# Patient Record
Sex: Male | Born: 1935 | Race: Black or African American | Hispanic: No | Smoking: Former smoker
Health system: Southern US, Community
[De-identification: ages and names within clinical notes are randomized; demographics above are authoritative.]

## PROBLEM LIST (undated history)

## (undated) DIAGNOSIS — H5440 Blindness, one eye, unspecified eye: Secondary | ICD-10-CM

## (undated) DIAGNOSIS — I10 Essential (primary) hypertension: Secondary | ICD-10-CM

## (undated) DIAGNOSIS — M1990 Unspecified osteoarthritis, unspecified site: Secondary | ICD-10-CM

## (undated) HISTORY — PX: OTHER SURGICAL HISTORY: SHX169

## (undated) HISTORY — PX: CARPAL TUNNEL RELEASE: SHX101

## (undated) HISTORY — PX: FINGER AMPUTATION: SHX636

---

## 2001-10-17 ENCOUNTER — Ambulatory Visit (HOSPITAL_COMMUNITY): Admission: RE | Admit: 2001-10-17 | Discharge: 2001-10-17 | Payer: Self-pay | Admitting: Pulmonary Disease

## 2003-02-12 ENCOUNTER — Ambulatory Visit (HOSPITAL_COMMUNITY): Admission: RE | Admit: 2003-02-12 | Discharge: 2003-02-12 | Payer: Self-pay | Admitting: General Surgery

## 2006-08-05 ENCOUNTER — Ambulatory Visit (HOSPITAL_COMMUNITY): Admission: RE | Admit: 2006-08-05 | Discharge: 2006-08-05 | Payer: Self-pay | Admitting: Family Medicine

## 2013-06-12 ENCOUNTER — Other Ambulatory Visit (HOSPITAL_COMMUNITY): Payer: Self-pay | Admitting: Family Medicine

## 2013-06-12 ENCOUNTER — Ambulatory Visit (HOSPITAL_COMMUNITY)
Admission: RE | Admit: 2013-06-12 | Discharge: 2013-06-12 | Disposition: A | Discharge status: Home / Self Care | Payer: Medicare Other | Source: Ambulatory Visit | Attending: Family Medicine | Admitting: Family Medicine

## 2013-06-12 DIAGNOSIS — I10 Essential (primary) hypertension: Secondary | ICD-10-CM | POA: Insufficient documentation

## 2013-06-12 DIAGNOSIS — Z87891 Personal history of nicotine dependence: Secondary | ICD-10-CM

## 2013-06-26 ENCOUNTER — Encounter (INDEPENDENT_AMBULATORY_CARE_PROVIDER_SITE_OTHER): Payer: Self-pay | Admitting: *Deleted

## 2013-07-11 ENCOUNTER — Other Ambulatory Visit (INDEPENDENT_AMBULATORY_CARE_PROVIDER_SITE_OTHER): Payer: Self-pay | Admitting: *Deleted

## 2013-07-11 ENCOUNTER — Telehealth (INDEPENDENT_AMBULATORY_CARE_PROVIDER_SITE_OTHER): Payer: Self-pay | Admitting: *Deleted

## 2013-07-11 ENCOUNTER — Encounter (INDEPENDENT_AMBULATORY_CARE_PROVIDER_SITE_OTHER): Payer: Self-pay

## 2013-07-11 ENCOUNTER — Encounter (INDEPENDENT_AMBULATORY_CARE_PROVIDER_SITE_OTHER): Payer: Self-pay | Admitting: *Deleted

## 2013-07-11 DIAGNOSIS — Z1211 Encounter for screening for malignant neoplasm of colon: Secondary | ICD-10-CM

## 2013-07-11 MED ORDER — PEG-KCL-NACL-NASULF-NA ASC-C 100 G PO SOLR: 1.0000 | Freq: Once | ORAL | Status: DC

## 2013-07-11 NOTE — Telephone Encounter (Signed)
Patient needs movi prep 

## 2013-08-27 ENCOUNTER — Telehealth (INDEPENDENT_AMBULATORY_CARE_PROVIDER_SITE_OTHER): Payer: Self-pay | Admitting: *Deleted

## 2013-08-27 NOTE — Telephone Encounter (Signed)
  Procedure: tcs  Reason/Indication:  screening  Has patient had this procedure before?  Yes, 10 yrs ago -- scanned  If so, when, by whom and where?    Is there a family history of colon cancer?  Not sure  Who?  What age when diagnosed?    Is patient diabetic?   no      Does patient have prosthetic heart valve?  no  Do you have a pacemaker?  no  Has patient ever had endocarditis? no  Has patient had joint replacement within last 12 months?  no  Does patient tend to be constipated or take laxatives? no  Is patient on Coumadin, Plavix and/or Aspirin? yes  Medications: asa 81 mg daily, lisinopril 20 mg daily, hctz 12.5 mg daily  Allergies: nkda  Medication Adjustment: asa 2 days  Procedure date & time: 09/19/13 at 830

## 2013-08-28 NOTE — Telephone Encounter (Signed)
agree

## 2013-09-06 ENCOUNTER — Encounter (HOSPITAL_COMMUNITY): Payer: Self-pay

## 2013-09-19 ENCOUNTER — Ambulatory Visit (HOSPITAL_COMMUNITY)
Admission: RE | Admit: 2013-09-19 | Discharge: 2013-09-19 | Disposition: A | Discharge status: Home / Self Care | Payer: Medicare Other | Source: Ambulatory Visit | Attending: Internal Medicine | Admitting: Internal Medicine

## 2013-09-19 ENCOUNTER — Encounter (HOSPITAL_COMMUNITY)
Admission: RE | Disposition: A | Discharge status: Home / Self Care | Payer: Self-pay | Source: Ambulatory Visit | Attending: Internal Medicine

## 2013-09-19 ENCOUNTER — Encounter (HOSPITAL_COMMUNITY): Payer: Self-pay | Admitting: *Deleted

## 2013-09-19 DIAGNOSIS — K644 Residual hemorrhoidal skin tags: Secondary | ICD-10-CM

## 2013-09-19 DIAGNOSIS — Z1211 Encounter for screening for malignant neoplasm of colon: Secondary | ICD-10-CM

## 2013-09-19 DIAGNOSIS — Z7982 Long term (current) use of aspirin: Secondary | ICD-10-CM | POA: Insufficient documentation

## 2013-09-19 DIAGNOSIS — Z79899 Other long term (current) drug therapy: Secondary | ICD-10-CM | POA: Insufficient documentation

## 2013-09-19 DIAGNOSIS — I10 Essential (primary) hypertension: Secondary | ICD-10-CM | POA: Insufficient documentation

## 2013-09-19 HISTORY — DX: Blindness, one eye, unspecified eye: H54.40

## 2013-09-19 HISTORY — PX: COLONOSCOPY: SHX5424

## 2013-09-19 HISTORY — DX: Unspecified osteoarthritis, unspecified site: M19.90

## 2013-09-19 HISTORY — DX: Essential (primary) hypertension: I10

## 2013-09-19 SURGERY — COLONOSCOPY
Anesthesia: Moderate Sedation

## 2013-09-19 MED ORDER — MIDAZOLAM HCL 5 MG/5ML IJ SOLN
INTRAMUSCULAR | Status: AC
  Filled 2013-09-19: qty 10

## 2013-09-19 MED ORDER — ATROPINE SULFATE 0.1 MG/ML IJ SOLN
INTRAMUSCULAR | Status: DC | PRN
  Administered 2013-09-19: 0.5 mg via INTRAVENOUS

## 2013-09-19 MED ORDER — MEPERIDINE HCL 50 MG/ML IJ SOLN
INTRAMUSCULAR | Status: AC
  Filled 2013-09-19: qty 1

## 2013-09-19 MED ORDER — MEPERIDINE HCL 50 MG/ML IJ SOLN
INTRAMUSCULAR | Status: DC | PRN
  Administered 2013-09-19: 25 mg via INTRAVENOUS

## 2013-09-19 MED ORDER — SODIUM CHLORIDE 0.9 % IV SOLN
INTRAVENOUS | Status: DC
  Administered 2013-09-19: 1000 mL via INTRAVENOUS

## 2013-09-19 MED ORDER — MIDAZOLAM HCL 5 MG/5ML IJ SOLN
INTRAMUSCULAR | Status: DC | PRN
  Administered 2013-09-19: 2 mg via INTRAVENOUS
  Administered 2013-09-19: 1 mg via INTRAVENOUS

## 2013-09-19 MED ORDER — SIMETHICONE 40 MG/0.6ML PO SUSP
ORAL | Status: DC | PRN
  Administered 2013-09-19: 09:00:00

## 2013-09-19 MED ORDER — ATROPINE SULFATE 1 MG/ML IJ SOLN
INTRAMUSCULAR | Status: AC
  Filled 2013-09-19: qty 1

## 2013-09-19 NOTE — Op Note (Signed)
COLONOSCOPY PROCEDURE REPORT  PATIENT:  Dustin Tucker  MR#:  [id number] Birthdate:  October 22, 1931, 80 y.o., male Endoscopist:  Dr. Rogene Houston, MD Referred By:  Dr. Starr Sinclair. Everette Rank, MD Procedure Date: 09/19/2013  Procedure:   Colonoscopy  Indications:  Patient is 80 year old African American male who is undergoing average risk screening colonoscopy. Last colonoscopy was in July, 2004.  Informed Consent:  The procedure and risks were reviewed with the patient and informed consent was obtained.  Medications:  Demerol 25 mg IV Versed 3 mg IV Atropine 0.5 mg IV for asymptomatic sinus bradycardia.  Description of procedure:  After a digital rectal exam was performed, that colonoscope was advanced from the anus through the rectum and colon to the area of the cecum, ileocecal valve and appendiceal orifice. The cecum was deeply intubated. These structures were well-seen and photographed for the record. From the level of the cecum and ileocecal valve, the scope was slowly and cautiously withdrawn. The mucosal surfaces were carefully surveyed utilizing scope tip to flexion to facilitate fold flattening as needed. The scope was pulled down into the rectum where a thorough exam including retroflexion was performed.  Findings:   Prep excellent. Normal mucosa of Fabius segments of colon and rectum. Small hemorrhoids below the dentate line.    Therapeutic/Diagnostic Maneuvers Performed:   None  Complications:  None  Cecal Withdrawal Time: 59minutes  Impression:  Normal colonoscopy except external hemorrhoids.  Recommendations:  Standard instructions given.    REHMAN,NAJEEB U  09/19/2013 9:07 AM  CC: Dr. Lanette Hampshire, MD & Dr. Rayne Du ref. provider found

## 2013-09-19 NOTE — Discharge Instructions (Signed)
Resume usual medications and diet. No driving for 24 hours.   Colonoscopy A colonoscopy is an exam to look at the entire large intestine (colon). This exam can help find problems such as tumors, polyps, inflammation, and areas of bleeding. The exam takes about 1 hour.  LET [facility] CARE PROVIDER KNOW ABOUT:   Any allergies you have.  All medicines you are taking, including vitamins, herbs, eye drops, creams, and over-the-counter medicines.  Previous problems you or members of your family have had with the use of anesthetics.  Any blood disorders you have.  Previous surgeries you have had.  Medical conditions you have. RISKS AND COMPLICATIONS  Generally, this is a safe procedure. However, as with any procedure, complications can occur. Possible complications include:  Bleeding.  Tearing or rupture of the colon wall.  Reaction to medicines given during the exam.  Infection (rare). BEFORE THE PROCEDURE   Ask your health care provider about changing or stopping your regular medicines.  You may be prescribed an oral bowel prep. This involves drinking a large amount of medicated liquid, starting the day before your procedure. The liquid will cause you to have multiple loose stools until your stool is almost clear or light green. This cleans out your colon in preparation for the procedure.  Do not eat or drink anything else once you have started the bowel prep, unless your health care provider tells you it is safe to do so.  Arrange for someone to drive you home after the procedure. PROCEDURE   You will be given medicine to help you relax (sedative).  You will lie on your side with your knees bent.  A long, flexible tube with a light and camera on the end (colonoscope) will be inserted through the rectum and into the colon. The camera sends video back to a computer screen as it moves through the colon. The colonoscope also releases carbon dioxide gas to inflate the colon. This  helps your health care provider see the area better.  During the exam, your health care provider may take a small tissue sample (biopsy) to be examined under a microscope if any abnormalities are found.  The exam is finished when the entire colon has been viewed. AFTER THE PROCEDURE   Do not drive for 24 hours after the exam.  You may have a small amount of blood in your stool.  You may pass moderate amounts of gas and have mild abdominal cramping or bloating. This is caused by the gas used to inflate your colon during the exam.  Ask when your test results will be ready and how you will get your results. Make sure you get your test results. Document Released: 07/23/2000 Document Revised: 05/16/2013 Document Reviewed: 04/02/2013 [facility] Patient Information 2014 [place].   Hemorrhoids Hemorrhoids are swollen veins around the rectum or anus. There are two types of hemorrhoids:   Internal hemorrhoids. These occur in the veins just inside the rectum. They may poke through to the outside and become irritated and painful.  External hemorrhoids. These occur in the veins outside the anus and can be felt as a painful swelling or hard lump near the anus. CAUSES  Pregnancy.   Obesity.   Constipation or diarrhea.   Straining to have a bowel movement.   Sitting for long periods on the toilet.  Heavy lifting or other activity that caused you to strain.  Anal intercourse. SYMPTOMS   Pain.   Anal itching or irritation.   Rectal bleeding.  Fecal leakage.   Anal swelling.   One or more lumps around the anus.  DIAGNOSIS  Your caregiver may be able to diagnose hemorrhoids by visual examination. Other examinations or tests that may be performed include:   Examination of the rectal area with a gloved hand (digital rectal exam).   Examination of anal canal using a small tube (scope).   A blood test if you have lost a significant amount of blood.  A test to  look inside the colon (sigmoidoscopy or colonoscopy). TREATMENT Most hemorrhoids can be treated at home. However, if symptoms do not seem to be getting better or if you have a lot of rectal bleeding, your caregiver may perform a procedure to help make the hemorrhoids get smaller or remove them completely. Possible treatments include:   Placing a rubber band at the base of the hemorrhoid to cut off the circulation (rubber band ligation).   Injecting a chemical to shrink the hemorrhoid (sclerotherapy).   Using a tool to burn the hemorrhoid (infrared light therapy).   Surgically removing the hemorrhoid (hemorrhoidectomy).   Stapling the hemorrhoid to block blood flow to the tissue (hemorrhoid stapling).  HOME CARE INSTRUCTIONS   Eat foods with fiber, such as whole grains, beans, nuts, fruits, and vegetables. Ask your doctor about taking products with added fiber in them (fibersupplements).  Increase fluid intake. Drink enough water and fluids to keep your urine clear or pale yellow.   Exercise regularly.   Go to the bathroom when you have the urge to have a bowel movement. Do not wait.   Avoid straining to have bowel movements.   Keep the anal area dry and clean. Use wet toilet paper or moist towelettes after a bowel movement.   Medicated creams and suppositories may be used or applied as directed.   Only take over-the-counter or prescription medicines as directed by your caregiver.   Take warm sitz baths for 15 20 minutes, 3 4 times a day to ease pain and discomfort.   Place ice packs on the hemorrhoids if they are tender and swollen. Using ice packs between sitz baths may be helpful.   Put ice in a plastic bag.   Place a towel between your skin and the bag.   Leave the ice on for 15 20 minutes, 3 4 times a day.   Do not use a donut-shaped pillow or sit on the toilet for long periods. This increases blood pooling and pain.  SEEK MEDICAL CARE IF:  You have  increasing pain and swelling that is not controlled by treatment or medicine.  You have uncontrolled bleeding.  You have difficulty or you are unable to have a bowel movement.  You have pain or inflammation outside the area of the hemorrhoids. MAKE SURE YOU:  Understand these instructions.  Will watch your condition.  Will get help right away if you are not doing well or get worse. Document Released: 07/23/2000 Document Revised: 07/12/2012 Document Reviewed: 05/30/2012 [facility] Patient Information 2014 [place].

## 2013-09-19 NOTE — H&P (Signed)
Dustin Tucker is an 80 y.o. male.   Chief Complaint: Patient is here for colonoscopy. HPI: Patient is 80 year old African American male who is here for screening colonoscopy. He denies change in bowel habits rectal bleeding or abdominal pain. His last colonoscopy was in July 2004. Family history is positive for prostate carcinoma and one brother who is in his late 72s another brother was diagnosed with metastatic colon cancer but no further details available.  Past Medical History  Diagnosis Date  . Hypertension   . Arthritis     hands  . 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 Carpal tunnel release Right   . Finger amputation Left     4th and 5th  . Left eye blindness Left     Family History  Problem Relation Age of Onset  . CAD Mother   . CAD Father    Social History:  reports that he quit smoking about 34 years ago. His smoking use included Cigarettes. He smoked 0.50 packs per day. He does not have any smokeless tobacco history on file. He reports that he does not drink alcohol or use illicit drugs.  Allergies:  Allergies  Allergen Reactions  . Aspirin Other (See Comments)    Gi upset, can tolerate baby aspirin    Medications Prior to Admission  Medication Sig Dispense Refill  . aspirin EC 81 MG tablet Take 81 mg by mouth daily.      . hydrochlorothiazide (MICROZIDE) 12.5 MG capsule Take 12.5 mg by mouth daily.      Marland Kitchen lisinopril (PRINIVIL,ZESTRIL) 20 MG tablet Take 20 mg by mouth daily.        No results found for this or any previous visit (from the past 48 hour(s)). No results found.  ROS  Blood pressure 185/88, pulse 50, temperature 97.5 F (36.4 C), temperature source Oral, resp. rate 18, height 5\' 9"  (1.753 m), weight 172 lb (78.019 kg), SpO2 97.00%. Physical Exam  Constitutional: He appears well-developed and well-nourished.  HENT:  Mouth/Throat: Oropharynx is clear and moist.  Eyes: No scleral  icterus.  Patient is blind in left eye  Neck: No thyromegaly present.  Cardiovascular: Normal rate, regular rhythm and normal heart sounds.   No murmur heard. Respiratory: Effort normal and breath sounds normal.  GI: Soft. He exhibits no distension and no mass. There is no tenderness.  Musculoskeletal: He exhibits no edema.  Fourth and fifth finger in left hand have been amputated  Lymphadenopathy:    He has no cervical adenopathy.  Neurological: He is alert.  Skin: Skin is warm and dry.     Assessment/Plan Average risk screening colonoscopy.  [name] U 09/19/2013, 8:32 AM

## 2013-09-21 ENCOUNTER — Encounter (HOSPITAL_COMMUNITY): Payer: Self-pay | Admitting: Internal Medicine

## 2013-10-22 ENCOUNTER — Other Ambulatory Visit (HOSPITAL_COMMUNITY): Payer: Self-pay | Admitting: Family Medicine

## 2013-10-22 DIAGNOSIS — Z136 Encounter for screening for cardiovascular disorders: Secondary | ICD-10-CM

## 2013-10-22 DIAGNOSIS — I10 Essential (primary) hypertension: Secondary | ICD-10-CM

## 2013-10-26 ENCOUNTER — Ambulatory Visit (HOSPITAL_COMMUNITY)
Admission: RE | Admit: 2013-10-26 | Discharge: 2013-10-26 | Disposition: A | Discharge status: Home / Self Care | Payer: Medicare Other | Source: Ambulatory Visit | Attending: Family Medicine | Admitting: Family Medicine

## 2013-10-26 DIAGNOSIS — Z136 Encounter for screening for cardiovascular disorders: Secondary | ICD-10-CM

## 2013-10-26 DIAGNOSIS — I10 Essential (primary) hypertension: Secondary | ICD-10-CM

## 2013-10-26 DIAGNOSIS — Z1389 Encounter for screening for other disorder: Secondary | ICD-10-CM | POA: Insufficient documentation

## 2014-06-11 ENCOUNTER — Ambulatory Visit (INDEPENDENT_AMBULATORY_CARE_PROVIDER_SITE_OTHER): Payer: Medicare Other | Admitting: Urology

## 2014-06-11 DIAGNOSIS — S2096XA Insect bite (nonvenomous) of unspecified parts of thorax, initial encounter: Secondary | ICD-10-CM

## 2014-06-11 DIAGNOSIS — N529 Male erectile dysfunction, unspecified: Secondary | ICD-10-CM

## 2014-06-11 DIAGNOSIS — R972 Elevated prostate specific antigen [PSA]: Secondary | ICD-10-CM

## 2014-06-28 ENCOUNTER — Other Ambulatory Visit: Payer: Self-pay | Admitting: Urology

## 2014-06-28 DIAGNOSIS — R972 Elevated prostate specific antigen [PSA]: Secondary | ICD-10-CM

## 2014-08-02 ENCOUNTER — Other Ambulatory Visit: Payer: Self-pay | Admitting: Urology

## 2014-08-02 DIAGNOSIS — R972 Elevated prostate specific antigen [PSA]: Secondary | ICD-10-CM

## 2014-08-13 ENCOUNTER — Ambulatory Visit (HOSPITAL_COMMUNITY)
Admission: RE | Admit: 2014-08-13 | Discharge: 2014-08-13 | Disposition: A | Discharge status: Home / Self Care | Payer: Medicare Other | Source: Ambulatory Visit | Attending: Urology | Admitting: Urology

## 2014-08-13 DIAGNOSIS — R972 Elevated prostate specific antigen [PSA]: Secondary | ICD-10-CM | POA: Insufficient documentation

## 2014-08-13 MED ORDER — LIDOCAINE HCL (PF) 2 % IJ SOLN: 10.0000 mL | Freq: Once | INTRAMUSCULAR | Status: DC

## 2014-08-13 MED ORDER — GENTAMICIN SULFATE 40 MG/ML IJ SOLN
160.0000 mg | Freq: Once | INTRAMUSCULAR | Status: AC
  Administered 2014-08-13: 160 mg via INTRAMUSCULAR

## 2014-08-13 MED ORDER — LIDOCAINE HCL (PF) 2 % IJ SOLN
INTRAMUSCULAR | Status: AC
  Filled 2014-08-13: qty 10

## 2014-08-13 MED ORDER — GENTAMICIN SULFATE 40 MG/ML IJ SOLN
INTRAMUSCULAR | Status: AC
Start: 2014-08-13 — End: 2014-08-13
  Administered 2014-08-13: 160 mg via INTRAMUSCULAR
  Filled 2014-08-13: qty 4

## 2014-08-13 NOTE — Discharge Instructions (Signed)
Transrectal Ultrasound-Guided Biopsy °A transrectal ultrasound-guided biopsy is a procedure to remove samples of tissue from your prostate using ultrasound images to guide the procedure. The procedure is usually done to evaluate the prostate gland of men who have an elevated prostate-specific antigen (PSA). PSA is a blood test to screen for prostate cancer. The biopsy samples are taken to check for prostate cancer.  °LET YOUR HEALTH CARE PROVIDER KNOW ABOUT: °· Any allergies you have. °· All medicines you are taking, including vitamins, herbs, eye drops, creams, and over-the-counter medicines. °· Previous problems you or members of your family have had with the use of anesthetics. °· Any blood disorders you have. °· Previous surgeries you have had. °· Medical conditions you have. °RISKS AND COMPLICATIONS °Generally, this is a safe procedure. However, as with any procedure, problems can occur. Possible problems include: °· Infection of your prostate. °· Bleeding from your rectum or blood in your urine. °· Difficulty urinating. °· Nerve damage (this is usually temporary). °· Damage to surrounding structures such as blood vessels, organs, and muscles, which would require other procedures. °BEFORE THE PROCEDURE °· Do not eat or drink anything after midnight on the night before the procedure or as directed by your health care provider. °· Take medicines only as directed by your health care provider. °· Your health care provider may have you stop taking certain medicines 5-7 days before the procedure. °· You will be given an enema before the procedure. During an enema, a liquid is injected into your rectum to clear out waste. °· You may have lab tests the day of your procedure.   °· Plan to have someone take you home after the procedure. °PROCEDURE  °· You will be given medicine to help you relax (sedative) before the procedure. An IV tube will be inserted into one of your veins and used to give fluids and  medicine. °· You will be given antibiotic medicine to reduce the risk of an infection. °· You will be placed on your side for the procedure. °· A probe with lubricated gel will be placed into your rectum, and images will be taken of your prostate and surrounding structures. °· Numbing medicine will be injected into the prostate before the biopsy samples are taken. °· A biopsy needle will then be inserted and guided to your prostate with the use of the ultrasound images. °· Samples of prostate tissue will be taken, and the needle will then be removed. °· The biopsy samples will be sent to a lab to be analyzed. Results are usually back in 2-3 days. °AFTER THE PROCEDURE °· You will be taken to a recovery area where you will be monitored. °· You may have some discomfort in the rectal area. You will be given pain medicines to control this. °· You may be allowed to go home the same day, or you may need to stay in the hospital overnight. °Document Released: 12/10/2013 Document Reviewed: 03/14/2013 °[employer]® Patient Information ©2015 [employer]. This information is not intended to replace advice given to you by your health care provider. Make sure you discuss any questions you have with your health care provider. ° °

## 2014-08-24 ENCOUNTER — Encounter (HOSPITAL_COMMUNITY): Payer: Self-pay | Admitting: *Deleted

## 2014-08-24 ENCOUNTER — Emergency Department (HOSPITAL_COMMUNITY)
Admission: EM | Admit: 2014-08-24 | Discharge: 2014-08-24 | Disposition: A | Discharge status: Home / Self Care | Payer: Medicare Other | Attending: Emergency Medicine | Admitting: Emergency Medicine

## 2014-08-24 DIAGNOSIS — R338 Other retention of urine: Secondary | ICD-10-CM

## 2014-08-24 DIAGNOSIS — Z79899 Other long term (current) drug therapy: Secondary | ICD-10-CM | POA: Diagnosis not present

## 2014-08-24 DIAGNOSIS — R339 Retention of urine, unspecified: Secondary | ICD-10-CM | POA: Diagnosis present

## 2014-08-24 DIAGNOSIS — N4889 Other specified disorders of penis: Secondary | ICD-10-CM | POA: Insufficient documentation

## 2014-08-24 DIAGNOSIS — H5442 Blindness, left eye, normal vision right eye: Secondary | ICD-10-CM | POA: Diagnosis not present

## 2014-08-24 DIAGNOSIS — I10 Essential (primary) hypertension: Secondary | ICD-10-CM | POA: Diagnosis not present

## 2014-08-24 DIAGNOSIS — M1990 Unspecified osteoarthritis, unspecified site: Secondary | ICD-10-CM | POA: Diagnosis not present

## 2014-08-24 DIAGNOSIS — Z87891 Personal history of nicotine dependence: Secondary | ICD-10-CM | POA: Diagnosis not present

## 2014-08-24 DIAGNOSIS — Z7982 Long term (current) use of aspirin: Secondary | ICD-10-CM | POA: Insufficient documentation

## 2014-08-24 LAB — URINALYSIS, ROUTINE W REFLEX MICROSCOPIC
Bilirubin Urine: NEGATIVE
Glucose, UA: NEGATIVE mg/dL
Ketones, ur: NEGATIVE mg/dL
Nitrite: NEGATIVE
Protein, ur: NEGATIVE mg/dL
SPECIFIC GRAVITY, URINE: 1.015 (ref 1.005–1.030)
Urobilinogen, UA: 0.2 mg/dL (ref 0.0–1.0)
pH: 6 (ref 5.0–8.0)

## 2014-08-24 LAB — URINE MICROSCOPIC-ADD ON

## 2014-08-24 NOTE — ED Provider Notes (Signed)
CSN: [id number]     Arrival date & time 08/24/14  1615 History  This chart was scribed for Dustin Muskrat, MD by [facility], ED Scribe. The patient was seen in APA01/[facility]01 and the patient's care was started at 4:59 PM.  Chief Complaint  Patient presents with  . Urinary Retention   The history is provided by the patient. No language interpreter was used.    HPI Comments: TREMONT GAVITT is a 81 y.o. male who presents to the Emergency Department complaining of urinary retention onset last night. Pt reports he has only been able to void small amounts of urine. When he does void he notes it is more blood than urine. He has penile pain as associated symptoms. Had a prostate biopsy test 2 weeks ago. He denies CP, SOB, confusion, syncope, abdominal pain, fever, weakness, and dizziness. Pt is otherwise healthy.   Past Medical History  Diagnosis Date  . Hypertension   . Arthritis     hands  . 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 Carpal tunnel release Right   . Finger amputation Left     4th and 5th  . Left eye blindness Left   . Colonoscopy N/A 09/19/2013    Procedure: COLONOSCOPY;  Surgeon: Rogene Houston, MD;  Location: [facility];  Service: Endoscopy;  Laterality: N/A;  8   Family History  Problem Relation Age of Onset  . CAD Mother   . CAD Father    History  Substance Use Topics  . Smoking status: Former Smoker -- 0.50 packs/day    Types: Cigarettes    Quit date: 08/10/1979  . Smokeless tobacco: Not on file  . Alcohol Use: No    Review of Systems  Constitutional: Negative for fever.       Per HPI, otherwise negative  HENT:       Per HPI, otherwise negative  Respiratory: Negative for shortness of breath.        Per HPI, otherwise negative  Cardiovascular: Negative for chest pain.       Per HPI, otherwise negative  Gastrointestinal: Negative for vomiting and abdominal pain.  Endocrine:       Negative  aside from HPI  Genitourinary: Positive for hematuria, difficulty urinating and penile pain.       Neg aside from HPI   Musculoskeletal:       Per HPI, otherwise negative  Skin: Negative.   Neurological: Negative for dizziness, syncope and weakness.    Allergies  Aspirin  Home Medications   Prior to Admission medications   Medication Sig Start Date End Date Taking? Authorizing Provider  Flaxseed, Linseed, (FLAX SEEDS PO) Take 1 capsule by mouth daily.   Yes Historical Provider, MD  hydrochlorothiazide (MICROZIDE) 12.5 MG capsule Take 12.5 mg by mouth daily.   Yes Historical Provider, MD  ibuprofen (ADVIL,MOTRIN) 200 MG tablet Take 200 mg by mouth every 6 (six) hours as needed for mild pain.   Yes Historical Provider, MD  lisinopril (PRINIVIL,ZESTRIL) 20 MG tablet Take 20 mg by mouth 2 (two) times daily.    Yes Historical Provider, MD  tetrahydrozoline 0.05 % ophthalmic solution Place 1 drop into both eyes as needed (Dry Eyes).   Yes Historical Provider, MD  aspirin EC 81 MG tablet Take 81 mg by mouth daily.    Historical Provider, MD   BP 182/85 mmHg  Pulse 102  Temp(Src) 98.7  F (37.1 C) (Oral)  Resp 18  Ht 5\' 9"  (1.753 m)  Wt 172 lb (78.019 kg)  BMI 25.39 kg/m2  SpO2 100% Physical Exam  Constitutional: He is oriented to person, place, and time. He appears well-developed. No distress.  HENT:  Head: Normocephalic and atraumatic.  Eyes: Conjunctivae and EOM are normal.  Cardiovascular: Normal rate, regular rhythm and normal heart sounds.   Pulmonary/Chest: Effort normal and breath sounds normal. No stridor. No respiratory distress.  Abdominal: He exhibits no distension.  Genitourinary:  No ongoing bleeding, no penile swelling and not TTP.  Musculoskeletal: He exhibits no edema.  Neurological: He is alert and oriented to person, place, and time.  Skin: Skin is warm and dry.  Psychiatric: He has a normal mood and affect.  Nursing note and vitals reviewed.   ED Course   Procedures  DIAGNOSTIC STUDIES: Oxygen Saturation is 100% on room air, normal by my interpretation.    COORDINATION OF CARE: 5:03 PM Discussed treatment plan with pt at bedside and pt agreed to plan.  Labs reviewed  6:23 PM Patient in no distress.  Urine flowing in the catheter bag. He will follow-up with urology in 2 days.   MDM    I personally performed the services described in this documentation, which was scribed in my presence. The recorded information has been reviewed and is accurate.   Patient presents with ongoing hematuria, decreased urine output after recent prostate biopsy. No evidence for infection.  Patient had resumption of urine flow after placement of Foley catheter, which was well tolerated.     Dustin Muskrat, MD 08/24/14 [phone]

## 2014-08-24 NOTE — ED Notes (Signed)
Pt given a clean standard drainage bag, leg bag applied by NT. Pt educated on how to switch from leg bag to standard drainage bag. Pt verbalized understanding via teach back.

## 2014-08-24 NOTE — ED Notes (Signed)
Urinary catheter secured by UC strip. Pt tolerated well.

## 2014-08-24 NOTE — ED Notes (Signed)
C/o problems passing urine, states he has only been able to void a small amount since yesterday and he is passing blood

## 2014-08-24 NOTE — Discharge Instructions (Signed)
As discussed, it is important that you follow up as soon as possible with your physician for continued management of your condition. ° °If you develop any new, or concerning changes in your condition, please return to the emergency department immediately. ° °

## 2014-09-03 ENCOUNTER — Ambulatory Visit (INDEPENDENT_AMBULATORY_CARE_PROVIDER_SITE_OTHER): Payer: Medicare Other | Admitting: Urology

## 2014-09-03 DIAGNOSIS — R972 Elevated prostate specific antigen [PSA]: Secondary | ICD-10-CM

## 2014-09-03 DIAGNOSIS — R339 Retention of urine, unspecified: Secondary | ICD-10-CM

## 2014-09-03 DIAGNOSIS — N529 Male erectile dysfunction, unspecified: Secondary | ICD-10-CM

## 2014-12-13 ENCOUNTER — Encounter: Payer: Self-pay | Admitting: Cardiovascular Disease

## 2014-12-13 ENCOUNTER — Ambulatory Visit (INDEPENDENT_AMBULATORY_CARE_PROVIDER_SITE_OTHER): Payer: Medicare Other | Admitting: Cardiovascular Disease

## 2014-12-13 ENCOUNTER — Encounter: Payer: Self-pay | Admitting: *Deleted

## 2014-12-13 VITALS — BP 150/90 | HR 90 | Ht 69.0 in | Wt 172.2 lb

## 2014-12-13 DIAGNOSIS — R001 Bradycardia, unspecified: Secondary | ICD-10-CM | POA: Diagnosis not present

## 2014-12-13 DIAGNOSIS — R55 Syncope and collapse: Secondary | ICD-10-CM | POA: Diagnosis not present

## 2014-12-13 DIAGNOSIS — R070 Pain in throat: Secondary | ICD-10-CM | POA: Diagnosis not present

## 2014-12-13 DIAGNOSIS — R002 Palpitations: Secondary | ICD-10-CM

## 2014-12-13 DIAGNOSIS — R42 Dizziness and giddiness: Secondary | ICD-10-CM

## 2014-12-13 NOTE — Progress Notes (Signed)
Patient ID: Dustin Tucker, male   DOB: September 30, 1931, 81 y.o.   MRN: [id number]       CARDIOLOGY CONSULT NOTE  Patient ID: Dustin Tucker MRN: [id number] DOB/AGE: [birth year] 81 y.o.  Admit date: (Not on file) Primary Physician Lanette Hampshire, MD  Reason for Consultation: dizziness and near syncope  HPI: The patient is an 81 year old male with a history of hypertension who is referred for dizziness and near-syncope. He was evaluated on 4/19 by his PCP and mentioned an episode of dizziness the day before where he "passed out for a few seconds". He also had concomitant throat pain as well as a headache. ECG demonstrated sinus bradycardia with PACs. There was a diffuse nonspecific T wave abnormality and ST abnormality.  He denies a history of chest pain but has occasional throat pain which is fairly significant which he cannot explain. He denies dysphagia for solids or liquids. He also denies exertional dyspnea, orthopnea, and leg swelling. He has fairly frequent palpitations. He describes the forementioned pre-sycnopal episode in adequate detail. He had been kneeling down working on something at his house and then stood up and began walking and suddenly felt dizzy and then almost passed out. He denies a prior history of myocardial infarction and stroke.    Allergies  Allergen Reactions  . Aspirin Other (See Comments)    Gi upset, can tolerate baby aspirin    Current Outpatient Prescriptions  Medication Sig Dispense Refill  . aspirin EC 81 MG tablet Take 81 mg by mouth daily.    . Flaxseed, Linseed, (FLAX SEEDS PO) Take 1 capsule by mouth daily.    . hydrochlorothiazide (MICROZIDE) 12.5 MG capsule Take 12.5 mg by mouth daily.    Marland Kitchen ibuprofen (ADVIL,MOTRIN) 200 MG tablet Take 200 mg by mouth every 6 (six) hours as needed for mild pain.    Marland Kitchen lisinopril (PRINIVIL,ZESTRIL) 20 MG tablet Take 20 mg by mouth 2 (two) times daily.     Marland Kitchen tetrahydrozoline 0.05 % ophthalmic solution Place 1 drop into  both eyes as needed (Dry Eyes).     No current facility-administered medications for this visit.    Past Medical History  Diagnosis Date  . Hypertension   . Arthritis     hands  . 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 Carpal tunnel release Right   . Finger amputation Left     4th and 5th  . Left eye blindness Left   . Colonoscopy N/A 09/19/2013    Procedure: COLONOSCOPY;  Surgeon: Rogene Houston, MD;  Location: [facility];  Service: Endoscopy;  Laterality: N/A;  830    History   Social History  . Marital Status: Married    Spouse Name: N/A  . Number of Children: N/A  . Years of Education: N/A   Occupational History  . Not on file.   Social History Main Topics  . Smoking status: Former Smoker -- 0.50 packs/day    Types: Cigarettes    Quit date: 08/10/1979  . Smokeless tobacco: Not on file  . Alcohol Use: No  . Drug Use: No  . Sexual Activity: Not on file   Other Topics Concern  . Not on file   Social History Narrative     No family history of premature CAD in 1st degree relatives.  Prior to Admission medications   Medication Sig Start Date End Date Taking? Authorizing Provider  aspirin EC 81 MG tablet Take 81 mg by mouth daily.    Historical Provider, MD  Flaxseed, Linseed, (FLAX SEEDS PO) Take 1 capsule by mouth daily.    Historical Provider, MD  hydrochlorothiazide (MICROZIDE) 12.5 MG capsule Take 12.5 mg by mouth daily.    Historical Provider, MD  ibuprofen (ADVIL,MOTRIN) 200 MG tablet Take 200 mg by mouth every 6 (six) hours as needed for mild pain.    Historical Provider, MD  lisinopril (PRINIVIL,ZESTRIL) 20 MG tablet Take 20 mg by mouth 2 (two) times daily.     Historical Provider, MD  tetrahydrozoline 0.05 % ophthalmic solution Place 1 drop into both eyes as needed (Dry Eyes).    Historical Provider, MD     Review of systems complete and found to be negative unless listed above in  HPI     Physical exam There were no vitals taken for this visit. General: NAD Neck: No JVD, no thyromegaly or thyroid nodule.  Lungs: Clear to auscultation bilaterally with normal respiratory effort. CV: Nondisplaced PMI. Regular rate and rhythm, normal S1/S2, no S3/S4, no murmur.  No peripheral edema.  No carotid bruit.  Normal pedal pulses.  Abdomen: Soft, nontender, no hepatosplenomegaly, no distention.  Skin: Intact without lesions or rashes.  Neurologic: Alert and oriented x 3.  Psych: Normal affect. Extremities: No clubbing or cyanosis.  HEENT: Normal.   ECG: Most recent ECG reviewed.  Labs:  No results found for: WBC, HGB, HCT, MCV, PLT No results for input(s): NA, K, CL, CO2, BUN, CREATININE, CALCIUM, PROT, BILITOT, ALKPHOS, ALT, AST, GLUCOSE in the last 168 hours.  Invalid input(s): LABALBU No results found for: CKTOTAL, CKMB, CKMBINDEX, TROPONINI No results found for: CHOL No results found for: HDL No results found for: LDLCALC No results found for: TRIG No results found for: CHOLHDL No results found for: LDLDIRECT       Studies: No results found.  ASSESSMENT AND PLAN:  1. Near syncope, dizziness, and palpitations with associated throat pain: Unclear if this is related to a bradyarrhythmia as sinus bradycardia was noted on his prior ECG. Will obtain a two-week event monitor. Given his associated throat pain, it is unclear if this may represent an anginal equivalent. Will obtain a Lexiscan Cardiolite for further clarification.  Dispo: f/u 1 month  Signed: Kate Sable, M.D., F.A.C.C.  12/13/2014, 2:40 PM

## 2014-12-13 NOTE — Addendum Note (Signed)
Addended by: Levonne Hubert on: 12/13/2014 03:12 PM   Modules accepted: Orders, Level of Service

## 2014-12-13 NOTE — Patient Instructions (Addendum)
Your physician recommends that you schedule a follow-up appointment in: 1 month with Dr. Bronson Ing  Your physician recommends that you continue on your current medications as directed. Please refer to the Current Medication list given to you today.  Your physician has requested that you have a lexiscan myoview. For further information please visit [email or web address]. Please follow instruction sheet, as given.  Your physician has recommended that you wear an event monitor. Event monitors are medical devices that record the heart's electrical activity. Doctors most often [place] these monitors to diagnose arrhythmias. Arrhythmias are problems with the speed or rhythm of the heartbeat. The monitor is a small, portable device. You can wear one while you do your normal daily activities. This is usually used to diagnose what is causing palpitations/syncope (passing out).   Thank you for choosing [facility]!

## 2014-12-18 ENCOUNTER — Ambulatory Visit: Payer: Medicare Other

## 2014-12-23 ENCOUNTER — Encounter (HOSPITAL_COMMUNITY): Payer: Medicare Other

## 2015-01-16 ENCOUNTER — Encounter: Payer: Self-pay | Admitting: Cardiovascular Disease

## 2015-01-16 ENCOUNTER — Ambulatory Visit (INDEPENDENT_AMBULATORY_CARE_PROVIDER_SITE_OTHER): Payer: Medicare Other | Admitting: Cardiovascular Disease

## 2015-01-16 VITALS — BP 144/78 | HR 74 | Ht 69.5 in | Wt 170.2 lb

## 2015-01-16 DIAGNOSIS — R0789 Other chest pain: Secondary | ICD-10-CM | POA: Diagnosis not present

## 2015-01-16 DIAGNOSIS — R070 Pain in throat: Secondary | ICD-10-CM

## 2015-01-16 DIAGNOSIS — R55 Syncope and collapse: Secondary | ICD-10-CM | POA: Diagnosis not present

## 2015-01-16 DIAGNOSIS — R001 Bradycardia, unspecified: Secondary | ICD-10-CM | POA: Diagnosis not present

## 2015-01-16 DIAGNOSIS — R002 Palpitations: Secondary | ICD-10-CM

## 2015-01-16 DIAGNOSIS — R42 Dizziness and giddiness: Secondary | ICD-10-CM

## 2015-01-16 DIAGNOSIS — R072 Precordial pain: Secondary | ICD-10-CM

## 2015-01-16 NOTE — Patient Instructions (Signed)
Your physician recommends that you schedule a follow-up appointment in: 2 months with Dr Bronson Ing   Your physician has requested that you have a lexiscan myoview. For further information please visit [email or web address]. Please follow instruction sheet, as given.  Your physician has recommended that you wear an event monitor. Event monitors are medical devices that record the heart's electrical activity. Doctors most often [place] these monitors to diagnose arrhythmias. Arrhythmias are problems with the speed or rhythm of the heartbeat. The monitor is a small, portable device. You can wear one while you do your normal daily activities. This is usually used to diagnose what is causing palpitations/syncope (passing out).  Your physician recommends that you continue on your current medications as directed. Please refer to the Current Medication list given to you today.     Thank you for choosing [facility] !

## 2015-01-16 NOTE — Progress Notes (Signed)
Patient ID: Dustin Tucker, male   DOB: [birth year], 81 y.o.   MRN: [id number]      SUBJECTIVE: The patient returns for follow-up after undergoing cardiovascular testing performed for the evaluation of dizziness, near syncope, and throat pain.  He canceled his stress test and wanted to reschedule it because he had something going on. He only wore the event monitor for 3 days due to some technical difficulties. He denies any recent near syncope or dizziness. He has had 2 episodes of throat tightness with intermittent chest tightness since his last visit. He does not have a cell phone but does have a home phone with an answering machine, but does not recall if he got any messages from the event monitoring company.  For the 3 days that he actually wore the event monitor, there was one episode of long RP tachycardia, possibly sinus tachycardia vs SVT, heart rate 172 bpm. He was asymptomatic.   He said he works outside quite a bit and [employer], works on his tractor, gardens, and uses the [employer].      Review of Systems: As per "subjective", otherwise negative.  Allergies  Allergen Reactions  . Aspirin Other (See Comments)    Gi upset, can tolerate baby aspirin    Current Outpatient Prescriptions  Medication Sig Dispense Refill  . aspirin EC 81 MG tablet Take 81 mg by mouth daily.    . Flaxseed, Linseed, (FLAX SEEDS PO) Take 1 capsule by mouth daily.    Marland Kitchen lisinopril (PRINIVIL,ZESTRIL) 20 MG tablet Take 20 mg by mouth 2 (two) times daily.      No current facility-administered medications for this visit.    Past Medical History  Diagnosis Date  . Hypertension   . Arthritis     hands  . 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 Carpal tunnel release Right   . Finger amputation Left     4th and 5th  . Left eye blindness Left   . Colonoscopy N/A 09/19/2013    Procedure: COLONOSCOPY;  Surgeon: Rogene Houston, MD;  Location[facility];  Service: Endoscopy;  Laterality: N/A;  830    History   Social History  . Marital Status: Married    Spouse Name: N/A  . Number of Children: N/A  . Years of Education: N/A   Occupational History  . Not on file.   Social History Main Topics  . Smoking status: Former Smoker -- 0.50 packs/day    Types: Cigarettes    Start date: 08/09/1956    Quit date: 08/10/1979  . Smokeless tobacco: Not on file  . Alcohol Use: No  . Drug Use: No  . Sexual Activity: Not on file   Other Topics Concern  . Not on file   Social History Narrative     Filed Vitals:   01/16/15 0838  BP: 144/78  Pulse: 74  Height: 5' 9.5" (1.765 m)  Weight: 170 lb 3.2 oz (77.202 kg)  SpO2: 98%    PHYSICAL EXAM General: NAD HEENT: Normal. Neck: No JVD, no thyromegaly. Lungs: Clear to auscultation bilaterally with normal respiratory effort. CV: Nondisplaced PMI.  Regular rate and rhythm, normal S1/S2, no S3/S4, no murmur. No pretibial or periankle edema.  No carotid bruit.  Normal pedal pulses.  Abdomen: Soft, nontender, no hepatosplenomegaly, no distention.  Neurologic: Alert and oriented x 3.  Psych: Normal affect. Skin: Normal. Musculoskeletal: Normal  range of motion, no gross deformities. Extremities: No clubbing or cyanosis.   ECG: Most recent ECG reviewed.      ASSESSMENT AND PLAN: 1. Near syncope, dizziness, and palpitations with associated throat pain and chest tightness: No recurrences of near syncope since last visit. Has had throat and chest tightness.  Unclear if dizziness and near syncope was related to a bradyarrhythmia as sinus bradycardia was noted on his prior ECG. Did have long RP tachycardia which was asymptomatic and probably sinus, although SVT could not entirely be excluded. Will again try and obtain a two-week event monitor. Given his associated throat pain and chest tightness, it is unclear if this may represent an anginal equivalent. Will again try to obtain a  Lexiscan Cardiolite for further clarification.  Dispo: f/u 2 months.   Kate Sable, M.D., F.A.C.C.

## 2015-01-22 ENCOUNTER — Telehealth: Payer: Self-pay | Admitting: *Deleted

## 2015-01-22 NOTE — Telephone Encounter (Signed)
[employer] called to report a serious event on Dustin Tucker of SVT with a HR of 174 lasting 30 sec. To one min. [employer] stated that they were unable to reach the patient. I called the patient and left a voicemail for him to call the office. When the patient returned the call he stated at the time in question that he felt a "chill" that lasted about a min. Serious report shown to Dr. Domenic Polite and sent to be scanned.

## 2015-01-23 ENCOUNTER — Encounter (HOSPITAL_COMMUNITY): Admission: RE | Admit: 2015-01-23 | Payer: Medicare Other | Source: Ambulatory Visit

## 2015-01-23 ENCOUNTER — Encounter (HOSPITAL_COMMUNITY)
Admission: RE | Admit: 2015-01-23 | Discharge: 2015-01-23 | Disposition: A | Discharge status: Home / Self Care | Payer: Medicare Other | Source: Ambulatory Visit | Attending: Cardiovascular Disease | Admitting: Cardiovascular Disease

## 2015-01-23 ENCOUNTER — Inpatient Hospital Stay (HOSPITAL_COMMUNITY): Admission: RE | Admit: 2015-01-23 | Payer: Medicare Other | Source: Ambulatory Visit

## 2015-01-23 DIAGNOSIS — R070 Pain in throat: Secondary | ICD-10-CM

## 2015-01-23 DIAGNOSIS — R072 Precordial pain: Secondary | ICD-10-CM

## 2015-01-23 DIAGNOSIS — R55 Syncope and collapse: Secondary | ICD-10-CM

## 2015-01-23 DIAGNOSIS — R002 Palpitations: Secondary | ICD-10-CM

## 2015-01-28 ENCOUNTER — Encounter (HOSPITAL_COMMUNITY)
Admission: RE | Admit: 2015-01-28 | Discharge: 2015-01-28 | Disposition: A | Discharge status: Home / Self Care | Payer: Medicare Other | Source: Ambulatory Visit | Attending: Cardiovascular Disease | Admitting: Cardiovascular Disease

## 2015-01-28 ENCOUNTER — Inpatient Hospital Stay (HOSPITAL_COMMUNITY): Admission: RE | Admit: 2015-01-28 | Payer: Medicare Other | Source: Ambulatory Visit

## 2015-01-28 ENCOUNTER — Encounter (HOSPITAL_COMMUNITY): Payer: Self-pay

## 2015-01-28 DIAGNOSIS — R55 Syncope and collapse: Secondary | ICD-10-CM | POA: Insufficient documentation

## 2015-01-28 DIAGNOSIS — R072 Precordial pain: Secondary | ICD-10-CM | POA: Diagnosis not present

## 2015-01-28 DIAGNOSIS — R079 Chest pain, unspecified: Secondary | ICD-10-CM | POA: Diagnosis not present

## 2015-01-28 LAB — NM MYOCAR MULTI W/SPECT W/WALL MOTION / EF
CHL CUP NUCLEAR SSS: 2
LV dias vol: 96 mL
LVSYSVOL: 34 mL
NUC STRESS TID: 1.16
Peak HR: 94 {beats}/min
RATE: 0.29
Rest HR: 51 {beats}/min
SDS: 0
SRS: 2

## 2015-01-28 MED ORDER — SODIUM CHLORIDE 0.9 % IJ SOLN
INTRAMUSCULAR | Status: AC
  Administered 2015-01-28: 10 mL via INTRAVENOUS
  Filled 2015-01-28: qty 3

## 2015-01-28 MED ORDER — REGADENOSON 0.4 MG/5ML IV SOLN
INTRAVENOUS | Status: AC
  Administered 2015-01-28: 0.4 mg via INTRAVENOUS
  Filled 2015-01-28: qty 5

## 2015-01-28 MED ORDER — TECHNETIUM TC 99M SESTAMIBI - CARDIOLITE
30.0000 | Freq: Once | INTRAVENOUS | Status: AC | PRN
  Administered 2015-01-28: 11:00:00 30 via INTRAVENOUS

## 2015-01-28 MED ORDER — TECHNETIUM TC 99M SESTAMIBI GENERIC - CARDIOLITE
10.0000 | Freq: Once | INTRAVENOUS | Status: AC | PRN
  Administered 2015-01-28: 10 via INTRAVENOUS

## 2015-01-29 ENCOUNTER — Telehealth: Payer: Self-pay | Admitting: *Deleted

## 2015-01-29 NOTE — Telephone Encounter (Signed)
-----   Message from Herminio Commons, MD sent at 01/28/2015  4:08 PM EDT ----- Normal perfusion. Low risk study.

## 2015-01-29 NOTE — Telephone Encounter (Signed)
Patient notified

## 2015-01-29 NOTE — Telephone Encounter (Signed)
Called pt. No answer. Left message to call back.

## 2015-01-29 NOTE — Telephone Encounter (Signed)
Patient tried to return our call. Nurses were in clinic and unable to speak at that time. I informed the patient that they would call him back. He confirmed the phone number on his chart is the best contact number.

## 2015-03-04 ENCOUNTER — Ambulatory Visit (INDEPENDENT_AMBULATORY_CARE_PROVIDER_SITE_OTHER): Payer: Medicare Other | Admitting: Urology

## 2015-03-04 DIAGNOSIS — N390 Urinary tract infection, site not specified: Secondary | ICD-10-CM | POA: Diagnosis not present

## 2015-03-04 DIAGNOSIS — R972 Elevated prostate specific antigen [PSA]: Secondary | ICD-10-CM

## 2015-03-24 ENCOUNTER — Encounter: Payer: Self-pay | Admitting: Cardiovascular Disease

## 2015-03-24 ENCOUNTER — Ambulatory Visit (INDEPENDENT_AMBULATORY_CARE_PROVIDER_SITE_OTHER): Payer: Medicare Other | Admitting: Cardiovascular Disease

## 2015-03-24 VITALS — BP 140/78 | HR 88 | Ht 69.5 in | Wt 171.4 lb

## 2015-03-24 DIAGNOSIS — R070 Pain in throat: Secondary | ICD-10-CM | POA: Diagnosis not present

## 2015-03-24 DIAGNOSIS — R55 Syncope and collapse: Secondary | ICD-10-CM | POA: Diagnosis not present

## 2015-03-24 DIAGNOSIS — I471 Supraventricular tachycardia: Secondary | ICD-10-CM | POA: Insufficient documentation

## 2015-03-24 DIAGNOSIS — R072 Precordial pain: Secondary | ICD-10-CM | POA: Diagnosis not present

## 2015-03-24 MED ORDER — DILTIAZEM HCL ER COATED BEADS 120 MG PO CP24: 120.0000 mg | ORAL_CAPSULE | Freq: Every day | ORAL | Status: DC

## 2015-03-24 NOTE — Progress Notes (Signed)
Patient ID: Dustin Tucker, male   DOB: Apr 16, 1932, 81 y.o.   MRN: [id number]      SUBJECTIVE: The patient returns for follow-up after undergoing cardiovascular testing performed for the evaluation of dizziness, near syncope, and throat pain. Nuclear stress testing was normal. Event monitoring demonstrated sinus rhythm, sinus arrhythmia, and sinus bradycardia. It also demonstrated paroxysms of supraventricular tachycardia with a maximum heart rate 174 bpm. No symptoms were reported. Monitoring was done between 6/9-06/22/2016.  He has had episodic palpitations associated with throat pain. He denies further episodes of dizziness and near syncope.   Review of Systems: As per "subjective", otherwise negative.  Allergies  Allergen Reactions  . Aspirin Other (See Comments)    Gi upset, can tolerate baby aspirin    Current Outpatient Prescriptions  Medication Sig Dispense Refill  . aspirin EC 81 MG tablet Take 81 mg by mouth daily.    Marland Kitchen lisinopril (PRINIVIL,ZESTRIL) 20 MG tablet Take 20 mg by mouth 2 (two) times daily.      No current facility-administered medications for this visit.    Past Medical History  Diagnosis Date  . Hypertension   . Arthritis     hands  . 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 Carpal tunnel release Right   . Finger amputation Left     4th and 5th  . Left eye blindness Left   . Colonoscopy N/A 09/19/2013    Procedure: COLONOSCOPY;  Surgeon: Rogene Houston, MD;  Location: [facility];  Service: Endoscopy;  Laterality: N/A;  830    Social History   Social History  . Marital Status: Married    Spouse Name: N/A  . Number of Children: N/A  . Years of Education: N/A   Occupational History  . Not on file.   Social History Main Topics  . Smoking status: Former Smoker -- 0.50 packs/day    Types: Cigarettes    Start date: 08/09/1956    Quit date: 08/10/1979  . Smokeless tobacco: Not on file    . Alcohol Use: No  . Drug Use: No  . Sexual Activity: Not on file   Other Topics Concern  . Not on file   Social History Narrative     Filed Vitals:   03/24/15 0828  BP: 140/78  Pulse: 88  Height: 5' 9.5" (1.765 m)  Weight: 171 lb 6.4 oz (77.747 kg)  SpO2: 96%    PHYSICAL EXAM General: NAD HEENT: Normal. Neck: No JVD, no thyromegaly. Lungs: Clear to auscultation bilaterally with normal respiratory effort. CV: Nondisplaced PMI.  Regular rate and rhythm, normal S1/S2, no S3/S4, no murmur. No pretibial or periankle edema.  No carotid bruit.  Normal pedal pulses.  Abdomen: Soft, nontender, no hepatosplenomegaly, no distention.  Neurologic: Alert and oriented x 3.  Psych: Normal affect. Skin: Normal. Musculoskeletal: Normal range of motion, no gross deformities. Extremities: No clubbing or cyanosis.   ECG: Most recent ECG reviewed.      ASSESSMENT AND PLAN: 1. PSVT: Symptoms likely related to paroxysms of SVT. Will start long-acting diltiazem 120 mg daily.  Dispo: f/u 3 months.   Kate Sable, M.D., F.A.C.C.

## 2015-03-24 NOTE — Patient Instructions (Signed)
Your physician recommends that you schedule a follow-up appointment in: 3 MONTHS WITH DR. Bronson Ing  START DILTIAZEM 120 MG DAILY  Thanks for choosing [facility]!!!

## 2015-07-02 ENCOUNTER — Encounter: Payer: Self-pay | Admitting: Cardiovascular Disease

## 2015-07-02 ENCOUNTER — Ambulatory Visit (INDEPENDENT_AMBULATORY_CARE_PROVIDER_SITE_OTHER): Payer: Medicare Other | Admitting: Cardiovascular Disease

## 2015-07-02 VITALS — BP 164/80 | HR 87 | Ht 69.5 in | Wt 175.6 lb

## 2015-07-02 DIAGNOSIS — Z23 Encounter for immunization: Secondary | ICD-10-CM | POA: Diagnosis not present

## 2015-07-02 DIAGNOSIS — I10 Essential (primary) hypertension: Secondary | ICD-10-CM | POA: Diagnosis not present

## 2015-07-02 DIAGNOSIS — I471 Supraventricular tachycardia: Secondary | ICD-10-CM

## 2015-07-02 MED ORDER — DILTIAZEM HCL 60 MG PO TABS: 60.0000 mg | ORAL_TABLET | Freq: Two times a day (BID) | ORAL | Status: DC

## 2015-07-02 NOTE — Patient Instructions (Addendum)
Your physician wants you to follow-up in: 1 year with Dr Virgina Jock will receive a reminder letter in the mail two months in advance. If you don't receive a letter, please call our office to schedule the follow-up appointment.   Stop Cardizem CD    START Diltiazem 60 mg twice a day     If you need a refill on your cardiac medications before your next appointment, please call your pharmacy.      Thank you for choosing [facility] !

## 2015-07-02 NOTE — Progress Notes (Signed)
Patient ID: Dustin Tucker, male   DOB: [birth year], 81 y.o.   MRN: [id number]      SUBJECTIVE: The patient presents for follow-up of paroxysmal SVT. Denies palpitations. Said BP last night was 134/70. Attributes elevated reading today from just having eaten a can of tomatoes with lots of salt. Developed pruritus on left arm with long-acting diltiazem.   Review of Systems: As per "subjective", otherwise negative.  Allergies  Allergen Reactions  . Aspirin Other (See Comments)    Gi upset, can tolerate baby aspirin    Current Outpatient Prescriptions  Medication Sig Dispense Refill  . aspirin EC 81 MG tablet Take 81 mg by mouth daily.    Marland Kitchen diltiazem (CARDIZEM CD) 120 MG 24 hr capsule Take 1 capsule (120 mg total) by mouth daily. 90 capsule 3  . hydrochlorothiazide (MICROZIDE) 12.5 MG capsule     . lisinopril (PRINIVIL,ZESTRIL) 20 MG tablet Take 20 mg by mouth 2 (two) times daily.      No current facility-administered medications for this visit.    Past Medical History  Diagnosis Date  . Hypertension   . Arthritis     hands  . 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 Carpal tunnel release Right   . Finger amputation Left     4th and 5th  . Left eye blindness Left   . Colonoscopy N/A 09/19/2013    Procedure: COLONOSCOPY;  Surgeon: Rogene Houston, MD;  [facility]: [facility];  Service: Endoscopy;  Laterality: N/A;  830    Social History   Social History  . Marital Status: Married    Spouse Name: N/A  . Number of Children: N/A  . Years of Education: N/A   Occupational History  . Not on file.   Social History Main Topics  . Smoking status: Former Smoker -- 0.50 packs/day    Types: Cigarettes    Start date: 08/09/1956    Quit date: 08/10/1979  . Smokeless tobacco: Not on file  . Alcohol Use: No  . Drug Use: No  . Sexual Activity: Not on file   Other Topics Concern  . Not on file   Social History Narrative      Filed Vitals:   07/02/15 1341  BP: 164/80  Pulse: 87  Height: 5' 9.5" (1.765 m)  Weight: 175 lb 9.6 oz (79.652 kg)  SpO2: 97%    PHYSICAL EXAM General: NAD HEENT: Normal. Neck: No JVD, no thyromegaly. Lungs: Clear to auscultation bilaterally with normal respiratory effort. CV: Nondisplaced PMI.  Regular rate and rhythm, normal S1/S2, no S3/S4, no murmur. No pretibial or periankle edema.   Abdomen: Soft, nontender, no distention.  Neurologic: Alert and oriented x 3.  Psych: Normal affect. Skin: Normal. Musculoskeletal: No gross deformities. Extremities: No clubbing or cyanosis.   ECG: Most recent ECG reviewed.      ASSESSMENT AND PLAN: 1. PSVT: Symptomatically stable. Due to cost and pruritus, will switch long-acting diltiazem 120 mg daily to short-acting 60 mg bid.  2. Essential HTN: Elevated today, normal last night. Asked him to monitor this.  Dispo: f/u 1 year.   Kate Sable, M.D., F.A.C.C.

## 2016-03-09 ENCOUNTER — Ambulatory Visit (INDEPENDENT_AMBULATORY_CARE_PROVIDER_SITE_OTHER): Payer: Medicare HMO | Admitting: Urology

## 2016-03-09 DIAGNOSIS — R972 Elevated prostate specific antigen [PSA]: Secondary | ICD-10-CM

## 2016-03-09 DIAGNOSIS — N401 Enlarged prostate with lower urinary tract symptoms: Secondary | ICD-10-CM

## 2016-06-21 ENCOUNTER — Emergency Department (HOSPITAL_COMMUNITY)
Admission: EM | Admit: 2016-06-21 | Discharge: 2016-06-21 | Disposition: A | Discharge status: Home / Self Care | Payer: Medicare HMO | Attending: Emergency Medicine | Admitting: Emergency Medicine

## 2016-06-21 ENCOUNTER — Encounter (HOSPITAL_COMMUNITY): Payer: Self-pay | Admitting: Emergency Medicine

## 2016-06-21 ENCOUNTER — Emergency Department (HOSPITAL_COMMUNITY): Payer: Medicare HMO

## 2016-06-21 DIAGNOSIS — Z7982 Long term (current) use of aspirin: Secondary | ICD-10-CM | POA: Insufficient documentation

## 2016-06-21 DIAGNOSIS — Z79899 Other long term (current) drug therapy: Secondary | ICD-10-CM | POA: Insufficient documentation

## 2016-06-21 DIAGNOSIS — I10 Essential (primary) hypertension: Secondary | ICD-10-CM

## 2016-06-21 DIAGNOSIS — Z87891 Personal history of nicotine dependence: Secondary | ICD-10-CM | POA: Insufficient documentation

## 2016-06-21 DIAGNOSIS — R109 Unspecified abdominal pain: Secondary | ICD-10-CM | POA: Diagnosis not present

## 2016-06-21 DIAGNOSIS — R0789 Other chest pain: Secondary | ICD-10-CM | POA: Diagnosis present

## 2016-06-21 LAB — CBC WITH DIFFERENTIAL/PLATELET
BASOS ABS: 0 10*3/uL (ref 0.0–0.1)
Basophils Relative: 0 %
EOS PCT: 1 %
Eosinophils Absolute: 0 10*3/uL (ref 0.0–0.7)
HCT: 41.6 % (ref 39.0–52.0)
Hemoglobin: 14.3 g/dL (ref 13.0–17.0)
LYMPHS PCT: 43 %
Lymphs Abs: 2 10*3/uL (ref 0.7–4.0)
MCH: 31.1 pg (ref 26.0–34.0)
MCHC: 34.4 g/dL (ref 30.0–36.0)
MCV: 90.4 fL (ref 78.0–100.0)
Monocytes Absolute: 0.5 10*3/uL (ref 0.1–1.0)
Monocytes Relative: 11 %
Neutro Abs: 2.1 10*3/uL (ref 1.7–7.7)
Neutrophils Relative %: 45 %
PLATELETS: 124 10*3/uL — AB (ref 150–400)
RBC: 4.6 MIL/uL (ref 4.22–5.81)
RDW: 12.1 % (ref 11.5–15.5)
WBC: 4.7 10*3/uL (ref 4.0–10.5)

## 2016-06-21 LAB — COMPREHENSIVE METABOLIC PANEL
ALT: 13 U/L — AB (ref 17–63)
AST: 18 U/L (ref 15–41)
Albumin: 4 g/dL (ref 3.5–5.0)
Alkaline Phosphatase: 53 U/L (ref 38–126)
Anion gap: 7 (ref 5–15)
BUN: 17 mg/dL (ref 6–20)
CHLORIDE: 105 mmol/L (ref 101–111)
CO2: 28 mmol/L (ref 22–32)
CREATININE: 1.16 mg/dL (ref 0.61–1.24)
Calcium: 9.2 mg/dL (ref 8.9–10.3)
GFR calc Af Amer: >60 mL/min (ref >60)
GFR, EST NON AFRICAN AMERICAN: 57 mL/min — AB (ref >60)
Glucose, Bld: 110 mg/dL — ABNORMAL HIGH (ref 65–99)
POTASSIUM: 3.5 mmol/L (ref 3.5–5.1)
Sodium: 140 mmol/L (ref 135–145)
Total Bilirubin: 0.4 mg/dL (ref 0.3–1.2)
Total Protein: 6.9 g/dL (ref 6.5–8.1)

## 2016-06-21 LAB — URINALYSIS, ROUTINE W REFLEX MICROSCOPIC
Bilirubin Urine: NEGATIVE
GLUCOSE, UA: NEGATIVE mg/dL
Hgb urine dipstick: NEGATIVE
Ketones, ur: NEGATIVE mg/dL
LEUKOCYTES UA: NEGATIVE
NITRITE: NEGATIVE
PROTEIN: NEGATIVE mg/dL
Specific Gravity, Urine: <1.005 — ABNORMAL LOW (ref 1.005–1.030)
pH: 6 (ref 5.0–8.0)

## 2016-06-21 LAB — TROPONIN I

## 2016-06-21 LAB — LIPASE, BLOOD: LIPASE: 23 U/L (ref 11–51)

## 2016-06-21 MED ORDER — ONDANSETRON HCL 4 MG/2ML IJ SOLN
4.0000 mg | Freq: Once | INTRAMUSCULAR | Status: AC
  Administered 2016-06-21: 4 mg via INTRAVENOUS
  Filled 2016-06-21: qty 2

## 2016-06-21 MED ORDER — DILTIAZEM HCL 30 MG PO TABS
60.0000 mg | ORAL_TABLET | Freq: Two times a day (BID) | ORAL | Status: DC
  Administered 2016-06-21: 60 mg via ORAL
  Filled 2016-06-21: qty 2

## 2016-06-21 MED ORDER — IOPAMIDOL (ISOVUE-300) INJECTION 61%
INTRAVENOUS | Status: AC
  Filled 2016-06-21: qty 30

## 2016-06-21 MED ORDER — SODIUM CHLORIDE 0.9 % IV BOLUS (SEPSIS)
1000.0000 mL | Freq: Once | INTRAVENOUS | Status: AC
  Administered 2016-06-21: 1000 mL via INTRAVENOUS

## 2016-06-21 MED ORDER — SODIUM CHLORIDE 0.9 % IV SOLN: INTRAVENOUS | Status: DC

## 2016-06-21 MED ORDER — IOPAMIDOL (ISOVUE-300) INJECTION 61%
100.0000 mL | Freq: Once | INTRAVENOUS | Status: AC | PRN
  Administered 2016-06-21: 100 mL via INTRAVENOUS

## 2016-06-21 NOTE — ED Notes (Signed)
Pt in CT.

## 2016-06-21 NOTE — ED Provider Notes (Signed)
[facility] DEPT Provider Note   CSN: [id number] Arrival date & time: 06/21/16  1308     History   Chief Complaint Chief Complaint  Patient presents with  . Abdominal Pain    HPI Dustin Tucker is a 82 y.o. male.  HPI About two days ago he started having some pain in his stomach and some chest pressure.    The pain is abdomen was more severe and it went to his back.  The pain sometimes occurred after eating but not always.  The pain was located in the middle.  It can last a few hours at a time. No vomiting.  Some nausea.  Today he felt ok when he first get up.  He then started having discomfort in his stomach again.  He felt his heart racing and beating fast.  He did not have any chest pain or discomfort today.  No vomiting.  Some constipation.  No dysuria.  Past Medical History:  Diagnosis Date  . Arthritis    hands  . 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 Hypertension     Patient Active Problem List   Diagnosis Date Noted  . PSVT (paroxysmal supraventricular tachycardia) ([place]) 03/24/2015  . Near syncope 12/13/2014    Past Surgical History:  Procedure Laterality Date  . CARPAL TUNNEL RELEASE Right   . COLONOSCOPY N/A 09/19/2013   Procedure: COLONOSCOPY;  Surgeon: Rogene Houston, MD;  Location: [facility];  Service: Endoscopy;  Laterality: N/A;  830  . FINGER AMPUTATION Left    4th and 5th  . left eye blindness Left        Home Medications    Prior to Admission medications   Medication Sig Start Date End Date Taking? Authorizing Provider  aspirin EC 81 MG tablet Take 81 mg by mouth daily.   Yes Historical Provider, MD  diltiazem (CARDIZEM) 60 MG tablet 1 tablet 2 (two) times daily. 06/09/16  Yes Historical Provider, MD  hydrochlorothiazide (MICROZIDE) 12.5 MG capsule 25 mg daily.  06/30/15  Yes Historical Provider, MD  lisinopril (PRINIVIL,ZESTRIL) 40 MG tablet 1 tablet daily. 06/09/16  Yes Historical Provider, MD    Family History Family History    Problem Relation Age of Onset  . CAD Mother   . CAD Father     Social History Social History  Substance Use Topics  . Smoking status: Former Smoker    Packs/day: 0.50    Types: Cigarettes    Start date: 08/09/1956    Quit date: 08/10/1979  . Smokeless tobacco: Never Used  . Alcohol use No     Allergies   Aspirin   Review of Systems Review of Systems  Neurological: Positive for headaches.     Physical Exam Updated Vital Signs BP (!) 160/126   Pulse 77   Temp 97.7 F (36.5 C) (Oral)   Resp (!) 32   Ht 5\' 9"  (1.753 m)   Wt 77.1 kg   SpO2 98%   BMI 25.10 kg/m   Physical Exam  Constitutional: He appears well-developed and well-nourished. No distress.  HENT:  Head: Normocephalic and atraumatic.  Right Ear: External ear normal.  Left Ear: External ear normal.  Eyes: Conjunctivae are normal. Right eye exhibits no discharge. Left eye exhibits no discharge. No scleral icterus.  Neck: Neck supple. No tracheal deviation present.  Cardiovascular: Normal rate, regular rhythm and intact distal pulses.   Pulmonary/Chest: Effort normal and breath sounds normal. No stridor. No respiratory distress.  He has no wheezes. He has no rales.  Abdominal: Soft. Bowel sounds are normal. He exhibits no distension. There is no tenderness. There is no rebound and no guarding.  Musculoskeletal: He exhibits no edema or tenderness.  Neurological: He is alert. He has normal strength. No cranial nerve deficit (no facial droop, extraocular movements intact, no slurred speech) or sensory deficit. He exhibits normal muscle tone. He displays no seizure activity. Coordination normal.  Skin: Skin is warm and dry. No rash noted.  Psychiatric: He has a normal mood and affect.  Nursing note and vitals reviewed.    ED Treatments / Results  Labs (all labs ordered are listed, but only abnormal results are displayed) Labs Reviewed  COMPREHENSIVE METABOLIC PANEL - Abnormal; Notable for the following:        Result Value   Glucose, Bld 110 (*)    ALT 13 (*)    GFR calc non Af Amer 57 (*)    All other components within normal limits  CBC WITH DIFFERENTIAL/PLATELET - Abnormal; Notable for the following:    Platelets 124 (*)    All other components within normal limits  URINALYSIS, ROUTINE W REFLEX MICROSCOPIC (NOT AT [facility]) - Abnormal; Notable for the following:    Specific Gravity, Urine <1.005 (*)    All other components within normal limits  LIPASE, BLOOD  TROPONIN I    EKG  EKG Interpretation  Date/Time:  Monday June 21 2016 13:17:16 EST Ventricular Rate:  61 PR Interval:    QRS Duration: 97 QT Interval:  381 QTC Calculation: 384 R Axis:   60 Text Interpretation:  Sinus rhythm Prolonged PR interval No previous tracing Confirmed by Klohe Lovering  MD-J, Anayiah Howden [id number]) on 06/21/2016 1:28:14 PM       Radiology Ct Abdomen Pelvis W Contrast  Result Date: 06/21/2016 CLINICAL DATA:  Mid chest pain with nausea and abdominal pain. EXAM: CT ABDOMEN AND PELVIS WITH CONTRAST TECHNIQUE: Multidetector CT imaging of the abdomen and pelvis was performed using the standard protocol following bolus administration of intravenous contrast. CONTRAST:  130mL ISOVUE-300 IOPAMIDOL (ISOVUE-300) INJECTION 61% COMPARISON:  None. FINDINGS: Lower chest: There is minimal atelectasis and/or scarring at the left lung base with subpleural faint reticular markings in the left lower lobe and right middle lobe consistent with fibrosis. No pneumonic consolidation or effusion. Small hiatal hernia is noted. Normal visualized cardiac chamber spines. Hepatobiliary: No focal liver abnormality is seen. No gallstones, gallbladder wall thickening, or biliary dilatation. Pancreas: Unremarkable. No pancreatic ductal dilatation or surrounding inflammatory changes. Spleen: Normal in size without focal abnormality. Adrenals/Urinary Tract: Adrenal glands are unremarkable. Kidneys are normal, without renal calculi, focal lesion, or  hydronephrosis. Repeat delayed images through the kidneys demonstrate bilateral symmetric pyelograms. Bladder is unremarkable. Stomach/Bowel: The stomach is distended moderately with oral contrast. There is normal bowel rotation. No small bowel dilatation, wall nor fold thickening is seen. Mild to moderate fecal retention within large bowel. Normal-appearing appendix. Vascular/Lymphatic: There is no aortic aneurysm or dissection. Aortoiliac atherosclerosis. Patent portal and splenic veins. Reproductive: Prostate is marginally enlarged. Other: No ascites or abdominal wall hernias. Musculoskeletal: Facet degenerative hypertrophy and sclerosis from L3 through S1. 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 Soft tissue ossifications adjacent to both greater trochanters possibly representing changes secondary to calcific gluteal bursitis. IMPRESSION: No acute intra-abdominal or pelvic abnormality. 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 Electronically Signed   By: Ashley Royalty M.D.   On: 06/21/2016 18:55   Dg  Abd Acute W/chest  Result Date: 06/21/2016 CLINICAL DATA:  Tachycardia and palpitations. Abdominal pain for 3 days. EXAM: DG ABDOMEN ACUTE W/ 1V CHEST COMPARISON:  Chest radiograph on 08/12/2012 FINDINGS: Several mildly dilated small bowel loops are seen within the mid abdomen which contain air-fluid levels. A large amount of stool and bowel gas is seen throughout the colon to the level the rectum. No evidence of free intraperitoneal air. Pelvic vascular calcifications and phleboliths noted. Heart size is within normal limits. Ectasia thoracic aorta is stable. No evidence of pulmonary infiltrate, edema, or pleural effusion. Small nodular density in the right lower lung is stable since 2014, consistent with benign etiology. IMPRESSION: Nonspecific bowel gas pattern, with several mildly dilated small bowel loops and large amount of stool and gas throughout colon;  suggest clinical correlation for possible constipation. No active cardiopulmonary disease. Electronically Signed   By: Earle Gell M.D.   On: 06/21/2016 14:47    Procedures Procedures (including critical care time)  Medications Ordered in ED Medications  sodium chloride 0.9 % bolus 1,000 mL (0 mLs Intravenous Stopped 06/21/16 1647)    And  0.9 %  sodium chloride infusion (not administered)  iopamidol (ISOVUE-300) 61 % injection (not administered)  diltiazem (CARDIZEM) tablet 60 mg (not administered)  ondansetron (ZOFRAN) injection 4 mg (4 mg Intravenous Given 06/21/16 1418)  iopamidol (ISOVUE-300) 61 % injection 100 mL (100 mLs Intravenous Contrast Given 06/21/16 1827)     Initial Impression / Assessment and Plan / ED Course  I have reviewed the triage vital signs and the nursing notes.  Pertinent labs & imaging results that were available during my care of the patient were reviewed by me and considered in my medical decision making (see chart for details).  Clinical Course as of Jun 22 2011  Mon Jun 21, 2016  1648 CT scan ordered to evaluate further considering plain films findings.  [JK]    Clinical Course User Index [JK] Dorie Rank, MD    The patient's laboratory tests are reassuring.  No evidence to suggest hepatitis or pancreatitis. No evidence to suggest urinary tract infection. His white blood cell count is not elevated and he is not anemic. Because of his age and abnormal findings on the acute abdominal series I ordered a CT abdomen pelvis.  CT scan does not show any acute intra-abdominal or pelvic pathology.  Patient is feeling well without complaints. His blood pressure is elevated however he has not taken his evening dose of Cardizem. He was given an oral dose here in the emergency room.  At this time there does not appear to be any evidence of an acute emergency medical condition and the patient appears stable for discharge with appropriate outpatient follow  up.   Final Clinical Impressions(s) / ED Diagnoses   Final diagnoses:  Abdominal pain, unspecified abdominal location  Hypertension, unspecified type    New Prescriptions New Prescriptions   No medications on file     Dorie Rank, MD 06/21/16 2014

## 2016-06-21 NOTE — ED Notes (Signed)
Pt still waiting to go to CT. CT reports would be to pick up pt in approximately 15 minutes. Pt aware and verbalized understanding of delay. nad noted.

## 2016-06-21 NOTE — Discharge Instructions (Signed)
Continue your current medications, take blood pressure medications as directed, follow-up with Dr. Maudie Mercury to be rechecked later this week, return as needed for worsening symptoms

## 2016-06-21 NOTE — ED Notes (Signed)
CT aware pt finished drinking oral contrast.

## 2016-06-21 NOTE — ED Triage Notes (Signed)
Pt states a mild pain in chest, causing nausea and side pain, weakness, several times this week. Felt worse today driving.  pt states he is on heart medication to slow HR.

## 2016-06-21 NOTE — ED Notes (Signed)
EDP aware that pt's heart rate ranging from 42-60. Pt remains asymptomatic and denies chest pain,weakness,dizziness. nad noted. No new orders given.

## 2016-06-29 ENCOUNTER — Encounter: Payer: Self-pay | Admitting: Cardiovascular Disease

## 2016-06-29 ENCOUNTER — Ambulatory Visit (INDEPENDENT_AMBULATORY_CARE_PROVIDER_SITE_OTHER): Payer: Medicare HMO | Admitting: Cardiovascular Disease

## 2016-06-29 VITALS — BP 144/82 | HR 84 | Ht 69.0 in | Wt 167.0 lb

## 2016-06-29 DIAGNOSIS — I10 Essential (primary) hypertension: Secondary | ICD-10-CM

## 2016-06-29 DIAGNOSIS — I471 Supraventricular tachycardia: Secondary | ICD-10-CM | POA: Diagnosis not present

## 2016-06-29 NOTE — Patient Instructions (Signed)
Your physician wants you to follow-up in: 1 year Dr Koneswaran You will receive a reminder letter in the mail two months in advance. If you don't receive a letter, please call our office to schedule the follow-up appointment.     Your physician recommends that you continue on your current medications as directed. Please refer to the Current Medication list given to you today.    If you need a refill on your cardiac medications before your next appointment, please call your pharmacy.      Thank you for choosing [facility] !         

## 2016-06-29 NOTE — Progress Notes (Signed)
      SUBJECTIVE: The patient presents for follow-up of paroxysmal SVT. Denies palpitations.  He was evaluated in the ED recently for abdominal pain. He thinks it was due to Flomax and since he has stopped taking it he feels much better. Troponin was normal. ECG showed sinus rhythm with first-degree AV block. Chest x-ray was normal.  Review of Systems: As per "subjective", otherwise negative.  Allergies  Allergen Reactions  . Tamsulosin Hcl Nausea Only  . Aspirin Other (See Comments)    Gi upset, can tolerate baby aspirin    Current Outpatient Prescriptions  Medication Sig Dispense Refill  . aspirin EC 81 MG tablet Take 81 mg by mouth daily.    Marland Kitchen diltiazem (CARDIZEM) 60 MG tablet 1 tablet 2 (two) times daily.    . hydrochlorothiazide (HYDRODIURIL) 25 MG tablet Take 25 mg by mouth daily.     Marland Kitchen lisinopril (PRINIVIL,ZESTRIL) 40 MG tablet 1 tablet daily.     No current facility-administered medications for this visit.     Past Medical History:  Diagnosis Date  . Arthritis    hands  . 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 Hypertension     Past Surgical History:  Procedure Laterality Date  . CARPAL TUNNEL RELEASE Right   . COLONOSCOPY N/A 09/19/2013   Procedure: COLONOSCOPY;  Surgeon: Rogene Houston, MD;  Location: [facility];  Service: Endoscopy;  Laterality: N/A;  830  . FINGER AMPUTATION Left    4th and 5th  . left eye blindness Left     Social History   Social History  . Marital status: Married    Spouse name: N/A  . Number of children: N/A  . Years of education: N/A   Occupational History  . Not on file.   Social History Main Topics  . Smoking status: Former Smoker    Packs/day: 0.50    Types: Cigarettes    Start date: 08/09/1956    Quit date: 08/10/1979  . Smokeless tobacco: Never Used  . Alcohol use No  . Drug use: No  . Sexual activity: Not on file   Other Topics Concern  . Not on file   Social History Narrative  . No narrative on file      Vitals:   06/29/16 1106  BP: (!) 144/82  Pulse: 84  SpO2: 94%  Weight: 167 lb (75.8 kg)  Height: 5\' 9"  (1.753 m)    PHYSICAL EXAM General: NAD HEENT: Normal. Neck: No JVD, no thyromegaly. Lungs: Clear to auscultation bilaterally with normal respiratory effort. CV: Nondisplaced PMI.  Regular rate and rhythm, normal S1/S2, no S3/S4, no murmur. No pretibial or periankle edema.    Abdomen: Soft, nontender, no distention.  Neurologic: Alert and oriented.  Psych: Normal affect.    ECG: Most recent ECG reviewed.      ASSESSMENT AND PLAN: 1. PSVT: Symptomatically stable. Continue diltiazem.  2. Essential HTN: Reasonably controlled for age. No changes.  Dispo: f/u 1 year.   Kate Sable, M.D., F.A.C.C.

## 2016-07-10 ENCOUNTER — Other Ambulatory Visit: Payer: Self-pay | Admitting: Cardiovascular Disease

## 2017-01-24 ENCOUNTER — Telehealth: Payer: Self-pay | Admitting: Cardiovascular Disease

## 2017-01-24 NOTE — Telephone Encounter (Signed)
Platelets are mildly decreased, not low enough to be of any significant risk of bleeding and are overall stable from last check late last year. Management is per his pcp, but I don't see anything to be of great concern at this time   J Cylinda Santoli MD

## 2017-01-24 NOTE — Telephone Encounter (Signed)
Pt made aware, copy to pcp.  

## 2017-01-24 NOTE — Telephone Encounter (Signed)
Labs scanned into media. The pt is requesting that someone else look at them to let him know if anything needs to be adjusted. He is worried that his platelets are too low, Please advise. Will forward to Dr. Harl Bowie who is covering  for Dr. Bronson Ing.

## 2017-01-24 NOTE — Telephone Encounter (Signed)
Pt brought in labs from Dr. Julianne Rice office, would like to speak w/ someone concerning his Platelets count. Labs were scanned into chart

## 2017-03-15 ENCOUNTER — Ambulatory Visit (INDEPENDENT_AMBULATORY_CARE_PROVIDER_SITE_OTHER): Payer: Medicare HMO | Admitting: Urology

## 2017-03-15 DIAGNOSIS — R972 Elevated prostate specific antigen [PSA]: Secondary | ICD-10-CM

## 2017-03-15 DIAGNOSIS — N401 Enlarged prostate with lower urinary tract symptoms: Secondary | ICD-10-CM

## 2017-03-15 DIAGNOSIS — R351 Nocturia: Secondary | ICD-10-CM

## 2017-03-15 DIAGNOSIS — N5201 Erectile dysfunction due to arterial insufficiency: Secondary | ICD-10-CM | POA: Diagnosis not present

## 2017-06-28 ENCOUNTER — Encounter: Payer: Self-pay | Admitting: Cardiovascular Disease

## 2017-06-28 ENCOUNTER — Ambulatory Visit: Payer: Medicare HMO | Admitting: Cardiovascular Disease

## 2017-06-28 VITALS — BP 142/68 | HR 67 | Ht 69.0 in | Wt 165.0 lb

## 2017-06-28 DIAGNOSIS — I440 Atrioventricular block, first degree: Secondary | ICD-10-CM

## 2017-06-28 DIAGNOSIS — I471 Supraventricular tachycardia: Secondary | ICD-10-CM | POA: Diagnosis not present

## 2017-06-28 DIAGNOSIS — I10 Essential (primary) hypertension: Secondary | ICD-10-CM

## 2017-06-28 NOTE — Patient Instructions (Signed)
Your physician wants you to follow-up in: 6 months with Dr.Koneswaran You will receive a reminder letter in the mail two months in advance. If you don't receive a letter, please call our office to schedule the follow-up appointment.   Your physician recommends that you continue on your current medications as directed. Please refer to the Current Medication list given to you today.    If you need a refill on your cardiac medications before your next appointment, please call your pharmacy.     No lab work or tests ordered today.     Thank you for choosing [facility] !         

## 2017-06-28 NOTE — Progress Notes (Signed)
SUBJECTIVE: The patient presents for follow-up of paroxysmal supraventricular tachycardia.  He said he felt a little sick this past Sunday morning.  He had fluctuating blood pressures and felt like his heart was sporadically racing.  He had some dizziness.  He felt better towards the latter half of the day and has felt well since then.  He only has some mild chest tightness when he has rapid palpitations.  He has no significant exertional dyspnea.  ECG performed in the office today which I ordered and personally interpreted demonstrated sinus bradycardia with sinus arrhythmia and first-degree AV block, PR interval 272 ms.  Heart rate was 46 bpm.   Review of Systems: As per "subjective", otherwise negative.  Allergies  Allergen Reactions  . Tamsulosin Hcl Nausea Only  . Aspirin Other (See Comments)    Gi upset, can tolerate baby aspirin    Current Outpatient Medications  Medication Sig Dispense Refill  . aspirin EC 81 MG tablet Take 81 mg by mouth daily.    Marland Kitchen diltiazem (CARDIZEM) 60 MG tablet TAKE (1) TABLET BY MOUTH TWICE DAILY. 60 tablet 6  . hydrochlorothiazide (HYDRODIURIL) 25 MG tablet Take 25 mg by mouth daily.     Marland Kitchen lisinopril (PRINIVIL,ZESTRIL) 40 MG tablet 1 tablet daily.     No current facility-administered medications for this visit.     Past Medical History:  Diagnosis Date  . Arthritis    hands  . 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 Hypertension     Past Surgical History:  Procedure Laterality Date  . CARPAL TUNNEL RELEASE Right   . COLONOSCOPY N/A 09/19/2013   Procedure: COLONOSCOPY;  Surgeon: Rogene Houston, MD;  Location: [facility];  Service: Endoscopy;  Laterality: N/A;  830  . FINGER AMPUTATION Left    4th and 5th  . left eye blindness Left     Social History   Socioeconomic History  . Marital status: Married    Spouse name: Not on file  . Number of children: Not on file  . Years of education: Not on file  . Highest education  level: Not on file  Social Needs  . Financial resource strain: Not on file  . Food insecurity - worry: Not on file  . Food insecurity - inability: Not on file  . Transportation needs - medical: Not on file  . Transportation needs - non-medical: Not on file  Occupational History  . Not on file  Tobacco Use  . Smoking status: Former Smoker    Packs/day: 0.50    Types: Cigarettes    Start date: 08/09/1956    Last attempt to quit: 08/10/1979    Years since quitting: 37.9  . Smokeless tobacco: Never Used  Substance and Sexual Activity  . Alcohol use: No    Alcohol/week: 0.0 oz  . Drug use: No  . Sexual activity: Not on file  Other Topics Concern  . Not on file  Social History Narrative  . Not on file     Vitals:   06/28/17 1334  BP: (!) 142/68  Pulse: 67  SpO2: 97%  Weight: 165 lb (74.8 kg)  Height: 5\' 9"  (1.753 m)    Wt Readings from Last 3 Encounters:  06/28/17 165 lb (74.8 kg)  06/29/16 167 lb (75.8 kg)  06/21/16 170 lb (77.1 kg)     PHYSICAL EXAM General: NAD HEENT: Normal. Neck: No JVD, no thyromegaly. Lungs: Clear to auscultation bilaterally with normal  respiratory effort. CV: Regular rate and rhythm, normal S1/S2, no S3/S4, no murmur. No pretibial or periankle edema.  No carotid bruit.   Abdomen: Soft, nontender, no distention.  Neurologic: Alert and oriented.  Psych: Normal affect. Skin: Normal. Musculoskeletal: No gross deformities.    ECG: Most recent ECG reviewed.   Labs: Lab Results  Component Value Date/Time   K 3.5 06/21/2016 01:41 PM   BUN 17 06/21/2016 01:41 PM   CREATININE 1.16 06/21/2016 01:41 PM   ALT 13 (L) 06/21/2016 01:41 PM   HGB 14.3 06/21/2016 01:41 PM     Lipids: No results found for: LDLCALC, LDLDIRECT, CHOL, TRIG, HDL     ASSESSMENT AND PLAN:  1.  Paroxysmal supraventricular tachycardia: Overall he is symptomatically stable.  While he is bradycardic, he denies dizziness and lightheadedness.  Palpitations appear to be  overall well controlled on current dose of diltiazem, 60 mg twice daily.  No changes to therapy.  2. Essential HTN:  Mildly elevated.  No changes to therapy today.  3.  First-degree AV block: PR interval is 272 ms.  I will continue to monitor this.   Disposition: Follow up 6 months.   Kate Sable, M.D., F.A.C.C.

## 2017-06-29 ENCOUNTER — Ambulatory Visit: Payer: Medicare HMO | Admitting: Cardiovascular Disease

## 2017-12-13 ENCOUNTER — Ambulatory Visit (HOSPITAL_COMMUNITY)
Admission: RE | Admit: 2017-12-13 | Discharge: 2017-12-13 | Disposition: A | Discharge status: Home / Self Care | Payer: Medicare HMO | Source: Ambulatory Visit | Attending: Family Medicine | Admitting: Family Medicine

## 2017-12-13 ENCOUNTER — Other Ambulatory Visit (HOSPITAL_COMMUNITY): Payer: Self-pay | Admitting: Family Medicine

## 2017-12-13 DIAGNOSIS — M25511 Pain in right shoulder: Secondary | ICD-10-CM | POA: Insufficient documentation

## 2017-12-13 DIAGNOSIS — M24811 Other specific joint derangements of right shoulder, not elsewhere classified: Secondary | ICD-10-CM | POA: Insufficient documentation

## 2018-01-03 ENCOUNTER — Ambulatory Visit: Payer: Medicare HMO | Admitting: Cardiovascular Disease

## 2018-01-05 ENCOUNTER — Encounter: Payer: Self-pay | Admitting: Orthopaedic Surgery

## 2018-01-05 ENCOUNTER — Ambulatory Visit: Payer: Medicare HMO | Admitting: Orthopaedic Surgery

## 2018-01-05 VITALS — BP 163/78 | HR 82 | Temp 97.0°F | Ht 69.0 in | Wt 162.0 lb

## 2018-01-05 DIAGNOSIS — M7530 Calcific tendinitis of unspecified shoulder: Secondary | ICD-10-CM | POA: Diagnosis not present

## 2018-01-05 NOTE — Progress Notes (Signed)
Subjective:    Patient ID: Dustin Tucker, male    DOB: [birth year], 84 y.o.   MRN: [id number]  HPI He fell and hurt his right shoulder in March of this year.  It bothered him for a few days and got better.  Then around the first of May he started having more pain in the right shoulder.  He saw Dr. Maudie Mercury.  X-rays were done and they showed: IMPRESSION: Findings likely reflect calcific tendinosis of the distal aspect of the rotator cuff but a small avulsion fracture from the greater tuberosity is not excluded. The glenohumeral joint is grossly normal. There are mild degenerative changes of the [facility] joint.  He has pain more at night. He has no swelling, no numbness and has full motion.  He has taken Advil and used ice which both helped some.   Review of Systems  Respiratory: Negative for cough and shortness of breath.   Cardiovascular: Negative for chest pain and leg swelling.  Endocrine: Positive for cold intolerance.  Musculoskeletal: Positive for arthralgias.  Allergic/Immunologic: Positive for environmental allergies.   Past Medical History:  Diagnosis Date  . Arthritis    hands  . 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 Hypertension     Past Surgical History:  Procedure Laterality Date  . CARPAL TUNNEL RELEASE Right   . COLONOSCOPY N/A 09/19/2013   Procedure: COLONOSCOPY;  Surgeon: Rogene Houston, MD;  Location[facility];  Service: Endoscopy;  Laterality: N/A;  830  . FINGER AMPUTATION Left    4th and 5th  . left eye blindness Left     Current Outpatient Medications on File Prior to Visit  Medication Sig Dispense Refill  . aspirin EC 81 MG tablet Take 81 mg by mouth daily.    . cholecalciferol (VITAMIN D) 1000 units tablet Take 1,000 Units by mouth daily.    Marland Kitchen diltiazem (CARDIZEM) 60 MG tablet TAKE (1) TABLET BY MOUTH TWICE DAILY. 60 tablet 6  . hydrochlorothiazide (HYDRODIURIL) 25 MG tablet Take 25 mg by mouth daily.     Marland Kitchen lisinopril (PRINIVIL,ZESTRIL) 40 MG  tablet 1 tablet daily.     No current facility-administered medications on file prior to visit.     Social History   Socioeconomic History  . Marital status: Married    Spouse name: Not on file  . Number of children: Not on file  . Years of education: Not on file  . Highest education level: Not on file  Occupational History  . Not on file  Social Needs  . Financial resource strain: Not on file  . Food insecurity:    Worry: Not on file    Inability: Not on file  . Transportation needs:    Medical: Not on file    Non-medical: Not on file  Tobacco Use  . Smoking status: Former Smoker    Packs/day: 0.50    Types: Cigarettes    Start date: 08/09/1956    Last attempt to quit: 08/10/1979    Years since quitting: 38.4  . Smokeless tobacco: Never Used  Substance and Sexual Activity  . Alcohol use: No    Alcohol/week: 0.0 oz  . Drug use: No  . Sexual activity: Not on file  Lifestyle  . Physical activity:    Days per week: Not on file    Minutes per session: Not on file  . Stress: Not on file  Relationships  . Social connections:    Talks on  phone: Not on file    Gets together: Not on file    Attends religious service: Not on file    Active member of club or organization: Not on file    Attends meetings of clubs or organizations: Not on file    Relationship status: Not on file  . Intimate partner violence:    Fear of current or ex partner: Not on file    Emotionally abused: Not on file    Physically abused: Not on file    Forced sexual activity: Not on file  Other Topics Concern  . Not on file  Social History Narrative  . Not on file    Family History  Problem Relation Age of Onset  . CAD Mother   . CAD Father   . Cancer Brother     BP (!) 163/78   Pulse 82   Temp (!) 97 F (36.1 C)   Ht 5\' 9"  (1.753 m)   Wt 162 lb (73.5 kg)   BMI 23.92 kg/m      Objective:   Physical Exam  Constitutional: He is oriented to person, place, and time. He appears  well-developed and well-nourished.  HENT:  Head: Normocephalic and atraumatic.  Eyes: Pupils are equal, round, and reactive to light. Conjunctivae and EOM are normal.  Neck: Normal range of motion. Neck supple.  Cardiovascular: Normal rate, regular rhythm and intact distal pulses.  Pulmonary/Chest: Effort normal.  Abdominal: Soft.  Musculoskeletal:       Right shoulder: He exhibits tenderness.       Arms: Neurological: He is alert and oriented to person, place, and time. He has normal reflexes. He displays normal reflexes. No cranial nerve deficit. He exhibits normal muscle tone. Coordination normal.  Skin: Skin is warm and dry.  Psychiatric: He has a normal mood and affect. His behavior is normal. Judgment and thought content normal.     I have reviewed the prior X-rays and notes from Dr. Maudie Mercury.     Assessment & Plan:   Encounter Diagnosis  Name Primary?  . Calcific bursitis of shoulder Yes   PROCEDURE NOTE:  The patient request injection, verbal consent was obtained.  The right shoulder was prepped appropriately after time out was performed.   Sterile technique was observed and injection of 1 cc of Depo-Medrol 40 mg with several cc's of plain xylocaine. Anesthesia was provided by ethyl chloride and a 20-gauge needle was used to inject the shoulder area. A posterior approach was used.  The injection was tolerated well.  A band aid dressing was applied.  The patient was advised to apply ice later today and tomorrow to the injection sight as needed.  Return in two weeks.  Call if any problem.  Precautions discussed.   Electronically Signed Sanjuana Kava, MD 5/30/20199:09 AM

## 2018-01-11 DIAGNOSIS — N400 Enlarged prostate without lower urinary tract symptoms: Secondary | ICD-10-CM | POA: Diagnosis not present

## 2018-01-11 DIAGNOSIS — Z6822 Body mass index (BMI) 22.0-22.9, adult: Secondary | ICD-10-CM | POA: Diagnosis not present

## 2018-01-11 DIAGNOSIS — R000 Tachycardia, unspecified: Secondary | ICD-10-CM | POA: Diagnosis not present

## 2018-01-11 DIAGNOSIS — H547 Unspecified visual loss: Secondary | ICD-10-CM | POA: Diagnosis not present

## 2018-01-11 DIAGNOSIS — Z7982 Long term (current) use of aspirin: Secondary | ICD-10-CM | POA: Diagnosis not present

## 2018-01-11 DIAGNOSIS — I10 Essential (primary) hypertension: Secondary | ICD-10-CM | POA: Diagnosis not present

## 2018-01-11 DIAGNOSIS — Z89022 Acquired absence of left finger(s): Secondary | ICD-10-CM | POA: Diagnosis not present

## 2018-01-11 DIAGNOSIS — Z961 Presence of intraocular lens: Secondary | ICD-10-CM | POA: Diagnosis not present

## 2018-01-19 ENCOUNTER — Encounter: Payer: Self-pay | Admitting: Orthopaedic Surgery

## 2018-01-19 ENCOUNTER — Ambulatory Visit: Payer: Medicare HMO | Admitting: Orthopaedic Surgery

## 2018-01-19 VITALS — BP 171/85 | HR 53 | Ht 69.0 in | Wt 159.0 lb

## 2018-01-19 DIAGNOSIS — E559 Vitamin D deficiency, unspecified: Secondary | ICD-10-CM | POA: Diagnosis not present

## 2018-01-19 DIAGNOSIS — Z008 Encounter for other general examination: Secondary | ICD-10-CM | POA: Diagnosis not present

## 2018-01-19 DIAGNOSIS — I10 Essential (primary) hypertension: Secondary | ICD-10-CM | POA: Diagnosis not present

## 2018-01-19 DIAGNOSIS — R739 Hyperglycemia, unspecified: Secondary | ICD-10-CM | POA: Diagnosis not present

## 2018-01-19 DIAGNOSIS — Z79899 Other long term (current) drug therapy: Secondary | ICD-10-CM | POA: Diagnosis not present

## 2018-01-19 DIAGNOSIS — M7530 Calcific tendinitis of unspecified shoulder: Secondary | ICD-10-CM

## 2018-01-19 DIAGNOSIS — R972 Elevated prostate specific antigen [PSA]: Secondary | ICD-10-CM | POA: Diagnosis not present

## 2018-01-19 NOTE — Progress Notes (Signed)
CC:  My shoulder is much better  He has full motion of the right shoulder and no pain.  He is doing well since the injection last time.  NV intact.  Encounter Diagnosis  Name Primary?  . Calcific bursitis of shoulder Yes   I will see as needed.  Call if any problem.  Precautions discussed.   Electronically Signed Sanjuana Kava, MD 6/13/20199:17 AM

## 2018-01-25 DIAGNOSIS — Z0000 Encounter for general adult medical examination without abnormal findings: Secondary | ICD-10-CM | POA: Diagnosis not present

## 2018-01-25 DIAGNOSIS — E559 Vitamin D deficiency, unspecified: Secondary | ICD-10-CM | POA: Diagnosis not present

## 2018-01-25 DIAGNOSIS — R972 Elevated prostate specific antigen [PSA]: Secondary | ICD-10-CM | POA: Diagnosis not present

## 2018-01-25 DIAGNOSIS — I10 Essential (primary) hypertension: Secondary | ICD-10-CM | POA: Diagnosis not present

## 2018-01-25 DIAGNOSIS — Z23 Encounter for immunization: Secondary | ICD-10-CM | POA: Diagnosis not present

## 2018-01-25 DIAGNOSIS — E7439 Other disorders of intestinal carbohydrate absorption: Secondary | ICD-10-CM | POA: Diagnosis not present

## 2018-01-25 DIAGNOSIS — Z13820 Encounter for screening for osteoporosis: Secondary | ICD-10-CM | POA: Diagnosis not present

## 2018-01-25 DIAGNOSIS — Z6824 Body mass index (BMI) 24.0-24.9, adult: Secondary | ICD-10-CM | POA: Diagnosis not present

## 2018-01-25 DIAGNOSIS — Z79899 Other long term (current) drug therapy: Secondary | ICD-10-CM | POA: Diagnosis not present

## 2018-02-06 ENCOUNTER — Other Ambulatory Visit (HOSPITAL_COMMUNITY): Payer: Self-pay | Admitting: Family Medicine

## 2018-02-06 DIAGNOSIS — Z79899 Other long term (current) drug therapy: Secondary | ICD-10-CM

## 2018-02-16 ENCOUNTER — Ambulatory Visit: Payer: Medicare HMO | Admitting: Cardiovascular Disease

## 2018-02-16 ENCOUNTER — Encounter: Payer: Self-pay | Admitting: Cardiovascular Disease

## 2018-02-16 VITALS — BP 124/64 | HR 58 | Ht 69.0 in | Wt 163.0 lb

## 2018-02-16 DIAGNOSIS — I471 Supraventricular tachycardia: Secondary | ICD-10-CM | POA: Diagnosis not present

## 2018-02-16 DIAGNOSIS — I10 Essential (primary) hypertension: Secondary | ICD-10-CM | POA: Diagnosis not present

## 2018-02-16 DIAGNOSIS — I440 Atrioventricular block, first degree: Secondary | ICD-10-CM

## 2018-02-16 MED ORDER — DILTIAZEM HCL 30 MG PO TABS: 30.0000 mg | ORAL_TABLET | Freq: Every day | ORAL | 3 refills | Status: DC

## 2018-02-16 NOTE — Patient Instructions (Signed)
Medication Instructions:  DECREASE DILTIAZEM TO 30 MG DAILY   Labwork: NONE  Testing/Procedures: NONE  Follow-Up: Your physician wants you to follow-up in: 6 MONTHS,.  You will receive a reminder letter in the mail two months in advance. If you don't receive a letter, please call our office to schedule the follow-up appointment.   Any Other Special Instructions Will Be Listed Below (If Applicable).     If you need a refill on your cardiac medications before your next appointment, please call your pharmacy.

## 2018-02-16 NOTE — Progress Notes (Signed)
SUBJECTIVE: The patient presents for follow-up of paroxysmal supraventricular tachycardia.  He has first-degree AV block.  PR interval was 272 ms on 06/28/2017.  He seldom has palpitations.  He seldom has shortness of breath and chest tightness and associates this with faster heart rates.  Symptoms resolved once heart rate regulates.  He can experience the symptoms at the most every other day.  Last Sunday he had some right leg heaviness but denies leg swelling.  He has had no balance issues and no falls.  He takes diltiazem 60 mg once a day because he felt like he had increased joint pains taking it twice a day.    Review of Systems: As per "subjective", otherwise negative.  Allergies  Allergen Reactions  . Tamsulosin Hcl Nausea Only  . Aspirin Other (See Comments)    Gi upset, can tolerate baby aspirin    Current Outpatient Medications  Medication Sig Dispense Refill  . aspirin EC 81 MG tablet Take 81 mg by mouth daily.    . cholecalciferol (VITAMIN D) 1000 units tablet Take 1,000 Units by mouth daily.    Marland Kitchen diltiazem (CARDIZEM) 60 MG tablet TAKE (1) TABLET BY MOUTH TWICE DAILY. 60 tablet 6  . hydrochlorothiazide (HYDRODIURIL) 25 MG tablet Take 25 mg by mouth daily.     Marland Kitchen lisinopril (PRINIVIL,ZESTRIL) 40 MG tablet 1 tablet daily.     No current facility-administered medications for this visit.     Past Medical History:  Diagnosis Date  . Arthritis    hands  . 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 Hypertension     Past Surgical History:  Procedure Laterality Date  . CARPAL TUNNEL RELEASE Right   . COLONOSCOPY N/A 09/19/2013   Procedure: COLONOSCOPY;  Surgeon: Rogene Houston, MD;  [facility];  Service: Endoscopy;  Laterality: N/A;  830  . FINGER AMPUTATION Left    4th and 5th  . left eye blindness Left     Social History   Socioeconomic History  . Marital status: Married    Spouse name: Not on file  . Number of children: Not on file  . Years  of education: Not on file  . Highest education level: Not on file  Occupational History  . Not on file  Social Needs  . Financial resource strain: Not on file  . Food insecurity:    Worry: Not on file    Inability: Not on file  . Transportation needs:    Medical: Not on file    Non-medical: Not on file  Tobacco Use  . Smoking status: Former Smoker    Packs/day: 0.50    Types: Cigarettes    Start date: 08/09/1956    Last attempt to quit: 08/10/1979    Years since quitting: 38.5  . Smokeless tobacco: Never Used  Substance and Sexual Activity  . Alcohol use: No    Alcohol/week: 0.0 oz  . Drug use: No  . Sexual activity: Not on file  Lifestyle  . Physical activity:    Days per week: Not on file    Minutes per session: Not on file  . Stress: Not on file  Relationships  . Social connections:    Talks on phone: Not on file    Gets together: Not on file    Attends religious service: Not on file    Active member of club or organization: Not on file    Attends meetings of clubs  or organizations: Not on file    Relationship status: Not on file  . Intimate partner violence:    Fear of current or ex partner: Not on file    Emotionally abused: Not on file    Physically abused: Not on file    Forced sexual activity: Not on file  Other Topics Concern  . Not on file  Social History Narrative  . Not on file     Vitals:   02/16/18 0950  BP: 124/64  Pulse: (!) 58  SpO2: 97%  Weight: 163 lb (73.9 kg)  Height: 5\' 9"  (1.753 m)    Wt Readings from Last 3 Encounters:  02/16/18 163 lb (73.9 kg)  01/19/18 159 lb (72.1 kg)  01/05/18 162 lb (73.5 kg)     PHYSICAL EXAM General: NAD HEENT: Normal. Neck: No JVD, no thyromegaly. Lungs: Clear to auscultation bilaterally with normal respiratory effort. CV: Rate a cardiac, regular rhythm, normal S1/S2, no S3/S4, no murmur. No pretibial or periankle edema.  No carotid bruit.   Abdomen: Soft, nontender, no distention.  Neurologic:  Alert and oriented.  Psych: Normal affect. Skin: Normal. Musculoskeletal: No gross deformities.    ECG: Reviewed above under Subjective   Labs: Lab Results  Component Value Date/Time   K 3.5 06/21/2016 01:41 PM   BUN 17 06/21/2016 01:41 PM   CREATININE 1.16 06/21/2016 01:41 PM   ALT 13 (L) 06/21/2016 01:41 PM   HGB 14.3 06/21/2016 01:41 PM     Lipids: No results found for: LDLCALC, LDLDIRECT, CHOL, TRIG, HDL     ASSESSMENT AND PLAN: 1.  Paroxysmal supraventricular tachycardia: Symptomatically stable.  He remains bradycardic.  He only takes diltiazem 60 mg once daily because he felt like he had increased joint pains taking it twice daily.  I will reduce the dose to 30 mg.  He denies lightheadedness, dizziness, and falls.  He also denies syncope.  2. Essential HTN:  Trolled on present therapy.  He is on lisinopril and hydrochlorothiazide.  3.  First-degree AV block: PR interval is 272 ms.  I will continue to monitor this.  He appears to be asymptomatic.      Disposition: Follow up 6 months   Kate Sable, M.D., F.A.C.C.

## 2018-02-24 ENCOUNTER — Ambulatory Visit (HOSPITAL_COMMUNITY)
Admission: RE | Admit: 2018-02-24 | Discharge: 2018-02-24 | Disposition: A | Discharge status: Home / Self Care | Payer: Medicare HMO | Source: Ambulatory Visit | Attending: Family Medicine | Admitting: Family Medicine

## 2018-02-24 DIAGNOSIS — E559 Vitamin D deficiency, unspecified: Secondary | ICD-10-CM | POA: Insufficient documentation

## 2018-02-24 DIAGNOSIS — Z13820 Encounter for screening for osteoporosis: Secondary | ICD-10-CM | POA: Insufficient documentation

## 2018-02-24 DIAGNOSIS — Z79899 Other long term (current) drug therapy: Secondary | ICD-10-CM | POA: Diagnosis not present

## 2018-03-27 DIAGNOSIS — H40011 Open angle with borderline findings, low risk, right eye: Secondary | ICD-10-CM | POA: Diagnosis not present

## 2018-03-27 DIAGNOSIS — H35361 Drusen (degenerative) of macula, right eye: Secondary | ICD-10-CM | POA: Diagnosis not present

## 2018-03-27 DIAGNOSIS — H25011 Cortical age-related cataract, right eye: Secondary | ICD-10-CM | POA: Diagnosis not present

## 2018-03-27 DIAGNOSIS — H2511 Age-related nuclear cataract, right eye: Secondary | ICD-10-CM | POA: Diagnosis not present

## 2018-03-28 ENCOUNTER — Ambulatory Visit: Payer: Medicare HMO | Admitting: Urology

## 2018-07-26 DIAGNOSIS — J309 Allergic rhinitis, unspecified: Secondary | ICD-10-CM | POA: Diagnosis not present

## 2018-07-26 DIAGNOSIS — I10 Essential (primary) hypertension: Secondary | ICD-10-CM | POA: Diagnosis not present

## 2018-07-26 DIAGNOSIS — I471 Supraventricular tachycardia: Secondary | ICD-10-CM | POA: Diagnosis not present

## 2018-07-26 DIAGNOSIS — Z79899 Other long term (current) drug therapy: Secondary | ICD-10-CM | POA: Diagnosis not present

## 2018-08-17 DIAGNOSIS — H524 Presbyopia: Secondary | ICD-10-CM | POA: Diagnosis not present

## 2018-08-23 ENCOUNTER — Ambulatory Visit: Payer: Medicare HMO | Admitting: Cardiovascular Disease

## 2018-08-24 ENCOUNTER — Encounter: Payer: Self-pay | Admitting: Cardiovascular Disease

## 2018-09-07 ENCOUNTER — Encounter: Payer: Self-pay | Admitting: Cardiovascular Disease

## 2018-09-07 ENCOUNTER — Ambulatory Visit (INDEPENDENT_AMBULATORY_CARE_PROVIDER_SITE_OTHER): Payer: Medicare HMO

## 2018-09-07 ENCOUNTER — Ambulatory Visit: Payer: Medicare HMO | Admitting: Cardiovascular Disease

## 2018-09-07 VITALS — BP 127/88 | HR 100 | Ht 69.0 in | Wt 164.0 lb

## 2018-09-07 DIAGNOSIS — I10 Essential (primary) hypertension: Secondary | ICD-10-CM

## 2018-09-07 DIAGNOSIS — I440 Atrioventricular block, first degree: Secondary | ICD-10-CM | POA: Diagnosis not present

## 2018-09-07 DIAGNOSIS — R001 Bradycardia, unspecified: Secondary | ICD-10-CM | POA: Diagnosis not present

## 2018-09-07 DIAGNOSIS — R000 Tachycardia, unspecified: Secondary | ICD-10-CM | POA: Diagnosis not present

## 2018-09-07 DIAGNOSIS — I495 Sick sinus syndrome: Secondary | ICD-10-CM | POA: Diagnosis not present

## 2018-09-07 NOTE — Patient Instructions (Signed)
Medication Instructions:  Your physician recommends that you continue on your current medications as directed. Please refer to the Current Medication list given to you today.  If you need a refill on your cardiac medications before your next appointment, please call your pharmacy.   Lab work: None today If you have labs (blood work) drawn today and your tests are completely normal, you will receive your results only by: Marland Kitchen MyChart Message (if you have MyChart) OR . A paper copy in the mail If you have any lab test that is abnormal or we need to change your treatment, we will call you to review the results.  Testing/Procedures: Your physician has recommended that you wear an event monitor for 14 days. Event monitors are medical devices that record the heart's electrical activity. Doctors most often [place] these monitors to diagnose arrhythmias. Arrhythmias are problems with the speed or rhythm of the heartbeat. The monitor is a small, portable device. You can wear one while you do your normal daily activities. This is usually used to diagnose what is causing palpitations/syncope (passing out).    Follow-Up: At [facility], you and your health needs are our priority.  As part of our continuing mission to provide you with exceptional heart care, we have created designated Provider Care Teams.  These Care Teams include your primary Cardiologist (physician) and Advanced Practice Providers (APPs -  Physician Assistants and Nurse Practitioners) who all work together to provide you with the care you need, when you need it. You will need a follow up appointment in 3 months.  Please call our office 2 months in advance to schedule this appointment.  You may see Kate Sable, MD or one of the following Advanced Practice Providers on your designated Care Team:   Bernerd Pho, PA-C [facility]) . Ermalinda Barrios, PA-C ([place])  Any Other Special Instructions Will Be Listed Below (If  Applicable). None

## 2018-09-07 NOTE — Progress Notes (Signed)
SUBJECTIVE: The patient presents for follow-up of paroxysmal supraventricular tachycardia.  He has first-degree AV block.   He denies chest pain and palpitations.  He said his blood pressure sometimes gets up to 160/100 and he feels dizzy when this occurs but says this is seldom in occurrence.  He denies syncope, falls, and exertional fatigue.  Initial vital signs recorded by the nurse demonstrated a heart rate of 100 bpm.  When I auscultated him he was bradycardic and then quickly became tachycardic. He can feel when this occurs.  I then ordered an ECG.  ECG performed in the office today which I ordered and personally interpreted demonstrates sinus bradycardia with marked first-degree AV block, 49 bpm, possible old anteroseptal infarct, and nonspecific T wave abnormalities.    Review of Systems: As per "subjective", otherwise negative.  Allergies  Allergen Reactions  . Tamsulosin Hcl Nausea Only  . Aspirin Other (See Comments)    Gi upset, can tolerate baby aspirin    Current Outpatient Medications  Medication Sig Dispense Refill  . aspirin EC 81 MG tablet Take 81 mg by mouth daily.    . cholecalciferol (VITAMIN D) 1000 units tablet Take 1,000 Units by mouth daily.    Marland Kitchen diltiazem (CARDIZEM) 30 MG tablet Take 1 tablet (30 mg total) by mouth daily. 90 tablet 3  . hydrochlorothiazide (HYDRODIURIL) 25 MG tablet Take 25 mg by mouth daily.     Marland Kitchen lisinopril (PRINIVIL,ZESTRIL) 40 MG tablet Take 40 mg by mouth daily.      No current facility-administered medications for this visit.     Past Medical History:  Diagnosis Date  . Arthritis    hands  . 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 Hypertension     Past Surgical History:  Procedure Laterality Date  . CARPAL TUNNEL RELEASE Right   . COLONOSCOPY N/A 09/19/2013   Procedure: COLONOSCOPY;  Surgeon: Rogene Houston, MD;  [facility];  Service: Endoscopy;  Laterality: N/A;  830  . FINGER AMPUTATION Left    4th and 5th  . left eye blindness Left     Social History   Socioeconomic History  . Marital status: Married    Spouse name: Not on file  . Number of children: Not on file  . Years of education: Not on file  . Highest education level: Not on file  Occupational History  . Not on file  Social Needs  . Financial resource strain: Not on file  . Food insecurity:    Worry: Not on file    Inability: Not on file  . Transportation needs:    Medical: Not on file    Non-medical: Not on file  Tobacco Use  . Smoking status: Former Smoker    Packs/day: 0.50    Types: Cigarettes    Start date: 08/09/1956    Last attempt to quit: 08/10/1979    Years since quitting: 39.1  . Smokeless tobacco: Never Used  Substance and Sexual Activity  . Alcohol use: No    Alcohol/week: 0.0 standard drinks  . Drug use: No  . Sexual activity: Not on file  Lifestyle  . Physical activity:    Days per week: Not on file    Minutes per session: Not on file  . Stress: Not on file  Relationships  . Social connections:    Talks on phone: Not on file    Gets together: Not on file    Attends  religious service: Not on file    Active member of club or organization: Not on file    Attends meetings of clubs or organizations: Not on file    Relationship status: Not on file  . Intimate partner violence:    Fear of current or ex partner: Not on file    Emotionally abused: Not on file    Physically abused: Not on file    Forced sexual activity: Not on file  Other Topics Concern  . Not on file  Social History Narrative  . Not on file     Vitals:   09/07/18 1334  BP: 127/88  Pulse: 100  SpO2: 98%  Weight: 164 lb (74.4 kg)  Height: 5\' 9"  (1.753 m)    Wt Readings from Last 3 Encounters:  09/07/18 164 lb (74.4 kg)  02/16/18 163 lb (73.9 kg)  01/19/18 159 lb (72.1 kg)     PHYSICAL EXAM General: NAD HEENT: Normal. Neck: No JVD, no thyromegaly. Lungs: Clear to auscultation bilaterally with normal  respiratory effort. CV: Heart rate at upper normal limits, then bradycardic, and then HR in 90 bpm range, regular rhythm, normal S1/S2, no S3/S4, no murmur. No pretibial or periankle edema.   Abdomen: Soft, nontender, no distention.  Neurologic: Alert and oriented.  Psych: Normal affect. Skin: Normal. Musculoskeletal: No gross deformities.    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    He takes diltiazem 30 mg daily.    He was initially bradycardic but then became tachycardic.  I will order a 2-week event monitor.  2. Essential EYC:XKGYJ pressure is normal in our office but occasionally elevated at home.  No changes to therapy for now.  3. First-degree AV block: ECG reviewed above.  I will order a 2-week event monitor.   Disposition: Follow up 3 months   Kate Sable, M.D., F.A.C.C.

## 2018-09-16 DIAGNOSIS — R000 Tachycardia, unspecified: Secondary | ICD-10-CM

## 2018-09-20 ENCOUNTER — Other Ambulatory Visit: Payer: Self-pay

## 2018-09-20 ENCOUNTER — Emergency Department (HOSPITAL_COMMUNITY): Payer: Medicare HMO

## 2018-09-20 ENCOUNTER — Encounter (HOSPITAL_COMMUNITY): Payer: Self-pay | Admitting: *Deleted

## 2018-09-20 ENCOUNTER — Inpatient Hospital Stay (HOSPITAL_COMMUNITY)
Admission: EM | Admit: 2018-09-20 | Discharge: 2018-09-22 | DRG: 274 | Disposition: A | Discharge status: Home / Self Care | Payer: Medicare HMO | Attending: Internal Medicine | Admitting: Internal Medicine

## 2018-09-20 DIAGNOSIS — Z7982 Long term (current) use of aspirin: Secondary | ICD-10-CM | POA: Diagnosis not present

## 2018-09-20 DIAGNOSIS — I4891 Unspecified atrial fibrillation: Secondary | ICD-10-CM | POA: Diagnosis present

## 2018-09-20 DIAGNOSIS — R05 Cough: Secondary | ICD-10-CM | POA: Diagnosis not present

## 2018-09-20 DIAGNOSIS — I447 Left bundle-branch block, unspecified: Secondary | ICD-10-CM | POA: Diagnosis not present

## 2018-09-20 DIAGNOSIS — Z888 Allergy status to other drugs, medicaments and biological substances status: Secondary | ICD-10-CM

## 2018-09-20 DIAGNOSIS — Z87891 Personal history of nicotine dependence: Secondary | ICD-10-CM | POA: Diagnosis not present

## 2018-09-20 DIAGNOSIS — I471 Supraventricular tachycardia: Principal | ICD-10-CM | POA: Diagnosis present

## 2018-09-20 DIAGNOSIS — M19041 Primary osteoarthritis, right hand: Secondary | ICD-10-CM | POA: Diagnosis not present

## 2018-09-20 DIAGNOSIS — Z809 Family history of malignant neoplasm, unspecified: Secondary | ICD-10-CM

## 2018-09-20 DIAGNOSIS — Z8249 Family history of ischemic heart disease and other diseases of the circulatory system: Secondary | ICD-10-CM | POA: Diagnosis not present

## 2018-09-20 DIAGNOSIS — H5462 Unqualified visual loss, left eye, normal vision right eye: Secondary | ICD-10-CM | POA: Diagnosis present

## 2018-09-20 DIAGNOSIS — I495 Sick sinus syndrome: Secondary | ICD-10-CM | POA: Diagnosis present

## 2018-09-20 DIAGNOSIS — R42 Dizziness and giddiness: Secondary | ICD-10-CM

## 2018-09-20 DIAGNOSIS — R112 Nausea with vomiting, unspecified: Secondary | ICD-10-CM | POA: Diagnosis not present

## 2018-09-20 DIAGNOSIS — R1033 Periumbilical pain: Secondary | ICD-10-CM | POA: Diagnosis not present

## 2018-09-20 DIAGNOSIS — I440 Atrioventricular block, first degree: Secondary | ICD-10-CM | POA: Diagnosis present

## 2018-09-20 DIAGNOSIS — I10 Essential (primary) hypertension: Secondary | ICD-10-CM | POA: Diagnosis present

## 2018-09-20 DIAGNOSIS — Z886 Allergy status to analgesic agent status: Secondary | ICD-10-CM

## 2018-09-20 DIAGNOSIS — M19042 Primary osteoarthritis, left hand: Secondary | ICD-10-CM | POA: Diagnosis not present

## 2018-09-20 DIAGNOSIS — R109 Unspecified abdominal pain: Secondary | ICD-10-CM | POA: Diagnosis not present

## 2018-09-20 DIAGNOSIS — R51 Headache: Secondary | ICD-10-CM

## 2018-09-20 DIAGNOSIS — R519 Headache, unspecified: Secondary | ICD-10-CM

## 2018-09-20 DIAGNOSIS — R079 Chest pain, unspecified: Secondary | ICD-10-CM | POA: Diagnosis not present

## 2018-09-20 LAB — COMPREHENSIVE METABOLIC PANEL
ALT: 20 U/L (ref 0–44)
ANION GAP: 11 (ref 5–15)
AST: 25 U/L (ref 15–41)
Albumin: 4 g/dL (ref 3.5–5.0)
Alkaline Phosphatase: 62 U/L (ref 38–126)
BUN: 20 mg/dL (ref 8–23)
CHLORIDE: 105 mmol/L (ref 98–111)
CO2: 23 mmol/L (ref 22–32)
Calcium: 9.1 mg/dL (ref 8.9–10.3)
Creatinine, Ser: 1.09 mg/dL (ref 0.61–1.24)
GFR calc non Af Amer: >60 mL/min (ref >60)
Glucose, Bld: 129 mg/dL — ABNORMAL HIGH (ref 70–99)
Potassium: 3.5 mmol/L (ref 3.5–5.1)
SODIUM: 139 mmol/L (ref 135–145)
Total Bilirubin: 0.6 mg/dL (ref 0.3–1.2)
Total Protein: 7.1 g/dL (ref 6.5–8.1)

## 2018-09-20 LAB — TSH: TSH: 1.176 u[IU]/mL (ref 0.350–4.500)

## 2018-09-20 LAB — CBC WITH DIFFERENTIAL/PLATELET
ABS IMMATURE GRANULOCYTES: 0.02 10*3/uL (ref 0.00–0.07)
BASOS PCT: 1 %
Basophils Absolute: 0 10*3/uL (ref 0.0–0.1)
Eosinophils Absolute: 0.1 10*3/uL (ref 0.0–0.5)
Eosinophils Relative: 2 %
HCT: 47.6 % (ref 39.0–52.0)
Hemoglobin: 15.5 g/dL (ref 13.0–17.0)
IMMATURE GRANULOCYTES: 0 %
Lymphocytes Relative: 60 %
Lymphs Abs: 3.7 10*3/uL (ref 0.7–4.0)
MCH: 30 pg (ref 26.0–34.0)
MCHC: 32.6 g/dL (ref 30.0–36.0)
MCV: 92.2 fL (ref 80.0–100.0)
Monocytes Absolute: 0.4 10*3/uL (ref 0.1–1.0)
Monocytes Relative: 7 %
NEUTROS ABS: 1.9 10*3/uL (ref 1.7–7.7)
NEUTROS PCT: 30 %
PLATELETS: 155 10*3/uL (ref 150–400)
RBC: 5.16 MIL/uL (ref 4.22–5.81)
RDW: 12.6 % (ref 11.5–15.5)
WBC: 6.2 10*3/uL (ref 4.0–10.5)
nRBC: 0 % (ref 0.0–0.2)

## 2018-09-20 LAB — BRAIN NATRIURETIC PEPTIDE: B Natriuretic Peptide: 390 pg/mL — ABNORMAL HIGH (ref 0.0–100.0)

## 2018-09-20 LAB — LIPASE, BLOOD: Lipase: 27 U/L (ref 11–51)

## 2018-09-20 LAB — INFLUENZA PANEL BY PCR (TYPE A & B)
Influenza A By PCR: NEGATIVE
Influenza B By PCR: NEGATIVE

## 2018-09-20 LAB — TROPONIN I: Troponin I: <0.03 ng/mL (ref <0.03)

## 2018-09-20 MED ORDER — FENTANYL CITRATE (PF) 100 MCG/2ML IJ SOLN
50.0000 ug | Freq: Once | INTRAMUSCULAR | Status: AC
  Administered 2018-09-20: 50 ug via INTRAVENOUS
  Filled 2018-09-20: qty 2

## 2018-09-20 MED ORDER — ACETAMINOPHEN 650 MG RE SUPP: 650.0000 mg | Freq: Four times a day (QID) | RECTAL | Status: DC | PRN

## 2018-09-20 MED ORDER — POTASSIUM CHLORIDE IN NACL 20-0.9 MEQ/L-% IV SOLN
INTRAVENOUS | Status: DC
  Administered 2018-09-20 – 2018-09-21 (×2): via INTRAVENOUS
  Filled 2018-09-20: qty 1000

## 2018-09-20 MED ORDER — MECLIZINE HCL 12.5 MG PO TABS
25.0000 mg | ORAL_TABLET | Freq: Once | ORAL | Status: AC
  Administered 2018-09-20: 25 mg via ORAL
  Filled 2018-09-20: qty 2

## 2018-09-20 MED ORDER — VITAMIN D 25 MCG (1000 UNIT) PO TABS
1000.0000 [IU] | ORAL_TABLET | Freq: Every day | ORAL | Status: DC
  Administered 2018-09-21 – 2018-09-22 (×2): 1000 [IU] via ORAL
  Filled 2018-09-20 (×5): qty 1

## 2018-09-20 MED ORDER — HYDRALAZINE HCL 20 MG/ML IJ SOLN
10.0000 mg | Freq: Once | INTRAMUSCULAR | Status: AC
  Administered 2018-09-20: 10 mg via INTRAVENOUS
  Filled 2018-09-20: qty 1

## 2018-09-20 MED ORDER — ASPIRIN EC 81 MG PO TBEC
81.0000 mg | DELAYED_RELEASE_TABLET | Freq: Every day | ORAL | Status: DC
  Administered 2018-09-21 – 2018-09-22 (×2): 81 mg via ORAL
  Filled 2018-09-20 (×2): qty 1

## 2018-09-20 MED ORDER — ONDANSETRON HCL 4 MG/2ML IJ SOLN: 4.0000 mg | Freq: Four times a day (QID) | INTRAMUSCULAR | Status: DC | PRN

## 2018-09-20 MED ORDER — ENOXAPARIN SODIUM 40 MG/0.4ML ~~LOC~~ SOLN
40.0000 mg | SUBCUTANEOUS | Status: DC
  Administered 2018-09-20: 40 mg via SUBCUTANEOUS
  Filled 2018-09-20: qty 0.4

## 2018-09-20 MED ORDER — LISINOPRIL 40 MG PO TABS
40.0000 mg | ORAL_TABLET | Freq: Every day | ORAL | Status: DC
  Administered 2018-09-21 – 2018-09-22 (×3): 40 mg via ORAL
  Filled 2018-09-20 (×3): qty 1

## 2018-09-20 MED ORDER — GADOBUTROL 1 MMOL/ML IV SOLN
8.0000 mL | Freq: Once | INTRAVENOUS | Status: AC | PRN
  Administered 2018-09-20: 8 mL via INTRAVENOUS

## 2018-09-20 MED ORDER — POTASSIUM CHLORIDE IN NACL 20-0.9 MEQ/L-% IV SOLN
INTRAVENOUS | Status: DC
  Administered 2018-09-20: 13:00:00 via INTRAVENOUS
  Filled 2018-09-20: qty 1000

## 2018-09-20 MED ORDER — ONDANSETRON HCL 4 MG/2ML IJ SOLN
4.0000 mg | Freq: Once | INTRAMUSCULAR | Status: AC
  Administered 2018-09-20: 4 mg via INTRAVENOUS
  Filled 2018-09-20: qty 2

## 2018-09-20 MED ORDER — METOPROLOL TARTRATE 5 MG/5ML IV SOLN
5.0000 mg | Freq: Once | INTRAVENOUS | Status: AC
  Administered 2018-09-20: 5 mg via INTRAVENOUS
  Filled 2018-09-20: qty 5

## 2018-09-20 MED ORDER — ONDANSETRON HCL 4 MG PO TABS: 4.0000 mg | ORAL_TABLET | Freq: Four times a day (QID) | ORAL | Status: DC | PRN

## 2018-09-20 MED ORDER — ACETAMINOPHEN 325 MG PO TABS: 650.0000 mg | ORAL_TABLET | Freq: Four times a day (QID) | ORAL | Status: DC | PRN

## 2018-09-20 MED ORDER — ENSURE ENLIVE PO LIQD: 237.0000 mL | Freq: Two times a day (BID) | ORAL | Status: DC

## 2018-09-20 NOTE — ED Triage Notes (Signed)
Pt c/o headache when he woke up at 0430 today and states he laid back down but the headache did not go away; pt states he broke out into a "cold sweat"; pt is on a heart monitor for palpitations; pt denies any chest pain but states he does have some sob; pt c/o dizziness

## 2018-09-20 NOTE — ED Provider Notes (Signed)
[facility] EMERGENCY DEPARTMENT Provider Note   CSN: [id number] Arrival date & time: 09/20/18  8101  Time seen 05:25 AM   History   Chief Complaint Chief Complaint  Patient presents with  . Headache    HPI Dustin Tucker is a 85 y.o. male.  HPI patient states he got up to use the bathroom around 4:30 AM this morning and when he laid down afterwards he had acute onset of breaking out in a cold sweat, feeling dizzy, and lightheaded and feeling like his heart is racing.  He had some shortness of breath but denies chest pain.  He denies having palpitations now but states he had them all the way to the ED.  Patient drove himself to the ED without difficulty.  He also states shortly after the symptoms happen he had a frontal headache that got gradually worse.  He denies any visual changes, any new numbness or tingling of his extremities, or any weakness of his extremities.  He states he got dizzy and had the blurred vision when he sat up after the symptoms started when he laid down.  He also describes a spinning sensation.  He denies nausea or vomiting.  He states he had something similar about 4 years ago when they told him his heart was racing.  Patient was seen by his cardiologist on February 6 and had a cardiac monitor placed, he is supposed to have it taken off on the 14th.  The monitor is making a high-pitched alarm sound and having a red flashing light, patient cannot hear the alarm.  Patient denies being on any blood thinners other than a baby aspirin once a day.  PCP Jani Gravel, MD Cardiologist Dr Jacinta Shoe  Past Medical History:  Diagnosis Date  . Arthritis    hands  . 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 Hypertension     Patient Active Problem List   Diagnosis Date Noted  . PSVT (paroxysmal supraventricular tachycardia) ([place]) 03/24/2015  . Near syncope 12/13/2014    Past Surgical History:  Procedure Laterality Date  . CARPAL TUNNEL RELEASE Right   . COLONOSCOPY  N/A 09/19/2013   Procedure: COLONOSCOPY;  Surgeon: Rogene Houston, MD;  Location: [facility];  Service: Endoscopy;  Laterality: N/A;  830  . FINGER AMPUTATION Left    4th and 5th  . left eye blindness Left         Home Medications    Prior to Admission medications   Medication Sig Start Date End Date Taking? Authorizing Provider  aspirin EC 81 MG tablet Take 81 mg by mouth daily.    [provider]  cholecalciferol (VITAMIN D) 1000 units tablet Take 1,000 Units by mouth daily.    [provider]  diltiazem (CARDIZEM) 30 MG tablet Take 1 tablet (30 mg total) by mouth daily. 02/16/18   Herminio Commons, MD  hydrochlorothiazide (HYDRODIURIL) 25 MG tablet Take 25 mg by mouth daily.  06/25/16   [provider]  lisinopril (PRINIVIL,ZESTRIL) 40 MG tablet Take 40 mg by mouth daily.  06/09/16   [provider]    Family History Family History  Problem Relation Age of Onset  . CAD Mother   . CAD Father   . Cancer Brother     Social History Social History   Tobacco Use  . Smoking status: Former Smoker    Packs/day: 0.50    Types: Cigarettes    Start date: 08/09/1956  Last attempt to quit: 08/10/1979    Years since quitting: 39.1  . Smokeless tobacco: Never Used  Substance Use Topics  . Alcohol use: No    Alcohol/week: 0.0 standard drinks  . Drug use: No  lives alone   Allergies   Tamsulosin hcl and Aspirin   Review of Systems Review of Systems  All other systems reviewed and are negative.    Physical Exam ED Triage Vitals  Enc Vitals Group     BP 09/20/18 0531 (!) 163/109     Pulse Rate 09/20/18 0531 (!) 128     Resp 09/20/18 0531 (!) 21     Temp 09/20/18 0531 98.2 F (36.8 C)     Temp Source 09/20/18 0531 Oral     SpO2 09/20/18 0531 100 %     Weight 09/20/18 0521 163 lb 12.8 oz (74.3 kg)     Height 09/20/18 0521 5\' 9"  (1.753 m)     Head Circumference --      Peak Flow --      Pain Score 09/20/18 0521 3     Pain Loc  --      Pain Edu? --      Excl. in [place]? --     Vital signs normal except for hypertension, his blood pressure is 163/109, and tachycardia  Physical Exam Vitals signs and nursing note reviewed.  Constitutional:      Appearance: Normal appearance. He is not ill-appearing or toxic-appearing.     Comments: Thin elderly male  HENT:     Head: Normocephalic and atraumatic.     Right Ear: External ear normal.     Left Ear: External ear normal.     Nose: Nose normal. No mucosal edema or rhinorrhea.     Mouth/Throat:     Mouth: Mucous membranes are dry.     Dentition: No dental abscesses.     Pharynx: No uvula swelling.  Eyes:     Extraocular Movements: Extraocular movements intact.     Conjunctiva/sclera: Conjunctivae normal.     Pupils: Pupils are equal, round, and reactive to light.  Neck:     Musculoskeletal: Full passive range of motion without pain, normal range of motion and neck supple.  Cardiovascular:     Rate and Rhythm: Tachycardia present. Rhythm irregular.     Heart sounds: Normal heart sounds. No murmur. No friction rub. No gallop.   Pulmonary:     Effort: Pulmonary effort is normal. No respiratory distress.     Breath sounds: Normal breath sounds. No wheezing, rhonchi or rales.  Chest:     Chest wall: No tenderness or crepitus.  Abdominal:     General: Bowel sounds are normal. There is no distension.     Palpations: Abdomen is soft.     Tenderness: There is no abdominal tenderness. There is no guarding or rebound.  Musculoskeletal: Normal range of motion.        General: No tenderness.     Comments: Moves all extremities well.   Skin:    General: Skin is warm and dry.     Coloration: Skin is not pale.     Findings: No erythema or rash.  Neurological:     General: No focal deficit present.     Mental Status: He is alert and oriented to person, place, and time.     Cranial Nerves: No cranial nerve deficit.  Psychiatric:        Mood and Affect: Mood normal. Mood  is  not anxious.        Speech: Speech normal.        Behavior: Behavior normal.        Thought Content: Thought content normal.      ED Treatments / Results  Labs (all labs ordered are listed, but only abnormal results are displayed) Results for orders placed or performed during the hospital encounter of 09/20/18  Comprehensive metabolic panel  Result Value Ref Range   Sodium 139 135 - 145 mmol/L   Potassium 3.5 3.5 - 5.1 mmol/L   Chloride 105 98 - 111 mmol/L   CO2 23 22 - 32 mmol/L   Glucose, Bld 129 (H) 70 - 99 mg/dL   BUN 20 8 - 23 mg/dL   Creatinine, Ser 1.09 0.61 - 1.24 mg/dL   Calcium 9.1 8.9 - 10.3 mg/dL   Total Protein 7.1 6.5 - 8.1 g/dL   Albumin 4.0 3.5 - 5.0 g/dL   AST 25 15 - 41 U/L   ALT 20 0 - 44 U/L   Alkaline Phosphatase 62 38 - 126 U/L   Total Bilirubin 0.6 0.3 - 1.2 mg/dL   GFR calc non Af Amer >60 >60 mL/min   GFR calc Af Amer >60 >60 mL/min   Anion gap 11 5 - 15  Brain natriuretic peptide  Result Value Ref Range   B Natriuretic Peptide 390.0 (H) 0.0 - 100.0 pg/mL  Troponin I - Once  Result Value Ref Range   Troponin I <0.03 <0.03 ng/mL  CBC with Differential  Result Value Ref Range   WBC 6.2 4.0 - 10.5 K/uL   RBC 5.16 4.22 - 5.81 MIL/uL   Hemoglobin 15.5 13.0 - 17.0 g/dL   HCT 47.6 39.0 - 52.0 %   MCV 92.2 80.0 - 100.0 fL   MCH 30.0 26.0 - 34.0 pg   MCHC 32.6 30.0 - 36.0 g/dL   RDW 12.6 11.5 - 15.5 %   Platelets 155 150 - 400 K/uL   nRBC 0.0 0.0 - 0.2 %   Neutrophils Relative % 30 %   Neutro Abs 1.9 1.7 - 7.7 K/uL   Lymphocytes Relative 60 %   Lymphs Abs 3.7 0.7 - 4.0 K/uL   Monocytes Relative 7 %   Monocytes Absolute 0.4 0.1 - 1.0 K/uL   Eosinophils Relative 2 %   Eosinophils Absolute 0.1 0.0 - 0.5 K/uL   Basophils Relative 1 %   Basophils Absolute 0.0 0.0 - 0.1 K/uL   Immature Granulocytes 0 %   Abs Immature Granulocytes 0.02 0.00 - 0.07 K/uL   Laboratory interpretation all normal except mild elevation of BNP    EKG EKG  Interpretation  Date/Time:  Wednesday September 20 2018 05:24:52 EST Ventricular Rate:  124 PR Interval:    QRS Duration: 118 QT Interval:  308 QTC Calculation: 443 R Axis:   35 Text Interpretation:  Atrial fibrillation Ventricular premature complex Incomplete left bundle branch block LVH with secondary repolarization abnormality Anterior Q waves, possibly due to LVH Since last tracing rate faster 21 Jun 2016 Atrial fibrillation has replaced Normal sinus rhythm Confirmed by Rolland Porter [phone]) on 09/20/2018 5:39:09 AM   Radiology Ct Head Wo Contrast  Result Date: 09/20/2018 CLINICAL DATA:  Headache this morning EXAM: CT HEAD WITHOUT CONTRAST TECHNIQUE: Contiguous axial images were obtained from the base of the skull through the vertex without intravenous contrast. COMPARISON:  None available FINDINGS: Brain: No evidence of acute infarction, hemorrhage, hydrocephalus, extra-axial collection or mass lesion/mass effect. Age  congruent cerebral volume loss. Vascular: No hyperdense vessel or unexpected calcification. Skull: Normal. Negative for fracture or focal lesion. Sinuses/Orbits: Left cataract resection. Minor mucosal thickening in the left maxillary sinus. No maxillary fluid levels. IMPRESSION: No acute finding or explanation for headache. Electronically Signed   By: Monte Fantasia M.D.   On: 09/20/2018 06:10   Mr Virgel Paling JH Contrast  Result Date: 09/20/2018 CLINICAL DATA:  85 year old male with headache weakness and dizziness for 3 days. EXAM: MRA HEAD WITHOUT CONTRAST TECHNIQUE: Angiographic images of the Circle of Willis were obtained using MRA technique without intravenous contrast. COMPARISON:  Brain MRI today reported separately. FINDINGS: Antegrade flow in the posterior circulation. Mildly dominant distal left vertebral artery. No distal vertebral stenosis. Patent right PICA and dominant appearing left AICA origins. Patent vertebrobasilar junction and basilar artery. Normal SCA and PCA  origins. Posterior communicating arteries are diminutive or absent. Bilateral PCA branches are within normal limits. Antegrade flow in both ICA siphons. Tortuous bilateral ICAs in the head and upper neck. No siphon stenosis. Patent carotid termini. Normal MCA and ACA origins. Tortuous proximal ACAs. Anterior communicating artery and visible ACA branches are within normal limits. Mildly tortuous MCA M1 segments. Left MCA M1, bifurcation, and visible branches are within normal limits. Right MCA M1, bifurcation, and visible right MCA branches are within normal limits. IMPRESSION: Negative intracranial MRA aside from arterial tortuosity. Electronically Signed   By: Genevie Ann M.D.   On: 09/20/2018 08:31   Mr Brain Wo Contrast  Result Date: 09/20/2018 CLINICAL DATA:  85 year old male with headache weakness and dizziness for 3 days. EXAM: MRI HEAD WITHOUT CONTRAST TECHNIQUE: Multiplanar, multiecho pulse sequences of the brain and surrounding structures were obtained without intravenous contrast. COMPARISON:  Head CT without contrast earlier today. FINDINGS: Brain: No restricted diffusion to suggest acute infarction. No midline shift, mass effect, evidence of mass lesion, ventriculomegaly, extra-axial collection or acute intracranial hemorrhage. Cervicomedullary junction and pituitary are within normal limits. Pearline Cables and white matter signal is within normal limits for age throughout the brain. No encephalomalacia or chronic cerebral blood products identified. Vascular: Major intracranial vascular flow voids are preserved. Skull and upper cervical spine: Degenerative ligamentous hypertrophy about the odontoid. No destructive bone lesion is evident. Sinuses/Orbits: Postoperative changes to the left globe. Negative right orbit. Mild to moderate ethmoid and left maxillary sinus mucosal thickening. Other: Mastoids are clear. Visible internal auditory structures appear normal. Scalp and face soft tissues appear negative.  IMPRESSION: 1. No acute intracranial abnormality. Normal for age noncontrast MRI appearance of the brain. 2. Mild paranasal sinus disease. Electronically Signed   By: Genevie Ann M.D.   On: 09/20/2018 08:27    Procedures .Critical Care Performed by: Rolland Porter, MD Authorized by: Rolland Porter, MD   Critical care provider statement:    Critical care time (minutes):  36   Critical care was necessary to treat or prevent imminent or life-threatening deterioration of the following conditions:  Cardiac failure   Critical care was time spent personally by me on the following activities:  Discussions with consultants, examination of patient, obtaining history from patient or surrogate, ordering and review of laboratory studies, ordering and review of radiographic studies, pulse oximetry, re-evaluation of patient's condition and review of old charts   (including critical care time)  Medications Ordered in ED Medications  fentaNYL (SUBLIMAZE) injection 50 mcg (has no administration in time range)  metoprolol tartrate (LOPRESSOR) injection 5 mg (5 mg Intravenous Given 09/20/18 0557)  meclizine (ANTIVERT) tablet 25  mg (25 mg Oral Given 09/20/18 0556)  gadobutrol (GADAVIST) 1 MMOL/ML injection 8 mL (8 mLs Intravenous Contrast Given 09/20/18 0727)     Initial Impression / Assessment and Plan / ED Course  I have reviewed the triage vital signs and the nursing notes.  Pertinent labs & imaging results that were available during my care of the patient were reviewed by me and considered in my medical decision making (see chart for details).     Patient was hypertensive and tachycardic.  He appears he now is in atrial fibrillation.  He was given Lopressor IV.  He was given Antivert for his complaints of dizziness that he describes as a spinning sensation.  Head CT was done to look for acute stroke, however he may need MRI because a stroke would be most consistent with a cerebellar stroke which is not always easily  visualized on CT scan.  Patient is in atrial fibrillation and has not been anticoagulated so he is at risk for having an embolic stroke  5:88 AM after patient received Lopressor nurses report his heart rate did get down into the 40s and then improved.  When I walked in the room at 6:20 AM his heart rate was 105.  He now complains of worsening headache and states he now is having periumbilical abdominal pain.  He states if he lays flat his headache and abdominal pain feel worse, he denies having nausea or vomiting to me.  When he states he feels sick he means his abdomen is hurting.  At least that was what he told me.  MRI of the brain and MRA of the abdomen was done.  Again with due to atrial fibrillation with fast ventricular response without being on anticoagulation concern is for embolic events.  CT scan is not available now after 6 AM.  I reviewed Dr. Court Joy note from January 30.  Patient has a diagnosis of PSVT/bradycardia syndrome.  He placed the event monitor on him and he is supposed to wear it for 2 weeks.  Patient is on diltiazem 30 mg a day.  6:20 AM I spoke to Dr. Harl Bowie, cardiology, they will come see patient.  6:25 AM patient has returned from MRI.  He states his headache is better however he still has abdominal pain.  He was placed back on the monitor and his heart rate is 107 and appears to be sinus tachycardia.  We are waiting for his MRI results.  08:38 AM Dr Rogene Houston made aware of patient and cardiology consult. Will check results of Abd MR/MRA.   Final Clinical Impressions(s) / ED Diagnoses   Final diagnoses:  Atrial fibrillation with rapid ventricular response (HCC)  Dizziness  Acute nonintractable headache, unspecified headache type  Periumbilical abdominal pain  Tachy-brady syndrome ([place])    Disposition pending  Rolland Porter, MD, Barbette Or, MD 09/20/18 [phone]

## 2018-09-20 NOTE — ED Notes (Signed)
Pt transported to MRI 

## 2018-09-20 NOTE — ED Notes (Signed)
Pt having pauses in EKG. MD notified

## 2018-09-20 NOTE — ED Notes (Signed)
Pt HR dropping from 105 to 48 bpm. MD notified

## 2018-09-20 NOTE — H&P (Addendum)
History and Physical  Dustin Tucker BUL:[id number] DOB: 1931/08/28 DOA: 09/20/2018   PCP: Dustin Gravel, MD   Patient coming from: Home  Chief Complaint: palpitations and dizziness  HPI:  Dustin Tucker is a 85 y.o. male with medical history of paroxysmal SVT, tachybradycardia syndrome, hypertension presenting with acute onset of palpitations, dizziness, and diaphoresis.  The patient has had a known history of tachybradycardia syndrome.  He was evaluated most recently by cardiology in the office on 09/07/2018.  A 2-week event monitor was placed at that time.  He woke up in the early a.m. to 1220 around 4:30 AM.  He subsequently went back to lay down and developed dizziness, diaphoresis, and palpitations.  However, the patient states that he has been feeling on and off palpitations for several months at this point.  He also has had some associate shortness of breath but denied any chest pain.  The patient also endorsed a frontal headache but denied any visual disturbance or focal extremity weakness.  The patient actually drove himself to the hospital. The patient also endorsed some intermittent nausea for the past 2 days.  He had an episode of dry heaving in the emergency department with abdominal pain.  He stated that that started this morning.  He had denied any fevers, chills, vomiting, diarrhea prior to this hospitalization.  There is no dysuria or hematuria or hematochezia or melena.  However, the patient has been complaining of URI type symptoms with rhinorrhea, coughing, chest congestion since 09/14/2018.  His last bowel movement was on 09/19/2018.   In the emergency department, the patient was initially tachycardic with SVT.  He was given Lopressor IV resulting and bradycardia into the 40s.  Cardiology was consulted and saw the patient.  They recommended transfer to Dustin Tucker for electrophysiology to see the patient.  Assessment/Plan: Tachybradycardia syndrome -His event monitor shows  intermittent episodes of narrow complex and regular tachycardia consistent with a junctional tachycardia -Patient was noted to have pauses in the ED with the longest being 2.11 seconds with bradycardia in the 40s-50s. -Appreciate cardiology consult -Transfer to Dustin Tucker for electrophysiology to see  Nausea and vomiting -Suspect viral gastritis associated with URI type symptoms -Influenza PCR -IV fluids -As needed antiemetics -09/20/2018 MRI abdomen and pelvis--negative for acute findings; patent mesenteric arteries -LFTs and lipase normal -CXR  Essential hypertension -Continue lisinopril -Holding HCTZ in the setting of dry heaves       Past Medical History:  Diagnosis Date  . Arthritis    hands  . 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 Hypertension    Past Surgical History:  Procedure Laterality Date  . CARPAL TUNNEL RELEASE Right   . COLONOSCOPY N/A 09/19/2013   Procedure: COLONOSCOPY;  Surgeon: Rogene Houston, MD;  Location: [facility];  Service: Endoscopy;  Laterality: N/A;  830  . FINGER AMPUTATION Left    4th and 5th  . left eye blindness Left    Social History:  reports that he quit smoking about 39 years ago. His smoking use included cigarettes. He started smoking about 62 years ago. He smoked 0.50 packs per day. He has never used smokeless tobacco. He reports that he does not drink alcohol or use drugs.   Family History  Problem Relation Age of Onset  . CAD Mother   . CAD Father   . Cancer Brother      Allergies  Allergen Reactions  . Tamsulosin Hcl Nausea  Only  . Aspirin Other (See Comments)    Gi upset, can tolerate baby aspirin     Prior to Admission medications   Medication Sig Start Date End Date Taking? Authorizing Provider  aspirin EC 81 MG tablet Take 81 mg by mouth daily.    [provider]  cholecalciferol (VITAMIN D) 1000 units tablet Take 1,000 Units by mouth daily.    [provider]  diltiazem (CARDIZEM) 30 MG  tablet Take 1 tablet (30 mg total) by mouth daily. 02/16/18   Herminio Commons, MD  hydrochlorothiazide (HYDRODIURIL) 25 MG tablet Take 25 mg by mouth daily.  06/25/16   [provider]  lisinopril (PRINIVIL,ZESTRIL) 40 MG tablet Take 40 mg by mouth daily.  06/09/16   [provider]    Review of Systems:  Constitutional:  No weight loss, night sweats, Fevers, chills, fatigue.  Head&Eyes: No headache.  No vision loss.  No eye pain or scotoma ENT:  No Difficulty swallowing,Tooth/dental problems,Sore throat,  No ear ache, post nasal drip,  Cardio-vascular:  No chest pain, Orthopnea, PND, swelling in lower extremities,  GI:  No   diarrhea, loss of appetite, hematochezia, melena, heartburn, indigestion, Resp:  No coughing up of blood .No wheezing.No chest wall deformity  Skin:  no rash or lesions.  GU:  no dysuria, change in color of urine, no urgency or frequency. No flank pain.  Musculoskeletal:  No joint pain or swelling. No decreased range of motion. No back pain.  Psych:  No change in mood or affect. No depression or anxiety. Neurologic: No headache, no dysesthesia, no focal weakness, no vision loss. No syncope  Physical Exam: Vitals:   09/20/18 0542 09/20/18 0700 09/20/18 0830 09/20/18 0900  BP: (!) 131/92 (!) 144/112 (!) 145/109 (!) 135/124  Pulse: (!) 109 (!) 104 (!) 107 (!) 106  Resp: (!) 24 (!) 21 16 15   Temp:      TempSrc:      SpO2: 99% 98% 99% 99%  Weight:      Height:       General:  A&O x 3, NAD, nontoxic, pleasant/cooperative Head/Eye: No conjunctival hemorrhage, no icterus, [place]/AT, No nystagmus ENT:  No icterus,  No thrush, good dentition, no pharyngeal exudate Neck:  No masses, no lymphadenpathy, no bruits CV:  irregular, no rub, no gallop, no S3 Lung:  Left basilar crackles. No wheeze Abdomen: soft/NT, +BS, nondistended, no peritoneal signs Ext: No cyanosis, No rashes, No petechiae, No lymphangitis, No edema Neuro: CNII-XII intact,  strength 4/5 in bilateral upper and lower extremities, no dysmetria  Labs on Admission:  Basic Metabolic Panel: Recent Labs  Lab 09/20/18 0541  NA 139  K 3.5  CL 105  CO2 23  GLUCOSE 129*  BUN 20  CREATININE 1.09  CALCIUM 9.1   Liver Function Tests: Recent Labs  Lab 09/20/18 0541  AST 25  ALT 20  ALKPHOS 62  BILITOT 0.6  PROT 7.1  ALBUMIN 4.0   Recent Labs  Lab 09/20/18 0541  LIPASE 27   No results for input(s): AMMONIA in the last 168 hours. CBC: Recent Labs  Lab 09/20/18 0541  WBC 6.2  NEUTROABS 1.9  HGB 15.5  HCT 47.6  MCV 92.2  PLT 155   Coagulation Profile: No results for input(s): INR, PROTIME in the last 168 hours. Cardiac Enzymes: Recent Labs  Lab 09/20/18 0541 09/20/18 0928  TROPONINI <0.03 <0.03   BNP: Invalid input(s): POCBNP CBG: No results for input(s): GLUCAP in the last  168 hours. Urine analysis:    Component Value Date/Time   COLORURINE YELLOW 06/21/2016 1502   APPEARANCEUR CLEAR 06/21/2016 1502   LABSPEC <1.005 (L) 06/21/2016 1502   PHURINE 6.0 06/21/2016 1502   GLUCOSEU NEGATIVE 06/21/2016 1502   HGBUR NEGATIVE 06/21/2016 1502   BILIRUBINUR NEGATIVE 06/21/2016 1502   KETONESUR NEGATIVE 06/21/2016 1502   PROTEINUR NEGATIVE 06/21/2016 1502   UROBILINOGEN 0.2 08/24/2014 1720   NITRITE NEGATIVE 06/21/2016 1502   LEUKOCYTESUR NEGATIVE 06/21/2016 1502   Sepsis Labs: @LABRCNTIP (procalcitonin:4,lacticidven:4) )No results found for this or any previous visit (from the past 240 hour(s)).   Radiological Exams on Admission: Ct Head Wo Contrast  Result Date: 09/20/2018 CLINICAL DATA:  Headache this morning EXAM: CT HEAD WITHOUT CONTRAST TECHNIQUE: Contiguous axial images were obtained from the base of the skull through the vertex without intravenous contrast. COMPARISON:  None available FINDINGS: Brain: No evidence of acute infarction, hemorrhage, hydrocephalus, extra-axial collection or mass lesion/mass effect. Age congruent  cerebral volume loss. Vascular: No hyperdense vessel or unexpected calcification. Skull: Normal. Negative for fracture or focal lesion. Sinuses/Orbits: Left cataract resection. Minor mucosal thickening in the left maxillary sinus. No maxillary fluid levels. IMPRESSION: No acute finding or explanation for headache. Electronically Signed   By: Monte Fantasia M.D.   On: 09/20/2018 06:10   Mr Virgel Paling WU Contrast  Result Date: 09/20/2018 CLINICAL DATA:  85 year old male with headache weakness and dizziness for 3 days. EXAM: MRA HEAD WITHOUT CONTRAST TECHNIQUE: Angiographic images of the Circle of Willis were obtained using MRA technique without intravenous contrast. COMPARISON:  Brain MRI today reported separately. FINDINGS: Antegrade flow in the posterior circulation. Mildly dominant distal left vertebral artery. No distal vertebral stenosis. Patent right PICA and dominant appearing left AICA origins. Patent vertebrobasilar junction and basilar artery. Normal SCA and PCA origins. Posterior communicating arteries are diminutive or absent. Bilateral PCA branches are within normal limits. Antegrade flow in both ICA siphons. Tortuous bilateral ICAs in the head and upper neck. No siphon stenosis. Patent carotid termini. Normal MCA and ACA origins. Tortuous proximal ACAs. Anterior communicating artery and visible ACA branches are within normal limits. Mildly tortuous MCA M1 segments. Left MCA M1, bifurcation, and visible branches are within normal limits. Right MCA M1, bifurcation, and visible right MCA branches are within normal limits. IMPRESSION: Negative intracranial MRA aside from arterial tortuosity. Electronically Signed   By: Genevie Ann M.D.   On: 09/20/2018 08:31   Mr Brain Wo Contrast  Result Date: 09/20/2018 CLINICAL DATA:  85 year old male with headache weakness and dizziness for 3 days. EXAM: MRI HEAD WITHOUT CONTRAST TECHNIQUE: Multiplanar, multiecho pulse sequences of the brain and surrounding structures  were obtained without intravenous contrast. COMPARISON:  Head CT without contrast earlier today. FINDINGS: Brain: No restricted diffusion to suggest acute infarction. No midline shift, mass effect, evidence of mass lesion, ventriculomegaly, extra-axial collection or acute intracranial hemorrhage. Cervicomedullary junction and pituitary are within normal limits. Pearline Cables and white matter signal is within normal limits for age throughout the brain. No encephalomalacia or chronic cerebral blood products identified. Vascular: Major intracranial vascular flow voids are preserved. Skull and upper cervical spine: Degenerative ligamentous hypertrophy about the odontoid. No destructive bone lesion is evident. Sinuses/Orbits: Postoperative changes to the left globe. Negative right orbit. Mild to moderate ethmoid and left maxillary sinus mucosal thickening. Other: Mastoids are clear. Visible internal auditory structures appear normal. Scalp and face soft tissues appear negative. IMPRESSION: 1. No acute intracranial abnormality. Normal for age noncontrast MRI appearance of  the brain. 2. Mild paranasal sinus disease. Electronically Signed   By: Genevie Ann M.D.   On: 09/20/2018 08:27   Mr Jodene Nam Abdomen W Wo Contrast  Result Date: 09/20/2018 CLINICAL DATA:  85 year old with acute onset of abdominal pain. EXAM: MRI ABDOMEN WITHOUT AND WITH CONTRAST TECHNIQUE: Multiplanar multisequence MR imaging of the abdomen was performed both before and after the administration of intravenous contrast. CONTRAST:  8 mL Gadavist COMPARISON:  Abdominal CT 06/21/2016 FINDINGS: Aorta: Normal caliber of the abdominal aorta without evidence for dissection. Heart size appears to be slightly enlarged. Celiac artery: Origin of the celiac trunk is patent. Common hepatic artery is not confidently identified. SMA: Origin of the SMA is widely patent. Limited evaluation beyond the proximal aspect of the SMA. Renal arteries: Single right renal artery is widely  patent. There are 2 left renal arteries. There is an accessory superior left renal artery. The main left renal artery appears to be patent without significant stenosis. IMA: IMA is patent.  The origin is poorly characterized. Iliac arteries: The common, internal and external iliac arteries are patent bilaterally. Nonvascular structures: No gross abnormality to the liver or gallbladder. No gross abnormality to the kidneys, adrenals or spleen. No gross abnormality to the pancreas. There is motion artifact on this examination. Narrowing of the spinal canal at L3-L4. IMPRESSION: Origin and proximal aspect of the mesenteric arteries are patent without significant stenosis. Single right renal artery and two left renal arteries. No significant stenosis involving the main renal arteries. Electronically Signed   By: Markus Daft M.D.   On: 09/20/2018 08:40    EKG: Independently reviewed. Afib, ILBBB    Time spent:60 minutes Code Status:   FULL CODE Family Communication:  Brother updated at bedside Disposition Plan: expect 2-3 day hospitalization Consults called: cardiology DVT Prophylaxis: [place] Lovenox  Orson Eva, DO  [facility] Pager [phone]  If 7PM-7AM, please contact night-coverage [email or web address] Password [facility] 09/20/2018, 10:49 AM

## 2018-09-20 NOTE — ED Notes (Signed)
Heart monitor removed prior to MRI

## 2018-09-20 NOTE — Consult Note (Addendum)
Cardiology Consult    Patient ID: Dustin Tucker; [id number]; 1932-02-21   Admit date: 09/20/2018 Date of Consult: 09/20/2018  Primary Care Provider: Jani Gravel, MD Primary Cardiologist: Kate Sable, MD   Patient Profile    Dustin Tucker is a 85 y.o. male with past medical history of pSVT with intermittent bradycardia and HTN who is being seen today for the evaluation of palpitations at the request of Dr. Tomi Bamberger.   History of Present Illness    Dustin Tucker was recently examined by Dr. Bronson Ing on 09/07/2018 and had variable heart rate recordings during his office visit as initial vitals showed he was tachycardic with heart rate of 100 bpm and then became bradycardic with auscultation. EKG showed sinus bradycardia with first-degree AV block, heart rate 49.  He was continued on the short acting Cardizem 30 mg daily and a 2-week event monitor was ordered for further evaluation.  In talking with the patient today, he reports having intermittent palpitations over the past several years but symptoms acutely worsened around 0400 this morning when he awoke to go to the restroom. Reports having palpitations, dizziness, and diaphoresis at that time. He also notes intermittent abdominal pain over the past 2 days. Denies any specific diarrhea or constipation.  Was having nausea earlier this morning but no vomiting. Unaware of any sick contacts.  Initial labs show WBC 6.2, Hgb 15.5, platelets 155, Na+ 139, K+ 3.5, and creatinine 1.09. LFT's WNL. Lipase 27.  Initial and cyclic troponin values have been negative. BNP elevated at 390. TSH is pending. CT Head showed no acute intracranial findings. MRI Brain with no acute abnormalities. MRI Abdomen showing patent mesenteric arteries and no significant renal artery stenosis.   EKG appears most consistent with a junctional tachycardia, HR 118 and LBBB. Has been followed on telemetry with intermittent episodes of tachycardia and bradycardia. HR peaks into  the 130's then he has a post-conversion pause (longest being 2.11 seconds thus far) followed by bradycardia with HR in the mid-40's to 50's. Says his last dose of Cardizem was yesterday. IV Lopressor was adminsitered at 0557 today. Did not take any medications this morning prior to his arrival to the ED.   Past Medical History:  Diagnosis Date  . Arthritis    hands  . 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 Hypertension     Past Surgical History:  Procedure Laterality Date  . CARPAL TUNNEL RELEASE Right   . COLONOSCOPY N/A 09/19/2013   Procedure: COLONOSCOPY;  Surgeon: Rogene Houston, MD;  Location: [facility];  Service: Endoscopy;  Laterality: N/A;  830  . FINGER AMPUTATION Left    4th and 5th  . left eye blindness Left      Home Medications:  Prior to Admission medications   Medication Sig Start Date End Date Taking? Authorizing Provider  aspirin EC 81 MG tablet Take 81 mg by mouth daily.    [provider]  cholecalciferol (VITAMIN D) 1000 units tablet Take 1,000 Units by mouth daily.    [provider]  diltiazem (CARDIZEM) 30 MG tablet Take 1 tablet (30 mg total) by mouth daily. 02/16/18   Herminio Commons, MD  hydrochlorothiazide (HYDRODIURIL) 25 MG tablet Take 25 mg by mouth daily.  06/25/16   [provider]  lisinopril (PRINIVIL,ZESTRIL) 40 MG tablet Take 40 mg by mouth daily.  06/09/16   [provider]    Inpatient Medications: Scheduled Meds: . fentaNYL (SUBLIMAZE) injection  50 mcg Intravenous Once   Continuous Infusions:  PRN Meds:   Allergies:    Allergies  Allergen Reactions  . Tamsulosin Hcl Nausea Only  . Aspirin Other (See Comments)    Gi upset, can tolerate baby aspirin    Social History:   Social History   Socioeconomic History  . Marital status: Married    Spouse name: Not on file  . Number of children: Not on file  . Years of education: Not on file  . Highest education level: Not on file    Occupational History  . Not on file  Social Needs  . Financial resource strain: Not on file  . Food insecurity:    Worry: Not on file    Inability: Not on file  . Transportation needs:    Medical: Not on file    Non-medical: Not on file  Tobacco Use  . Smoking status: Former Smoker    Packs/day: 0.50    Types: Cigarettes    Start date: 08/09/1956    Last attempt to quit: 08/10/1979    Years since quitting: 39.1  . Smokeless tobacco: Never Used  Substance and Sexual Activity  . Alcohol use: No    Alcohol/week: 0.0 standard drinks  . Drug use: No  . Sexual activity: Not on file  Lifestyle  . Physical activity:    Days per week: Not on file    Minutes per session: Not on file  . Stress: Not on file  Relationships  . Social connections:    Talks on phone: Not on file    Gets together: Not on file    Attends religious service: Not on file    Active member of club or organization: Not on file    Attends meetings of clubs or organizations: Not on file    Relationship status: Not on file  . Intimate partner violence:    Fear of current or ex partner: Not on file    Emotionally abused: Not on file    Physically abused: Not on file    Forced sexual activity: Not on file  Other Topics Concern  . Not on file  Social History Narrative  . Not on file     Family History:    Family History  Problem Relation Age of Onset  . CAD Mother   . CAD Father   . Cancer Brother       Review of Systems    General:  No chills, fever, night sweats or weight changes.  Cardiovascular:  No chest pain, dyspnea on exertion, edema, orthopnea, paroxysmal nocturnal dyspnea. Positive for palpitations.  Dermatological: No rash, lesions/masses Respiratory: No cough, dyspnea Urologic: No hematuria, dysuria Abdominal:   No vomiting, diarrhea, bright red blood per rectum, melena, or hematemesis. Positive for nausea and abdominal pain.  Neurologic:  No visual changes, wkns, changes in mental  status. All other systems reviewed and are otherwise negative except as noted above.  Physical Exam/Data    Vitals:   09/20/18 0531 09/20/18 0542 09/20/18 0700 09/20/18 0830  BP: (!) 163/109 (!) 131/92 (!) 144/112 (!) 145/109  Pulse: (!) 128 (!) 109 (!) 104 (!) 107  Resp: (!) 21 (!) 24 (!) 21 16  Temp: 98.2 F (36.8 C)     TempSrc: Oral     SpO2: 100% 99% 98% 99%  Weight:      Height:       No intake or output data in the 24 hours ending 09/20/18 [place]  09/20/18 0521  Weight: 74.3 kg   Body mass index is 24.19 kg/m.   General: Pleasant, African American male appearing in NAD Psych: Normal affect. Neuro: Alert and oriented X 3. Moves all extremities spontaneously. HEENT: Normal  Neck: Supple without bruits or JVD. Lungs:  Resp regular and unlabored, CTA without wheezing or rales. Heart: Intermittent tachycardia with ectopic beats appreciated.  no s3, s4, or murmurs. Abdomen: Soft, non-tender, non-distended, BS + x 4.  Extremities: No clubbing, cyanosis or edema. DP/PT/Radials 2+ and equal bilaterally.   Labs/Studies     Relevant CV Studies:  NST: 01/2015  T wave inversion was noted during stress in the inferior and inferolateral leads (II, III, aVF, V5 and V6).  This is a low risk study.  The study is normal.  No myocardial ischemia or infarct.  Laboratory Data:  Chemistry Recent Labs  Lab 09/20/18 0541  NA 139  K 3.5  CL 105  CO2 23  GLUCOSE 129*  BUN 20  CREATININE 1.09  CALCIUM 9.1  GFRNONAA >60  GFRAA >60  ANIONGAP 11    Recent Labs  Lab 09/20/18 0541  PROT 7.1  ALBUMIN 4.0  AST 25  ALT 20  ALKPHOS 62  BILITOT 0.6   Hematology Recent Labs  Lab 09/20/18 0541  WBC 6.2  RBC 5.16  HGB 15.5  HCT 47.6  MCV 92.2  MCH 30.0  MCHC 32.6  RDW 12.6  PLT 155   Cardiac Enzymes Recent Labs  Lab 09/20/18 0541  TROPONINI <0.03   No results for input(s): TROPIPOC in the last 168 hours.  BNP Recent Labs  Lab  09/20/18 0541  BNP 390.0*    DDimer No results for input(s): DDIMER in the last 168 hours.  Radiology/Studies:  Ct Head Wo Contrast  Result Date: 09/20/2018 CLINICAL DATA:  Headache this morning EXAM: CT HEAD WITHOUT CONTRAST TECHNIQUE: Contiguous axial images were obtained from the base of the skull through the vertex without intravenous contrast. COMPARISON:  None available FINDINGS: Brain: No evidence of acute infarction, hemorrhage, hydrocephalus, extra-axial collection or mass lesion/mass effect. Age congruent cerebral volume loss. Vascular: No hyperdense vessel or unexpected calcification. Skull: Normal. Negative for fracture or focal lesion. Sinuses/Orbits: Left cataract resection. Minor mucosal thickening in the left maxillary sinus. No maxillary fluid levels. IMPRESSION: No acute finding or explanation for headache. Electronically Signed   By: Monte Fantasia M.D.   On: 09/20/2018 06:10   Mr Virgel Paling XN Contrast  Result Date: 09/20/2018 CLINICAL DATA:  85 year old male with headache weakness and dizziness for 3 days. EXAM: MRA HEAD WITHOUT CONTRAST TECHNIQUE: Angiographic images of the Circle of Willis were obtained using MRA technique without intravenous contrast. COMPARISON:  Brain MRI today reported separately. FINDINGS: Antegrade flow in the posterior circulation. Mildly dominant distal left vertebral artery. No distal vertebral stenosis. Patent right PICA and dominant appearing left AICA origins. Patent vertebrobasilar junction and basilar artery. Normal SCA and PCA origins. Posterior communicating arteries are diminutive or absent. Bilateral PCA branches are within normal limits. Antegrade flow in both ICA siphons. Tortuous bilateral ICAs in the head and upper neck. No siphon stenosis. Patent carotid termini. Normal MCA and ACA origins. Tortuous proximal ACAs. Anterior communicating artery and visible ACA branches are within normal limits. Mildly tortuous MCA M1 segments. Left MCA M1,  bifurcation, and visible branches are within normal limits. Right MCA M1, bifurcation, and visible right MCA branches are within normal limits. IMPRESSION: Negative intracranial MRA aside from arterial tortuosity. Electronically Signed  By: Genevie Ann M.D.   On: 09/20/2018 08:31   Mr Brain Wo Contrast  Result Date: 09/20/2018 CLINICAL DATA:  85 year old male with headache weakness and dizziness for 3 days. EXAM: MRI HEAD WITHOUT CONTRAST TECHNIQUE: Multiplanar, multiecho pulse sequences of the brain and surrounding structures were obtained without intravenous contrast. COMPARISON:  Head CT without contrast earlier today. FINDINGS: Brain: No restricted diffusion to suggest acute infarction. No midline shift, mass effect, evidence of mass lesion, ventriculomegaly, extra-axial collection or acute intracranial hemorrhage. Cervicomedullary junction and pituitary are within normal limits. Pearline Cables and white matter signal is within normal limits for age throughout the brain. No encephalomalacia or chronic cerebral blood products identified. Vascular: Major intracranial vascular flow voids are preserved. Skull and upper cervical spine: Degenerative ligamentous hypertrophy about the odontoid. No destructive bone lesion is evident. Sinuses/Orbits: Postoperative changes to the left globe. Negative right orbit. Mild to moderate ethmoid and left maxillary sinus mucosal thickening. Other: Mastoids are clear. Visible internal auditory structures appear normal. Scalp and face soft tissues appear negative. IMPRESSION: 1. No acute intracranial abnormality. Normal for age noncontrast MRI appearance of the brain. 2. Mild paranasal sinus disease. Electronically Signed   By: Genevie Ann M.D.   On: 09/20/2018 08:27   Mr Jodene Nam Abdomen W Wo Contrast  Result Date: 09/20/2018 CLINICAL DATA:  85 year old with acute onset of abdominal pain. EXAM: MRI ABDOMEN WITHOUT AND WITH CONTRAST TECHNIQUE: Multiplanar multisequence MR imaging of the abdomen  was performed both before and after the administration of intravenous contrast. CONTRAST:  8 mL Gadavist COMPARISON:  Abdominal CT 06/21/2016 FINDINGS: Aorta: Normal caliber of the abdominal aorta without evidence for dissection. Heart size appears to be slightly enlarged. Celiac artery: Origin of the celiac trunk is patent. Common hepatic artery is not confidently identified. SMA: Origin of the SMA is widely patent. Limited evaluation beyond the proximal aspect of the SMA. Renal arteries: Single right renal artery is widely patent. There are 2 left renal arteries. There is an accessory superior left renal artery. The main left renal artery appears to be patent without significant stenosis. IMA: IMA is patent.  The origin is poorly characterized. Iliac arteries: The common, internal and external iliac arteries are patent bilaterally. Nonvascular structures: No gross abnormality to the liver or gallbladder. No gross abnormality to the kidneys, adrenals or spleen. No gross abnormality to the pancreas. There is motion artifact on this examination. Narrowing of the spinal canal at L3-L4. IMPRESSION: Origin and proximal aspect of the mesenteric arteries are patent without significant stenosis. Single right renal artery and two left renal arteries. No significant stenosis involving the main renal arteries. Electronically Signed   By: Markus Daft M.D.   On: 09/20/2018 08:40     Assessment & Plan    1. Tachy-Brady Syndrome - the patient has a history of pSVT as documented by prior monitors but has recently been experiencing worsening symptoms which led to an outpatient monitor. By personal review of the [employer] website, no strips have been scanned as of yet. He is having intermittent episodes of a narrow-complex and regular tachycardia which appears most consistent with a junctional tachycardia by review of the EKG obtained. He has been having frequent pauses while in the ED with the longest being 2.11 seconds  followed by bradycardia with HR in the mid-40's to 50's. He is still symptomatic at this time with associated palpitations and diaphoresis.  - Electrolytes WNL. TSH pending. Will check Mg. Given his pauses, continue to avoid AV nodal  blocking agents for now. Reviewed with Dr. Harl Bowie and will plan for transfer to [facility] for further evaluation by the Electrophysiology Team. Card Master and EP made aware of transfer.   2. HTN - on Lisinopril 40mg  daily and HCTZ 25mg  daily as an outpatient. Consider IV Hydralazine while in the ED and plan to resume PTA medications at the time of admission. Continue to avoid AV nodal blocking agents.   3. Abdominal Pain - has been occurring for the past 2 days with associated nausea. No diarrhea or constipation. LFT's and Lipase WNL. MRI Abdomen showing no acute findings.  - further evaluation per admitting team.   For questions or updates, please contact [place] Please consult [email or web address] for contact info under Cardiology/STEMI.  Signed, Erma Heritage, PA-C 09/20/2018, 8:59 AM Pager: [phone]   Patient seen and discussed with PA Ahmed Prima, I agree with her documentation. 85 yo male history of PSVT and first degree AV block and HTN presents with palpitations.    From Jan 30,2020 clinic note with Dr Bronson Ing denied any chest pain or palpitations. Intrestingly during that office visit heart rates were variable from dynamap, exam, and EKG with high low heart rates detected.    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 EKGs have show sinus brady to 40s with long first degree AV block as well as a junctional tachycardia. From notes only diltiazem once daily as he attributed joint pains to bid dosing.   Previosuly had done ok from symptoms with his tachy brady, but significant palpitations today. Tachy in ER, with IV  lopressor rates down to 40s. With symptomatic tachy brady syndrome will need to have inpatient EP evaluation to consider pacemaker placment.   Significant abdominal pain, nausea. Dry heaving while I was in the room. MRI abdomen benign. Abdominal symptoms would not tie into his tachy brady.Unclear cause of symptoms, I have asked ER to have patient admitted to medicine service to evaluate. Would ask for transfer to medicine team at [facility] so our inpatient EP staff can evaluate his tachy brady syndrome.    Carlyle Dolly MD

## 2018-09-20 NOTE — ED Provider Notes (Addendum)
Patient evaluated by Dr. Harl Bowie.  He states that patient needs to be admitted by medicine at [facility] so that EP cardiology can see the patient.  Patient with persistent abdominal pain so they did not want to admit him.  MRI of the abdomen was negative he was aware of that.  Patient's tachycardia of heart has improved here with I believe diltiazem.  Will discuss with hospitalist about admission to be arranged at [facility] to the hospital service there.  Dr. Harl Bowie of cardiology will consult on him there  Correction patient was given IV Lopressor had significant bradycardia with that.   Fredia Sorrow, MD 09/20/18 1014    Fredia Sorrow, MD 09/20/18 1016

## 2018-09-20 NOTE — ED Notes (Signed)
Pt returned from MRI °

## 2018-09-21 ENCOUNTER — Encounter (HOSPITAL_COMMUNITY)
Admission: EM | Disposition: A | Discharge status: Home / Self Care | Payer: Self-pay | Source: Home / Self Care | Attending: Family Medicine

## 2018-09-21 ENCOUNTER — Other Ambulatory Visit (HOSPITAL_COMMUNITY): Payer: Medicare HMO

## 2018-09-21 DIAGNOSIS — I471 Supraventricular tachycardia: Principal | ICD-10-CM

## 2018-09-21 HISTORY — PX: SVT ABLATION: EP1225

## 2018-09-21 SURGERY — SVT ABLATION

## 2018-09-21 MED ORDER — HEPARIN (PORCINE) IN NACL 1000-0.9 UT/500ML-% IV SOLN
INTRAVENOUS | Status: DC | PRN
  Administered 2018-09-21 (×2): 500 mL

## 2018-09-21 MED ORDER — FENTANYL CITRATE (PF) 100 MCG/2ML IJ SOLN
INTRAMUSCULAR | Status: DC | PRN
  Administered 2018-09-21: 25 ug via INTRAVENOUS
  Administered 2018-09-21: 12.5 ug via INTRAVENOUS

## 2018-09-21 MED ORDER — HEPARIN (PORCINE) IN NACL 1000-0.9 UT/500ML-% IV SOLN
INTRAVENOUS | Status: AC
  Filled 2018-09-21: qty 500

## 2018-09-21 MED ORDER — FENTANYL CITRATE (PF) 100 MCG/2ML IJ SOLN
INTRAMUSCULAR | Status: AC
  Filled 2018-09-21: qty 2

## 2018-09-21 MED ORDER — ADULT MULTIVITAMIN W/MINERALS CH
1.0000 | ORAL_TABLET | Freq: Every day | ORAL | Status: DC
  Administered 2018-09-22: 1 via ORAL
  Filled 2018-09-21: qty 1

## 2018-09-21 MED ORDER — SODIUM CHLORIDE 0.9 % IV SOLN: 250.0000 mL | INTRAVENOUS | Status: DC | PRN

## 2018-09-21 MED ORDER — BUPIVACAINE HCL (PF) 0.25 % IJ SOLN
INTRAMUSCULAR | Status: AC
  Filled 2018-09-21: qty 30

## 2018-09-21 MED ORDER — BUPIVACAINE HCL (PF) 0.25 % IJ SOLN
INTRAMUSCULAR | Status: DC | PRN
  Administered 2018-09-21: 60 mL

## 2018-09-21 MED ORDER — ISOPROTERENOL HCL 0.2 MG/ML IJ SOLN
INTRAMUSCULAR | Status: AC
  Filled 2018-09-21: qty 5

## 2018-09-21 MED ORDER — SODIUM CHLORIDE 0.9% FLUSH
3.0000 mL | Freq: Two times a day (BID) | INTRAVENOUS | Status: DC
  Administered 2018-09-21: 3 mL via INTRAVENOUS

## 2018-09-21 MED ORDER — SODIUM CHLORIDE 0.9% FLUSH: 3.0000 mL | INTRAVENOUS | Status: DC | PRN

## 2018-09-21 MED ORDER — MIDAZOLAM HCL 5 MG/5ML IJ SOLN
INTRAMUSCULAR | Status: DC | PRN
  Administered 2018-09-21 (×2): 1 mg via INTRAVENOUS

## 2018-09-21 MED ORDER — MIDAZOLAM HCL 5 MG/5ML IJ SOLN
INTRAMUSCULAR | Status: AC
  Filled 2018-09-21: qty 5

## 2018-09-21 MED ORDER — SODIUM CHLORIDE 0.9 % IV SOLN
INTRAVENOUS | Status: AC | PRN
  Administered 2018-09-21: 50 mL/h via INTRAVENOUS

## 2018-09-21 SURGICAL SUPPLY — 11 items
BAG SNAP BAND KOVER 36X36 (MISCELLANEOUS) ×2 IMPLANT
CATH EZ STEER NAV 4MM D-F CUR (ABLATOR) ×2 IMPLANT
CATH JOSEPH QUAD ALLRED 6F REP (CATHETERS) ×6 IMPLANT
CATH WEBSTER BI DIR CS D-F CRV (CATHETERS) ×2 IMPLANT
PACK EP LATEX FREE (CUSTOM PROCEDURE TRAY) ×3
PACK EP LF (CUSTOM PROCEDURE TRAY) ×1 IMPLANT
PAD PRO RADIOLUCENT 2001M-C (PAD) ×3 IMPLANT
PATCH CARTO3 (PAD) ×2 IMPLANT
SHEATH PINNACLE 6F 10CM (SHEATH) ×4 IMPLANT
SHEATH PINNACLE 7F 10CM (SHEATH) ×2 IMPLANT
SHEATH PINNACLE 8F 10CM (SHEATH) ×2 IMPLANT

## 2018-09-21 NOTE — Consult Note (Addendum)
Cardiology Consultation:   Patient ID: Dustin Tucker MRN: [id number]; DOB: [birth year]  Admit date: 09/20/2018 Date of Consult: 09/21/2018  Primary Care Provider: Jani Gravel, MD Primary Cardiologist: Kate Sable, MD  Primary Electrophysiologist:  None    Patient Profile:   Dustin Tucker is a 85 y.o. male with a hx of SVT, HTN, some degree of bradycardia who is being seen today for the evaluation of tachy-brady at the request of Dr. Harl Bowie.  History of Present Illness:   Mr. Reuss last saw Dr. Bronson Ing 09/07/2018, the patient reports episodes of high BP associated with dizziness and separately palpitations.  Dr. Bronson Ing observed with his vital signs recorded by the nurse demonstrated a heart rate of 100 bpm.  When he auscultated him he was bradycardic and then quickly became tachycardic, the patient was could feel this.  An EKG was done his note reports SB, 15, 1st degree AVblock, possible old anteroseptal infarct, and nonspecific T wave abnormalities.  He was not felt to be overly symptomatic, using PRN short acting dilt and scheduled for a 2 week monitor.  The patient sought attention at [facility] with palpitations that acutely became more symptomatic yesterday AM, also reports a few days of abdominal upset/nausea, retching (no vomiting) and pre-ER diarrhea and a couple weeks of URI type symptoms, cough/congestion.  Admitted by IM service, suspected viral gastroenteritis, (tested neg for flu).  Cardiology consulted for observation of SVT with intermittent bradycardia as well. They note observations of telemetry with intermittent episodes of tachycardia and bradycardia. HR peaks into the 130's then he has a post-conversion pause (longest being 2.11 seconds thus far) followed by bradycardia with HR in the mid-40's to 50's. Reporteldy his last dose of Cardizem was Tuesday. IV Lopressor was adminsitered at [facility] yesterday 4259.  The patient reported taking no medicines yesterday morning prior to  his arrival to the ED.  He was transferred to [facility] for further management and EP consultation.  The patient reports his palpitations go back years maybe 3-4.  He is aware of his heart beat but not particularly bothered by it.  He continues to work outside, Database administrator, mows the yard, very active without exertional intolerances.  He denies dizzy spells, no near syncope or syncope.  He reports 5 years ago when he was having trouble with his prostate was in the bathroom and fainted, has not since, and did not recall if he was having any palpitations back then.    Yesterday he got up to go to the BR, suddenly became hot/broke out in a sweat with abd cramping and upset, he denies associated weakness or near syncope with this, went back to lay down and became aware of his heart beat fast (he says "like it does sometimes").  He reports he did not seek attention for his palpitations, but did because of the hot/sudden sweats and nausea.  He has not had any vomiting, no further retching, no further diarrhea.  LABS K+ 3.5 BUN/Creat 20/1.09 Trop I <0.03 x2 WBC 6.2 H/H 15/47 Plts 155 TSH 1.176 BNP 390   Past Medical History:  Diagnosis Date  . Arthritis    hands  . 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 Hypertension     Past Surgical History:  Procedure Laterality Date  . CARPAL TUNNEL RELEASE Right   . COLONOSCOPY N/A 09/19/2013   Procedure: COLONOSCOPY;  Surgeon: Rogene Houston, MD;  Location: [facility] SUITE;  Service: Endoscopy;  Laterality: N/A;  830  .  FINGER AMPUTATION Left    4th and 5th  . left eye blindness Left      Home Medications:  Prior to Admission medications   Medication Sig Start Date End Date Taking? Authorizing Provider  aspirin EC 81 MG tablet Take 81 mg by mouth daily.   Yes [provider]  cholecalciferol (VITAMIN D) 1000 units tablet Take 1,000 Units by mouth daily.   Yes [provider]  diltiazem (CARDIZEM) 30 MG tablet Take 1 tablet (30 mg  total) by mouth daily. 02/16/18  Yes Herminio Commons, MD  hydrochlorothiazide (HYDRODIURIL) 25 MG tablet Take 25 mg by mouth daily.  06/25/16  Yes [provider]  lisinopril (PRINIVIL,ZESTRIL) 40 MG tablet Take 40 mg by mouth daily.  06/09/16  Yes [provider]  cloNIDine (CATAPRES) 0.1 MG tablet Take 0.1 mg by mouth as needed.  05/16/18   [provider]    Inpatient Medications: Scheduled Meds: . aspirin EC  81 mg Oral Daily  . cholecalciferol  1,000 Units Oral Daily  . enoxaparin (LOVENOX) injection  40 mg Subcutaneous Q24H  . feeding supplement (ENSURE ENLIVE)  237 mL Oral BID BM  . lisinopril  40 mg Oral Daily   Continuous Infusions: . 0.9 % NaCl with KCl 20 mEq / L 75 mL/hr at 09/21/18 0313   PRN Meds: acetaminophen **OR** acetaminophen, ondansetron (ZOFRAN) IV, ondansetron **OR** ondansetron (ZOFRAN) IV  Allergies:    Allergies  Allergen Reactions  . Tamsulosin Hcl Nausea Only  . Aspirin Other (See Comments)    Gi upset, can tolerate baby aspirin    Social History:   Social History   Socioeconomic History  . Marital status: Married    Spouse name: Not on file  . Number of children: Not on file  . Years of education: Not on file  . Highest education level: Not on file  Occupational History  . Not on file  Social Needs  . Financial resource strain: Not on file  . Food insecurity:    Worry: Not on file    Inability: Not on file  . Transportation needs:    Medical: Not on file    Non-medical: Not on file  Tobacco Use  . Smoking status: Former Smoker    Packs/day: 0.50    Types: Cigarettes    Start date: 08/09/1956    Last attempt to quit: 08/10/1979    Years since quitting: 39.1  . Smokeless tobacco: Never Used  Substance and Sexual Activity  . Alcohol use: No    Alcohol/week: 0.0 standard drinks  . Drug use: No  . Sexual activity: Not on file  Lifestyle  . Physical activity:    Days per week: Not on file    Minutes per  session: Not on file  . Stress: Not on file  Relationships  . Social connections:    Talks on phone: Not on file    Gets together: Not on file    Attends religious service: Not on file    Active member of club or organization: Not on file    Attends meetings of clubs or organizations: Not on file    Relationship status: Not on file  . Intimate partner violence:    Fear of current or ex partner: Not on file    Emotionally abused: Not on file    Physically abused: Not on file    Forced sexual activity: Not on file  Other Topics Concern  . Not on file  Social History Narrative  . Not on file    Family History:   Family History  Problem Relation Age of Onset  . CAD Mother   . CAD Father   . Cancer Brother      ROS:  Please see the history of present illness.  All other ROS reviewed and negative.     Physical Exam/Data:   Vitals:   09/20/18 2058 09/20/18 2333 09/21/18 0516 09/21/18 0751  BP: (!) 134/106 117/85 (!) 123/96 (!) 121/99  Pulse: (!) 121  (!) 116   Resp:  19    Temp: 97.7 F (36.5 C)  (!) 97.4 F (36.3 C)   TempSrc: Oral  Oral   SpO2: 100%  99%   Weight: 71.4 kg  71.7 kg   Height:        Intake/Output Summary (Last 24 hours) at 09/21/2018 0840 Last data filed at 09/21/2018 1771 Gross per 24 hour  Intake -  Output 275 ml  Net -275 ml   Last 3 Weights 09/21/2018 09/20/2018 09/20/2018  Weight (lbs) 158 lb 1.6 oz 157 lb 6.4 oz 163 lb 12.8 oz  Weight (kg) 71.714 kg 71.396 kg 74.3 kg     Body mass index is 23.35 kg/m.  General:  Well nourished, well developed, in no acute distress HEENT: normal Lymph: no adenopathy Neck: no JVD Endocrine:  No thryomegaly Vascular: No carotid bruits Cardiac:  irreg; no murmurs, gallops or rubs Lungs:  CTA b/l, no wheezing, rhonchi or rales  Abd: soft, nontender  Ext: no edema Musculoskeletal:  No deformities, age appropriate atrophy Skin: warm and dry  Neuro:  No gross focal abnormalities noted Psych:  Normal affect     EKG:  The EKG was personally reviewed and demonstrates:    #1 SVT 124bpm, short RP, PVC #2 is SVT 118bpm, short RP Today SVT 114bpm, short RP  09/07/2018 is SB, 49bpm, PAC, PR is variable, with some suggestion of wenchebach though no dropped beats on this tracing, PR gets as long as 431ms  06/28/17 is SB 46bpm, 1st degree AVblock, PR 232ms  Telemetry:  Telemetry was personally reviewed and demonstrates:   Generally maintaining SVT 110's-120, intermittent SB rates 40's-50's, PVCs occassional, some interrupt the arrhythmia, others do not No marked bradycardia is noted  Relevant CV Studies:  NST: 01/2015  T wave inversion was noted during stress in the inferior and inferolateral leads (II, III, aVF, V5 and V6).  This is a low risk study.  The study is normal.  No myocardial ischemia or infarct.  Laboratory Data:  Chemistry Recent Labs  Lab 09/20/18 0541  NA 139  K 3.5  CL 105  CO2 23  GLUCOSE 129*  BUN 20  CREATININE 1.09  CALCIUM 9.1  GFRNONAA >60  GFRAA >60  ANIONGAP 11    Recent Labs  Lab 09/20/18 0541  PROT 7.1  ALBUMIN 4.0  AST 25  ALT 20  ALKPHOS 62  BILITOT 0.6   Hematology Recent Labs  Lab 09/20/18 0541  WBC 6.2  RBC 5.16  HGB 15.5  HCT 47.6  MCV 92.2  MCH 30.0  MCHC 32.6  RDW 12.6  PLT 155   Cardiac Enzymes Recent Labs  Lab 09/20/18 0541 09/20/18 0928  TROPONINI <0.03 <0.03   No results for input(s): TROPIPOC in the last 168 hours.  BNP Recent Labs  Lab 09/20/18 0541  BNP 390.0*    DDimer No results for input(s): DDIMER in the last 168 hours.  Radiology/Studies:  Dg Chest 2 View Result Date: 09/20/2018 CLINICAL DATA:  Chest pain, cough and congestion. EXAM: CHEST - 2 VIEW COMPARISON:  Chest radiograph June 21, 2016 FINDINGS: Cardiac silhouette is moderately enlarged. Mediastinal silhouette is unremarkable. No pleural effusion or focal consolidation. No pneumothorax. Electronic device projecting over anterior upper  chest likely external to patient. Osseous structures are non suspicious. Faint calcifications about RIGHT shoulder seen with calcific tendinopathy. IMPRESSION: Cardiomegaly.  No acute pulmonary process. Electronically Signed   By: Elon Alas M.D.   On: 09/20/2018 12:38    Ct Head Wo Contrast Result Date: 09/20/2018 CLINICAL DATA:  Headache this morning EXAM: CT HEAD WITHOUT CONTRAST TECHNIQUE: Contiguous axial images were obtained from the base of the skull through the vertex without intravenous contrast. COMPARISON:  None available FINDINGS: Brain: No evidence of acute infarction, hemorrhage, hydrocephalus, extra-axial collection or mass lesion/mass effect. Age congruent cerebral volume loss. Vascular: No hyperdense vessel or unexpected calcification. Skull: Normal. Negative for fracture or focal lesion. Sinuses/Orbits: Left cataract resection. Minor mucosal thickening in the left maxillary sinus. No maxillary fluid levels. IMPRESSION: No acute finding or explanation for headache. Electronically Signed   By: Monte Fantasia M.D.   On: 09/20/2018 06:10    Mr Virgel Paling JK Contrast Result Date: 09/20/2018 CLINICAL DATA:  85 year old male with headache weakness and dizziness for 3 days. EXAM: MRA HEAD WITHOUT CONTRAST TECHNIQUE: Angiographic images of the Circle of Willis were obtained using MRA technique without intravenous contrast. COMPARISON:  Brain MRI today reported separately. FINDINGS: Antegrade flow in the posterior circulation. Mildly dominant distal left vertebral artery. No distal vertebral stenosis. Patent right PICA and dominant appearing left AICA origins. Patent vertebrobasilar junction and basilar artery. Normal SCA and PCA origins. Posterior communicating arteries are diminutive or absent. Bilateral PCA branches are within normal limits. Antegrade flow in both ICA siphons. Tortuous bilateral ICAs in the head and upper neck. No siphon stenosis. Patent carotid termini. Normal MCA and ACA  origins. Tortuous proximal ACAs. Anterior communicating artery and visible ACA branches are within normal limits. Mildly tortuous MCA M1 segments. Left MCA M1, bifurcation, and visible branches are within normal limits. Right MCA M1, bifurcation, and visible right MCA branches are within normal limits. IMPRESSION: Negative intracranial MRA aside from arterial tortuosity. Electronically Signed   By: Genevie Ann M.D.   On: 09/20/2018 08:31    Mr Brain Wo Contrast Result Date: 09/20/2018 CLINICAL DATA:  85 year old male with headache weakness and dizziness for 3 days. EXAM: MRI HEAD WITHOUT CONTRAST TECHNIQUE: Multiplanar, multiecho pulse sequences of the brain and surrounding structures were obtained without intravenous contrast. COMPARISON:  Head CT without contrast earlier today. FINDINGS: Brain: No restricted diffusion to suggest acute infarction. No midline shift, mass effect, evidence of mass lesion, ventriculomegaly, extra-axial collection or acute intracranial hemorrhage. Cervicomedullary junction and pituitary are within normal limits. Pearline Cables and white matter signal is within normal limits for age throughout the brain. No encephalomalacia or chronic cerebral blood products identified. Vascular: Major intracranial vascular flow voids are preserved. Skull and upper cervical spine: Degenerative ligamentous hypertrophy about the odontoid. No destructive bone lesion is evident. Sinuses/Orbits: Postoperative changes to the left globe. Negative right orbit. Mild to moderate ethmoid and left maxillary sinus mucosal thickening. Other: Mastoids are clear. Visible internal auditory structures appear normal. Scalp and face soft tissues appear negative. IMPRESSION: 1. No acute intracranial abnormality. Normal for age noncontrast MRI appearance of the brain. 2. Mild paranasal sinus disease. Electronically Signed   By: Genevie Ann  M.D.   On: 09/20/2018 08:27    Mr Jodene Nam Abdomen W Wo Contrast Result Date: 09/20/2018 CLINICAL  DATA:  85 year old with acute onset of abdominal pain. EXAM: MRI ABDOMEN WITHOUT AND WITH CONTRAST TECHNIQUE: Multiplanar multisequence MR imaging of the abdomen was performed both before and after the administration of intravenous contrast. CONTRAST:  8 mL Gadavist COMPARISON:  Abdominal CT 06/21/2016 FINDINGS: Aorta: Normal caliber of the abdominal aorta without evidence for dissection. Heart size appears to be slightly enlarged. Celiac artery: Origin of the celiac trunk is patent. Common hepatic artery is not confidently identified. SMA: Origin of the SMA is widely patent. Limited evaluation beyond the proximal aspect of the SMA. Renal arteries: Single right renal artery is widely patent. There are 2 left renal arteries. There is an accessory superior left renal artery. The main left renal artery appears to be patent without significant stenosis. IMA: IMA is patent.  The origin is poorly characterized. Iliac arteries: The common, internal and external iliac arteries are patent bilaterally. Nonvascular structures: No gross abnormality to the liver or gallbladder. No gross abnormality to the kidneys, adrenals or spleen. No gross abnormality to the pancreas. There is motion artifact on this examination. Narrowing of the spinal canal at L3-L4. IMPRESSION: Origin and proximal aspect of the mesenteric arteries are patent without significant stenosis. Single right renal artery and two left renal arteries. No significant stenosis involving the main renal arteries. Electronically Signed   By: Markus Daft M.D.   On: 09/20/2018 08:40    Assessment and Plan:   1. Palpitations 2. SVT, looks a short RP tachycardia 3. Sinus bradycardia     1st degree AVblock, some suspicion of Mobitz I, though not clearly      His bradycardia goes back years     Narrow QRS  While I am with him, vagal maneuver does interrupt the tachycardia, he also goes in/out of it spontaneously.  He currently without symptoms, and does not  appreciate his tachycardia currently.  Shelvie Salsberry get an echo Consider if he is an ablation candidate, without clear symptoms of bradycardia, this may be a good 1st step hopefully avoiding PPM   MD Ketan Renz see later today, I don't think we have room on the EP schedule for him, though Carli Lefevers hold his lunch.     For questions or updates, please contact [place] Please consult [email or web address] for contact info under     Signed, Baldwin Jamaica, PA-C  09/21/2018 8:40 AM  I have seen and examined this patient with Tommye Standard.  Agree with above, note added to reflect my findings.  On exam, tachycardic, regular, no murmrs, lungs clear. Patient presetned with SVT. Has bradycardia and thus meds are less likely to help. Plan for ablation  AADIN GAUT has presented today for surgery, with the diagnosis of SVT.  The various methods of treatment have been discussed with the patient and family. After consideration of risks, benefits and other options for treatment, the patient has consented to  Procedure(s): Catheter ablation as a surgical intervention .  Risks include but not limited to bleeding, tamponade, heart block, stroke, damage to surrounding organs, among others. The patient's history has been reviewed, patient examined, no change in status, stable for surgery.  I have reviewed the patient's chart and labs.  Questions were answered to the patient's satisfaction.    Tanaisha Pittman Curt Bears, MD 09/21/2018 2:42 PM

## 2018-09-21 NOTE — Progress Notes (Signed)
Initial Nutrition Assessment  DOCUMENTATION CODES:   Non-severe (moderate) malnutrition in context of chronic illness  INTERVENTION:    When diet advanced, add Ensure Enlive po BID, each supplement provides 350 kcal and 20 grams of protein  Multivitamin daily  NUTRITION DIAGNOSIS:   Moderate Malnutrition related to chronic illness(tachybradycardia syndrome, SVT) as evidenced by mild fat depletion, moderate fat depletion, mild muscle depletion, moderate muscle depletion.  GOAL:   Patient will meet greater than or equal to 90% of their needs  MONITOR:   PO intake, Supplement acceptance, Skin  REASON FOR ASSESSMENT:   Malnutrition Screening Tool    ASSESSMENT:   85 yo male with PMH of paroxysmal SVT, tachybradycardia syndrome, HTN, L eye blindness, arthritis who was admitted with acute onset of palpitations, dizziness, and diaphoresis.   Patient reports that he has been eating well. He was drinking diet juices and other diet beverages, but when he quit drinking them he lost some weight. He typically eats 3 meals per day. His daughter purchases him various supplements, but he usually doesn't care for them. RD encouraged patient to try Ensure Enlive between meals, when diet advanced, to see if he likes it. Encouraged patient to eat well to prevent further weight loss.   Labs reviewed.  Medications reviewed and include vitamin D3, NS with KCl at 75 ml/h.   4% weight loss within the past 2 weeks is not significant for the time frame.   NUTRITION - FOCUSED PHYSICAL EXAM:    Most Recent Value  Orbital Region  Mild depletion  Upper Arm Region  Moderate depletion  Thoracic and Lumbar Region  Mild depletion  Buccal Region  Mild depletion  Temple Region  Mild depletion  Clavicle Bone Region  Moderate depletion  Clavicle and Acromion Bone Region  Mild depletion  Scapular Bone Region  Mild depletion  Dorsal Hand  No depletion  Patellar Region  Mild depletion  Anterior Thigh  Region  Mild depletion  Posterior Calf Region  No depletion  Edema (RD Assessment)  None  Hair  Reviewed  Eyes  Reviewed  Mouth  Reviewed  Skin  Reviewed  Nails  Reviewed       Diet Order:   Diet Order            Diet NPO time specified Except for: Sips with Meds  Diet effective 1000              EDUCATION NEEDS:   No education needs have been identified at this time  Skin:  Skin Assessment: Reviewed RN Assessment  Last BM:  2/11  Height:   Ht Readings from Last 1 Encounters:  09/20/18 5\' 9"  (1.753 m)    Weight:   Wt Readings from Last 1 Encounters:  09/21/18 71.7 kg    Ideal Body Weight:  72.7 kg  BMI:  Body mass index is 23.35 kg/m.  Estimated Nutritional Needs:   Kcal:  2000-2200  Protein:  95-110 gm  Fluid:  2 L    Molli Barrows, RD, LDN, [place] Pager [phone] After Hours Pager [phone]

## 2018-09-21 NOTE — CV Procedure (Signed)
    6 Fr, 8 Fr. from R F/V, and 6 Fr.,7 Fr from L F/V sheaths were pulled manually, and pressure was held for 20 min. on each side. Both groins are soft and non tender. Sterile dressing was applied on each side.  Bed rest started at 1800 x 6 hr.Instructions were given to the patient about his bed rest and restrictions.  HR - 71 irregular. BP - 143/83 sPO2 - 99 5 on R/A

## 2018-09-21 NOTE — Progress Notes (Signed)
PROGRESS NOTE  ADEYEMI Tucker  STM:[id number] DOB: 06-02-1932 DOA: 09/20/2018 PCP: Jani Gravel, MD   Brief Narrative: Dustin Tucker is an 85 y.o. male with a history of PSVT, tachy-brady syndrome, and HTN who presented 2/12 at [facility] with palpitations, dizziness, and diaphoresis in the setting of a couple days of nausea, dry heaving and abdominal pain as well as a week of URI symptoms. In the ED the patient was tachycardic, symptomatic, with SVT. IV metoprolol given with subsequent bradycardia/1st degree AVB with rate into 40's. Cardiology was consulted and recommended transfer to [facility] for EP evaluation. He has continued to have significant tachycardia and bradycardia.  Assessment & Plan: Active Problems:   PSVT (paroxysmal supraventricular tachycardia) (HCC)   Tachy-brady syndrome (HCC)   Nausea and vomiting   Essential hypertension  Tachy-brady syndrome: Symptomatic, though does not have syncope.  - Echocardiogram pending.  - Plan for medical therapy with possible ablation per EP.  Nausea, vomiting: Resolved. BNP up. MRI abd, LFTs, lipase, and flu were negative.  - DC IVF's since he's taking good po. - Zofran prn  Headache: Possibly related to previous URI or dry heaving, ?related to tachy-brady. Brain MRI/MRA without cause identified.  - Tylenol prn  DVT prophylaxis: Lovenox Code Status: Full Family Communication: Brother at bedside Disposition Plan: Home once work up and treatment completed  Consultants:   Electrophysiology  Procedures:   Echocardiogram pending  Antimicrobials:  None   Subjective: Intermittently aware of tachycardia, no current complaints. Eating ok, no diarrhea, nausea or vomiting. No chest pain or dyspnea currently.  Objective: Vitals:   09/20/18 2058 09/20/18 2333 09/21/18 0516 09/21/18 0751  BP: (!) 134/106 117/85 (!) 123/96 (!) 121/99  Pulse: (!) 121  (!) 116   Resp:  19    Temp: 97.7 F (36.5 C)  (!) 97.4 F (36.3 C)   TempSrc: Oral  Oral    SpO2: 100%  99%   Weight: 71.4 kg  71.7 kg   Height:        Intake/Output Summary (Last 24 hours) at 09/21/2018 1346 Last data filed at 09/21/2018 1000 Gross per 24 hour  Intake 240 ml  Output 275 ml  Net -35 ml   Filed Weights   09/20/18 0521 09/20/18 2058 09/21/18 0516  Weight: 74.3 kg 71.4 kg 71.7 kg    Gen: 85 y.o. male in no distress, thin but well-appearing Pulm: Non-labored breathing room air. Clear to auscultation bilaterally.  CV: Regular tachycardia into 130's. No murmur, rub, or gallop. No JVD, no significant pedal edema. GI: Abdomen soft, non-tender, non-distended, with normoactive bowel sounds. No organomegaly or masses felt. Ext: Warm, no deformities Skin: No rashes, lesions or ulcers Neuro: Alert and oriented. No focal neurological deficits. Psych: Judgement and insight appear normal. Mood & affect appropriate.   Data Reviewed: I have personally reviewed following labs and imaging studies  CBC: Recent Labs  Lab 09/20/18 0541  WBC 6.2  NEUTROABS 1.9  HGB 15.5  HCT 47.6  MCV 92.2  PLT 892   Basic Metabolic Panel: Recent Labs  Lab 09/20/18 0541  NA 139  K 3.5  CL 105  CO2 23  GLUCOSE 129*  BUN 20  CREATININE 1.09  CALCIUM 9.1   GFR: Estimated Creatinine Clearance: 50.4 mL/min (by C-G formula based on SCr of 1.09 mg/dL). Liver Function Tests: Recent Labs  Lab 09/20/18 0541  AST 25  ALT 20  ALKPHOS 62  BILITOT 0.6  PROT 7.1  ALBUMIN 4.0   Recent  Labs  Lab 09/20/18 0541  LIPASE 27   No results for input(s): AMMONIA in the last 168 hours. Coagulation Profile: No results for input(s): INR, PROTIME in the last 168 hours. Cardiac Enzymes: Recent Labs  Lab 09/20/18 0541 09/20/18 0928  TROPONINI <0.03 <0.03   BNP (last 3 results) No results for input(s): PROBNP in the last 8760 hours. HbA1C: No results for input(s): HGBA1C in the last 72 hours. CBG: No results for input(s): GLUCAP in the last 168 hours. Lipid Profile: No  results for input(s): CHOL, HDL, LDLCALC, TRIG, CHOLHDL, LDLDIRECT in the last 72 hours. Thyroid Function Tests: Recent Labs    09/20/18 0541  TSH 1.176   Anemia Panel: No results for input(s): VITAMINB12, FOLATE, FERRITIN, TIBC, IRON, RETICCTPCT in the last 72 hours. Urine analysis:    Component Value Date/Time   COLORURINE YELLOW 06/21/2016 1502   APPEARANCEUR CLEAR 06/21/2016 1502   LABSPEC <1.005 (L) 06/21/2016 1502   PHURINE 6.0 06/21/2016 1502   GLUCOSEU NEGATIVE 06/21/2016 1502   HGBUR NEGATIVE 06/21/2016 1502   BILIRUBINUR NEGATIVE 06/21/2016 1502   KETONESUR NEGATIVE 06/21/2016 1502   PROTEINUR NEGATIVE 06/21/2016 1502   UROBILINOGEN 0.2 08/24/2014 1720   NITRITE NEGATIVE 06/21/2016 1502   LEUKOCYTESUR NEGATIVE 06/21/2016 1502   No results found for this or any previous visit (from the past 240 hour(s)).    Radiology Studies: Dg Chest 2 View  Result Date: 09/20/2018 CLINICAL DATA:  Chest pain, cough and congestion. EXAM: CHEST - 2 VIEW COMPARISON:  Chest radiograph June 21, 2016 FINDINGS: Cardiac silhouette is moderately enlarged. Mediastinal silhouette is unremarkable. No pleural effusion or focal consolidation. No pneumothorax. Electronic device projecting over anterior upper chest likely external to patient. Osseous structures are non suspicious. Faint calcifications about RIGHT shoulder seen with calcific tendinopathy. IMPRESSION: Cardiomegaly.  No acute pulmonary process. Electronically Signed   By: Elon Alas M.D.   On: 09/20/2018 12:38   Ct Head Wo Contrast  Result Date: 09/20/2018 CLINICAL DATA:  Headache this morning EXAM: CT HEAD WITHOUT CONTRAST TECHNIQUE: Contiguous axial images were obtained from the base of the skull through the vertex without intravenous contrast. COMPARISON:  None available FINDINGS: Brain: No evidence of acute infarction, hemorrhage, hydrocephalus, extra-axial collection or mass lesion/mass effect. Age congruent cerebral volume  loss. Vascular: No hyperdense vessel or unexpected calcification. Skull: Normal. Negative for fracture or focal lesion. Sinuses/Orbits: Left cataract resection. Minor mucosal thickening in the left maxillary sinus. No maxillary fluid levels. IMPRESSION: No acute finding or explanation for headache. Electronically Signed   By: Monte Fantasia M.D.   On: 09/20/2018 06:10   Mr Virgel Paling NW Contrast  Result Date: 09/20/2018 CLINICAL DATA:  85 year old male with headache weakness and dizziness for 3 days. EXAM: MRA HEAD WITHOUT CONTRAST TECHNIQUE: Angiographic images of the Circle of Willis were obtained using MRA technique without intravenous contrast. COMPARISON:  Brain MRI today reported separately. FINDINGS: Antegrade flow in the posterior circulation. Mildly dominant distal left vertebral artery. No distal vertebral stenosis. Patent right PICA and dominant appearing left AICA origins. Patent vertebrobasilar junction and basilar artery. Normal SCA and PCA origins. Posterior communicating arteries are diminutive or absent. Bilateral PCA branches are within normal limits. Antegrade flow in both ICA siphons. Tortuous bilateral ICAs in the head and upper neck. No siphon stenosis. Patent carotid termini. Normal MCA and ACA origins. Tortuous proximal ACAs. Anterior communicating artery and visible ACA branches are within normal limits. Mildly tortuous MCA M1 segments. Left MCA M1, bifurcation, and visible  branches are within normal limits. Right MCA M1, bifurcation, and visible right MCA branches are within normal limits. IMPRESSION: Negative intracranial MRA aside from arterial tortuosity. Electronically Signed   By: Genevie Ann M.D.   On: 09/20/2018 08:31   Mr Brain Wo Contrast  Result Date: 09/20/2018 CLINICAL DATA:  85 year old male with headache weakness and dizziness for 3 days. EXAM: MRI HEAD WITHOUT CONTRAST TECHNIQUE: Multiplanar, multiecho pulse sequences of the brain and surrounding structures were obtained  without intravenous contrast. COMPARISON:  Head CT without contrast earlier today. FINDINGS: Brain: No restricted diffusion to suggest acute infarction. No midline shift, mass effect, evidence of mass lesion, ventriculomegaly, extra-axial collection or acute intracranial hemorrhage. Cervicomedullary junction and pituitary are within normal limits. Pearline Cables and white matter signal is within normal limits for age throughout the brain. No encephalomalacia or chronic cerebral blood products identified. Vascular: Major intracranial vascular flow voids are preserved. Skull and upper cervical spine: Degenerative ligamentous hypertrophy about the odontoid. No destructive bone lesion is evident. Sinuses/Orbits: Postoperative changes to the left globe. Negative right orbit. Mild to moderate ethmoid and left maxillary sinus mucosal thickening. Other: Mastoids are clear. Visible internal auditory structures appear normal. Scalp and face soft tissues appear negative. IMPRESSION: 1. No acute intracranial abnormality. Normal for age noncontrast MRI appearance of the brain. 2. Mild paranasal sinus disease. Electronically Signed   By: Genevie Ann M.D.   On: 09/20/2018 08:27   Mr Jodene Nam Abdomen W Wo Contrast  Result Date: 09/20/2018 CLINICAL DATA:  85 year old with acute onset of abdominal pain. EXAM: MRI ABDOMEN WITHOUT AND WITH CONTRAST TECHNIQUE: Multiplanar multisequence MR imaging of the abdomen was performed both before and after the administration of intravenous contrast. CONTRAST:  8 mL Gadavist COMPARISON:  Abdominal CT 06/21/2016 FINDINGS: Aorta: Normal caliber of the abdominal aorta without evidence for dissection. Heart size appears to be slightly enlarged. Celiac artery: Origin of the celiac trunk is patent. Common hepatic artery is not confidently identified. SMA: Origin of the SMA is widely patent. Limited evaluation beyond the proximal aspect of the SMA. Renal arteries: Single right renal artery is widely patent. There are 2  left renal arteries. There is an accessory superior left renal artery. The main left renal artery appears to be patent without significant stenosis. IMA: IMA is patent.  The origin is poorly characterized. Iliac arteries: The common, internal and external iliac arteries are patent bilaterally. Nonvascular structures: No gross abnormality to the liver or gallbladder. No gross abnormality to the kidneys, adrenals or spleen. No gross abnormality to the pancreas. There is motion artifact on this examination. Narrowing of the spinal canal at L3-L4. IMPRESSION: Origin and proximal aspect of the mesenteric arteries are patent without significant stenosis. Single right renal artery and two left renal arteries. No significant stenosis involving the main renal arteries. Electronically Signed   By: Markus Daft M.D.   On: 09/20/2018 08:40    Scheduled Meds: . aspirin EC  81 mg Oral Daily  . cholecalciferol  1,000 Units Oral Daily  . enoxaparin (LOVENOX) injection  40 mg Subcutaneous Q24H  . feeding supplement (ENSURE ENLIVE)  237 mL Oral BID BM  . lisinopril  40 mg Oral Daily   Continuous Infusions: . 0.9 % NaCl with KCl 20 mEq / L 75 mL/hr at 09/21/18 0313     LOS: 1 day   Time spent: 25 minutes.  Patrecia Pour, MD [facility] [email or web address] Password [facility] 09/21/2018, 1:46 PM

## 2018-09-21 NOTE — Progress Notes (Signed)
Patient HR too high to perform echo. Will attempt at another time.

## 2018-09-22 ENCOUNTER — Telehealth (HOSPITAL_COMMUNITY): Payer: Self-pay | Admitting: Cardiology

## 2018-09-22 ENCOUNTER — Other Ambulatory Visit (HOSPITAL_COMMUNITY): Payer: Medicare HMO

## 2018-09-22 ENCOUNTER — Encounter (HOSPITAL_COMMUNITY): Payer: Self-pay | Admitting: Cardiology

## 2018-09-22 NOTE — Discharge Instructions (Signed)
Ablacin cardaca Cardiac Ablation  La ablacin cardaca es un procedimiento que se realiza para evitar que parte del tejido cardaco ocasione problemas. El corazn tiene diferentes conexiones elctricas. En algunos casos, estas conexiones hacen que el corazn palpite muy rpido o de manera irregular. Al extirpar alguna de las zonas problemticas, el ritmo cardaco puede mejorar o normalizarse. Qu ocurre antes del procedimiento?  Siga las indicaciones del mdico respecto de lo que no puede comer o beber.  Consulte al mdico si debe hacer o no lo siguiente: ? Cambiar o suspender los medicamentos que toma normalmente. Esto es importante si toma medicamentos para la diabetes o anticoagulantes. ? Tomar medicamentos como aspirina e ibuprofeno. Estos medicamentos pueden tener un efecto anticoagulante en la [place]. No tome estos medicamentos antes del procedimiento si el mdico le indica que no lo haga.  Pdale a alguien que lo lleve a su casa.  Si se ir a su casa inmediatamente despus del procedimiento, planifique que alguien se quede con usted durante 24horas. Qu ocurre durante el procedimiento?  Para reducir el riesgo de infecciones: ? El equipo mdico se lavar o se desinfectar las manos. ? Le lavarn la piel con jabn. ? Posiblemente le rasuren el vello de la ingle o del cuello.  Se le colocar un tubo (catter) intravenoso en una de las venas.  Le administrarn un medicamento para ayudarlo a relajarse (sedante).  Le adormecern la piel del cuello o de la ingle.  Le realizarn un corte (incisin) en el cuello o en la ingle.  A travs del corte, se insertar una aguja en una vena del cuello o de la ingle.  Se colocar un tubo (catter) en la aguja. Se guiar el tubo hasta el corazn. Para ello, se usarn rayos X (fluoroscopia).  El tubo tiene unos dispositivos pequeos (electrodos) en el extremo, que envan corrientes elctricas.  Se administrar un tinte a travs del tubo.  De este modo, el cirujano podr Designer, fashion/clothing.  Se usar Dentist para Archivist (extirpar) parte del tejido [place] usar, por ejemplo: ? Calor (energa de radiofrecuencia). ? Energa lser. ? Fro extremo (crioablacin).  Luego el tubo se retira.  Se ejercer presin sobre el corte. Este procedimiento ayuda a Sales promotion account executive.  Le colocarn una venda (vendaje) sobre el corte. El procedimiento puede variar. Qu sucede despus del procedimiento?  Lo controlarn con frecuencia hasta que el efecto de los medicamentos haya desaparecido.  Le controlarn el corte para verificar que no sangre. Deber permanecer acostado y quieto durante algunas horas.  No conduzca vehculos durante 24horas o el tiempo que le indique el mdico. Resumen  La ablacin cardaca es un procedimiento que se realiza para evitar que parte del tejido cardaco ocasione problemas.  Se usar energa elctrica para Archivist (extirpar) parte del tejido cardaco. Esta informacin no tiene como fin reemplazar el consejo del mdico. Asegrese de hacerle al mdico cualquier pregunta que tenga. Document Released: 03/28/2013 Document Revised: 02/14/2017 Document Reviewed: 02/14/2017 Elsevier Interactive Patient Education  2019 [place]. Femoral Site Care This sheet gives you information about how to care for yourself after your procedure. Your health care provider may also give you more specific instructions. If you have problems or questions, contact your health care provider. What can I expect after the procedure? After the procedure, it is common to have:  Bruising that usually fades within 1-2 weeks.  Tenderness at the site. Follow these instructions at home: Wound care  Follow instructions from your health  care provider about how to take care of your insertion site. Make sure you: ? Wash your hands with soap and water before you change your bandage (dressing). If soap and  water are not available, use hand sanitizer. ? Change your dressing as told by your health care provider. ? Leave stitches (sutures), skin glue, or adhesive strips in place. These skin closures may need to stay in place for 2 weeks or longer. If adhesive strip edges start to loosen and curl up, you may trim the loose edges. Do not remove adhesive strips completely unless your health care provider tells you to do that.  Do not take baths, swim, or use a hot tub until your health care provider approves.  You may shower 24-48 hours after the procedure or as told by your health care provider. ? Gently wash the site with plain soap and water. ? Pat the area dry with a clean towel. ? Do not rub the site. This may cause bleeding.  Do not apply powder or lotion to the site. Keep the site clean and dry.  Check your femoral site every day for signs of infection. Check for: ? Redness, swelling, or pain. ? Fluid or blood. ? Warmth. ? Pus or a bad smell. Activity  For the first 2-3 days after your procedure, or as long as directed: ? Avoid climbing stairs as much as possible. ? Do not squat.  Do not lift anything that is heavier than 10 lb (4.5 kg), or the limit that you are told, until your health care provider says that it is safe.  Rest as directed. ? Avoid sitting for a long time without moving. Get up to take short walks every 1-2 hours.  Do not drive for 24 hours if you were given a medicine to help you relax (sedative). General instructions  Take over-the-counter and prescription medicines only as told by your health care provider.  Keep all follow-up visits as told by your health care provider. This is important. Contact a health care provider if you have:  A fever or chills.  You have redness, swelling, or pain around your insertion site. Get help right away if:  The catheter insertion area swells very fast.  You pass out.  You suddenly start to sweat or your skin gets  clammy.  The catheter insertion area is bleeding, and the bleeding does not stop when you hold steady pressure on the area.  The area near or just beyond the catheter insertion site becomes pale, cool, tingly, or numb. These symptoms may represent a serious problem that is an emergency. Do not wait to see if the symptoms will go away. Get medical help right away. Call your local emergency services (911 in the [place] Do not drive yourself to the hospital. Summary  After the procedure, it is common to have bruising that usually fades within 1-2 weeks.  Check your femoral site every day for signs of infection.  Do not lift anything that is heavier than 10 lb (4.5 kg), or the limit that you are told, until your health care provider says that it is safe. This information is not intended to replace advice given to you by your health care provider. Make sure you discuss any questions you have with your health care provider. Document Released: 03/29/2014 Document Revised: 08/08/2017 Document Reviewed: 08/08/2017 Elsevier Interactive Patient Education  2019 [place] procedure care instructions No driving for 4 days. No lifting over 5 lbs for 1 week. No  vigorous or sexual activity for 1 week. You may return to work on 09/28/2018. Keep procedure site clean & dry. If you notice increased pain, swelling, bleeding or pus, call/return!  You may shower, but no soaking baths/hot tubs/pools for 1 week.

## 2018-09-22 NOTE — Progress Notes (Addendum)
Progress Note  Patient Name: Dustin Tucker Date of Encounter: 09/22/2018  Primary Cardiologist: Kate Sable, MD   Subjective   No CP, palpitations or SOB  Inpatient Medications    Scheduled Meds: . aspirin EC  81 mg Oral Daily  . cholecalciferol  1,000 Units Oral Daily  . enoxaparin (LOVENOX) injection  40 mg Subcutaneous Q24H  . feeding supplement (ENSURE ENLIVE)  237 mL Oral BID BM  . lisinopril  40 mg Oral Daily  . multivitamin with minerals  1 tablet Oral Daily  . sodium chloride flush  3 mL Intravenous Q12H   Continuous Infusions: . sodium chloride     PRN Meds: sodium chloride, acetaminophen **OR** acetaminophen, ondansetron (ZOFRAN) IV, ondansetron **OR** ondansetron (ZOFRAN) IV, sodium chloride flush   Vital Signs    Vitals:   09/21/18 1843 09/21/18 1848 09/21/18 2058 09/22/18 0405  BP: (!) 136/101 (!) 157/104 (!) 141/99 129/82  Pulse: 78 77 68 70  Resp: 18 18 18 19   Temp:  (!) 97.3 F (36.3 C) (!) 97.5 F (36.4 C) 97.6 F (36.4 C)  TempSrc:  Oral Oral Axillary  SpO2: 100% 100% 100% 100%  Weight:    73 kg  Height:        Intake/Output Summary (Last 24 hours) at 09/22/2018 0828 Last data filed at 09/21/2018 1000 Gross per 24 hour  Intake 240 ml  Output -  Net 240 ml   Last 3 Weights 09/22/2018 09/21/2018 09/20/2018  Weight (lbs) 161 lb 158 lb 1.6 oz 157 lb 6.4 oz  Weight (kg) 73.029 kg 71.714 kg 71.396 kg      Telemetry    SR, at times frequent PACs, occassionally blocked PACs, infrequent PVC - Personally Reviewed  ECG    SR, 1st degree AVblock - Personally Reviewed  Physical Exam   GEN: No acute distress.   Neck: No JVD Cardiac: RRR, extrasystoles, no murmurs, rubs, or gallops.  Respiratory: CTA b/l. GI: Soft, nontender, non-distended  MS: No edema; No deformity. Neuro:  Nonfocal  Psych: Normal affect   R groin, is soft, nontender, no bleeding or hematoma  Labs    Chemistry Recent Labs  Lab 09/20/18 0541  NA 139  K 3.5   CL 105  CO2 23  GLUCOSE 129*  BUN 20  CREATININE 1.09  CALCIUM 9.1  PROT 7.1  ALBUMIN 4.0  AST 25  ALT 20  ALKPHOS 62  BILITOT 0.6  GFRNONAA >60  GFRAA >60  ANIONGAP 11     Hematology Recent Labs  Lab 09/20/18 0541  WBC 6.2  RBC 5.16  HGB 15.5  HCT 47.6  MCV 92.2  MCH 30.0  MCHC 32.6  RDW 12.6  PLT 155    Cardiac Enzymes Recent Labs  Lab 09/20/18 0541 09/20/18 0928  TROPONINI <0.03 <0.03   No results for input(s): TROPIPOC in the last 168 hours.   BNP Recent Labs  Lab 09/20/18 0541  BNP 390.0*     DDimer No results for input(s): DDIMER in the last 168 hours.   Radiology    09/21/2018, Dr. Curt Bears CONCLUSIONS:  1. Sinus rhythm upon presentation.  2. 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 Successful radiofrequency modification of the slow AV nodal pathway  4. No inducible arrhythmias following ablation.  5. No early apparent complications.   Dg Chest 2 View Result Date: 09/20/2018 CLINICAL DATA:  Chest  pain, cough and congestion. EXAM: CHEST - 2 VIEW COMPARISON:  Chest radiograph June 21, 2016 FINDINGS: Cardiac silhouette is moderately enlarged. Mediastinal silhouette is unremarkable. No pleural effusion or focal consolidation. No pneumothorax. Electronic device projecting over anterior upper chest likely external to patient. Osseous structures are non suspicious. Faint calcifications about RIGHT shoulder seen with calcific tendinopathy. IMPRESSION: Cardiomegaly.  No acute pulmonary process. Electronically Signed   By: Elon Alas M.D.   On: 09/20/2018 12:38    Cardiac Studies   NST: 01/2015  T wave inversion was noted during stress in the inferior and inferolateral leads (II, III, aVF, V5 and V6).  This is a low risk study.  The study is normal.  No myocardial ischemia or infarct.  Patient Profile       85 y.o. male with a hx of SVT, HTN, some degree of bradycardia, admitted with nausea, GI complaints, and what sounded like his baseline longstanding Palpitations.   Assessment & Plan    1. Palpitations 2. SVT, looks a short RP tachycardia 3. Sinus bradycardia     1st degree AVblock, some suspicion of Mobitz I, though not clearly      His bradycardia goes back years     Narrow QRS  The patient is now s/p EPS, ablation Tele is reviewed PACs, blocked PACs, rates 60's-70's, no recurrent SVT, infrequent PVCs Procedure site is stable Activity/site care discussed with the patient Dr. Curt Bears has seen and examined the patient EP follow up is in place Echo can be done out patient OK for discharge from our perspective  Stop out patient PRN diltiazem, no other recommendations    For questions or updates, please contact [facility] Please consult [email or web address] for contact info under        Signed, Baldwin Jamaica, PA-C  09/22/2018, 8:28 AM    I have seen and examined this patient with Tommye Standard.  Agree with above, note added to reflect my findings.  On exam, RRR, no murmurs, lungs clear.  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  He has remained in sinus rhythm since that time.  No further arrhythmias noted.  He has had a high burden of PACs and junctional rhythm.  At this point since he is asymptomatic, no further therapy.  Okay to discharge.  Follow-up Alaa Mullally be arranged.  EP to sign off.  [facility]rum Carbonell sign off.   Medication Recommendations:  Stop prn diltiazem Other recommendations (labs, testing, etc):  none Follow up as an outpatient:  Arranged with EP   Rojelio Uhrich M. Sage Kopera MD 09/22/2018 8:48 AM

## 2018-09-22 NOTE — Discharge Summary (Signed)
Physician Discharge Summary  Dustin Tucker XFG:[id number] DOB: Aug 23, 1931 DOA: 09/20/2018  PCP: Jani Gravel, MD  Admit date: 09/20/2018 Discharge date: 09/22/2018  Admitted From: Home Disposition: Home  Recommendations for Outpatient Follow-up:  1. Follow up with PCP in 1-2 weeks 2. Please obtain BMP/CBC in one week your next doctors visit.  3. Follow-up outpatient with EP cardiology in about 4 weeks 4. Follow-up outpatient with primary cardiologist in about 2 weeks 5. Discontinued Cardizem per cardiology recommendations  Home Health:None  Equipment/Devices:None  Discharge Condition: Stable CODE STATUS: Full Code  Diet recommendation: 2g Na  Brief/Interim Summary: 85 year old with past medical history of SVT, essential hypertension, some degree of bradycardia but mostly asymptomatic initially felt some palpitation type episodes about 2 weeks ago and it was determined to observe this but later was noted to be bradycardic as well.  EKG showed first-degree AV nodal block.  He was given as needed Cardizem and was scheduled for a two-week monitor.  During this hospitalization he developed some palpitation, some nausea therefore came to the ER.  He also reported some URI type symptoms.  Off-and-on he had elevated heart rate with some intermittent bradycardia therefore cardiology was consulted.  For further evaluation they recommended transferring the patient to [facility] for EP evaluation.  Patient was evaluated by EP cardiology who recommended discontinuing AV nodal blocker for now and following up outpatient as patient mostly remained asymptomatic. Work-up for nausea and vomiting was negative during the hospitalization and over 48 hours his p.o. intake improved and was able to tolerate oral diet without any issues.  Patient had undergone MRI of the abdomen which was negative for any acute pathology.  Also MRI of the brain which was negative. Today he is reached maximal benefit from  hospital stay and stable for discharge with outpatient follow-up recommendations as stated above.   Discharge Diagnoses:  Active Problems:   PSVT (paroxysmal supraventricular tachycardia) (HCC)   Tachy-brady syndrome (HCC)   Nausea and vomiting   Essential hypertension  Tachybradycardia syndrome Paroxysmal supraventricular tachycardia - Patient has been seen by cardiology and also electrophysiology.  Recommended discontinuing Cardizem and following up outpatient.  After patient got transferred from any pain hospital to [facility], his heart rate has remained steady and he has mostly remained asymptomatic.  Continue to provide supportive care and follow-up outpatient.  Nausea and vomiting, resolved -During the hospitalization he had an extensive work-up including MRI of the abdomen which was negative.  LFTs, lipase was negative.  Influenza was negative.  Today he is tolerating oral diet without any issues.  Generalized headache; resolved -Likely related to possible URI and dehydration.  MRI of the brain is negative.  Essential hypertension -Resume home diet and medications.  Recommend 2 g salt diet.  On Lovenox for DVT prophylaxis during the hospitalization Full code Stable for discharge today with outpatient follow-up.  Discharge Instructions   Allergies as of 09/22/2018      Reactions   Tamsulosin Hcl Nausea Only   Aspirin Other (See Comments)   Gi upset, can tolerate baby aspirin      Medication List    STOP taking these medications   diltiazem 30 MG tablet Commonly known as:  CARDIZEM     TAKE these medications   aspirin EC 81 MG tablet Take 81 mg by mouth daily.   cholecalciferol 1000 units tablet Commonly known as:  VITAMIN D Take 1,000 Units by mouth daily.   cloNIDine 0.1 MG tablet Commonly known as:  CATAPRES Take 0.1 mg by mouth as needed.   hydrochlorothiazide 25 MG tablet Commonly known as:  HYDRODIURIL Take 25 mg by mouth daily.    lisinopril 40 MG tablet Commonly known as:  PRINIVIL,ZESTRIL Take 40 mg by mouth daily.      Follow-up Information    Constance Haw, MD Follow up.   Specialty:  Cardiology Why:  10/27/2018 @ 9:15AM Contact information: [address] [place] [address] [phone]        Jani Gravel, MD. Schedule an appointment as soon as possible for a visit in 1 week(s).   Specialty:  Internal Medicine Contact information: [address] [place] [address] [phone]        Herminio Commons, MD Follow up in 2 week(s).   Specialty:  Cardiology Contact information: [place] [address] [phone]          Allergies  Allergen Reactions  . Tamsulosin Hcl Nausea Only  . Aspirin Other (See Comments)    Gi upset, can tolerate baby aspirin    You were cared for by a hospitalist during your hospital stay. If you have any questions about your discharge medications or the care you received while you were in the hospital after you are discharged, you can call the unit and asked to speak with the hospitalist on call if the hospitalist that took care of you is not available. Once you are discharged, your primary care physician will handle any further medical issues. Please note that no refills for any discharge medications will be authorized once you are discharged, as it is imperative that you return to your primary care physician (or establish a relationship with a primary care physician if you do not have one) for your aftercare needs so that they can reassess your need for medications and monitor your lab values.  Consultations:  Cardiology-General and Electrophysiology   Procedures/Studies: Dg Chest 2 View  Result Date: 09/20/2018 CLINICAL DATA:  Chest pain, cough and congestion. EXAM: CHEST - 2 VIEW COMPARISON:  Chest radiograph June 21, 2016 FINDINGS: Cardiac silhouette is moderately enlarged. Mediastinal silhouette is  unremarkable. No pleural effusion or focal consolidation. No pneumothorax. Electronic device projecting over anterior upper chest likely external to patient. Osseous structures are non suspicious. Faint calcifications about RIGHT shoulder seen with calcific tendinopathy. IMPRESSION: Cardiomegaly.  No acute pulmonary process. Electronically Signed   By: Elon Alas M.D.   On: 09/20/2018 12:38   Ct Head Wo Contrast  Result Date: 09/20/2018 CLINICAL DATA:  Headache this morning EXAM: CT HEAD WITHOUT CONTRAST TECHNIQUE: Contiguous axial images were obtained from the base of the skull through the vertex without intravenous contrast. COMPARISON:  None available FINDINGS: Brain: No evidence of acute infarction, hemorrhage, hydrocephalus, extra-axial collection or mass lesion/mass effect. Age congruent cerebral volume loss. Vascular: No hyperdense vessel or unexpected calcification. Skull: Normal. Negative for fracture or focal lesion. Sinuses/Orbits: Left cataract resection. Minor mucosal thickening in the left maxillary sinus. No maxillary fluid levels. IMPRESSION: No acute finding or explanation for headache. Electronically Signed   By: Monte Fantasia M.D.   On: 09/20/2018 06:10   Mr Virgel Paling ZY Contrast  Result Date: 09/20/2018 CLINICAL DATA:  85 year old male with headache weakness and dizziness for 3 days. EXAM: MRA HEAD WITHOUT CONTRAST TECHNIQUE: Angiographic images of the Circle of Willis were obtained using MRA technique without intravenous contrast. COMPARISON:  Brain MRI today reported separately. FINDINGS: Antegrade flow in the  posterior circulation. Mildly dominant distal left vertebral artery. No distal vertebral stenosis. Patent right PICA and dominant appearing left AICA origins. Patent vertebrobasilar junction and basilar artery. Normal SCA and PCA origins. Posterior communicating arteries are diminutive or absent. Bilateral PCA branches are within normal limits. Antegrade flow in both ICA  siphons. Tortuous bilateral ICAs in the head and upper neck. No siphon stenosis. Patent carotid termini. Normal MCA and ACA origins. Tortuous proximal ACAs. Anterior communicating artery and visible ACA branches are within normal limits. Mildly tortuous MCA M1 segments. Left MCA M1, bifurcation, and visible branches are within normal limits. Right MCA M1, bifurcation, and visible right MCA branches are within normal limits. IMPRESSION: Negative intracranial MRA aside from arterial tortuosity. Electronically Signed   By: Genevie Ann M.D.   On: 09/20/2018 08:31   Mr Brain Wo Contrast  Result Date: 09/20/2018 CLINICAL DATA:  85 year old male with headache weakness and dizziness for 3 days. EXAM: MRI HEAD WITHOUT CONTRAST TECHNIQUE: Multiplanar, multiecho pulse sequences of the brain and surrounding structures were obtained without intravenous contrast. COMPARISON:  Head CT without contrast earlier today. FINDINGS: Brain: No restricted diffusion to suggest acute infarction. No midline shift, mass effect, evidence of mass lesion, ventriculomegaly, extra-axial collection or acute intracranial hemorrhage. Cervicomedullary junction and pituitary are within normal limits. Pearline Cables and white matter signal is within normal limits for age throughout the brain. No encephalomalacia or chronic cerebral blood products identified. Vascular: Major intracranial vascular flow voids are preserved. Skull and upper cervical spine: Degenerative ligamentous hypertrophy about the odontoid. No destructive bone lesion is evident. Sinuses/Orbits: Postoperative changes to the left globe. Negative right orbit. Mild to moderate ethmoid and left maxillary sinus mucosal thickening. Other: Mastoids are clear. Visible internal auditory structures appear normal. Scalp and face soft tissues appear negative. IMPRESSION: 1. No acute intracranial abnormality. Normal for age noncontrast MRI appearance of the brain. 2. Mild paranasal sinus disease.  Electronically Signed   By: Genevie Ann M.D.   On: 09/20/2018 08:27   Mr Jodene Nam Abdomen W Wo Contrast  Result Date: 09/20/2018 CLINICAL DATA:  85 year old with acute onset of abdominal pain. EXAM: MRI ABDOMEN WITHOUT AND WITH CONTRAST TECHNIQUE: Multiplanar multisequence MR imaging of the abdomen was performed both before and after the administration of intravenous contrast. CONTRAST:  8 mL Gadavist COMPARISON:  Abdominal CT 06/21/2016 FINDINGS: Aorta: Normal caliber of the abdominal aorta without evidence for dissection. Heart size appears to be slightly enlarged. Celiac artery: Origin of the celiac trunk is patent. Common hepatic artery is not confidently identified. SMA: Origin of the SMA is widely patent. Limited evaluation beyond the proximal aspect of the SMA. Renal arteries: Single right renal artery is widely patent. There are 2 left renal arteries. There is an accessory superior left renal artery. The main left renal artery appears to be patent without significant stenosis. IMA: IMA is patent.  The origin is poorly characterized. Iliac arteries: The common, internal and external iliac arteries are patent bilaterally. Nonvascular structures: No gross abnormality to the liver or gallbladder. No gross abnormality to the kidneys, adrenals or spleen. No gross abnormality to the pancreas. There is motion artifact on this examination. Narrowing of the spinal canal at L3-L4. IMPRESSION: Origin and proximal aspect of the mesenteric arteries are patent without significant stenosis. Single right renal artery and two left renal arteries. No significant stenosis involving the main renal arteries. Electronically Signed   By: Markus Daft M.D.   On: 09/20/2018 08:40     Subjective: Feels better  no complaints.  Currently he is asymptomatic.   Discharge Exam: Vitals:   09/22/18 0700 09/22/18 0832  BP:  (!) 142/86  Pulse:    Resp:  (!) 21  Temp:    SpO2: 97%    Vitals:   09/22/18 0405 09/22/18 0600 09/22/18 0700  09/22/18 0832  BP: 129/82   (!) 142/86  Pulse: 70     Resp: 19   (!) 21  Temp: 97.6 F (36.4 C)     TempSrc: Axillary     SpO2: 100% 98% 97%   Weight: 73 kg     Height:        General: Pt is alert, awake, not in acute distress Cardiovascular: RRR, S1/S2 +, no rubs, no gallops Respiratory: CTA bilaterally, no wheezing, no rhonchi Abdominal: Soft, NT, ND, bowel sounds + Extremities: no edema, no cyanosis    The results of significant diagnostics from this hospitalization (including imaging, microbiology, ancillary and laboratory) are listed below for reference.     Microbiology: No results found for this or any previous visit (from the past 240 hour(s)).   Labs: BNP (last 3 results) Recent Labs    09/20/18 0541  BNP 836.6*   Basic Metabolic Panel: Recent Labs  Lab 09/20/18 0541  NA 139  K 3.5  CL 105  CO2 23  GLUCOSE 129*  BUN 20  CREATININE 1.09  CALCIUM 9.1   Liver Function Tests: Recent Labs  Lab 09/20/18 0541  AST 25  ALT 20  ALKPHOS 62  BILITOT 0.6  PROT 7.1  ALBUMIN 4.0   Recent Labs  Lab 09/20/18 0541  LIPASE 27   No results for input(s): AMMONIA in the last 168 hours. CBC: Recent Labs  Lab 09/20/18 0541  WBC 6.2  NEUTROABS 1.9  HGB 15.5  HCT 47.6  MCV 92.2  PLT 155   Cardiac Enzymes: Recent Labs  Lab 09/20/18 0541 09/20/18 0928  TROPONINI <0.03 <0.03   BNP: Invalid input(s): POCBNP CBG: No results for input(s): GLUCAP in the last 168 hours. D-Dimer No results for input(s): DDIMER in the last 72 hours. Hgb A1c No results for input(s): HGBA1C in the last 72 hours. Lipid Profile No results for input(s): CHOL, HDL, LDLCALC, TRIG, CHOLHDL, LDLDIRECT in the last 72 hours. Thyroid function studies Recent Labs    09/20/18 0541  TSH 1.176   Anemia work up No results for input(s): VITAMINB12, FOLATE, FERRITIN, TIBC, IRON, RETICCTPCT in the last 72 hours. Urinalysis    Component Value Date/Time   COLORURINE YELLOW  06/21/2016 1502   APPEARANCEUR CLEAR 06/21/2016 1502   LABSPEC <1.005 (L) 06/21/2016 1502   PHURINE 6.0 06/21/2016 1502   GLUCOSEU NEGATIVE 06/21/2016 1502   HGBUR NEGATIVE 06/21/2016 1502   BILIRUBINUR NEGATIVE 06/21/2016 1502   KETONESUR NEGATIVE 06/21/2016 1502   PROTEINUR NEGATIVE 06/21/2016 1502   UROBILINOGEN 0.2 08/24/2014 1720   NITRITE NEGATIVE 06/21/2016 1502   LEUKOCYTESUR NEGATIVE 06/21/2016 1502   Sepsis Labs Invalid input(s): PROCALCITONIN,  WBC,  LACTICIDVEN Microbiology No results found for this or any previous visit (from the past 240 hour(s)).   Time coordinating discharge:  I have spent 35 minutes face to face with the patient and on the ward discussing the patients care, assessment, plan and disposition with other care givers. >50% of the time was devoted counseling the patient about the risks and benefits of treatment/Discharge disposition and coordinating care.   SIGNED:   Damita Lack, MD  [facility] 09/22/2018, 10:36 AM  If 7PM-7AM, please contact night-coverage [email or web address]

## 2018-09-22 NOTE — Care Management Important Message (Signed)
Important Message  Patient Details  Name: Dustin Tucker MRN: [id number] Date of Birth: [birth year]   Medicare Important Message Given:  Yes    Quetzally Callas P Fady Stamps 09/22/2018, 11:54 AM

## 2018-09-22 NOTE — Progress Notes (Signed)
Discharge instructions reviewed with pt. Pt has no questions at this time. IV d/c. Groins level 0. Pt states he is ready for d/c. Waiting on son to pick up pt.

## 2018-09-22 NOTE — CV Procedure (Signed)
Echocardiogram Cancelled per Dustin Tucker will do as OP.  Dustin Tucker RDCS

## 2018-09-22 NOTE — Consult Note (Signed)
            [facility] CM Primary Care Navigator  09/22/2018  Dustin Tucker 06-27-1932 [id number]   Went to seepatient in the room to identify possible discharge needs buthe wasdischarged homeper staff.  Per MD note, patient was admitted for paroxysmal supraventricular tachycardia, tachy-brady syndrome, nausea and vomiting, essential hypertension (status post ablation).  Patient has discharge instruction to follow-up withprimary care provider in 1 week and cardiology follow-up in 2 weeks.  Primary care provider's office is listed as providing transition of care (TOC).    For additional questions please contact:  Edwena Felty A. Lyndsey Demos, BSN, RN-BC [facility] PRIMARY CARE Navigator Cell: [phone]

## 2018-09-26 ENCOUNTER — Other Ambulatory Visit: Payer: Self-pay

## 2018-09-26 DIAGNOSIS — E7439 Other disorders of intestinal carbohydrate absorption: Secondary | ICD-10-CM | POA: Diagnosis not present

## 2018-09-26 DIAGNOSIS — I471 Supraventricular tachycardia: Secondary | ICD-10-CM | POA: Diagnosis not present

## 2018-09-26 DIAGNOSIS — R001 Bradycardia, unspecified: Secondary | ICD-10-CM | POA: Diagnosis not present

## 2018-09-26 DIAGNOSIS — Z79899 Other long term (current) drug therapy: Secondary | ICD-10-CM | POA: Diagnosis not present

## 2018-09-26 DIAGNOSIS — I10 Essential (primary) hypertension: Secondary | ICD-10-CM | POA: Diagnosis not present

## 2018-09-26 NOTE — Patient Outreach (Signed)
[facility]) Care Management  09/26/2018  Dustin Tucker [birth year] [id number]   Patient discharged from [facility] 09/22/2018.  Referral received. No outreach warranted at this time. Transition of Care  will be completed by primary care provider office who will refer to [facility] care management if needed.  Plan: RN CM will close case.  Jone Baseman, RN, MSN [facility] Care Management Coordinator Direct Line [phone] Cell [phone] Toll Free: [phone]  Fax: [phone]

## 2018-09-27 DIAGNOSIS — I10 Essential (primary) hypertension: Secondary | ICD-10-CM | POA: Diagnosis not present

## 2018-09-27 DIAGNOSIS — E7439 Other disorders of intestinal carbohydrate absorption: Secondary | ICD-10-CM | POA: Diagnosis not present

## 2018-09-28 NOTE — Addendum Note (Signed)
Addended by: Levonne Hubert on: 09/28/2018 10:22 AM   Modules accepted: Orders

## 2018-10-02 ENCOUNTER — Encounter: Payer: Self-pay | Admitting: Cardiovascular Disease

## 2018-10-02 ENCOUNTER — Ambulatory Visit: Payer: Medicare HMO | Admitting: Cardiovascular Disease

## 2018-10-02 VITALS — BP 134/74 | HR 87 | Ht 69.0 in | Wt 156.0 lb

## 2018-10-02 DIAGNOSIS — I10 Essential (primary) hypertension: Secondary | ICD-10-CM | POA: Diagnosis not present

## 2018-10-02 DIAGNOSIS — I471 Supraventricular tachycardia: Secondary | ICD-10-CM | POA: Diagnosis not present

## 2018-10-02 DIAGNOSIS — Z8679 Personal history of other diseases of the circulatory system: Secondary | ICD-10-CM | POA: Diagnosis not present

## 2018-10-02 DIAGNOSIS — Z9889 Other specified postprocedural states: Secondary | ICD-10-CM | POA: Diagnosis not present

## 2018-10-02 NOTE — Progress Notes (Signed)
SUBJECTIVE: The patient presents for posthospitalization follow-up.  He recently underwent radiofrequency ablation for AVNRT.  The patient denies any symptoms of chest pain, palpitations, shortness of breath, lightheadedness, dizziness, leg swelling, orthopnea, PND, and syncope.    Review of Systems: As per "subjective", otherwise negative.  Allergies  Allergen Reactions  . Tamsulosin Hcl Nausea Only  . Aspirin Other (See Comments)    Gi upset, can tolerate baby aspirin    Current Outpatient Medications  Medication Sig Dispense Refill  . aspirin EC 81 MG tablet Take 81 mg by mouth daily.    . cholecalciferol (VITAMIN D) 1000 units tablet Take 1,000 Units by mouth daily.    . cloNIDine (CATAPRES) 0.1 MG tablet Take 0.1 mg by mouth as needed.     . hydrochlorothiazide (HYDRODIURIL) 25 MG tablet Take 25 mg by mouth daily.     Marland Kitchen lisinopril (PRINIVIL,ZESTRIL) 40 MG tablet Take 40 mg by mouth daily.      No current facility-administered medications for this visit.     Past Medical History:  Diagnosis Date  . Arthritis    hands  . 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 Hypertension     Past Surgical History:  Procedure Laterality Date  . CARPAL TUNNEL RELEASE Right   . COLONOSCOPY N/A 09/19/2013   Procedure: COLONOSCOPY;  Surgeon: Rogene Houston, MD;  Location: [facility];  Service: Endoscopy;  Laterality: N/A;  830  . FINGER AMPUTATION Left    4th and 5th  . left eye blindness Left   . SVT ABLATION N/A 09/21/2018   Procedure: SVT ABLATION;  Surgeon: Constance Haw, MD;  Location: [facility] CV LAB;  Service: Cardiovascular;  Laterality: N/A;    Social History   Socioeconomic History  . Marital status: Married    Spouse name: Not on file  . Number of children: Not on file  . Years of education: Not on file  . Highest education level: Not on file  Occupational History  . Not on file  Social Needs  . Financial resource strain: Not on file  . Food  insecurity:    Worry: Not on file    Inability: Not on file  . Transportation needs:    Medical: Not on file    Non-medical: Not on file  Tobacco Use  . Smoking status: Former Smoker    Packs/day: 0.50    Types: Cigarettes    Start date: 08/09/1956    Last attempt to quit: 08/10/1979    Years since quitting: 39.1  . Smokeless tobacco: Never Used  Substance and Sexual Activity  . Alcohol use: No    Alcohol/week: 0.0 standard drinks  . Drug use: No  . Sexual activity: Not on file  Lifestyle  . Physical activity:    Days per week: Not on file    Minutes per session: Not on file  . Stress: Not on file  Relationships  . Social connections:    Talks on phone: Not on file    Gets together: Not on file    Attends religious service: Not on file    Active member of club or organization: Not on file    Attends meetings of clubs or organizations: Not on file    Relationship status: Not on file  . Intimate partner violence:    Fear of current or ex partner: Not on file    Emotionally abused: Not on file  Physically abused: Not on file    Forced sexual activity: Not on file  Other Topics Concern  . Not on file  Social History Narrative  . Not on file     Vitals:   10/02/18 1529  BP: 134/74  Pulse: 87  SpO2: 97%  Weight: 156 lb (70.8 kg)  Height: 5\' 9"  (1.753 m)    Wt Readings from Last 3 Encounters:  10/02/18 156 lb (70.8 kg)  09/22/18 161 lb (73 kg)  09/07/18 164 lb (74.4 kg)     PHYSICAL EXAM General: NAD HEENT: Normal. Neck: No JVD, no thyromegaly. Lungs: Clear to auscultation bilaterally with normal respiratory effort. CV: Regular rate and rhythm, normal S1/S2, no S3/S4, no murmur. No pretibial or periankle edema.     Abdomen: Soft, nontender, no distention.  Neurologic: Alert and oriented.  Psych: Normal affect. Skin: Normal. Musculoskeletal: No gross deformities.    ECG: Reviewed above under Subjective   Labs: Lab Results  Component Value Date/Time    K 3.5 09/20/2018 05:41 AM   BUN 20 09/20/2018 05:41 AM   CREATININE 1.09 09/20/2018 05:41 AM   ALT 20 09/20/2018 05:41 AM   TSH 1.176 09/20/2018 05:41 AM   HGB 15.5 09/20/2018 05:41 AM     Lipids: No results found for: LDLCALC, LDLDIRECT, CHOL, TRIG, HDL     ASSESSMENT AND PLAN: 1.  AVNRT: Status post radiofrequency ablation by Dr. Curt Bears.  Symptomatically stable.  2.  Hypertension: Controlled on present therapy.  No changes.   Disposition: Follow up with EP as scheduled.  Follow-up with me as needed.   Kate Sable, M.D., F.A.C.C.

## 2018-10-02 NOTE — Patient Instructions (Signed)
Medication Instructions: Your physician recommends that you continue on your current medications as directed. Please refer to the Current Medication list given to you today.   Labwork: None today  Procedures/Testing: None today  Follow-Up: As needed with Dr.Koneswaran  Any Additional Special Instructions Will Be Listed Below (If Applicable).     If you need a refill on your cardiac medications before your next appointment, please call your pharmacy.   

## 2018-10-27 ENCOUNTER — Encounter: Payer: Self-pay | Admitting: Cardiology

## 2018-10-27 ENCOUNTER — Encounter: Payer: Medicare HMO | Admitting: Cardiology

## 2018-10-27 ENCOUNTER — Telehealth: Payer: Self-pay | Admitting: Cardiology

## 2018-10-27 MED ORDER — DILTIAZEM HCL ER COATED BEADS 120 MG PO CP24: 120.0000 mg | ORAL_CAPSULE | Freq: Every day | ORAL | 3 refills | Status: DC

## 2018-10-27 NOTE — Telephone Encounter (Signed)
Follow up:    Patient returning call back. Please call patient back.

## 2018-10-27 NOTE — Progress Notes (Signed)
error    This encounter was created in error - please disregard.

## 2018-10-27 NOTE — Telephone Encounter (Signed)
Phone conversation with Mr. Thibeau today.  He is status post ablation for AVNRT performed 09/21/2018.  He says that for the 2 weeks after his ablation he did well, but has since had more tachycardia.  He at times has some dizziness associated with tachycardia.  He says that it is worse when he lays flat, but when he is active he feels well.  We are unfortunately unable to do ECGs.  He does say that his heart rates have been in the 100s to 120s consistently.  We Krystalyn Kubota thus try him on low-dose diltiazem to see if this Kentaro Alewine help get him back into normal rhythm.  He has had some bradycardia and thus Quierra Silverio try to avoid further medication titration.  Allegra Lai, MD

## 2018-10-27 NOTE — Telephone Encounter (Signed)
This encounter was created in error - please disregard.

## 2018-10-27 NOTE — Telephone Encounter (Addendum)
Reported home VS:  BP 140/90, HR 65, Weight 282 lb  Pt advised to start Diltiazem 120 mg once daily. Pt advised to monitor BP/HR after starting medication.  He will call the office if he experiences any issues after starting medication. Scheduled him for 3 mo f/u on 02/13/19. Patient verbalized understanding and agreeable to plan.

## 2018-10-27 NOTE — Addendum Note (Signed)
Addended by: Stanton Kidney on: 10/27/2018 10:07 AM   Modules accepted: Orders

## 2018-12-03 ENCOUNTER — Encounter: Payer: Self-pay | Admitting: Orthopaedic Surgery

## 2018-12-06 ENCOUNTER — Ambulatory Visit: Payer: Medicare HMO | Admitting: Cardiovascular Disease

## 2019-02-12 ENCOUNTER — Telehealth: Payer: Self-pay | Admitting: Cardiology

## 2019-02-12 NOTE — Telephone Encounter (Signed)
New Message     Unable to leave message, got busy signal  Called to confirm appt and answer COVID Questions

## 2019-02-13 ENCOUNTER — Ambulatory Visit: Payer: Medicare HMO | Admitting: Cardiology

## 2019-04-03 ENCOUNTER — Ambulatory Visit: Payer: Medicare HMO | Admitting: Urology

## 2019-05-14 ENCOUNTER — Telehealth: Payer: Self-pay | Admitting: Cardiology

## 2019-05-14 NOTE — Telephone Encounter (Signed)
Pt reports recent pain in lower legs, "like a heaviness", "like they are real hart to pick up".  States all this started recently over the weekend.  He also reports dizziness when standing, even when changing positions slowly. Denies CP, states it is only a little discomfort that correlates w/ elevated HRs. Denies SOB, weight gain, syncope. Currently HR 114, avg low 100s for past 2 days according to patient.  Typically his HR avg 70-80s. Recent BP 118/79. Pt is not currently taking his Diltiazem d/t low BPs.  Pt aware I will forward this to Dr. Curt Bears for advisement. PRN f/u w/ Bronson Ing. Pt sees urologist tomorrow. Pt has already scheduled appt with Chanetta Marshall on 10/16.

## 2019-05-14 NOTE — Telephone Encounter (Addendum)
° ° °  Patient calling to report dizziness and heaviness in legs, numbness in feet, minor chest discomfort at times, No swelling   STAT if patient feels like he/she is going to faint   1) Are you dizzy now? no  2) Do you feel faint or have you passed out? no  3) Do you have any other symptoms?   4) Have you checked your HR and BP (record if available)? 118/76, 128/85

## 2019-05-15 ENCOUNTER — Ambulatory Visit (INDEPENDENT_AMBULATORY_CARE_PROVIDER_SITE_OTHER): Payer: Medicare HMO | Admitting: Urology

## 2019-05-15 DIAGNOSIS — N401 Enlarged prostate with lower urinary tract symptoms: Secondary | ICD-10-CM | POA: Diagnosis not present

## 2019-05-15 DIAGNOSIS — N5201 Erectile dysfunction due to arterial insufficiency: Secondary | ICD-10-CM

## 2019-05-15 DIAGNOSIS — R3912 Poor urinary stream: Secondary | ICD-10-CM

## 2019-05-15 DIAGNOSIS — R972 Elevated prostate specific antigen [PSA]: Secondary | ICD-10-CM | POA: Diagnosis not present

## 2019-05-16 NOTE — Telephone Encounter (Signed)
Pt reports HR dropping to 53 yesterday. HRs have been more normal since last taking with him. Pt advised to keep track of his HR/BP several times a day to prepare for appt next week.  Advised to also write down if he has low HRs and/or symptoms associated w/ abn HRs. Pt aware I will further discuss this with Dr. Curt Bears to see if he still wants an EKG this week or wait till appt w/ Chanetta Marshall next Friday. Pt aware I will call him back today/tomorrow after re-discussing w/ MD.

## 2019-05-17 NOTE — Telephone Encounter (Signed)
Advised Dr. Curt Bears said to wait and be evaluated at scheduled OV next Friday, he can get an EKG at that appt. Pt advised to contact office if symptoms begin to associate w/ low HRs. Patient verbalized understanding and agreeable to plan.

## 2019-05-24 NOTE — Progress Notes (Signed)
Electrophysiology Office Note Date: 05/25/2019  ID:  Dustin Tucker, DOB May 31, 1932, MRN [id number]  PCP: Dustin Gravel, MD Electrophysiologist: Dustin Tucker  CC: dizziness  Dustin Tucker is a 85 y.o. male seen today for Dr Dustin Tucker.  He presents today for routine electrophysiology followup.  He called in and has been having dizzy spells.  These spells are sometimes orthostatic in nature but also occur with heart rates in the 40's at home. When his heart rates are in the 40's and 50's, he has exercise intolerance and fatigue. He is back in tachycardia today but is largely asymptomatic.   He denies chest pain, palpitations, dyspnea, PND, orthopnea, nausea, vomiting, syncope, edema, weight gain, or early satiety.  Past Medical History:  Diagnosis Date  . Arthritis    hands  . 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 Hypertension    Past Surgical History:  Procedure Laterality Date  . CARPAL TUNNEL RELEASE Right   . COLONOSCOPY N/A 09/19/2013   Procedure: COLONOSCOPY;  Surgeon: Rogene Houston, MD;  [facility];  Service: Endoscopy;  Laterality: N/A;  830  . FINGER AMPUTATION Left    4th and 5th  . left eye blindness Left   . SVT ABLATION N/A 09/21/2018   Procedure: SVT ABLATION;  Surgeon: Constance Haw, MD;  Location: [facility];  Service: Cardiovascular;  Laterality: N/A;    Current Outpatient Medications  Medication Sig Dispense Refill  . aspirin EC 81 MG tablet Take 81 mg by mouth daily.    . cholecalciferol (VITAMIN D) 1000 units tablet Take 1,000 Units by mouth daily.    . cloNIDine (CATAPRES) 0.1 MG tablet Take 0.1 mg by mouth as needed.     . diltiazem (CARDIZEM CD) 120 MG 24 hr capsule Take 1 capsule (120 mg total) by mouth daily. 30 capsule 3  . hydrochlorothiazide (HYDRODIURIL) 25 MG tablet Take 25 mg by mouth daily.     Marland Kitchen lisinopril (PRINIVIL,ZESTRIL) 40 MG tablet Take 40 mg by mouth daily.      No current facility-administered medications for  this visit.     Allergies:   Tamsulosin hcl and Aspirin   Social History: Social History   Socioeconomic History  . Marital status: Married    Spouse name: Not on file  . Number of children: Not on file  . Years of education: Not on file  . Highest education level: Not on file  Occupational History  . Not on file  Social Needs  . Financial resource strain: Not on file  . Food insecurity    Worry: Not on file    Inability: Not on file  . Transportation needs    Medical: Not on file    Non-medical: Not on file  Tobacco Use  . Smoking status: Former Smoker    Packs/day: 0.50    Types: Cigarettes    Start date: 08/09/1956    Quit date: 08/10/1979    Years since quitting: 39.8  . Smokeless tobacco: Never Used  Substance and Sexual Activity  . Alcohol use: No    Alcohol/week: 0.0 standard drinks  . Drug use: No  . Sexual activity: Not on file  Lifestyle  . Physical activity    Days per week: Not on file    Minutes per session: Not on file  . Stress: Not on file  Relationships  . Social connections    Talks on phone: Not on file  Gets together: Not on file    Attends religious service: Not on file    Active member of club or organization: Not on file    Attends meetings of clubs or organizations: Not on file    Relationship status: Not on file  . Intimate partner violence    Fear of current or ex partner: Not on file    Emotionally abused: Not on file    Physically abused: Not on file    Forced sexual activity: Not on file  Other Topics Concern  . Not on file  Social History Narrative  . Not on file    Family History: Family History  Problem Relation Age of Onset  . CAD Mother   . CAD Father   . Cancer Brother     Review of Systems: All other systems reviewed and are otherwise negative except as noted above.   Physical Exam: VS:  BP (!) 148/88   Pulse 63   Ht 5' 9.5" (1.765 m)   Wt 155 lb 12.8 oz (70.7 kg)   SpO2 99%   BMI 22.68 kg/m  , BMI Body  mass index is 22.68 kg/m. Wt Readings from Last 3 Encounters:  05/25/19 155 lb 12.8 oz (70.7 kg)  10/02/18 156 lb (70.8 kg)  09/22/18 161 lb (73 kg)    GEN- The patient is well appearing, alert and oriented x 3 today.   HEENT: normocephalic, atraumatic; sclera clear, conjunctiva pink; hearing intact; oropharynx clear; neck supple  Lungs- Clear to ausculation bilaterally, normal work of breathing.  No wheezes, rales, rhonchi Heart- Tachycardic regular rate and rhythm  GI- soft, non-tender, non-distended, bowel sounds present, no hepatosplenomegaly Extremities- no clubbing, cyanosis, or edema; DP/PT/radial pulses 2+ bilaterally MS- no significant deformity or atrophy Skin- warm and dry, no rash or lesion  Psych- euthymic mood, full affect Neuro- strength and sensation are intact   EKG:  EKG is ordered today. The ekg ordered today shows short RP tachycardia, rate 109  Recent Labs: 09/20/2018: ALT 20; B Natriuretic Peptide 390.0; BUN 20; Creatinine, Ser 1.09; Hemoglobin 15.5; Platelets 155; Potassium 3.5; Sodium 139; TSH 1.176    Other studies Reviewed: Additional studies/ records that were reviewed today include: Dr Dustin Tucker' office notes  Assessment and Plan:  1.  Short RP tachycardia Similar to EKG prior to ablation He is largely asymptomatic today  2.  Bradycardia/dizziness Documented on home monitor. 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 Risks, benefits reviewed with patient today.  He is clear in his decision to decline to proceed today. We discussed options. Will plan 2 week monitor to document more fully heart rates and correlate with symptoms.  Follow up with Dr Dustin Tucker after that ER precautions given   Current medicines are reviewed at length with the patient today.   The patient does not have concerns regarding his medicines.  The following changes were made today:  none  Labs/ tests ordered today include: 2 week holter  No orders of  the defined types were placed in this encounter.    Disposition:   Follow up with Dr Dustin Tucker 3 weeks    Signed, Chanetta Marshall, NP 05/25/2019 10:31 AM   [place] [address] [phone] (office) [phone] (fax)

## 2019-05-25 ENCOUNTER — Telehealth: Payer: Self-pay

## 2019-05-25 ENCOUNTER — Other Ambulatory Visit: Payer: Self-pay

## 2019-05-25 ENCOUNTER — Encounter: Payer: Self-pay | Admitting: Nurse Practitioner

## 2019-05-25 ENCOUNTER — Ambulatory Visit: Payer: Medicare HMO | Admitting: Nurse Practitioner

## 2019-05-25 VITALS — BP 148/88 | HR 63 | Ht 69.5 in | Wt 155.8 lb

## 2019-05-25 DIAGNOSIS — I495 Sick sinus syndrome: Secondary | ICD-10-CM

## 2019-05-25 DIAGNOSIS — R000 Tachycardia, unspecified: Secondary | ICD-10-CM | POA: Diagnosis not present

## 2019-05-25 DIAGNOSIS — I4891 Unspecified atrial fibrillation: Secondary | ICD-10-CM | POA: Diagnosis not present

## 2019-05-25 NOTE — Patient Instructions (Signed)
Medication Instructions:  NONE *If you need a refill on your cardiac medications before your next appointment, please call your pharmacy*  Lab Work: NONE If you have labs (blood work) drawn today and your tests are completely normal, you will receive your results only by: Marland Kitchen MyChart Message (if you have MyChart) OR . A paper copy in the mail If you have any lab test that is abnormal or we need to change your treatment, we will call you to review the results.  Testing/Procedures: ZIO Monitor 2 weeks (will mail to you)  Follow-Up: 3 WEEKS WITH Dr Dustin Tucker At [facility], you and your health needs are our priority.  As part of our continuing mission to provide you with exceptional heart care, we have created designated Provider Care Teams.  These Care Teams include your primary Cardiologist (physician) and Advanced Practice Providers (APPs -  Physician Assistants and Nurse Practitioners) who all work together to provide you with the care you need, when you need it.

## 2019-05-25 NOTE — Telephone Encounter (Signed)
Tried to call pt several times to go over monitor instructions. Line was busy. 14 day ZIO monitor was ordered to be mailed to pt's home address.

## 2019-06-01 ENCOUNTER — Ambulatory Visit (INDEPENDENT_AMBULATORY_CARE_PROVIDER_SITE_OTHER): Payer: Medicare HMO

## 2019-06-01 DIAGNOSIS — I4891 Unspecified atrial fibrillation: Secondary | ICD-10-CM | POA: Diagnosis not present

## 2019-06-21 ENCOUNTER — Ambulatory Visit: Payer: Medicare HMO | Admitting: Cardiology

## 2019-07-12 ENCOUNTER — Other Ambulatory Visit: Payer: Self-pay

## 2019-07-12 ENCOUNTER — Ambulatory Visit: Payer: Medicare HMO | Admitting: Cardiology

## 2019-07-12 ENCOUNTER — Ambulatory Visit (INDEPENDENT_AMBULATORY_CARE_PROVIDER_SITE_OTHER): Payer: Medicare HMO | Admitting: Nurse Practitioner

## 2019-07-12 NOTE — Progress Notes (Deleted)
Electrophysiology Office Note   Date:  07/12/2019   ID:  BREXTON Dustin Tucker, DOB Sep 03, 1931, MRN [id number]  PCP:  Jani Gravel, MD  Cardiologist:  *** Primary Electrophysiologist: *** Lynetta Tomczak Meredith Leeds, MD    Chief Complaint: ***   History of Present Illness: Dustin Tucker is a 85 y.o. male who is being seen today for the evaluation of *** at the request of Jani Gravel, MD. Presenting today for electrophysiology evaluation.    Today, he denies*** symptoms of palpitations, chest pain, shortness of breath, orthopnea, PND, lower extremity edema, claudication, dizziness, presyncope, syncope, bleeding, or neurologic sequela. The patient is tolerating medications without difficulties.    Past Medical History:  Diagnosis Date  . Arthritis    hands  . 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 Hypertension    Past Surgical History:  Procedure Laterality Date  . CARPAL TUNNEL RELEASE Right   . COLONOSCOPY N/A 09/19/2013   Procedure: COLONOSCOPY;  Surgeon: Rogene Houston, MD;  [facility];  Service: Endoscopy;  Laterality: N/A;  830  . FINGER AMPUTATION Left    4th and 5th  . left eye blindness Left   . SVT ABLATION N/A 09/21/2018   Procedure: SVT ABLATION;  Surgeon: Constance Haw, MD;  Location: [facility];  Service: Cardiovascular;  Laterality: N/A;     Current Outpatient Medications  Medication Sig Dispense Refill  . aspirin EC 81 MG tablet Take 81 mg by mouth daily.    . cholecalciferol (VITAMIN D) 1000 units tablet Take 1,000 Units by mouth daily.    . cloNIDine (CATAPRES) 0.1 MG tablet Take 0.1 mg by mouth as needed.     . diltiazem (CARDIZEM CD) 120 MG 24 hr capsule Take 1 capsule (120 mg total) by mouth daily. 30 capsule 3  . hydrochlorothiazide (HYDRODIURIL) 25 MG tablet Take 25 mg by mouth daily.     Marland Kitchen lisinopril (PRINIVIL,ZESTRIL) 40 MG tablet Take 40 mg by mouth daily.      No current facility-administered medications for this visit.      Allergies:   Tamsulosin hcl and Aspirin   Social History:  The patient  reports that he quit smoking about 39 years ago. His smoking use included cigarettes. He started smoking about 62 years ago. He smoked 0.50 packs per day. He has never used smokeless tobacco. He reports that he does not drink alcohol or use drugs.   Family History:  The patient's ***family history includes CAD in his father and mother; Cancer in his brother.    ROS:  Please see the history of present illness.   Otherwise, review of systems is positive for ***.   All other systems are reviewed and negative.    PHYSICAL EXAM: VS:  There were no vitals taken for this visit. , BMI There is no height or weight on file to calculate BMI. GEN: Well nourished, well developed, in no acute distress  HEENT: normal  Neck: no JVD, carotid bruits, or masses Cardiac: ***RRR; no murmurs, rubs, or gallops,no edema  Respiratory:  clear to auscultation bilaterally, normal work of breathing GI: soft, nontender, nondistended, + BS MS: no deformity or atrophy  Skin: warm and dry, ***device pocket is well healed Neuro:  Strength and sensation are intact Psych: euthymic mood, full affect  EKG:  EKG {ACTION; IS/IS [id number] ordered today. Personal review of the ekg ordered *** shows ***  ***Device interrogation is reviewed today in detail.  See PaceArt for details.   Recent Labs: 09/20/2018: ALT 20; B Natriuretic Peptide 390.0; BUN 20; Creatinine, Ser 1.09; Hemoglobin 15.5; Platelets 155; Potassium 3.5; Sodium 139; TSH 1.176    Lipid Panel  No results found for: CHOL, TRIG, HDL, CHOLHDL, VLDL, LDLCALC, LDLDIRECT   Wt Readings from Last 3 Encounters:  05/25/19 155 lb 12.8 oz (70.7 kg)  10/02/18 156 lb (70.8 kg)  09/22/18 161 lb (73 kg)      Other studies Reviewed: Additional studies/ records that were reviewed today include: ***  Review of the above records today demonstrates: ***   ASSESSMENT AND PLAN:  1.  ***     Current medicines are reviewed at length with the patient today.   The patient {ACTIONS; HAS/DOES NOT HAVE:19233} concerns regarding his medicines.  The following changes were made today:  {NONE DEFAULTED:18576::"none"}  Labs/ tests ordered today include: *** No orders of the defined types were placed in this encounter.    Disposition:   FU with Chauntel Windsor {gen number [id number] {Days to years:10300}  Signed, Roisin Mones Meredith Leeds, MD  07/12/2019 2:15 PM     [facility] [address] [place] [address] [phone] (office) [phone] (fax).l

## 2019-07-12 NOTE — Progress Notes (Deleted)
   Subjective:    Patient ID: Dustin Tucker, male    DOB: February 02, 1932, 85 y.o.   MRN: [id number]  HPI patient is an 85 year old male with a past medical history significant for hypertension, SVT, 1st degree AV block, arthritis   Past Medical History:  Diagnosis Date  . Arthritis    hands  . 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 Hypertension    Past Surgical History:  Procedure Laterality Date  . CARPAL TUNNEL RELEASE Right   . COLONOSCOPY N/A 09/19/2013   Procedure: COLONOSCOPY;  Surgeon: Rogene Houston, MD;  [facility];  Service: Endoscopy;  Laterality: N/A;  830  . FINGER AMPUTATION Left    4th and 5th  . left eye blindness Left   . SVT ABLATION N/A 09/21/2018   Procedure: SVT ABLATION;  Surgeon: Constance Haw, MD;  Location: [facility];  Service: Cardiovascular;  Laterality: N/A;        Review of Systems     Objective:   Physical Exam        Assessment & Plan:

## 2019-07-31 ENCOUNTER — Telehealth: Payer: Self-pay | Admitting: *Deleted

## 2019-07-31 MED ORDER — DILTIAZEM HCL ER COATED BEADS 180 MG PO CP24: 180.0000 mg | ORAL_CAPSULE | Freq: Every day | ORAL | 3 refills | Status: DC

## 2019-07-31 NOTE — Telephone Encounter (Signed)
Informed patient of results and verbal understanding expressed. Pt scheduled for OV next month to discuss monitor recommendations further. Patient verbalized understanding and agreeable to plan.

## 2019-07-31 NOTE — Telephone Encounter (Signed)
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 Can re-ablate vs increase diltiazem to 180 mg.

## 2019-08-24 ENCOUNTER — Other Ambulatory Visit: Payer: Self-pay

## 2019-08-24 ENCOUNTER — Ambulatory Visit: Payer: Medicare HMO | Admitting: Cardiology

## 2019-08-24 ENCOUNTER — Encounter: Payer: Self-pay | Admitting: Cardiology

## 2019-08-24 VITALS — BP 116/84 | HR 98 | Ht 69.0 in | Wt 164.8 lb

## 2019-08-24 DIAGNOSIS — I471 Supraventricular tachycardia: Secondary | ICD-10-CM

## 2019-08-24 NOTE — Patient Instructions (Signed)
Medication Instructions:  Your physician recommends that you continue on your current medications as directed. Please refer to the Current Medication list given to you today.  * If you need a refill on your cardiac medications before your next appointment, please call your pharmacy.   Labwork: None ordered If you have labs (blood work) drawn today and your tests are completely normal, you will receive your results only by:  [place] (if you have MyChart) OR  A paper copy in the mail If you have any lab test that is abnormal or we need to change your treatment, we will call you to review the results.  Testing/Procedures: None ordered  Follow-Up: At [facility], you and your health needs are our priority.  As part of our continuing mission to provide you with exceptional heart care, we have created designated Provider Care Teams.  These Care Teams include your primary Cardiologist (physician) and Advanced Practice Providers (APPs -  Physician Assistants and Nurse Practitioners) who all work together to provide you with the care you need, when you need it.  You will need a follow up appointment in 6 months.  Please call our office 2 months in advance to schedule this appointment.  You may see Dr Curt Bears or one of the following Advanced Practice Providers on your designated Care Team:    Chanetta Marshall, NP  Tommye Standard, PA-C  Oda Kilts, [place]  Thank you for choosing [facility]!!   Trinidad Curet, RN [phone]  Any Other Special Instructions Will Be Listed Below (If Applicable).   Pacemaker Implantation, Adult Pacemaker implantation is a procedure to place a pacemaker inside your chest. A pacemaker is a small computer that sends electrical signals to the heart and helps your heart beat normally. A pacemaker also stores information about your heart rhythms. You may need pacemaker implantation if you:  Have a slow heartbeat (bradycardia).  Faint (syncope).  Have  shortness of breath (dyspnea) due to heart problems. The pacemaker attaches to your heart through a wire, called a lead. Sometimes just one lead is needed. Other times, there will be two leads. There are two types of pacemakers:  Transvenous pacemaker. This type is placed under the skin or muscle of your chest. The lead goes through a vein in the chest area to reach the inside of the heart.  Epicardial pacemaker. This type is placed under the skin or muscle of your chest or belly. The lead goes through your chest to the outside of the heart. Tell a health care provider about:  Any allergies you have.  All medicines you are taking, including vitamins, herbs, eye drops, creams, and over-the-counter medicines.  Any problems you or family members have had with anesthetic medicines.  Any blood or bone disorders you have.  Any surgeries you have had.  Any medical conditions you have.  Whether you are pregnant or may be pregnant. What are the risks? Generally, this is a safe procedure. However, problems may occur, including:  Infection.  Bleeding.  Failure of the pacemaker or the lead.  Collapse of a lung or bleeding into a lung.  Blood clot inside a blood vessel with a lead.  Damage to the heart.  Infection inside the heart (endocarditis).  Allergic reactions to medicines. What happens before the procedure? Staying hydrated Follow instructions from your health care provider about hydration, which may include:  Up to 2 hours before the procedure - you may continue to drink clear liquids, such as water, clear  fruit juice, black coffee, and plain tea. Eating and drinking restrictions Follow instructions from your health care provider about eating and drinking, which may include:  8 hours before the procedure - stop eating heavy meals or foods such as meat, fried foods, or fatty foods.  6 hours before the procedure - stop eating light meals or foods, such as toast or  cereal.  6 hours before the procedure - stop drinking milk or drinks that contain milk.  2 hours before the procedure - stop drinking clear liquids. Medicines  Ask your health care provider about: ? Changing or stopping your regular medicines. This is especially important if you are taking diabetes medicines or blood thinners. ? Taking medicines such as aspirin and ibuprofen. These medicines can thin your blood. Do not take these medicines before your procedure if your health care provider instructs you not to.  You may be given antibiotic medicine to help prevent infection. General instructions  You will have a heart evaluation. This may include an electrocardiogram (ECG), chest X-ray, and heart imaging (echocardiogram,  or echo) tests.  You will have blood tests.  Do not use any products that contain nicotine or tobacco, such as cigarettes and e-cigarettes. If you need help quitting, ask your health care provider.  Plan to have someone take you home from the hospital or clinic.  If you will be going home right after the procedure, plan to have someone with you for 24 hours.  Ask your health care provider how your surgical site will be marked or identified. What happens during the procedure?  To reduce your risk of infection: ? Your health care team will wash or sanitize their hands. ? Your skin will be washed with soap. ? Hair may be removed from the surgical area.  An IV tube will be inserted into one of your veins.  You will be given one or more of the following: ? A medicine to help you relax (sedative). ? A medicine to numb the area (local anesthetic). ? A medicine to make you fall asleep (general anesthetic).  If you are getting a transvenous pacemaker: ? An incision will be made in your upper chest. ? A pocket will be made for the pacemaker. It may be placed under the skin or between layers of muscle. ? The lead will be inserted into a blood vessel that returns to  the heart. ? While X-rays are taken by an imaging machine (fluoroscopy), the lead will be advanced through the vein to the inside of your heart. ? The other end of the lead will be tunneled under the skin and attached to the pacemaker.  If you are getting an epicardial pacemaker: ? An incision will be made near your ribs or breastbone (sternum) for the lead. ? The lead will be attached to the outside of your heart. ? Another incision will be made in your chest or upper belly to create a pocket for the pacemaker. ? The free end of the lead will be tunneled under the skin and attached to the pacemaker.  The transvenous or epicardial pacemaker will be tested. Imaging studies may be done to check the lead position.  The incisions will be closed with stitches (sutures), adhesive strips, or skin glue.  Bandages (dressing) will be placed over the incisions. The procedure may vary among health care providers and hospitals. What happens after the procedure?  Your blood pressure, heart rate, breathing rate, and blood oxygen level will be monitored until the medicines  you were given have worn off.  You will be given antibiotics and pain medicine.  ECG and chest x-rays will be done.  You will wear a continuous type of ECG (Holter monitor) to check your heart rhythm.  Your health care provider will program the pacemaker.  Do not drive for 24 hours if you received a sedative. This information is not intended to replace advice given to you by your health care provider. Make sure you discuss any questions you have with your health care provider. Document Revised: 04/14/2018 Document Reviewed: 01/07/2016 Elsevier Patient Education  [place].

## 2019-08-24 NOTE — Progress Notes (Signed)
Electrophysiology Office Note   Date:  08/24/2019   ID:  Dustin Tucker, DOB 1932/05/05, MRN [id number]  PCP:  Jani Gravel, MD  Cardiologist:  Bronson Ing Primary Electrophysiologist:  Keelan Pomerleau Meredith Leeds, MD    Chief Complaint: SVT   History of Present Illness: Dustin Tucker is a 86 y.o. male who is being seen today for the evaluation of SVT at the request of Jani Gravel, MD. Presenting today for electrophysiology evaluation.  He has a history of SVT, hypertension, bradycardia.  He has dizzy spells at times.  These occur when his heart rates are in the 40s to 50s.  He does have SVT and is now status post ablation 09/21/2018, though he has had recurrent SVT.  Today, he denies symptoms of palpitations, chest pain, shortness of breath, orthopnea, PND, lower extremity edema, claudication, dizziness, presyncope, syncope, bleeding, or neurologic sequela. The patient is tolerating medications without difficulties.  He has not had any syncope, but he does get weak and fatigued at times.  He says that the times that he gets weak and fatigued occur when his heart rates are in the 40s to 50s.  This occurs once or twice a day and lasts up to 40 minutes.  Otherwise he is doing well without major complaint.   Past Medical History:  Diagnosis Date  . Arthritis    hands  . 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 Hypertension    Past Surgical History:  Procedure Laterality Date  . CARPAL TUNNEL RELEASE Right   . COLONOSCOPY N/A 09/19/2013   Procedure: COLONOSCOPY;  Surgeon: Rogene Houston, MD;  [facility];  Service: Endoscopy;  Laterality: N/A;  830  . FINGER AMPUTATION Left    4th and 5th  . left eye blindness Left   . SVT ABLATION N/A 09/21/2018   Procedure: SVT ABLATION;  Surgeon: Constance Haw, MD;  Location: [facility];  Service: Cardiovascular;  Laterality: N/A;     Current Outpatient Medications  Medication Sig Dispense Refill  . aspirin EC 81 MG tablet  Take 81 mg by mouth daily.    . cholecalciferol (VITAMIN D) 1000 units tablet Take 1,000 Units by mouth daily.    . cloNIDine (CATAPRES) 0.1 MG tablet Take 0.1 mg by mouth as needed.     . diltiazem (CARDIZEM CD) 180 MG 24 hr capsule Take 1 capsule (180 mg total) by mouth daily. 30 capsule 3  . hydrochlorothiazide (HYDRODIURIL) 25 MG tablet Take 25 mg by mouth daily.     Marland Kitchen lisinopril (PRINIVIL,ZESTRIL) 40 MG tablet Take 40 mg by mouth daily.      No current facility-administered medications for this visit.    Allergies:   Tamsulosin hcl and Aspirin   Social History:  The patient  reports that he quit smoking about 40 years ago. His smoking use included cigarettes. He started smoking about 63 years ago. He smoked 0.50 packs per day. He has never used smokeless tobacco. He reports that he does not drink alcohol or use drugs.   Family History:  The patient's family history includes CAD in his father and mother; Cancer in his brother.    ROS:  Please see the history of present illness.   Otherwise, review of systems is positive for none.   All other systems are reviewed and negative.    PHYSICAL EXAM: VS:  BP 116/84   Pulse 98   Ht 5\' 9"  (1.753 m)  Wt 164 lb 12.8 oz (74.8 kg)   SpO2 98%   BMI 24.34 kg/m  , BMI Body mass index is 24.34 kg/m. GEN: Well nourished, well developed, in no acute distress  HEENT: normal  Neck: no JVD, carotid bruits, or masses Cardiac: RRR; no murmurs, rubs, or gallops,no edema  Respiratory:  clear to auscultation bilaterally, normal work of breathing GI: soft, nontender, nondistended, + BS MS: no deformity or atrophy  Skin: warm and dry Neuro:  Strength and sensation are intact Psych: euthymic mood, full affect  EKG:  EKG is not ordered today. Personal review of the ekg ordered 05/25/2019 shows SVT, rate 109  Recent Labs: 09/20/2018: ALT 20; B Natriuretic Peptide 390.0; BUN 20; Creatinine, Ser 1.09; Hemoglobin 15.5; Platelets 155; Potassium 3.5;  Sodium 139; TSH 1.176    Lipid Panel  No results found for: CHOL, TRIG, HDL, CHOLHDL, VLDL, LDLCALC, LDLDIRECT   Wt Readings from Last 3 Encounters:  08/24/19 164 lb 12.8 oz (74.8 kg)  05/25/19 155 lb 12.8 oz (70.7 kg)  10/02/18 156 lb (70.8 kg)      Other studies Reviewed: Additional studies/ records that were reviewed today include: Monitor 07/24/19 personally reviewed  Review of the above records today demonstrates:  Max 250 bpm 01:12pm, 11/05 Min 29 bpm 06:22am, 11/02 Avg 94 bpm 1.2% PVCs Less than 1% PACs Multiple episodes of SVT 23 ventricular runs fastest interval 4 beats at 250 bpm 14 pauses longest 3.5 seconds appear to be due to postconversion Intermittent episodes of junctional rhythm with accelerated junctional rhythm versus slow AVNRT   ASSESSMENT AND PLAN:  1.  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  He is minimally symptomatic.  We Terra Aveni continue his current dose of diltiazem.  2.  Bradycardia/dizziness: Has had pauses and bradycardia, though these appear to be mostly occurring at night.  He does state that his heart rate gets down into the 40s and he feels weak and fatigued.  I told him that a pacemaker would be likely the best option for him.  He would like to consult with his son and Tranell Wojtkiewicz call us back with a decision.    Current medicines are reviewed at length with the patient today.   The patient does not have concerns regarding his medicines.  The following changes were made today:  none  Labs/ tests ordered today include:  No orders of the defined types were placed in this encounter.    Disposition:   FU with Sofiya Ezelle 6 months  Signed, Janeece Blok Meredith Leeds, MD  08/24/2019 8:49 AM     [facility] [address] [place] [address] [phone] (office) [phone] (fax)

## 2019-10-09 ENCOUNTER — Ambulatory Visit: Payer: Medicare HMO | Attending: Internal Medicine

## 2019-10-09 DIAGNOSIS — Z23 Encounter for immunization: Secondary | ICD-10-CM | POA: Insufficient documentation

## 2019-10-09 NOTE — Progress Notes (Signed)
   Covid-19 Vaccination Clinic  Name:  Dustin Tucker    MRN: [id number] DOB: [birth year]  10/09/2019  [name] was observed post Covid-19 immunization for 15 minutes without incident. He was provided with Vaccine Information Sheet and instruction to access the V-Safe system.   [name] was instructed to call 911 with any severe reactions post vaccine: Marland Kitchen Difficulty breathing  . Swelling of face and throat  . A fast heartbeat  . A bad rash all over body  . Dizziness and weakness   Immunizations Administered    Name Date Dose VIS Date Route   Moderna COVID-19 Vaccine 10/09/2019 11:28 AM 0.5 mL 07/10/2019 Intramuscular   Manufacturer: Moderna   Lot: [id number]   [id number]

## 2019-11-06 ENCOUNTER — Ambulatory Visit: Payer: Medicare HMO | Attending: Internal Medicine

## 2019-11-06 DIAGNOSIS — Z23 Encounter for immunization: Secondary | ICD-10-CM

## 2019-11-06 NOTE — Progress Notes (Signed)
   Covid-19 Vaccination Clinic  Name:  Dustin Tucker    MRN: [id number] DOB: 23-May-1932  11/06/2019  [name] was observed post Covid-19 immunization for 15 minutes without incident. He was provided with Vaccine Information Sheet and instruction to access the V-Safe system.   [name] was instructed to call 911 with any severe reactions post vaccine: Marland Kitchen Difficulty breathing  . Swelling of face and throat  . A fast heartbeat  . A bad rash all over body  . Dizziness and weakness   Immunizations Administered    Name Date Dose VIS Date Route   Moderna COVID-19 Vaccine 11/06/2019 10:26 AM 0.5 mL 07/10/2019 Intramuscular   Manufacturer: Moderna   Lot: [id number]   [id number]

## 2019-12-28 ENCOUNTER — Other Ambulatory Visit: Payer: Self-pay | Admitting: Cardiology

## 2020-04-22 ENCOUNTER — Ambulatory Visit: Payer: Medicare HMO | Admitting: Cardiology

## 2020-04-22 ENCOUNTER — Other Ambulatory Visit: Payer: Self-pay

## 2020-04-22 ENCOUNTER — Encounter: Payer: Self-pay | Admitting: Cardiology

## 2020-04-22 VITALS — BP 144/66 | HR 75 | Ht 69.0 in | Wt 171.0 lb

## 2020-04-22 DIAGNOSIS — I471 Supraventricular tachycardia: Secondary | ICD-10-CM

## 2020-04-22 NOTE — Progress Notes (Signed)
Electrophysiology Office Note   Date:  04/22/2020   ID:  Dustin Tucker, DOB [birth year], MRN [id number]  PCP:  Jani Gravel, MD  Cardiologist:  Bronson Ing Primary Electrophysiologist:  Delfino Friesen Meredith Leeds, MD    Chief Complaint: SVT   History of Present Illness: Dustin Tucker is a 86 y.o. male who is being seen today for the evaluation of SVT at the request of Jani Gravel, MD. Presenting today for electrophysiology evaluation.  He has a history of SVT, hypertension, bradycardia.  He has dizzy spells at times.  These occur when his heart rates are in the 40s to 50s.  He does have SVT and is now status post ablation 09/21/2018, though he has had recurrent SVT.  Today, denies symptoms of palpitations, chest pain, shortness of breath, orthopnea, PND, lower extremity edema, claudication, dizziness, presyncope, syncope, bleeding, or neurologic sequela. The patient is tolerating medications without difficulties.  Since last being seen he has done well.  He has no chest pain or shortness of breath.  He was previously having quite a bit of fatigue and weakness, but this has gone away.  He has no acute complaints today.   Past Medical History:  Diagnosis Date  . Arthritis    hands  . 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 Hypertension    Past Surgical History:  Procedure Laterality Date  . CARPAL TUNNEL RELEASE Right   . COLONOSCOPY N/A 09/19/2013   Procedure: COLONOSCOPY;  Surgeon: Rogene Houston, MD;  [facility];  Service: Endoscopy;  Laterality: N/A;  830  . FINGER AMPUTATION Left    4th and 5th  . left eye blindness Left   . SVT ABLATION N/A 09/21/2018   Procedure: SVT ABLATION;  Surgeon: Constance Haw, MD;  Location: [facility];  Service: Cardiovascular;  Laterality: N/A;     Current Outpatient Medications  Medication Sig Dispense Refill  . aspirin EC 81 MG tablet Take 81 mg by mouth daily.    . cholecalciferol (VITAMIN D) 1000 units tablet Take  1,000 Units by mouth daily.    . cloNIDine (CATAPRES) 0.1 MG tablet Take 0.1 mg by mouth as needed.     . diltiazem (CARDIZEM CD) 180 MG 24 hr capsule TAKE (1) CAPSULE BY MOUTH ONCE DAILY. 90 capsule 3  . hydrochlorothiazide (HYDRODIURIL) 25 MG tablet Take 25 mg by mouth daily.     Marland Kitchen lisinopril (PRINIVIL,ZESTRIL) 40 MG tablet Take 40 mg by mouth daily.      No current facility-administered medications for this visit.    Allergies:   Tamsulosin hcl and Aspirin   Social History:  The patient  reports that he quit smoking about 40 years ago. His smoking use included cigarettes. He started smoking about 63 years ago. He smoked 0.50 packs per day. He has never used smokeless tobacco. He reports that he does not drink alcohol and does not use drugs.   Family History:  The patient's family history includes CAD in his father and mother; Cancer in his brother.    ROS:  Please see the history of present illness.   Otherwise, review of systems is positive for none.   All other systems are reviewed and negative.   PHYSICAL EXAM: VS:  BP (!) 144/66   Pulse 75   Ht 5\' 9"  (1.753 m)   Wt 171 lb (77.6 kg)   SpO2 99%   BMI 25.25 kg/m  , BMI Body mass index  is 25.25 kg/m. GEN: Well nourished, well developed, in no acute distress  HEENT: normal  Neck: no JVD, carotid bruits, or masses Cardiac: RRR; no murmurs, rubs, or gallops,no edema  Respiratory:  clear to auscultation bilaterally, normal work of breathing GI: soft, nontender, nondistended, + BS MS: no deformity or atrophy  Skin: warm and dry Neuro:  Strength and sensation are intact Psych: euthymic mood, full affect  EKG:  EKG is ordered today. Personal review of the ekg ordered shows sinus rhythm, rate 75 today  Recent Labs: No results found for requested labs within last 8760 hours.    Lipid Panel  No results found for: CHOL, TRIG, HDL, CHOLHDL, VLDL, LDLCALC, LDLDIRECT   Wt Readings from Last 3 Encounters:  04/22/20 171 lb (77.6  kg)  08/24/19 164 lb 12.8 oz (74.8 kg)  05/25/19 155 lb 12.8 oz (70.7 kg)      Other studies Reviewed: Additional studies/ records that were reviewed today include: Monitor 07/24/19 personally reviewed  Review of the above records today demonstrates:  Max 250 bpm 01:12pm, 11/05 Min 29 bpm 06:22am, 11/02 Avg 94 bpm 1.2% PVCs Less than 1% PACs Multiple episodes of SVT 23 ventricular runs fastest interval 4 beats at 250 bpm 14 pauses longest 3.5 seconds appear to be due to postconversion Intermittent episodes of junctional rhythm with accelerated junctional rhythm versus slow AVNRT   ASSESSMENT AND PLAN:  1.  Short RP tachycardia: Has been asymptomatic in the past.  Continue current dose of diltiazem.  Currently feeling well without further episodes.  No changes.  2.  Bradycardia/dizziness: Has had pauses with bradycardia mostly nocturnal.  His heart rate does go down into the 40s.  We have spoken about pacemaker implant.  At this point, he continues to feel well without symptoms of fatigue.  We Dustin Tucker continue with his current dose of diltiazem and avoid pacemaker implant.   Current medicines are reviewed at length with the patient today.   The patient does not have concerns regarding his medicines.  The following changes were made today: None  Labs/ tests ordered today include:  Orders Placed This Encounter  Procedures  . EKG 12-Lead     Disposition:   FU with Dustin Tucker 12 months  Signed, Patsey Pitstick Meredith Leeds, MD  04/22/2020 9:42 AM     [facility] [address] [place] [address] [phone] (office) [phone] (fax)

## 2020-10-21 ENCOUNTER — Other Ambulatory Visit: Payer: Self-pay

## 2020-10-21 ENCOUNTER — Encounter: Payer: Self-pay | Admitting: Urology

## 2020-10-21 ENCOUNTER — Ambulatory Visit (INDEPENDENT_AMBULATORY_CARE_PROVIDER_SITE_OTHER): Payer: Medicare HMO | Admitting: Urology

## 2020-10-21 VITALS — BP 165/72 | HR 78 | Temp 98.0°F | Ht 69.0 in | Wt 162.0 lb

## 2020-10-21 DIAGNOSIS — R972 Elevated prostate specific antigen [PSA]: Secondary | ICD-10-CM | POA: Diagnosis not present

## 2020-10-21 LAB — URINALYSIS, ROUTINE W REFLEX MICROSCOPIC
Bilirubin, UA: NEGATIVE
Glucose, UA: NEGATIVE
Ketones, UA: NEGATIVE
Leukocytes,UA: NEGATIVE
Nitrite, UA: NEGATIVE
Protein,UA: NEGATIVE
RBC, UA: NEGATIVE
Specific Gravity, UA: 1.025 (ref 1.005–1.030)
Urobilinogen, Ur: 0.2 mg/dL (ref 0.2–1.0)
pH, UA: 6 (ref 5.0–7.5)

## 2020-10-21 NOTE — Progress Notes (Signed)
Urological Symptom Review  Patient is experiencing the following symptoms: Get up at night to urinate Stream starts and stops   Review of Systems  Gastrointestinal (upper)  : Negative for upper GI symptoms  Gastrointestinal (lower) : Negative for lower GI symptoms  Constitutional : Negative for symptoms  Skin: Negative for skin symptoms  Eyes: Negative for eye symptoms  Ear/Nose/Throat : Negative for Ear/Nose/Throat symptoms  Hematologic/Lymphatic: Negative for Hematologic/Lymphatic symptoms  Cardiovascular : Negative for cardiovascular symptoms  Respiratory : Negative for respiratory symptoms  Endocrine: Negative for endocrine symptoms  Musculoskeletal: Back pain Joint pain  Neurological: Dizziness  Psychologic: Negative for psychiatric symptoms

## 2020-10-21 NOTE — Progress Notes (Signed)
History of Present Illness: This 87 year old is here today for follow-up of elevated PSA and BPH.  He underwent ultrasound biopsy of his prostate in January 2016.  At that time PSA was 8.74 with percent free 16.  Prostatic volume was 61 mL, PSA density 0.14.  There was 1 core at the right base which revealed atypia.  All of the cores were benign.  He was last seen in October 2020.  At that time PSA was 13.3.  Since that time, he has had minimal urinary issues.  IPSS is 15, quality-of-life score 1.  He is not on any BPH medications.  IPSS Questionnaire (AUA-7): Over the past month.   1)  How often have you had a sensation of not emptying your bladder completely after you finish urinating?  2 - Less than half the time  2)  How often have you had to urinate again less than two hours after you finished urinating? 3 - About half the time  3)  How often have you found you stopped and started again several times when you urinated?  2 - Less than half the time  4) How difficult have you found it to postpone urination?  3 - About half the time  5) How often have you had a weak urinary stream?  3 - About half the time  6) How often have you had to push or strain to begin urination?  2 - Less than half the time  7) How many times did you most typically get up to urinate from the time you went to bed until the time you got up in the morning?  2 - 2 times  Total score:  0-7 mildly symptomatic   8-19 moderately symptomatic   20-35 severely symptomatic     Past Medical History:  Diagnosis Date  . Arthritis    hands  . 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 Hypertension     Past Surgical History:  Procedure Laterality Date  . CARPAL TUNNEL RELEASE Right   . COLONOSCOPY N/A 09/19/2013   Procedure: COLONOSCOPY;  Surgeon: Rogene Houston, MD;  [facility];  Service: Endoscopy;  Laterality: N/A;  830  . FINGER AMPUTATION Left    4th and 5th  . left eye blindness Left   . SVT ABLATION  N/A 09/21/2018   Procedure: SVT ABLATION;  Surgeon: Constance Haw, MD;  Location: [place] [facility];  Service: Cardiovascular;  Laterality: N/A;    Home Medications:  Allergies as of 10/21/2020      Reactions   Tamsulosin Hcl Nausea Only   Aspirin Other (See Comments)   Gi upset, can tolerate baby aspirin      Medication List       Accurate as of October 21, 2020  4:12 PM. If you have any questions, ask your nurse or doctor.        aspirin EC 81 MG tablet Take 81 mg by mouth daily.   cholecalciferol 1000 units tablet Commonly known as: VITAMIN D Take 1,000 Units by mouth daily.   cloNIDine 0.1 MG tablet Commonly known as: CATAPRES Take 0.1 mg by mouth as needed.   diltiazem 180 MG 24 hr capsule Commonly known as: CARDIZEM CD TAKE (1) CAPSULE BY MOUTH ONCE DAILY.   hydrochlorothiazide 25 MG tablet Commonly known as: HYDRODIURIL Take 25 mg by mouth daily.   lisinopril 40 MG tablet Commonly known as: ZESTRIL Take 40 mg by mouth daily.  Allergies:  Allergies  Allergen Reactions  . Tamsulosin Hcl Nausea Only  . Aspirin Other (See Comments)    Gi upset, can tolerate baby aspirin    Family History  Problem Relation Age of Onset  . CAD Mother   . CAD Father   . Cancer Brother     Social History:  reports that he quit smoking about 41 years ago. His smoking use included cigarettes. He started smoking about 64 years ago. He smoked 0.50 packs per day. He has never used smokeless tobacco. He reports that he does not drink alcohol and does not use drugs.  ROS: A complete review of systems was performed.  All systems are negative except for pertinent findings as noted.  Physical Exam:  Vital signs in last 24 hours: BP (!) 165/72   Pulse 78   Temp 98 F (36.7 C)   Ht 5\' 9"  (1.753 m)   Wt 162 lb (73.5 kg)   BMI 23.92 kg/m  Constitutional:  Alert and oriented, No acute distress Cardiovascular: Regular rate  Respiratory: Normal respiratory  effort GI: Abdomen is soft, nontender, nondistended, no abdominal masses. Genitourinary: Normal male phallus, testes are descended bilaterally and non-tender and without masses, scrotum is normal in appearance without lesions or masses, perineum is normal on inspection.  Prostate 80 g, right lobe slightly larger than left, no nodularity. Lymphatic: No lymphadenopathy Neurologic: Grossly intact, no focal deficits Psychiatric: Normal mood and affect  I have reviewed prior pt notes  I have reviewed notes from referring/previous physicians  I have reviewed urinalysis results  I have reviewed prior PSA/pathology results    Impression/Assessment:  1.  Elevated PSA.  Last checked about a year and a half ago was 13.3.  Negative biopsy in 2016.  Prostate today feels about the same, 80 g in size with mild enlargement of the right lobe but no nodularity  2.  BPH with tolerable symptomatology Plan:  1.  He will have his PSA checked today  Unless significant change from his baseline in 2020, I will see back in a year

## 2020-10-22 LAB — PSA: Prostate Specific Ag, Serum: 14.8 ng/mL — ABNORMAL HIGH (ref 0.0–4.0)

## 2020-10-23 ENCOUNTER — Other Ambulatory Visit: Payer: Self-pay

## 2020-10-23 ENCOUNTER — Telehealth: Payer: Self-pay

## 2020-10-23 DIAGNOSIS — R972 Elevated prostate specific antigen [PSA]: Secondary | ICD-10-CM

## 2020-10-23 NOTE — Telephone Encounter (Signed)
-----   Message from Franchot Gallo, MD sent at 10/23/2020  6:25 AM EDT ----- Notify pt PSA up some--give #. OK to followup in a year--make sure PSA order in for then ----- Message ----- From: Valentina Lucks, LPN Sent: 04/22/4457   8:21 AM EDT To: Franchot Gallo, MD  Pls review.

## 2020-10-23 NOTE — Telephone Encounter (Signed)
Patient called with no answer. Message left to return call to office. 

## 2020-10-23 NOTE — Progress Notes (Signed)
Psa order placed. Pt scheduled for 1 yr appointment all ready.

## 2020-10-23 NOTE — Telephone Encounter (Signed)
-----   Message from Franchot Gallo, MD sent at 10/23/2020  6:25 AM EDT ----- Notify pt PSA up some--give #. OK to followup in a year--make sure PSA order in for then ----- Message ----- From: Valentina Lucks, LPN Sent: 3/88/7195   8:21 AM EDT To: Franchot Gallo, MD  Pls review.

## 2020-10-23 NOTE — Telephone Encounter (Signed)
Pt called back and notified of increase in PSA.

## 2021-04-27 ENCOUNTER — Ambulatory Visit: Payer: Medicare HMO | Admitting: Cardiology

## 2021-04-27 ENCOUNTER — Encounter: Payer: Self-pay | Admitting: Cardiology

## 2021-04-27 ENCOUNTER — Other Ambulatory Visit: Payer: Self-pay

## 2021-04-27 VITALS — BP 142/84 | HR 78 | Ht 69.0 in | Wt 164.4 lb

## 2021-04-27 DIAGNOSIS — I471 Supraventricular tachycardia: Secondary | ICD-10-CM | POA: Diagnosis not present

## 2021-04-27 NOTE — Progress Notes (Signed)
Electrophysiology Office Note   Date:  04/27/2021   ID:  Dustin Tucker, DOB 12-12-1931, MRN [id number]  PCP:  Jani Gravel, MD  Cardiologist:  Bronson Ing Primary Electrophysiologist:  Leialoha Hanna Meredith Leeds, MD    Chief Complaint: SVT   History of Present Illness: Dustin Tucker is a 87 y.o. male who is being seen today for the evaluation of SVT at the request of Jani Gravel, MD. Presenting today for electrophysiology evaluation.  He has a history of SVT, hypertension, and bradycardia.  He has dizzy spells at times.  These occur when his heart rates are in the 40s to 50s.  He has SVT and is status post ablation 09/21/2018.  He has had recurrent SVT and is now on Cardizem.  Today, denies symptoms of palpitations, chest pain, shortness of breath, orthopnea, PND, lower extremity edema, claudication, dizziness, presyncope, syncope, bleeding, or neurologic sequela. The patient is tolerating medications without difficulties.  He is currently feeling well.  He has had no further episodes of SVT.  In April, he noted that his heart rates were quite slow.  He stopped his diltiazem which improved his heart rates.  Since stopping his diltiazem, he has had no further palpitations.   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   Hypertension    Past Surgical History:  Procedure Laterality Date   CARPAL TUNNEL RELEASE Right    COLONOSCOPY N/A 09/19/2013   Procedure: COLONOSCOPY;  Surgeon: Rogene Houston, MD;  [facility];  Service: Endoscopy;  Laterality: N/A;  830   FINGER AMPUTATION Left    4th and 5th   left eye blindness Left    SVT ABLATION N/A 09/21/2018   Procedure: SVT ABLATION;  Surgeon: Constance Haw, MD;  Location: [place] [facility];  Service: Cardiovascular;  Laterality: N/A;     Current Outpatient Medications  Medication Sig Dispense Refill   aspirin EC 81 MG tablet Take 81 mg by mouth daily.      cholecalciferol (VITAMIN D) 1000 units tablet Take 1,000 Units by mouth daily.     cloNIDine (CATAPRES) 0.1 MG tablet Take 0.1 mg by mouth as needed.      hydrochlorothiazide (HYDRODIURIL) 25 MG tablet Take 25 mg by mouth daily.      lisinopril (PRINIVIL,ZESTRIL) 40 MG tablet Take 40 mg by mouth daily.      diltiazem (CARDIZEM CD) 180 MG 24 hr capsule TAKE (1) CAPSULE BY MOUTH ONCE DAILY. (Patient not taking: Reported on 04/27/2021) 90 capsule 3   No current facility-administered medications for this visit.    Allergies:   Tamsulosin hcl and Aspirin   Social History:  The patient  reports that he quit smoking about 41 years ago. His smoking use included cigarettes. He started smoking about 64 years ago. He smoked an average of .5 packs per day. He has never used smokeless tobacco. He reports that he does not drink alcohol and does not use drugs.   Family History:  The patient's family history includes CAD in his father and mother; Cancer in his brother.   ROS:  Please see the history of present illness.   Otherwise, review of systems is positive for none.   All other systems are reviewed and negative.   PHYSICAL EXAM: VS:  BP (!) 142/84   Pulse 78   Ht '5\' 9"'$  (1.753 m)   Wt 164 lb  6.4 oz (74.6 kg)   SpO2 96%   BMI 24.28 kg/m  , BMI Body mass index is 24.28 kg/m. GEN: Well nourished, well developed, in no acute distress  HEENT: normal  Neck: no JVD, carotid bruits, or masses Cardiac: RRR; no murmurs, rubs, or gallops,no edema  Respiratory:  clear to auscultation bilaterally, normal work of breathing GI: soft, nontender, nondistended, + BS MS: no deformity or atrophy  Skin: warm and dry Neuro:  Strength and sensation are intact Psych: euthymic mood, full affect  EKG:  EKG is ordered today. Personal review of the ekg ordered shows sinus rhythm, rate 78, first-degree AV block  Recent Labs: No results found for requested labs within last 8760 hours.    Lipid Panel  No results  found for: CHOL, TRIG, HDL, CHOLHDL, VLDL, LDLCALC, LDLDIRECT   Wt Readings from Last 3 Encounters:  04/27/21 164 lb 6.4 oz (74.6 kg)  10/21/20 162 lb (73.5 kg)  04/22/20 171 lb (77.6 kg)      Other studies Reviewed: Additional studies/ records that were reviewed today include: Monitor 07/24/19 personally reviewed  Review of the above records today demonstrates:  Max 250 bpm 01:12pm, 11/05 Min 29 bpm 06:22am, 11/02 Avg 94 bpm 1.2% PVCs Less than 1% PACs Multiple episodes of SVT 23 ventricular runs fastest interval 4 beats at 250 bpm 14 pauses longest 3.5 seconds appear to be due to postconversion Intermittent episodes of junctional rhythm with accelerated junctional rhythm versus slow AVNRT   ASSESSMENT AND PLAN:  1.  SVT: Patient has a short RP tachycardia.  He has been asymptomatic in the past.  Feeling well without further episodes.  No changes at this time.  He noted that his heart rates are slow and stop his diltiazem.  He has had no further symptoms.  2.  Bradycardia/dizziness: Has had pauses and bradycardia which are mostly nocturnal.  Heart rate goes down to the 40s.  He has continued to feel well without episodes of fatigue.  If he does develop further episodes of bradycardia, pacemaker implant may be warranted.  Current medicines are reviewed at length with the patient today.   The patient does not have concerns regarding his medicines.  The following changes were made today: None  Labs/ tests ordered today include:  Orders Placed This Encounter  Procedures   EKG 12-Lead      Disposition:   FU with Milderd Manocchio as needed months  Signed, Brewer Hitchman Meredith Leeds, MD  04/27/2021 3:01 PM     [facility] [address] [place] [address] [phone] (office) [phone] (fax)

## 2021-07-28 ENCOUNTER — Other Ambulatory Visit: Payer: Self-pay

## 2021-07-28 ENCOUNTER — Ambulatory Visit (HOSPITAL_COMMUNITY)
Admission: RE | Admit: 2021-07-28 | Discharge: 2021-07-28 | Disposition: A | Discharge status: Home / Self Care | Payer: Medicare HMO | Source: Ambulatory Visit | Attending: Family Medicine | Admitting: Family Medicine

## 2021-07-28 ENCOUNTER — Other Ambulatory Visit (HOSPITAL_COMMUNITY): Payer: Self-pay | Admitting: Family Medicine

## 2021-07-28 DIAGNOSIS — Z87891 Personal history of nicotine dependence: Secondary | ICD-10-CM | POA: Diagnosis present

## 2021-07-28 DIAGNOSIS — R059 Cough, unspecified: Secondary | ICD-10-CM

## 2021-07-28 DIAGNOSIS — R0602 Shortness of breath: Secondary | ICD-10-CM | POA: Diagnosis not present

## 2021-08-06 ENCOUNTER — Other Ambulatory Visit: Payer: Self-pay

## 2021-08-06 ENCOUNTER — Emergency Department (HOSPITAL_COMMUNITY): Payer: Medicare HMO

## 2021-08-06 ENCOUNTER — Encounter (HOSPITAL_COMMUNITY): Payer: Self-pay

## 2021-08-06 ENCOUNTER — Observation Stay (HOSPITAL_COMMUNITY)
Admission: EM | Admit: 2021-08-06 | Discharge: 2021-08-08 | Disposition: A | Discharge status: Home / Self Care | Payer: Medicare HMO | Attending: Emergency Medicine | Admitting: Emergency Medicine

## 2021-08-06 DIAGNOSIS — R0602 Shortness of breath: Secondary | ICD-10-CM | POA: Diagnosis present

## 2021-08-06 DIAGNOSIS — Z20822 Contact with and (suspected) exposure to covid-19: Secondary | ICD-10-CM | POA: Insufficient documentation

## 2021-08-06 DIAGNOSIS — R2689 Other abnormalities of gait and mobility: Secondary | ICD-10-CM | POA: Diagnosis not present

## 2021-08-06 DIAGNOSIS — I10 Essential (primary) hypertension: Secondary | ICD-10-CM

## 2021-08-06 DIAGNOSIS — I495 Sick sinus syndrome: Secondary | ICD-10-CM | POA: Diagnosis not present

## 2021-08-06 DIAGNOSIS — Z87891 Personal history of nicotine dependence: Secondary | ICD-10-CM | POA: Diagnosis not present

## 2021-08-06 DIAGNOSIS — Z79899 Other long term (current) drug therapy: Secondary | ICD-10-CM | POA: Insufficient documentation

## 2021-08-06 DIAGNOSIS — D696 Thrombocytopenia, unspecified: Secondary | ICD-10-CM | POA: Insufficient documentation

## 2021-08-06 DIAGNOSIS — I471 Supraventricular tachycardia: Secondary | ICD-10-CM | POA: Diagnosis not present

## 2021-08-06 DIAGNOSIS — Z7982 Long term (current) use of aspirin: Secondary | ICD-10-CM | POA: Insufficient documentation

## 2021-08-06 DIAGNOSIS — D649 Anemia, unspecified: Secondary | ICD-10-CM | POA: Diagnosis present

## 2021-08-06 DIAGNOSIS — D539 Nutritional anemia, unspecified: Principal | ICD-10-CM | POA: Insufficient documentation

## 2021-08-06 LAB — BASIC METABOLIC PANEL WITH GFR
Anion gap: 8 (ref 5–15)
BUN: 23 mg/dL (ref 8–23)
CO2: 24 mmol/L (ref 22–32)
Calcium: 9 mg/dL (ref 8.9–10.3)
Chloride: 102 mmol/L (ref 98–111)
Creatinine, Ser: 1.16 mg/dL (ref 0.61–1.24)
GFR, Estimated: >60 mL/min
Glucose, Bld: 121 mg/dL — ABNORMAL HIGH (ref 70–99)
Potassium: 4 mmol/L (ref 3.5–5.1)
Sodium: 134 mmol/L — ABNORMAL LOW (ref 135–145)

## 2021-08-06 LAB — IRON AND TIBC
Iron: 104 ug/dL (ref 45–182)
Saturation Ratios: 37 % (ref 17.9–39.5)
TIBC: 281 ug/dL (ref 250–450)
UIBC: 177 ug/dL

## 2021-08-06 LAB — CBC
HCT: 19 % — ABNORMAL LOW (ref 39.0–52.0)
Hemoglobin: 6.4 g/dL — CL (ref 13.0–17.0)
MCH: 38.1 pg — ABNORMAL HIGH (ref 26.0–34.0)
MCHC: 33.7 g/dL (ref 30.0–36.0)
MCV: 113.1 fL — ABNORMAL HIGH (ref 80.0–100.0)
Platelets: 59 K/uL — ABNORMAL LOW (ref 150–400)
RBC: 1.68 MIL/uL — ABNORMAL LOW (ref 4.22–5.81)
RDW: 17.1 % — ABNORMAL HIGH (ref 11.5–15.5)
WBC: 5.1 K/uL (ref 4.0–10.5)
nRBC: 2.9 % — ABNORMAL HIGH (ref 0.0–0.2)

## 2021-08-06 LAB — ABO/RH: ABO/RH(D): B POS

## 2021-08-06 LAB — VITAMIN B12: Vitamin B-12: 609 pg/mL (ref 180–914)

## 2021-08-06 LAB — POC OCCULT BLOOD, ED: Stool Culture result 1 (CMPCXR): NEGATIVE — NL

## 2021-08-06 LAB — RETICULOCYTES
Immature Retic Fract: 31 % — ABNORMAL HIGH (ref 2.3–15.9)
RBC.: 1.44 MIL/uL — ABNORMAL LOW (ref 4.22–5.81)
Retic Count, Absolute: 30.1 K/uL (ref 19.0–186.0)
Retic Ct Pct: 2.1 % (ref 0.4–3.1)

## 2021-08-06 LAB — RESP PANEL BY RT-PCR (FLU A&B, COVID) ARPGX2
Influenza A by PCR: NEGATIVE
Influenza B by PCR: NEGATIVE
SARS Coronavirus 2 by RT PCR: NEGATIVE

## 2021-08-06 LAB — PREPARE RBC (CROSSMATCH)

## 2021-08-06 LAB — FOLATE: Folate: 16.6 ng/mL (ref >5.9)

## 2021-08-06 LAB — FERRITIN: Ferritin: 244 ng/mL (ref 24–336)

## 2021-08-06 MED ORDER — FUROSEMIDE 10 MG/ML IJ SOLN
20.0000 mg | Freq: Once | INTRAMUSCULAR | Status: AC
  Administered 2021-08-06: 22:00:00 20 mg via INTRAVENOUS
  Filled 2021-08-06: qty 2

## 2021-08-06 MED ORDER — SODIUM CHLORIDE 0.9 % IV SOLN
10.0000 mL/h | Freq: Once | INTRAVENOUS | Status: AC
  Administered 2021-08-06: 20:00:00 10 mL/h via INTRAVENOUS

## 2021-08-06 MED ORDER — PANTOPRAZOLE SODIUM 40 MG IV SOLR
40.0000 mg | INTRAVENOUS | Status: DC
  Administered 2021-08-06: 22:00:00 40 mg via INTRAVENOUS
  Filled 2021-08-06: qty 40

## 2021-08-06 MED ORDER — POLYETHYLENE GLYCOL 3350 17 G PO PACK: 17.0000 g | PACK | Freq: Every day | ORAL | Status: DC | PRN

## 2021-08-06 MED ORDER — ONDANSETRON HCL 4 MG PO TABS: 4.0000 mg | ORAL_TABLET | Freq: Four times a day (QID) | ORAL | Status: DC | PRN

## 2021-08-06 MED ORDER — LISINOPRIL 10 MG PO TABS
40.0000 mg | ORAL_TABLET | Freq: Every day | ORAL | Status: DC
  Administered 2021-08-07 – 2021-08-08 (×2): 40 mg via ORAL
  Filled 2021-08-06 (×2): qty 4

## 2021-08-06 MED ORDER — ACETAMINOPHEN 650 MG RE SUPP: 650.0000 mg | Freq: Four times a day (QID) | RECTAL | Status: DC | PRN

## 2021-08-06 MED ORDER — ACETAMINOPHEN 325 MG PO TABS: 650.0000 mg | ORAL_TABLET | Freq: Four times a day (QID) | ORAL | Status: DC | PRN

## 2021-08-06 MED ORDER — CLONIDINE HCL 0.1 MG PO TABS
0.1000 mg | ORAL_TABLET | Freq: Every day | ORAL | Status: DC
  Administered 2021-08-07 – 2021-08-08 (×2): 0.1 mg via ORAL
  Filled 2021-08-06 (×2): qty 1

## 2021-08-06 MED ORDER — ONDANSETRON HCL 4 MG/2ML IJ SOLN: 4.0000 mg | Freq: Four times a day (QID) | INTRAMUSCULAR | Status: DC | PRN

## 2021-08-06 MED ORDER — HYDROCHLOROTHIAZIDE 25 MG PO TABS
25.0000 mg | ORAL_TABLET | Freq: Every day | ORAL | Status: DC
  Administered 2021-08-07 – 2021-08-08 (×2): 25 mg via ORAL
  Filled 2021-08-06 (×2): qty 1

## 2021-08-06 NOTE — ED Triage Notes (Signed)
Reports sob x 3 weeks that has been getting worse.  Reports he was on heart meds and then got better so he got off of them and then he got into some mold and his breathing ahs got worse

## 2021-08-06 NOTE — ED Provider Notes (Signed)
[facility] EMERGENCY DEPARTMENT Provider Note   CSN: [id number] Arrival date & time: 08/06/21  1509     History Chief Complaint  Patient presents with   Shortness of Breath    Dustin Tucker is a 87 y.o. male with a history of hypertension, tachybradycardia syndrome, SVT s/p ablation 09/2018,   paroxysmal atrial fibrillation not on blood thinners presenting for evaluation of increasing shortness of breath over the past 3 weeks.  He states he was exposed to some mold in his home and is concerned that this exposure is causing his shortness of breath.  He does report occasional wheezing.  He reports intermittent problems with his breathing, worsened with exertion but has been intermittent, stating he cannot walk across the room 1 day and be okay and then the next day walking across room makes him very winded.  He denies chest pain, palpitations, cough, peripheral neuropathy, he denies orthopnea.  He was seen by his PCP and sent to the hospital for chest x-ray and respiratory panel on December 20, but these tests were negative.  His symptoms are resolved at rest.  The history is provided by the patient.      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   Hypertension     Patient Active Problem List   Diagnosis Date Noted   Tachy-brady syndrome ([place]) 09/20/2018   Nausea and vomiting 09/20/2018   Essential hypertension 09/20/2018   Atrial fibrillation with rapid ventricular response (HCC)    PSVT (paroxysmal supraventricular tachycardia) ([place]) 03/24/2015   Near syncope 12/13/2014    Past Surgical History:  Procedure Laterality Date   CARPAL TUNNEL RELEASE Right    COLONOSCOPY N/A 09/19/2013   Procedure: COLONOSCOPY;  Surgeon: Rogene Houston, MD;  [facility];  Service: Endoscopy;  Laterality: N/A;  830   FINGER AMPUTATION Left    4th and 5th   left eye blindness Left    SVT ABLATION N/A 09/21/2018   Procedure:  SVT ABLATION;  Surgeon: Constance Haw, MD;  Location: [facility] [facility];  Service: Cardiovascular;  Laterality: N/A;       Family History  Problem Relation Age of Onset   CAD Mother    CAD Father    Cancer Brother     Social History   Tobacco Use   Smoking status: Former    Packs/day: 0.50    Types: Cigarettes    Start date: 08/09/1956    Quit date: 08/10/1979    Years since quitting: 42.0   Smokeless tobacco: Never  Vaping Use   Vaping Use: Never used  Substance Use Topics   Alcohol use: No    Alcohol/week: 0.0 standard drinks   Drug use: No    Home Medications Prior to Admission medications   Medication Sig Start Date End Date Taking? Authorizing Provider  albuterol (VENTOLIN HFA) 108 (90 Base) MCG/ACT inhaler Inhale 2 puffs into the lungs every 6 (six) hours as needed. 07/28/21   [provider]  aspirin EC 81 MG tablet Take 81 mg by mouth daily.    [provider]  cholecalciferol (VITAMIN D) 1000 units tablet Take 1,000 Units by mouth daily.    [provider]  cloNIDine (CATAPRES) 0.1 MG tablet Take 0.1 mg by mouth as needed.  05/16/18   [provider]  hydrochlorothiazide (HYDRODIURIL) 25 MG tablet Take 25 mg by mouth daily.  06/25/16   [provider]  levocetirizine (XYZAL) 5 MG tablet Take 5 mg by mouth daily. 07/28/21   [provider]  lisinopril (PRINIVIL,ZESTRIL) 40 MG tablet Take 40 mg by mouth daily.  06/09/16   [provider]    Allergies    Tamsulosin hcl and Aspirin  Review of Systems   Review of Systems  Constitutional:  Negative for fever.  HENT:  Negative for congestion and sore throat.   Eyes: Negative.   Respiratory:  Positive for shortness of breath and wheezing. Negative for chest tightness.   Cardiovascular:  Negative for chest pain.  Gastrointestinal:  Negative for abdominal pain, blood in stool, nausea and vomiting.  Genitourinary: Negative.   Musculoskeletal:   Negative for arthralgias, joint swelling and neck pain.  Skin: Negative.  Negative for rash and wound.  Neurological:  Negative for dizziness, weakness, light-headedness, numbness and headaches.  Psychiatric/Behavioral: Negative.     Physical Exam Updated Vital Signs BP 119/62    Pulse 70    Temp 98.3 F (36.8 C) (Oral)    Resp (!) 22    Ht 5\' 9"  (1.753 m)    Wt 72.6 kg    SpO2 99%    BMI 23.63 kg/m   Physical Exam Vitals and nursing note reviewed. Exam conducted with a chaperone present.  Constitutional:      Appearance: He is well-developed.  HENT:     Head: Normocephalic and atraumatic.  Eyes:     Conjunctiva/sclera: Conjunctivae normal.     Comments: Pale conjunctiva  Cardiovascular:     Rate and Rhythm: Normal rate and regular rhythm.     Heart sounds: Normal heart sounds.  Pulmonary:     Effort: Pulmonary effort is normal.     Breath sounds: Normal breath sounds. No wheezing, rhonchi or rales.  Abdominal:     General: Bowel sounds are normal.     Palpations: Abdomen is soft.     Tenderness: There is no abdominal tenderness.  Genitourinary:    Rectum: Guaiac result negative.  Musculoskeletal:        General: Normal range of motion.     Cervical back: Normal range of motion.  Skin:    General: Skin is warm and dry.  Neurological:     Mental Status: He is alert.    ED Results / Procedures / Treatments   Labs (all labs ordered are listed, but only abnormal results are displayed) Labs Reviewed  BASIC METABOLIC PANEL - Abnormal; Notable for the following components:      Result Value   Sodium 134 (*)    Glucose, Bld 121 (*)    All other components within normal limits  CBC - Abnormal; Notable for the following components:   RBC 1.68 (*)    Hemoglobin 6.4 (*)    HCT 19.0 (*)    MCV 113.1 (*)    MCH 38.1 (*)    RDW 17.1 (*)    Platelets 59 (*)    nRBC 2.9 (*)    All other components within normal limits  POC OCCULT BLOOD, ED - Abnormal; Notable for the  following components:   Stool Culture result 1 (CMPCXR) NEGATIVE (*)    All other components within normal limits  RESP PANEL BY RT-PCR (FLU A&B, COVID) ARPGX2  TYPE AND SCREEN  PREPARE RBC (CROSSMATCH)    EKG EKG Interpretation  Date/Time:  Thursday August 06 2021 15:24:55 EST Ventricular Rate:  90 PR Interval:  262 QRS Duration: 86  QT Interval:  314 QTC Calculation: 384 R Axis:   66 Text Interpretation: Sinus rhythm with 1st degree A-V block with occasional Premature ventricular complexes T wave abnormality, consider inferior ischemia Abnormal ECG When compared with ECG of 22-Sep-2018 04:08, Premature ventricular complexes are now Present Premature atrial complexes are no longer Present T wave inversion now evident in Inferior leads QT has shortened Confirmed by Noemi Chapel [phone]) on 08/06/2021 4:10:29 PM  Radiology DG Chest 2 View  Result Date: 08/06/2021 CLINICAL DATA:  Worsening shortness of breath over the past 3 weeks. EXAM: CHEST - 2 VIEW COMPARISON:  Chest x-ray dated July 28, 2021. FINDINGS: The heart size and mediastinal contours are within normal limits. Both lungs are clear. Unchanged right nipple shadow. The visualized skeletal structures are unremarkable. IMPRESSION: No active cardiopulmonary disease. Electronically Signed   By: Titus Dubin M.D.   On: 08/06/2021 15:56    Procedures Procedures   Medications Ordered in ED Medications  0.9 %  sodium chloride infusion (has no administration in time range)    ED Course  I have reviewed the triage vital signs and the nursing notes.  Pertinent labs & imaging results that were available during my care of the patient were reviewed by me and considered in my medical decision making (see chart for details).    MDM Rules/Calculators/A&P                         Pt presenting with symptomatic anemia.  2 L of packed RBCs have been ordered. Compared to the last hemoglobin of 15.5 which was obtained 2 years ago.   Patient is Hemoccult occult negative.  His anemia is macrocytic.  He denies alcohol use.  Patient will require admission, discussed with Dr. Denton Brick who accepts patient for admission.   CRITICAL CARE Performed by: Evalee Jefferson Total critical care time: 40 minutes Critical care time was exclusive of separately billable procedures and treating other patients. Critical care was necessary to treat or prevent imminent or life-threatening deterioration. Critical care was time spent personally by me on the following activities: development of treatment plan with patient and/or surrogate as well as nursing, discussions with consultants, evaluation of patient's response to treatment, examination of patient, obtaining history from patient or surrogate, ordering and performing treatments and interventions, ordering and review of laboratory studies, ordering and review of radiographic studies, pulse oximetry and re-evaluation of patient's condition.  Final Clinical Impression(s) / ED Diagnoses Final diagnoses:  Symptomatic anemia    Rx / DC Orders ED Discharge Orders     None        Landis Martins 08/06/21 Ardeen Garland, MD 08/08/21 1104

## 2021-08-06 NOTE — H&P (Addendum)
History and Physical    Dustin Tucker ERX:[id number] DOB: March 01, 1932 DOA: 08/06/2021  PCP: Jani Gravel, MD   Patient coming from: Home  I have personally briefly reviewed patient's old medical records in [place]  Chief Complaint: Difficulty breathing.  HPI: Dustin Tucker is a 87 y.o. male with medical history significant for atrial fibrillation, hypertension, PSVT. Presented to the ED with complaints of difficulty breathing of 3 weeks duration probably longer because only with exertion.  No chest pain.  No lower extremity swelling.  He denies black stools, no bloody stools no vomiting of blood. Over the past week he has had upper abdominal pain, that resolved after he eats.  A week ago he started taking ibuprofen 200 mg every day, but prior to that, he occasionally will take ibuprofen 2-3 times a month.  Reports he has had colonoscopies in the past. Reports poor oral intake due to poor appetite over the past several weeks.  ED Course: Patient 40.3.  Heart rate mostly 70s to 91.  Respiratory rate 16-22.  Blood pressure systolic 950D to 326Z.  O2 sats 98-100% on room air.  Hemoglobin 6.5,  down from 15 checked 2 years ago.  Stool FOBT negative.  2 units of blood ordered for transfusion.  Hospitalist to admit.  Review of Systems: As per HPI all other systems reviewed and negative.  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   Hypertension     Past Surgical History:  Procedure Laterality Date   CARPAL TUNNEL RELEASE Right    COLONOSCOPY N/A 09/19/2013   Procedure: COLONOSCOPY;  Surgeon: Rogene Houston, MD;  [facility];  Service: Endoscopy;  Laterality: N/A;  830   FINGER AMPUTATION Left    4th and 5th   left eye blindness Left    SVT ABLATION N/A 09/21/2018   Procedure: SVT ABLATION;  Surgeon: Constance Haw, MD;  Location: [place] [facility];  Service: Cardiovascular;  Laterality: N/A;      reports that he quit smoking about 42 years ago. His smoking use included cigarettes. He started smoking about 65 years ago. He smoked an average of .5 packs per day. He has never used smokeless tobacco. He reports that he does not drink alcohol and does not use drugs.  Allergies  Allergen Reactions   Tamsulosin Hcl Nausea Only   Aspirin Other (See Comments)    Gi upset, can tolerate baby aspirin    Family History  Problem Relation Age of Onset   CAD Mother    CAD Father    Cancer Brother     Prior to Admission medications   Medication Sig Start Date End Date Taking? Authorizing Provider  albuterol (VENTOLIN HFA) 108 (90 Base) MCG/ACT inhaler Inhale 2 puffs into the lungs every 6 (six) hours as needed. 07/28/21  Yes [provider]  aspirin EC 81 MG tablet Take 81 mg by mouth daily.   Yes [provider]  cholecalciferol (VITAMIN D) 1000 units tablet Take 1,000 Units by mouth daily.   Yes [provider]  cloNIDine (CATAPRES) 0.1 MG tablet Take 0.1 mg by mouth daily. 05/16/18  Yes [provider]  hydrochlorothiazide (HYDRODIURIL) 25 MG tablet Take 25 mg by mouth daily.  06/25/16  Yes [provider]  ibuprofen (ADVIL) 200 MG tablet Take 200 mg by mouth every 6 (six) hours as needed.  Yes [provider]  levocetirizine (XYZAL) 5 MG tablet Take 5 mg by mouth daily. 07/28/21  Yes [provider]  lisinopril (PRINIVIL,ZESTRIL) 40 MG tablet Take 40 mg by mouth daily.  06/09/16  Yes [provider]    Physical Exam: Vitals:   08/06/21 1830 08/06/21 1945 08/06/21 2011 08/06/21 2101  BP: 119/62 (!) 142/61 (!) 149/67 (!) 152/81  Pulse: 70 71 76 77  Resp: (!) 22 20 20    Temp:  97.9 F (36.6 C) 98 F (36.7 C) 98 F (36.7 C)  TempSrc:  Oral Oral Oral  SpO2: 99% 100% 100% 100%  Weight:      Height:        Constitutional: NAD, calm, comfortable Vitals:   08/06/21 1830 08/06/21 1945 08/06/21 2011 08/06/21 2101   BP: 119/62 (!) 142/61 (!) 149/67 (!) 152/81  Pulse: 70 71 76 77  Resp: (!) 22 20 20    Temp:  97.9 F (36.6 C) 98 F (36.7 C) 98 F (36.7 C)  TempSrc:  Oral Oral Oral  SpO2: 99% 100% 100% 100%  Weight:      Height:       Eyes: PERRL, lids and conjunctivae normal ENMT: Mucous membranes are moist.  Neck: normal, supple, no masses, no thyromegaly Respiratory: clear to auscultation bilaterally, no wheezing, no crackles. Normal respiratory effort. No accessory muscle use.  Cardiovascular: Regular rate and rhythm, no murmurs / rubs / gallops. No extremity edema. 2+ pedal pulses. No carotid bruits.  Abdomen: no tenderness, no masses palpated. No hepatosplenomegaly. Bowel sounds positive.  Musculoskeletal: no clubbing / cyanosis. No joint deformity upper and lower extremities. Good ROM, no contractures. Normal muscle tone.  Skin: no rashes, lesions, ulcers. No induration Neurologic: No apparent cranial nerve abnormality moving extremity spontaneously.  Psychiatric: Normal judgment and insight. Alert and oriented x 3. Normal mood.   Labs on Admission: I have personally reviewed following labs and imaging studies  CBC: Recent Labs  Lab 08/06/21 1531  WBC 5.1  HGB 6.4*  HCT 19.0*  MCV 113.1*  PLT 59*   Basic Metabolic Panel: Recent Labs  Lab 08/06/21 1531  NA 134*  K 4.0  CL 102  CO2 24  GLUCOSE 121*  BUN 23  CREATININE 1.16  CALCIUM 9.0   Anemia Panel: Recent Labs    08/06/21 1911  VITAMINB12 609  FOLATE 16.6  FERRITIN 244  TIBC 281  IRON 104  RETICCTPCT 2.1   Urine analysis:    Component Value Date/Time   COLORURINE YELLOW 06/21/2016 1502   APPEARANCEUR Clear 10/21/2020 1618   LABSPEC <1.005 (L) 06/21/2016 1502   PHURINE 6.0 06/21/2016 1502   GLUCOSEU Negative 10/21/2020 1618   HGBUR NEGATIVE 06/21/2016 1502   BILIRUBINUR Negative 10/21/2020 1618   KETONESUR NEGATIVE 06/21/2016 1502   PROTEINUR Negative 10/21/2020 1618   PROTEINUR NEGATIVE 06/21/2016  1502   UROBILINOGEN 0.2 08/24/2014 1720   NITRITE Negative 10/21/2020 1618   NITRITE NEGATIVE 06/21/2016 1502   LEUKOCYTESUR Negative 10/21/2020 1618    Radiological Exams on Admission: DG Chest 2 View  Result Date: 08/06/2021 CLINICAL DATA:  Worsening shortness of breath over the past 3 weeks. EXAM: CHEST - 2 VIEW COMPARISON:  Chest x-ray dated July 28, 2021. FINDINGS: The heart size and mediastinal contours are within normal limits. Both lungs are clear. Unchanged right nipple shadow. The visualized skeletal structures are unremarkable. IMPRESSION: No active cardiopulmonary disease. Electronically Signed   By: Titus Dubin M.D.   On: 08/06/2021 15:56  EKG: Independently reviewed.  First-degree AV block, PR interval 262.  PVCs present.  Sinus rhythm rate 90.   Assessment/Plan Principal Problem:   Acute anemia Active Problems:   PSVT (paroxysmal supraventricular tachycardia) (HCC)   Tachy-brady syndrome (HCC)   Essential hypertension  Acute symptomatic macrocytic anemia-with dyspnea on exertion, hemoglobin 6.4 today down from 15 checks 2 years ago.  Reports increased NSAID use over the past week, upper abdominal pain.  Stool FOBT negative.  Denies melena hematochezia or hematemesis.  Not on anticoagulation.  Last colonoscopy 2014 unable to view results. -Transfuse 2 units - IV Lasix 20 mg with transfusion -Anemia panel, including B12 and folate unremarkable, and not suggestive of etiology of anemia. -Concurrent thrombocytopenia -N.p.o. midnight if GI eval is needed, or GI etiology suspected -May need hematology input -Start IV Protonix 40 daily -Monitor hemoglobin - hold home aspirin  Thrombocytopenia-59, last check 2 years ago platelets were 155. -May need hematology input  Hypertension-110s to 160s. -Resume clonidine, HCTZ, lisinopril  Tachybradycardia syndrome, PSVT-s/p ablation for SVT 2020. Follows with Dr. Curt Bears.   DVT prophylaxis:  SCDS -  thrombocytopenia Code Status: Full code Family Communication: None at bedside Disposition Plan:  ~ 2 days Consults called: None Admission status: Obs tele  I certify that at the point of admission it is my clinical judgment that the patient will require inpatient hospital care spanning beyond 2 midnights from the point of admission due to high intensity of service, high risk for further deterioration and high frequency of surveillance required.   Bethena Roys MD [facility]  08/06/2021, 9:28 PM

## 2021-08-07 ENCOUNTER — Encounter (HOSPITAL_COMMUNITY): Payer: Self-pay | Admitting: Internal Medicine

## 2021-08-07 DIAGNOSIS — D649 Anemia, unspecified: Secondary | ICD-10-CM | POA: Diagnosis not present

## 2021-08-07 LAB — CBC
HCT: 22 % — ABNORMAL LOW (ref 39.0–52.0)
HCT: 22.7 % — ABNORMAL LOW (ref 39.0–52.0)
Hemoglobin: 7.5 g/dL — ABNORMAL LOW (ref 13.0–17.0)
Hemoglobin: 7.6 g/dL — ABNORMAL LOW (ref 13.0–17.0)
MCH: 33.6 pg (ref 26.0–34.0)
MCH: 32.8 pg (ref 26.0–34.0)
MCHC: 34.1 g/dL (ref 30.0–36.0)
MCHC: 33.5 g/dL (ref 30.0–36.0)
MCV: 98.7 fL (ref 80.0–100.0)
MCV: 97.8 fL (ref 80.0–100.0)
Platelets: 43 10*3/uL — ABNORMAL LOW (ref 150–400)
Platelets: 43 10*3/uL — ABNORMAL LOW (ref 150–400)
RBC: 2.23 MIL/uL — ABNORMAL LOW (ref 4.22–5.81)
RBC: 2.32 MIL/uL — ABNORMAL LOW (ref 4.22–5.81)
RDW: 23.3 % — ABNORMAL HIGH (ref 11.5–15.5)
RDW: 23.2 % — ABNORMAL HIGH (ref 11.5–15.5)
WBC: 3.4 10*3/uL — ABNORMAL LOW (ref 4.0–10.5)
WBC: 3.7 10*3/uL — ABNORMAL LOW (ref 4.0–10.5)
nRBC: 2.6 % — ABNORMAL HIGH (ref 0.0–0.2)
nRBC: 2.7 % — ABNORMAL HIGH (ref 0.0–0.2)

## 2021-08-07 MED ORDER — PANTOPRAZOLE SODIUM 40 MG PO TBEC
40.0000 mg | DELAYED_RELEASE_TABLET | Freq: Every day | ORAL | Status: DC
  Administered 2021-08-07 – 2021-08-08 (×2): 40 mg via ORAL
  Filled 2021-08-07 (×2): qty 1

## 2021-08-07 NOTE — Care Management Obs Status (Signed)
[facility] NOTIFICATION   Patient Details  Name: Dustin Tucker MRN: [id number] Date of Birth: 1932-03-03   Medicare Observation Status Notification Given:  Yes    Boneta Lucks, RN 08/07/2021, 2:07 PM

## 2021-08-07 NOTE — Progress Notes (Signed)
Dustin Tucker  HFW:[id number] DOB: 1932-02-03 DOA: 08/06/2021 PCP: Jani Gravel, MD    Brief Narrative:  87 year old with a history of atrial fibrillation, HTN, and PSVT who presented to the ED with 3 weeks of progressively worsening dyspnea on exertion.  He denied lower extremity swelling, chest pain, melena, or hematochezia.  No hematemesis.  He did report intermittent abdominal pain which seems to improve with meals.  He admitted to taking ibuprofen 200 mg daily for approximately 1 week.  In the ED he was found to have a hemoglobin of 6.5 with a baseline of 15 two years prior.  He was Hemoccult negative.   Consultants:  None  Code Status: FULL CODE  Antimicrobials:  None  DVT prophylaxis: SCDs  Subjective: Resting comfortably in bed. States he feels better s/p transfusion. Eating meals w/o difficulty. Denies melena or hematochezia. No abdom pain at present. No chest pain or sob.   Assessment & Plan:  Symptomatic acute versus subacute macrocytic anemia History suggest gradually worsening anemia - no signif hx of ongoing high dose NSAID use - Hemoccult negative - denies melena or hematochezia - no hematemesis - transfused 2U PRBC thus far - continue empiric Protonix for now - O27 and folic acid normal - iron not low - TIBC is low normal - ferritin 244 - follow Hgb trend - if remains stable overnight may be able to d/c home for outpt eval (likely to include a bone marrow bx/Heme consult)  Thrombocytopenia Platelet count 155 2 years ago - ? MDS -monitor trend  Hyperglycemia Check A1c  HTN Blood pressure stable presently  PSVT -tachybradycardia syndrome Status post ablation 2020 -followed by Dr. Curt Bears -stable at present   Family Communication: no family present at time of exam    Objective: Blood pressure (!) 132/59, pulse 70, temperature 97.9 F (36.6 C), temperature source Oral, resp. rate 16, height 5\' 9"  (1.753 m), weight 72.6 kg, SpO2 99 %.  Intake/Output Summary  (Last 24 hours) at 08/07/2021 1409 Last data filed at 08/07/2021 0900 Gross per 24 hour  Intake 976 ml  Output --  Net 976 ml   Filed Weights   08/06/21 1520  Weight: 72.6 kg    Examination: General: No acute respiratory distress Lungs: Clear to auscultation bilaterally without wheezes or crackles Cardiovascular: Regular rate and rhythm without murmur gallop or rub normal S1 and S2 Abdomen: Nontender, nondistended, soft, bowel sounds positive, no rebound, no ascites, no appreciable mass Extremities: No significant cyanosis, clubbing, or edema bilateral lower extremities  CBC: Recent Labs  Lab 08/06/21 1531 08/07/21 0525 08/07/21 0736  WBC 5.1 3.4* 3.7*  HGB 6.4* 7.5* 7.6*  HCT 19.0* 22.0* 22.7*  MCV 113.1* 98.7 97.8  PLT 59* 43* 43*   Basic Metabolic Panel: Recent Labs  Lab 08/06/21 1531  NA 134*  K 4.0  CL 102  CO2 24  GLUCOSE 121*  BUN 23  CREATININE 1.16  CALCIUM 9.0   GFR: Estimated Creatinine Clearance: 44.9 mL/min (by C-G formula based on SCr of 1.16 mg/dL).  Liver Function Tests: No results for input(s): AST, ALT, ALKPHOS, BILITOT, PROT, ALBUMIN in the last 168 hours. No results for input(s): LIPASE, AMYLASE in the last 168 hours. No results for input(s): AMMONIA in the last 168 hours.   Recent Results (from the past 240 hour(s))  Resp Panel by RT-PCR (Flu A&B, Covid) Nasopharyngeal Swab     Status: None   Collection Time: 08/06/21  4:33 PM   Specimen: Nasopharyngeal Swab; Nasopharyngeal(NP)  swabs in vial transport medium  Result Value Ref Range Status   SARS Coronavirus 2 by RT PCR NEGATIVE NEGATIVE Final    Comment: (NOTE) SARS-CoV-2 target nucleic acids are NOT DETECTED.  The SARS-CoV-2 RNA is generally detectable in upper respiratory specimens during the acute phase of infection. The lowest concentration of SARS-CoV-2 viral copies this assay can detect is 138 copies/mL. A negative result does not preclude SARS-Cov-2 infection and should not  be used as the sole basis for treatment or other patient management decisions. A negative result may occur with  improper specimen collection/handling, submission of specimen other than nasopharyngeal swab, presence of viral mutation(s) within the areas targeted by this assay, and inadequate number of viral copies(<138 copies/mL). A negative result must be combined with clinical observations, patient history, and epidemiological information. The expected result is Negative.  Fact Sheet for Patients:  EntrepreneurPulse.com.au  Fact Sheet for Healthcare Providers:  IncredibleEmployment.be  This test is no t yet approved or cleared by the [place] FDA and  has been authorized for detection and/or diagnosis of SARS-CoV-2 by FDA under an Emergency Use Authorization (EUA). This EUA will remain  in effect (meaning this test can be used) for the duration of the COVID-19 declaration under Section 564(b)(1) of the Act, 21 U.S.C.section 360bbb-3(b)(1), unless the authorization is terminated  or revoked sooner.       Influenza A by PCR NEGATIVE NEGATIVE Final   Influenza B by PCR NEGATIVE NEGATIVE Final    Comment: (NOTE) The Xpert Xpress SARS-CoV-2/FLU/RSV plus assay is intended as an aid in the diagnosis of influenza from Nasopharyngeal swab specimens and should not be used as a sole basis for treatment. Nasal washings and aspirates are unacceptable for Xpert Xpress SARS-CoV-2/FLU/RSV testing.  Fact Sheet for Patients: EntrepreneurPulse.com.au  Fact Sheet for Healthcare Providers: IncredibleEmployment.be  This test is not yet approved or cleared by the [place] FDA and has been authorized for detection and/or diagnosis of SARS-CoV-2 by FDA under an Emergency Use Authorization (EUA). This EUA will remain in effect (meaning this test can be used) for the duration of the COVID-19 declaration under Section  564(b)(1) of the Act, 21 U.S.C. section 360bbb-3(b)(1), unless the authorization is terminated or revoked.  Performed at [facility], [address], [place], [place] [address]      Scheduled Meds:  cloNIDine  0.1 mg Oral Daily   hydrochlorothiazide  25 mg Oral Daily   lisinopril  40 mg Oral Daily   pantoprazole  40 mg Oral Daily     LOS: 0 days   Cherene Altes, MD [facility] Office  [phone] Pager - Text Page per Shea Evans  If 7PM-7AM, please contact night-coverage per Amion 08/07/2021, 2:09 PM

## 2021-08-07 NOTE — Evaluation (Signed)
Physical Therapy Evaluation Patient Details Name: Dustin Tucker MRN: [id number] DOB: March 08, 1932 Today's Date: 08/07/2021  History of Present Illness  Dustin Tucker is a 87 y.o. male with medical history significant for atrial fibrillation, hypertension, PSVT.  Presented to the ED with complaints of difficulty breathing of 3 weeks duration probably longer because only with exertion.  No chest pain.  No lower extremity swelling.  He denies black stools, no bloody stools no vomiting of blood.  Over the past week he has had upper abdominal pain, that resolved after he eats.  A week ago he started taking ibuprofen 200 mg every day, but prior to that, he occasionally will take ibuprofen 2-3 times a month.  Reports he has had colonoscopies in the past.  Reports poor oral intake due to poor appetite over the past several weeks.   Clinical Impression  Patient functioning at baseline for functional mobility and gait demonstrating good return for ambulation in room and hallways without loss of balance.  Patient encouraged to ambulate ad lib for length of stay in hospital.  Plan:  Patient discharged from physical therapy to care of nursing for ambulation daily as tolerated for length of stay.         Recommendations for follow up therapy are one component of a multi-disciplinary discharge planning process, led by the attending physician.  Recommendations may be updated based on patient status, additional functional criteria and insurance authorization.  Follow Up Recommendations No PT follow up    Assistance Recommended at Discharge PRN  Functional Status Assessment Patient has not had a recent decline in their functional status  Equipment Recommendations  None recommended by PT    Recommendations for Other Services       Precautions / Restrictions Precautions Precautions: None Restrictions Weight Bearing Restrictions: No      Mobility  Bed Mobility Overal bed mobility: Independent                   Transfers Overall transfer level: Independent                      Ambulation/Gait Ambulation/Gait assistance: Independent;Modified independent (Device/Increase time) Gait Distance (Feet): 200 Feet Assistive device: None Gait Pattern/deviations: WFL(Within Functional Limits) Gait velocity: slightly decreased     General Gait Details: demonstrates good return for ambulation in room and hallways without loss of balance  Stairs            Wheelchair Mobility    Modified Rankin (Stroke Patients Only)       Balance Overall balance assessment: Independent                                           Pertinent Vitals/Pain Pain Assessment: 0-10 Pain Score: 8  Pain Location: posterior neck from sitting in chair for long periods Pain Descriptors / Indicators: Sore Pain Intervention(s): Limited activity within patient's tolerance;Monitored during session;Repositioned    Home Living Family/patient expects to be discharged to:: Private residence Living Arrangements: Alone Available Help at Discharge: Family;Available PRN/intermittently Type of Home: House Home Access: Stairs to enter Entrance Stairs-Rails: None Entrance Stairs-Number of Steps: 1   Home Layout: One level;Laundry or work area in basement;Other (Comment) (patient states he does not go into basement) Home Equipment: Cane - single point;Grab bars - tub/shower;Other (comment) Additional Comments: shower stool    Prior Function Prior  Level of Function : Independent/Modified Independent             Mobility Comments: Data processing manager, drives, does shopping ADLs Comments: Independent     Hand Dominance        Extremity/Trunk Assessment   Upper Extremity Assessment Upper Extremity Assessment: Overall WFL for tasks assessed    Lower Extremity Assessment Lower Extremity Assessment: Overall WFL for tasks assessed    Cervical / Trunk Assessment Cervical  / Trunk Assessment: Normal  Communication   Communication: No difficulties  Cognition Arousal/Alertness: Awake/alert Behavior During Therapy: WFL for tasks assessed/performed Overall Cognitive Status: Within Functional Limits for tasks assessed                                          General Comments      Exercises     Assessment/Plan    PT Assessment Patient does not need any further PT services  PT Problem List         PT Treatment Interventions      PT Goals (Current goals can be found in the Care Plan section)  Acute Rehab PT Goals Patient Stated Goal: return home PT Goal Formulation: With patient Time For Goal Achievement: 08/07/21 Potential to Achieve Goals: Good    Frequency     Barriers to discharge        Co-evaluation               AM-PAC PT "6 Clicks" Mobility  Outcome Measure Help needed turning from your back to your side while in a flat bed without using bedrails?: None Help needed moving from lying on your back to sitting on the side of a flat bed without using bedrails?: None Help needed moving to and from a bed to a chair (including a wheelchair)?: None Help needed standing up from a chair using your arms (e.g., wheelchair or bedside chair)?: None Help needed to walk in hospital room?: None Help needed climbing 3-5 steps with a railing? : None 6 Click Score: 24    End of Session   Activity Tolerance: Patient tolerated treatment well Patient left: in chair;with call bell/phone within reach Nurse Communication: Mobility status PT Visit Diagnosis: Unsteadiness on feet (R26.81);Other abnormalities of gait and mobility (R26.89);Muscle weakness (generalized) (M62.81)    Time: 5409-8119 PT Time Calculation (min) (ACUTE ONLY): 20 min   Charges:   PT Evaluation $PT Eval Moderate Complexity: 1 Mod PT Treatments $Therapeutic Activity: 8-22 mins        3:33 PM, 08/07/21 Lonell Grandchild, MPT Physical Therapist with  [facility] 336 [phone] office [phone] mobile phone

## 2021-08-08 DIAGNOSIS — D649 Anemia, unspecified: Secondary | ICD-10-CM | POA: Diagnosis not present

## 2021-08-08 LAB — HEMOGLOBIN A1C
Hgb A1c MFr Bld: 6.4 % — ABNORMAL HIGH (ref 4.8–5.6)
Mean Plasma Glucose: 136.98 mg/dL

## 2021-08-08 LAB — COMPREHENSIVE METABOLIC PANEL
ALT: 11 U/L (ref 0–44)
AST: 14 U/L — ABNORMAL LOW (ref 15–41)
Albumin: 3.4 g/dL — ABNORMAL LOW (ref 3.5–5.0)
Alkaline Phosphatase: 44 U/L (ref 38–126)
Anion gap: 8 (ref 5–15)
BUN: 29 mg/dL — ABNORMAL HIGH (ref 8–23)
CO2: 25 mmol/L (ref 22–32)
Calcium: 8.6 mg/dL — ABNORMAL LOW (ref 8.9–10.3)
Chloride: 106 mmol/L (ref 98–111)
Creatinine, Ser: 1.22 mg/dL (ref 0.61–1.24)
GFR, Estimated: 57 mL/min — ABNORMAL LOW (ref >60)
Glucose, Bld: 104 mg/dL — ABNORMAL HIGH (ref 70–99)
Potassium: 4.1 mmol/L (ref 3.5–5.1)
Sodium: 139 mmol/L (ref 135–145)
Total Bilirubin: 0.9 mg/dL (ref 0.3–1.2)
Total Protein: 5.8 g/dL — ABNORMAL LOW (ref 6.5–8.1)

## 2021-08-08 LAB — CBC
HCT: 22.7 % — ABNORMAL LOW (ref 39.0–52.0)
Hemoglobin: 7.6 g/dL — ABNORMAL LOW (ref 13.0–17.0)
MCH: 33 pg (ref 26.0–34.0)
MCHC: 33.5 g/dL (ref 30.0–36.0)
MCV: 98.7 fL (ref 80.0–100.0)
Platelets: 40 10*3/uL — ABNORMAL LOW (ref 150–400)
RBC: 2.3 MIL/uL — ABNORMAL LOW (ref 4.22–5.81)
RDW: 23.2 % — ABNORMAL HIGH (ref 11.5–15.5)
WBC: 4.1 10*3/uL (ref 4.0–10.5)
nRBC: 2 % — ABNORMAL HIGH (ref 0.0–0.2)

## 2021-08-08 LAB — PREPARE RBC (CROSSMATCH)

## 2021-08-08 LAB — LACTATE DEHYDROGENASE: LDH: 129 U/L (ref 98–192)

## 2021-08-08 MED ORDER — SODIUM CHLORIDE 0.9% IV SOLUTION: Freq: Once | INTRAVENOUS | Status: AC

## 2021-08-08 MED ORDER — ACETAMINOPHEN 325 MG PO TABS
650.0000 mg | ORAL_TABLET | Freq: Four times a day (QID) | ORAL | Status: DC | PRN
Start: 2021-08-08 — End: 2021-10-12

## 2021-08-08 MED ORDER — PANTOPRAZOLE SODIUM 40 MG PO TBEC: 40.0000 mg | DELAYED_RELEASE_TABLET | Freq: Every day | ORAL | 1 refills | Status: DC

## 2021-08-08 NOTE — Discharge Summary (Signed)
DISCHARGE SUMMARY  Dustin Tucker  MR#: [id number]  DOB:02-10-1932  Date of Admission: 08/06/2021 Date of Discharge: 08/08/2021  Attending Physician:Aundraya Dripps Hennie Duos, MD  Patient's PCP:Kim, Jeneen Rinks, MD  Consults: none   Disposition: D/C home    Follow-up Appts:  Follow-up Information     Jani Gravel, MD Follow up.   Specialty: Internal Medicine Contact information: [address] [place] [address] [phone]                 Tests Needing Follow-up: -recheck CBC needed in 3-5 days -outpatient workup for anemia is indicated - consider starting w/ Hematology referral - suspect patient needs a bone marrow biopsy  -A1c pending at time of d/c   Discharge Diagnoses: Symptomatic acute versus subacute macrocytic anemia Thrombocytopenia Hyperglycemia HTN PSVT -tachybradycardia syndrome    Initial presentation: 87yo with a history of HTN and PSVT who presented to the ED with 3 weeks of progressively worsening dyspnea on exertion.  He denied lower extremity swelling, chest pain, melena, or hematochezia.  No hematemesis.  He did report intermittent abdominal pain which seems to improve with meals.  He admitted to taking ibuprofen 200 mg daily for approximately 1 week.  In the ED he was found to have a hemoglobin of 6.5 with a baseline of 15 two years prior.  He was Hemoccult negative.  Hospital Course:  Symptomatic acute versus subacute macrocytic anemia History suggests gradually worsening anemia - no signif hx of ongoing high dose NSAID use - Hemoccult negative - denies melena or hematochezia - no hematemesis - transfused 3U PRBC total this admission - continue empiric Protonix for now - V61 and folic acid normal - iron not low - TIBC is low normal - ferritin 244 - LDH normal - Hgb remained stable post transfusion, w/o evidence of rapid decline - d/c home for outpt eval (likely to include a bone marrow bx/Heme consult) - discussed w/ patient, and  advised him to return to the ED should he develop evidence of an active GIB    Thrombocytopenia Platelet count 155 two years ago - ? MDS - platelet count holding stable during this hospital stay - outpt w/u    Hyperglycemia A1c pending at time of d/c    HTN Blood pressure stable during this hospital stay   PSVT -tachybradycardia syndrome Status post ablation 2020 -followed by Dr. Curt Bears -stable during this admission      Allergies as of 08/08/2021       Reactions   Tamsulosin Hcl Nausea Only   Aspirin Other (See Comments)   Gi upset, can tolerate baby aspirin        Medication List     STOP taking these medications    aspirin EC 81 MG tablet   ibuprofen 200 MG tablet Commonly known as: ADVIL       TAKE these medications    acetaminophen 325 MG tablet Commonly known as: TYLENOL Take 2 tablets (650 mg total) by mouth every 6 (six) hours as needed for mild pain (or Fever >/= 101).   albuterol 108 (90 Base) MCG/ACT inhaler Commonly known as: VENTOLIN HFA Inhale 2 puffs into the lungs every 6 (six) hours as needed.   cholecalciferol 1000 units tablet Commonly known as: VITAMIN D Take 1,000 Units by mouth daily.   cloNIDine 0.1 MG tablet Commonly known as: CATAPRES Take 0.1 mg by mouth daily.   hydrochlorothiazide 25 MG tablet Commonly known as: HYDRODIURIL Take 25 mg by mouth daily.  levocetirizine 5 MG tablet Commonly known as: XYZAL Take 5 mg by mouth daily.   lisinopril 40 MG tablet Commonly known as: ZESTRIL Take 40 mg by mouth daily.   pantoprazole 40 MG tablet Commonly known as: PROTONIX Take 1 tablet (40 mg total) by mouth daily. Start taking on: August 09, 2021        Day of Discharge BP (!) 118/52    Pulse 70    Temp 97.7 F (36.5 C) (Oral)    Resp 16    Ht _0  (1.753 m)    Wt 72.6 kg    SpO2 99%    BMI 23.63 kg/m   Physical Exam: General: No acute respiratory distress Lungs: Clear to auscultation bilaterally without wheezes  or crackles Cardiovascular: Regular rate and rhythm without murmur gallop or rub normal S1 and S2 Abdomen: Nontender, nondistended, soft, bowel sounds positive, no rebound, no ascites, no appreciable mass Extremities: No significant cyanosis, clubbing, or edema bilateral lower extremities  Basic Metabolic Panel: Recent Labs  Lab 08/06/21 1531 08/08/21 0712  NA 134* 139  K 4.0 4.1  CL 102 106  CO2 24 25  GLUCOSE 121* 104*  BUN 23 29*  CREATININE 1.16 1.22  CALCIUM 9.0 8.6*    Liver Function Tests: Recent Labs  Lab 08/08/21 0712  AST 14*  ALT 11  ALKPHOS 44  BILITOT 0.9  PROT 5.8*  ALBUMIN 3.4*    CBC: Recent Labs  Lab 08/06/21 1531 08/07/21 0525 08/07/21 0736 08/08/21 0712  WBC 5.1 3.4* 3.7* 4.1  HGB 6.4* 7.5* 7.6* 7.6*  HCT 19.0* 22.0* 22.7* 22.7*  MCV 113.1* 98.7 97.8 98.7  PLT 59* 43* 43* 40*    Recent Results (from the past 240 hour(s))  Resp Panel by RT-PCR (Flu A&B, Covid) Nasopharyngeal Swab     Status: None   Collection Time: 08/06/21  4:33 PM   Specimen: Nasopharyngeal Swab; Nasopharyngeal(NP) swabs in vial transport medium  Result Value Ref Range Status   SARS Coronavirus 2 by RT PCR NEGATIVE NEGATIVE Final    Comment: (NOTE) SARS-CoV-2 target nucleic acids are NOT DETECTED.  The SARS-CoV-2 RNA is generally detectable in upper respiratory specimens during the acute phase of infection. The lowest concentration of SARS-CoV-2 viral copies this assay can detect is 138 copies/mL. A negative result does not preclude SARS-Cov-2 infection and should not be used as the sole basis for treatment or other patient management decisions. A negative result may occur with  improper specimen collection/handling, submission of specimen other than nasopharyngeal swab, presence of viral mutation(s) within the areas targeted by this assay, and inadequate number of viral copies(<138 copies/mL). A negative result must be combined with clinical observations, patient  history, and epidemiological information. The expected result is Negative.  Fact Sheet for Patients:  EntrepreneurPulse.com.au  Fact Sheet for Healthcare Providers:  IncredibleEmployment.be  This test is no t yet approved or cleared by the [place] FDA and  has been authorized for detection and/or diagnosis of SARS-CoV-2 by FDA under an Emergency Use Authorization (EUA). This EUA will remain  in effect (meaning this test can be used) for the duration of the COVID-19 declaration under Section 564(b)(1) of the Act, 21 U.S.C.section 360bbb-3(b)(1), unless the authorization is terminated  or revoked sooner.       Influenza A by PCR NEGATIVE NEGATIVE Final   Influenza B by PCR NEGATIVE NEGATIVE Final    Comment: (NOTE) The Xpert Xpress SARS-CoV-2/FLU/RSV plus assay is intended as an aid in the  diagnosis of influenza from Nasopharyngeal swab specimens and should not be used as a sole basis for treatment. Nasal washings and aspirates are unacceptable for Xpert Xpress SARS-CoV-2/FLU/RSV testing.  Fact Sheet for Patients: EntrepreneurPulse.com.au  Fact Sheet for Healthcare Providers: IncredibleEmployment.be  This test is not yet approved or cleared by the [place] FDA and has been authorized for detection and/or diagnosis of SARS-CoV-2 by FDA under an Emergency Use Authorization (EUA). This EUA will remain in effect (meaning this test can be used) for the duration of the COVID-19 declaration under Section 564(b)(1) of the Act, 21 U.S.C. section 360bbb-3(b)(1), unless the authorization is terminated or revoked.  Performed at [facility], [address], [place], [place] [address]       Time spent in discharge (includes decision making & examination of pt): 35 minutes  08/08/2021, 11:37 AM   Cherene Altes, MD [facility] Office  [phone]

## 2021-08-08 NOTE — Discharge Instructions (Signed)
DO NOT TAKE ANY NSAID MEDICATIONS (aspirin, advil, motrin, aleve, naprosyn, etc) - use Tylenol/acetaminophen as needed for minor aches and pains.   It is very important that you see your Primary Care Provider to continue the evaluation of your anemia as an outpatient.

## 2021-08-08 NOTE — Progress Notes (Signed)
Pt discharged in stable condition after his 1 unit PRBC via wheelchair to the ED where he drove himself home in his private vehicle. Discharge instructions reviewed with pt. Pt verbalized understanding. Pt belongings sent with pt. VSS. PIV removed intact, w/o complications.

## 2021-08-09 LAB — BPAM RBC
Blood Product Expiration Date: 202302022359
Blood Product Expiration Date: 202302022359
Blood Product Expiration Date: 202302062359
ISSUE DATE / TIME: 202212291953
ISSUE DATE / TIME: 202212292342
ISSUE DATE / TIME: 202212310959
Unit Type and Rh: 5100
Unit Type and Rh: 5100
Unit Type and Rh: 5100

## 2021-08-09 LAB — TYPE AND SCREEN
ABO/RH(D): B POS
Antibody Screen: NEGATIVE
Unit division: 0
Unit division: 0
Unit division: 0

## 2021-08-28 ENCOUNTER — Encounter (HOSPITAL_COMMUNITY): Payer: Self-pay | Admitting: Hematology

## 2021-08-28 ENCOUNTER — Inpatient Hospital Stay (HOSPITAL_COMMUNITY): Payer: Medicare HMO

## 2021-08-28 ENCOUNTER — Inpatient Hospital Stay (HOSPITAL_COMMUNITY): Payer: Medicare HMO | Attending: Hematology | Admitting: Hematology

## 2021-08-28 ENCOUNTER — Other Ambulatory Visit: Payer: Self-pay

## 2021-08-28 VITALS — BP 149/68 | HR 100 | Temp 97.0°F | Resp 18 | Ht 69.0 in | Wt 158.8 lb

## 2021-08-28 DIAGNOSIS — D696 Thrombocytopenia, unspecified: Secondary | ICD-10-CM | POA: Insufficient documentation

## 2021-08-28 DIAGNOSIS — Z87891 Personal history of nicotine dependence: Secondary | ICD-10-CM | POA: Diagnosis not present

## 2021-08-28 DIAGNOSIS — Z809 Family history of malignant neoplasm, unspecified: Secondary | ICD-10-CM | POA: Diagnosis not present

## 2021-08-28 DIAGNOSIS — D539 Nutritional anemia, unspecified: Secondary | ICD-10-CM | POA: Diagnosis present

## 2021-08-28 DIAGNOSIS — R0602 Shortness of breath: Secondary | ICD-10-CM | POA: Insufficient documentation

## 2021-08-28 DIAGNOSIS — R519 Headache, unspecified: Secondary | ICD-10-CM | POA: Insufficient documentation

## 2021-08-28 DIAGNOSIS — D649 Anemia, unspecified: Secondary | ICD-10-CM

## 2021-08-28 DIAGNOSIS — Z800 Family history of malignant neoplasm of digestive organs: Secondary | ICD-10-CM | POA: Insufficient documentation

## 2021-08-28 DIAGNOSIS — R42 Dizziness and giddiness: Secondary | ICD-10-CM | POA: Diagnosis not present

## 2021-08-28 DIAGNOSIS — Z7982 Long term (current) use of aspirin: Secondary | ICD-10-CM | POA: Diagnosis not present

## 2021-08-28 LAB — CBC WITH DIFFERENTIAL/PLATELET
Abs Immature Granulocytes: 0.05 10*3/uL (ref 0.00–0.07)
Basophils Absolute: 0 10*3/uL (ref 0.0–0.1)
Basophils Relative: 0 %
Eosinophils Absolute: 0 10*3/uL (ref 0.0–0.5)
Eosinophils Relative: 0 %
HCT: 27.1 % — ABNORMAL LOW (ref 39.0–52.0)
Hemoglobin: 9.1 g/dL — ABNORMAL LOW (ref 13.0–17.0)
Immature Granulocytes: 1 %
Lymphocytes Relative: 48 %
Lymphs Abs: 2.4 10*3/uL (ref 0.7–4.0)
MCH: 32.5 pg (ref 26.0–34.0)
MCHC: 33.6 g/dL (ref 30.0–36.0)
MCV: 96.8 fL (ref 80.0–100.0)
Monocytes Absolute: 1.1 10*3/uL — ABNORMAL HIGH (ref 0.1–1.0)
Monocytes Relative: 22 %
Neutro Abs: 1.4 10*3/uL — ABNORMAL LOW (ref 1.7–7.7)
Neutrophils Relative %: 29 %
Platelets: 46 10*3/uL — ABNORMAL LOW (ref 150–400)
RBC: 2.8 MIL/uL — ABNORMAL LOW (ref 4.22–5.81)
RDW: 21 % — ABNORMAL HIGH (ref 11.5–15.5)
WBC: 5 10*3/uL (ref 4.0–10.5)
nRBC: 1 % — ABNORMAL HIGH (ref 0.0–0.2)

## 2021-08-28 LAB — HEPATITIS C ANTIBODY: HCV Ab: NONREACTIVE

## 2021-08-28 LAB — RETICULOCYTES
Immature Retic Fract: 35.1 % — ABNORMAL HIGH (ref 2.3–15.9)
RBC.: 2.78 MIL/uL — ABNORMAL LOW (ref 4.22–5.81)
Retic Count, Absolute: 22 10*3/uL (ref 19.0–186.0)
Retic Ct Pct: 0.8 % (ref 0.4–3.1)

## 2021-08-28 LAB — FOLATE: Folate: 17.5 ng/mL (ref >5.9)

## 2021-08-28 LAB — HEPATITIS B CORE ANTIBODY, TOTAL: Hep B Core Total Ab: NONREACTIVE

## 2021-08-28 LAB — LACTATE DEHYDROGENASE: LDH: 170 U/L (ref 98–192)

## 2021-08-28 LAB — HEPATITIS B SURFACE ANTIBODY,QUALITATIVE: Hep B S Ab: NONREACTIVE

## 2021-08-28 LAB — SAMPLE TO BLOOD BANK

## 2021-08-28 LAB — HEPATITIS B SURFACE ANTIGEN: Hepatitis B Surface Ag: NONREACTIVE

## 2021-08-28 NOTE — Patient Instructions (Addendum)
[place] at [facility] Discharge Instructions  You were seen and examined today by Dr. Delton Coombes. Dr. Delton Coombes is a hematologist, meaning that he specializes in blood conditions. Dr. Delton Coombes discussed your past medical history, family history of cancers/blood disorders, and the events that led to you being here today.  You were referred to Dr. Delton Coombes due to anemia and thrombocytopenia (low blood and platelets), this was noted when you were hospitalized.  Dr. Delton Coombes has recommended additional lab work today in an attempt to identify the cause of your anemia.  Dr. Delton Coombes has also recommended a Bone Marrow Biopsy. It is unclear why you are anemic. This will rule out any issues arising from within the bone marrow.  Follow-up as scheduled.   Thank you for choosing [place] at [facility] to provide your oncology and hematology care.  To afford each patient quality time with our provider, please arrive at least 15 minutes before your scheduled appointment time.   If you have a lab appointment with the [facility] please come in thru the Main Entrance and check in at the main information desk.  You need to re-schedule your appointment should you arrive 10 or more minutes late.  We strive to give you quality time with our providers, and arriving late affects you and other patients whose appointments are after yours.  Also, if you no show three or more times for appointments you may be dismissed from the clinic at the providers discretion.     Again, thank you for choosing [facility].  Our hope is that these requests will decrease the amount of time that you wait before being seen by our physicians.       _____________________________________________________________  Should you have questions after your visit to [facility], please contact our office at [phone] and follow the prompts.  Our office  hours are 8:00 a.m. and 4:30 p.m. Monday - Friday.  Please note that voicemails left after 4:00 p.m. may not be returned until the following business day.  We are closed weekends and major holidays.  You do have access to a nurse 24-7, just call the main number to the clinic [phone] and do not press any options, hold on the line and a nurse will answer the phone.    For prescription refill requests, have your pharmacy contact our office and allow 72 hours.    Due to Covid, you will need to wear a mask upon entering the hospital. If you do not have a mask, a mask will be given to you at the Main Entrance upon arrival. For doctor visits, patients may have 1 support person age 22 or older with them. For treatment visits, patients can not have anyone with them due to social distancing guidelines and our immunocompromised population.

## 2021-08-28 NOTE — Progress Notes (Signed)
° °[facility] °[address] °[place], [place] [address] ° ° °CLINIC:  °Medical Oncology/Hematology ° °Patient Care Team: °Kim, James, MD as PCP - General (Internal Medicine) °Koneswaran, Suresh A, MD (Inactive) as PCP - Cardiology (Cardiology) °Katragadda, Sreedhar, MD as Medical Oncologist (Hematology) ° °CHIEF COMPLAINTS/PURPOSE OF CONSULTATION:  °Evaluation of thrombocytopenia and anemia ° °HISTORY OF PRESENTING ILLNESS:  °Dustin Tucker 88 y.o. male is here because of evaluation of thrombocytopenia and anemia, at the request of [facility]. ° °Today he reports feeling good. He received 3 pints of blood after presenting to the ED on 07/27/2021; this was his first blood transfusion. He denies current bleeding and black stools. He denies prior history of thrombocytopenia or anemia. He denies unexpected weight loss, fevers, and weight loss. He reports his taste has changed, but he denies decreased appetite.   ° °He currently lives at home on his own and is able to do his typical home activities independently. 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 He denies chemical exposure. He quit smoking at 26 years old. His brother had colon cancer, another brother had stomach cancer, and 2 maternal uncles with unspecified cancers.  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  ° Hypertension   ° ° °SURGICAL HISTORY: °Past Surgical History:  °Procedure Laterality Date  ° CARPAL TUNNEL RELEASE Right   ° COLONOSCOPY N/A 09/19/2013  ° Procedure: COLONOSCOPY;  Surgeon: Najeeb U Rehman, MD;  Location: [facility];  Service: Endoscopy;  Laterality: N/A;  830  ° FINGER AMPUTATION Left   ° 4th and 5th  ° left eye blindness Left   ° SVT ABLATION N/A 09/21/2018  ° Procedure: SVT ABLATION;  Surgeon: Camnitz, Will Martin, MD;  Location: [facility] INVASIVE CV LAB;  Service: Cardiovascular;  Laterality: N/A;  ° ° °SOCIAL HISTORY: °Social  History  ° °Socioeconomic History  ° Marital status: Widowed  °  Spouse name: Not on file  ° Number of children: Not on file  ° Years of education: Not on file  ° Highest education level: Not on file  °Occupational History  ° Not on file  °Tobacco Use  ° Smoking status: Former  °  Packs/day: 0.50  °  Types: Cigarettes  °  Start date: 08/09/1956  °  Quit date: 08/10/1979  °  Years since quitting: 42.0  ° Smokeless tobacco: Never  °Vaping Use  ° Vaping Use: Never used  °Substance and Sexual Activity  ° Alcohol use: No  °  Alcohol/week: 0.0 standard drinks  ° Drug use: No  ° Sexual activity: Not on file  °Other Topics Concern  ° Not on file  °Social History Narrative  ° Not on file  ° °Social Determinants of Health  ° °Financial Resource Strain: Not on file  °Food Insecurity: Not on file  °Transportation Needs: Not on file  °Physical Activity: Not on file  °Stress: Not on file  °Social Connections: Not on file  °Intimate Partner Violence: Not on file  ° ° °FAMILY HISTORY: °Family History  °Problem Relation Age of Onset  ° CAD Mother   ° CAD Father   ° Cancer Brother   ° ° °ALLERGIES:  is allergic to tamsulosin hcl and aspirin. ° °MEDICATIONS:  °Current Outpatient Medications  °Medication Sig Dispense Refill  ° acetaminophen (TYLENOL) 325 MG tablet Take 2 tablets (650 mg total)   total) by mouth every 6 (six) hours as needed for mild pain (or Fever >/= 101).     albuterol (VENTOLIN HFA) 108 (90 Base) MCG/ACT inhaler Inhale 2 puffs into the lungs every 6 (six) hours as needed.     cholecalciferol (VITAMIN D) 1000 units tablet Take 1,000 Units by mouth daily.     cloNIDine (CATAPRES) 0.1 MG tablet Take 0.1 mg by mouth daily.     hydrochlorothiazide (HYDRODIURIL) 25 MG tablet Take 25 mg by mouth daily.      levocetirizine (XYZAL) 5 MG tablet Take 5 mg by mouth daily.     lisinopril (PRINIVIL,ZESTRIL) 40 MG tablet Take 40 mg by mouth daily.      pantoprazole (PROTONIX) 40 MG tablet Take 1 tablet (40 mg total) by mouth daily. 30  tablet 1   vitamin B-12 (CYANOCOBALAMIN) 1000 MCG tablet Take 1,000 mcg by mouth daily.     No current facility-administered medications for this visit.    REVIEW OF SYSTEMS:   Review of Systems  Constitutional:  Negative for appetite change, fatigue, fever and unexpected weight change.  HENT:   Negative for nosebleeds.   Respiratory:  Positive for shortness of breath. Negative for hemoptysis.   Gastrointestinal:  Negative for blood in stool.  Genitourinary:  Negative for hematuria.   Neurological:  Positive for dizziness and headaches.  Hematological:  Does not bruise/bleed easily.  All other systems reviewed and are negative.   PHYSICAL EXAMINATION: ECOG PERFORMANCE STATUS: 1 - Symptomatic but completely ambulatory  Vitals:   08/28/21 1205  BP: (!) 149/68  Pulse: 100  Resp: 18  Temp: (!) 97 F (36.1 C)  SpO2: 100%   Filed Weights   08/28/21 1205  Weight: 158 lb 12.8 oz (72 kg)   Physical Exam Vitals reviewed.  Constitutional:      Appearance: Normal appearance.  Cardiovascular:     Rate and Rhythm: Normal rate and regular rhythm.     Pulses: Normal pulses.     Heart sounds: Normal heart sounds.  Pulmonary:     Effort: Pulmonary effort is normal.     Breath sounds: Normal breath sounds.  Abdominal:     Palpations: Abdomen is soft. There is no hepatomegaly, splenomegaly or mass.     Tenderness: There is no abdominal tenderness.  Musculoskeletal:     Right lower leg: No edema.     Left lower leg: No edema.  Lymphadenopathy:     Cervical: No cervical adenopathy.     Right cervical: No superficial cervical adenopathy.    Left cervical: No superficial cervical adenopathy.     Upper Body:     Right upper body: No supraclavicular, axillary or pectoral adenopathy.     Left upper body: No supraclavicular, axillary or pectoral adenopathy.     Lower Body: No right inguinal adenopathy. No left inguinal adenopathy.  Neurological:     General: No focal deficit present.      Mental Status: He is alert and oriented to person, place, and time.  Psychiatric:        Mood and Affect: Mood normal.        Behavior: Behavior normal.     LABORATORY DATA:  I have reviewed the data as listed Recent Results (from the past 2160 hour(s))  Basic metabolic panel     Status: Abnormal   Collection Time: 08/06/21  3:31 PM  Result Value Ref Range   Sodium 134 (L) 135 - 145 mmol/L   Potassium 4.0 3.5 -  5.1 mmol/L  ° Chloride 102 98 - 111 mmol/L  ° CO2 24 22 - 32 mmol/L  ° Glucose, Bld 121 (H) 70 - 99 mg/dL  °  Comment: Glucose reference range applies only to samples taken after fasting for at least 8 hours.  ° BUN 23 8 - 23 mg/dL  ° Creatinine, Ser 1.16 0.61 - 1.24 mg/dL  ° Calcium 9.0 8.9 - 10.3 mg/dL  ° GFR, Estimated >60 >60 mL/min  °  Comment: (NOTE) °Calculated using the CKD-EPI Creatinine Equation (2021) °  ° Anion gap 8 5 - 15  °  Comment: Performed at [facility], [address] [place], [place] [address]  °CBC     Status: Abnormal  ° Collection Time: 08/06/21  3:31 PM  °Result Value Ref Range  ° WBC 5.1 4.0 - 10.5 K/uL  ° RBC 1.68 (L) 4.22 - 5.81 MIL/uL  ° Hemoglobin 6.4 (LL) 13.0 - 17.0 g/dL  °  Comment: This critical result has verified and been called to M ROBERTS RN by Kirstene Forsyth on 12 29 2022 at 1612, and has been read back.   ° HCT 19.0 (L) 39.0 - 52.0 %  ° MCV 113.1 (H) 80.0 - 100.0 fL  ° MCH 38.1 (H) 26.0 - 34.0 pg  ° MCHC 33.7 30.0 - 36.0 g/dL  ° RDW 17.1 (H) 11.5 - 15.5 %  ° Platelets 59 (L) 150 - 400 K/uL  °  Comment: SPECIMEN CHECKED FOR CLOTS °Immature Platelet Fraction may be °clinically indicated, consider °ordering this additional test °[id number] °REPEATED TO VERIFY °PLATELETS APPEAR DECREASED °  ° nRBC 2.9 (H) 0.0 - 0.2 %  °  Comment: Performed at [facility], [address] [place],  [address]  °Resp Panel by RT-PCR (Flu A&B, Covid) Nasopharyngeal Swab     Status: None  ° Collection Time: 08/06/21  4:33 PM  ° Specimen: Nasopharyngeal Swab;  Nasopharyngeal(NP) swabs in vial transport medium  °Result Value Ref Range  ° SARS Coronavirus 2 by RT PCR NEGATIVE NEGATIVE  °  Comment: (NOTE) °SARS-CoV-2 target nucleic acids are NOT DETECTED. ° °The SARS-CoV-2 RNA is generally detectable in upper respiratory °specimens during the acute phase of infection. The lowest °concentration of SARS-CoV-2 viral copies this assay can detect is °138 copies/mL. A negative result does not preclude SARS-Cov-2 °infection and should not be used as the sole basis for treatment or °other patient management decisions. A negative result may occur with  °improper specimen collection/handling, submission of specimen other °than nasopharyngeal swab, presence of viral mutation(s) within the °areas targeted by this assay, and inadequate number of viral °copies(<138 copies/mL). A negative result must be combined with °clinical observations, patient history, and epidemiological °information. The expected result is Negative. ° °Fact Sheet for Patients:  °https://www.fda.gov/media/152166/download ° °Fact Sheet for Healthcare Providers:  °https://www.fda.gov/media/152162/download ° °This test is no t yet approved or cleared by the [place] FDA and  °has been authorized for detection and/or diagnosis of SARS-CoV-2 by °FDA under an Emergency Use Authorization (EUA). This EUA will remain  °in effect (meaning this test can be used) for the duration of the °COVID-19 declaration under Section 564(b)(1) of the Act, 21 °U.S.C.section 360bbb-3(b)(1), unless the authorization is terminated  °or revoked sooner.  ° ° °  ° Influenza A by PCR NEGATIVE NEGATIVE  ° Influenza B by PCR NEGATIVE NEGATIVE  °  Comment: (NOTE) °The Xpert Xpress SARS-CoV-2/FLU/RSV plus assay is intended as an aid °in the diagnosis of influenza from Nasopharyngeal   swab specimens and °should not be used as a sole basis for treatment. Nasal washings and °aspirates are unacceptable for Xpert Xpress  SARS-CoV-2/FLU/RSV °testing. ° °Fact Sheet for Patients: °https://www.fda.gov/media/152166/download ° °Fact Sheet for Healthcare Providers: °https://www.fda.gov/media/152162/download ° °This test is not yet approved or cleared by the [place] FDA and °has been authorized for detection and/or diagnosis of SARS-CoV-2 by °FDA under an Emergency Use Authorization (EUA). This EUA will remain °in effect (meaning this test can be used) for the duration of the °COVID-19 declaration under Section 564(b)(1) of the Act, 21 U.S.C. °section 360bbb-3(b)(1), unless the authorization is terminated or °revoked. ° °Performed at [facility], [address] [place], [place] [address] °  °POC occult blood, ED Provider will collect     Status: Abnormal  ° Collection Time: 08/06/21  4:43 PM  °Result Value Ref Range  ° Stool Culture result 1 (CMPCXR) NEGATIVE (NE)   °Type and screen     Status: None  ° Collection Time: 08/06/21  6:37 PM  °Result Value Ref Range  ° ABO/RH(D) B POS   ° Antibody Screen NEG   ° Sample Expiration 08/09/2021,2359   ° Unit Number [id number]   ° Blood Component Type RED CELLS,LR   ° Unit division 00   ° Status of Unit ISSUED,FINAL   ° Transfusion Status OK TO TRANSFUSE   ° Crossmatch Result Compatible   ° Unit Number [id number]   ° Blood Component Type RED CELLS,LR   ° Unit division 00   ° Status of Unit ISSUED,FINAL   ° Transfusion Status OK TO TRANSFUSE   ° Crossmatch Result Compatible   ° Unit Number [id number]   ° Blood Component Type RED CELLS,LR   ° Unit division 00   ° Status of Unit ISSUED,FINAL   ° Transfusion Status OK TO TRANSFUSE   ° Crossmatch Result    °  Compatible °Performed at [facility], [address] [place], [place] [address] °  °BPAM RBC     Status: None  ° Collection Time: 08/06/21  6:37 PM  °Result Value Ref Range  ° ISSUE DATE / TIME 202212291953   ° Blood Product Unit Number [id number]   ° PRODUCT CODE E0382V00   ° Unit Type and Rh 5100   ° Blood Product Expiration  Date 202302022359   ° ISSUE DATE / TIME 202212292342   ° Blood Product Unit Number [id number]   ° PRODUCT CODE E0382V00   ° Unit Type and Rh 5100   ° Blood Product Expiration Date 202302022359   ° ISSUE DATE / TIME 202212310959   ° Blood Product Unit Number [id number]   ° PRODUCT CODE E0382V00   ° Unit Type and Rh 5100   ° Blood Product Expiration Date 202302062359   °Prepare RBC (crossmatch)     Status: None  ° Collection Time: 08/06/21  6:45 PM  °Result Value Ref Range  ° Order Confirmation    °  ORDER PROCESSED BY BLOOD BANK °Performed at [facility], [address] [place], [place] [address] °  °Vitamin B12     Status: None  ° Collection Time: 08/06/21  7:11 PM  °Result Value Ref Range  ° Vitamin B-12 609 180 - 914 pg/mL  °  Comment: (NOTE) °This assay is not validated for testing neonatal or °myeloproliferative syndrome specimens for Vitamin B12 levels. °Performed at [facility], [address] [place], [place] [address] °  °Folate     Status: None  ° Collection Time: 08/06/21    7:11 PM  Result Value Ref Range   Folate 16.6 >5.9 ng/mL    Comment: Performed at [facility], [address] [place], [place] [address]  Iron and TIBC     Status: None   Collection Time: 08/06/21  7:11 PM  Result Value Ref Range   Iron 104 45 - 182 ug/dL   TIBC 281 250 - 450 ug/dL   Saturation Ratios 37 17.9 - 39.5 %   UIBC 177 ug/dL    Comment: Performed at [facility], [address], [place], [place] [address]  Ferritin     Status: None   Collection Time: 08/06/21  7:11 PM  Result Value Ref Range   Ferritin 244 24 - 336 ng/mL    Comment: Performed at [facility], [address], [place], [place] [address]  Reticulocytes     Status: Abnormal   Collection Time: 08/06/21  7:11 PM  Result Value Ref Range   Retic Ct Pct 2.1 0.4 - 3.1 %   RBC. 1.44 (L) 4.22 - 5.81 MIL/uL   Retic Count, Absolute 30.1 19.0 - 186.0 K/uL   Immature Retic Fract 31.0 (H) 2.3 - 15.9 %    Comment: Performed at [facility],  [address], [place], [place] [address]  ABO/Rh     Status: None   Collection Time: 08/06/21  7:11 PM  Result Value Ref Range   ABO/RH(D)      B POS Performed at [facility], [address], [place], [place] [address]   CBC     Status: Abnormal   Collection Time: 08/07/21  5:25 AM  Result Value Ref Range   WBC 3.4 (L) 4.0 - 10.5 K/uL   RBC 2.23 (L) 4.22 - 5.81 MIL/uL   Hemoglobin 7.5 (L) 13.0 - 17.0 g/dL   HCT 22.0 (L) 39.0 - 52.0 %   MCV 98.7 80.0 - 100.0 fL   MCH 33.6 26.0 - 34.0 pg   MCHC 34.1 30.0 - 36.0 g/dL   RDW 23.3 (H) 11.5 - 15.5 %   Platelets 43 (L) 150 - 400 K/uL    Comment: Immature Platelet Fraction may be clinically indicated, consider ordering this additional test [id number]    nRBC 2.6 (H) 0.0 - 0.2 %    Comment: Performed at [facility], [address] [place], [place] [address]  CBC     Status: Abnormal   Collection Time: 08/07/21  7:36 AM  Result Value Ref Range   WBC 3.7 (L) 4.0 - 10.5 K/uL   RBC 2.32 (L) 4.22 - 5.81 MIL/uL   Hemoglobin 7.6 (L) 13.0 - 17.0 g/dL   HCT 22.7 (L) 39.0 - 52.0 %   MCV 97.8 80.0 - 100.0 fL   MCH 32.8 26.0 - 34.0 pg   MCHC 33.5 30.0 - 36.0 g/dL   RDW 23.2 (H) 11.5 - 15.5 %   Platelets 43 (L) 150 - 400 K/uL    Comment: SPECIMEN CHECKED FOR CLOTS CONSISTENT WITH PREVIOUS RESULT REPEATED TO VERIFY PLATELET COUNT CONFIRMED BY SMEAR    nRBC 2.7 (H) 0.0 - 0.2 %    Comment: Performed at [facility], [address], [place],  [address]  Comprehensive metabolic panel     Status: Abnormal   Collection Time: 08/08/21  7:12 AM  Result Value Ref Range   Sodium 139 135 - 145 mmol/L   Potassium 4.1 3.5 - 5.1 mmol/L   Chloride 106 98 - 111 mmol/L   CO2 25 22 - 32 mmol/L  Glucose, Bld 104 (H) 70 - 99 mg/dL    Comment: Glucose reference range applies only to samples taken after fasting for at least 8 hours.   BUN 29 (H) 8 - 23 mg/dL   Creatinine, Ser 1.22 0.61 - 1.24 mg/dL   Calcium 8.6 (L) 8.9 - 10.3 mg/dL   Total Protein 5.8  (L) 6.5 - 8.1 g/dL   Albumin 3.4 (L) 3.5 - 5.0 g/dL   AST 14 (L) 15 - 41 U/L   ALT 11 0 - 44 U/L   Alkaline Phosphatase 44 38 - 126 U/L   Total Bilirubin 0.9 0.3 - 1.2 mg/dL   GFR, Estimated 57 (L) >60 mL/min    Comment: (NOTE) Calculated using the CKD-EPI Creatinine Equation (2021)    Anion gap 8 5 - 15    Comment: Performed at [facility], [address] [place], [place] [address]  CBC     Status: Abnormal   Collection Time: 08/08/21  7:12 AM  Result Value Ref Range   WBC 4.1 4.0 - 10.5 K/uL   RBC 2.30 (L) 4.22 - 5.81 MIL/uL   Hemoglobin 7.6 (L) 13.0 - 17.0 g/dL   HCT 22.7 (L) 39.0 - 52.0 %   MCV 98.7 80.0 - 100.0 fL   MCH 33.0 26.0 - 34.0 pg   MCHC 33.5 30.0 - 36.0 g/dL   RDW 23.2 (H) 11.5 - 15.5 %   Platelets 40 (L) 150 - 400 K/uL    Comment: Immature Platelet Fraction may be clinically indicated, consider ordering this additional test [id number]    nRBC 2.0 (H) 0.0 - 0.2 %    Comment: Performed at [facility], [address], [place], [place] [address]  Lactate dehydrogenase     Status: None   Collection Time: 08/08/21  7:12 AM  Result Value Ref Range   LDH 129 98 - 192 U/L    Comment: Performed at [facility], [address] [place], [place] [address]  Hemoglobin A1c     Status: Abnormal   Collection Time: 08/08/21  7:12 AM  Result Value Ref Range   Hgb A1c MFr Bld 6.4 (H) 4.8 - 5.6 %    Comment: (NOTE) Pre diabetes:          5.7%-6.4%  Diabetes:              >6.4%  Glycemic control for   <7.0% adults with diabetes    Mean Plasma Glucose 136.98 mg/dL    Comment: Performed at [facility], [place] [address] [place], [place] [address]  Prepare RBC (crossmatch)     Status: None   Collection Time: 08/08/21  8:06 AM  Result Value Ref Range   Order Confirmation      ORDER PROCESSED BY BLOOD BANK Performed at [facility], [address] [place], [place] [address]     RADIOGRAPHIC STUDIES: I have personally reviewed the radiological images as listed and  agreed with the findings in the report. DG Chest 2 View  Result Date: 08/06/2021 CLINICAL DATA:  Worsening shortness of breath over the past 3 weeks. EXAM: CHEST - 2 VIEW COMPARISON:  Chest x-ray dated July 28, 2021. FINDINGS: The heart size and mediastinal contours are within normal limits. Both lungs are clear. Unchanged right nipple shadow. The visualized skeletal structures are unremarkable. IMPRESSION: No active cardiopulmonary disease. Electronically Signed   By: Titus Dubin M.D.   On: 08/06/2021 15:56    ASSESSMENT:  Severe macrocytic anemia and thrombocytopenia: -  CBC on 08/06/2021 with hemoglobin 6.4, MCV 113, PLT 59.  Hemoglobin 15.5 on 09/20/2018.  Platelet count on 09/20/1998 2155. °- He was on aspirin 81 mg at presentation.  He received 3 units PRBC.  Ferritin was 244 and percent saturation 37.  B12 was 609 and folic acid normal.  Creatinine was normal. °- He denies any bleeding per rectum or melena. °- Denies any B symptoms.  Reports altered taste. ° ° °Social/family history: °- Lives at home by himself.  Son lives in [place].  He worked as a supervisor in textiles.  No exposure to chemicals.  Non-smoker. °- Brother had colon cancer.  Another brother had?  Stomach cancer.  2 maternal uncles had cancer, type unknown to the patient. ° ° °PLAN:  °Severe macrocytic anemia and thrombocytopenia: °- We talked about the differential diagnosis including MDS/AML. °- We will check MMA, copper, SPEP, free light chains.  We will repeat his CBC today.  We will also check for hemolysis with reticulocyte count, LDH and haptoglobin. °- We will send a serum EPO level. °- We talked about bone marrow aspiration and biopsy to further work-up pancytopenia. °- We will arrange for bone marrow biopsy and see him back 2 weeks after the biopsy to discuss results. ° ° ° °All questions were answered. The patient knows to call the clinic with any problems, questions or concerns. ° °Sreedhar Katragadda, MD 08/28/21  12:53 PM  °[facility] °[phone] ° ° °I, Kirstyn Evans, am acting as a scribe for Dr. Sreedhar Katragadda. ° °I, Sreedhar Katragadda MD, have reviewed the above documentation for accuracy and completeness, and I agree with the above. °  ° °

## 2021-08-29 LAB — ERYTHROPOIETIN: Erythropoietin: 140.5 m[IU]/mL — ABNORMAL HIGH (ref 2.6–18.5)

## 2021-08-29 LAB — HAPTOGLOBIN: Haptoglobin: 93 mg/dL (ref 38–329)

## 2021-08-30 LAB — COPPER, SERUM: Copper: 118 ug/dL (ref 69–132)

## 2021-08-31 LAB — METHYLMALONIC ACID, SERUM: Methylmalonic Acid, Quantitative: 101 nmol/L (ref 0–378)

## 2021-08-31 LAB — KAPPA/LAMBDA LIGHT CHAINS
Kappa free light chain: 32.4 mg/L — ABNORMAL HIGH (ref 3.3–19.4)
Kappa, lambda light chain ratio: 1.55 (ref 0.26–1.65)
Lambda free light chains: 20.9 mg/L (ref 5.7–26.3)

## 2021-08-31 LAB — PROTEIN ELECTROPHORESIS, SERUM
A/G Ratio: 1.5 (ref 0.7–1.7)
Albumin ELP: 4.3 g/dL (ref 2.9–4.4)
Alpha-1-Globulin: 0.3 g/dL (ref 0.0–0.4)
Alpha-2-Globulin: 0.7 g/dL (ref 0.4–1.0)
Beta Globulin: 1 g/dL (ref 0.7–1.3)
Gamma Globulin: 1 g/dL (ref 0.4–1.8)
Globulin, Total: 2.9 g/dL (ref 2.2–3.9)
Total Protein ELP: 7.2 g/dL (ref 6.0–8.5)

## 2021-09-16 ENCOUNTER — Other Ambulatory Visit: Payer: Self-pay | Admitting: Radiology

## 2021-09-18 ENCOUNTER — Ambulatory Visit (HOSPITAL_COMMUNITY)
Admission: RE | Admit: 2021-09-18 | Discharge: 2021-09-18 | Disposition: A | Discharge status: Home / Self Care | Payer: Medicare HMO | Source: Ambulatory Visit | Attending: Hematology | Admitting: Hematology

## 2021-09-18 ENCOUNTER — Other Ambulatory Visit: Payer: Self-pay | Admitting: *Deleted

## 2021-09-18 ENCOUNTER — Other Ambulatory Visit (HOSPITAL_COMMUNITY): Payer: Self-pay | Admitting: Physician Assistant

## 2021-09-18 ENCOUNTER — Inpatient Hospital Stay: Payer: Medicare HMO | Attending: Hematology

## 2021-09-18 ENCOUNTER — Other Ambulatory Visit (HOSPITAL_COMMUNITY): Payer: Self-pay

## 2021-09-18 ENCOUNTER — Encounter (HOSPITAL_COMMUNITY): Payer: Self-pay

## 2021-09-18 ENCOUNTER — Other Ambulatory Visit: Payer: Self-pay

## 2021-09-18 DIAGNOSIS — I10 Essential (primary) hypertension: Secondary | ICD-10-CM | POA: Insufficient documentation

## 2021-09-18 DIAGNOSIS — M1990 Unspecified osteoarthritis, unspecified site: Secondary | ICD-10-CM | POA: Diagnosis not present

## 2021-09-18 DIAGNOSIS — D61818 Other pancytopenia: Secondary | ICD-10-CM | POA: Insufficient documentation

## 2021-09-18 DIAGNOSIS — H5462 Unqualified visual loss, left eye, normal vision right eye: Secondary | ICD-10-CM | POA: Insufficient documentation

## 2021-09-18 DIAGNOSIS — Z8249 Family history of ischemic heart disease and other diseases of the circulatory system: Secondary | ICD-10-CM | POA: Insufficient documentation

## 2021-09-18 DIAGNOSIS — C8330 Diffuse large B-cell lymphoma, unspecified site: Secondary | ICD-10-CM | POA: Diagnosis not present

## 2021-09-18 DIAGNOSIS — D649 Anemia, unspecified: Secondary | ICD-10-CM

## 2021-09-18 DIAGNOSIS — D539 Nutritional anemia, unspecified: Secondary | ICD-10-CM | POA: Diagnosis not present

## 2021-09-18 DIAGNOSIS — D696 Thrombocytopenia, unspecified: Secondary | ICD-10-CM | POA: Diagnosis present

## 2021-09-18 DIAGNOSIS — Q899 Congenital malformation, unspecified: Secondary | ICD-10-CM | POA: Insufficient documentation

## 2021-09-18 DIAGNOSIS — D72821 Monocytosis (symptomatic): Secondary | ICD-10-CM | POA: Diagnosis not present

## 2021-09-18 LAB — CBC WITH DIFFERENTIAL/PLATELET
Abs Immature Granulocytes: 0.17 10*3/uL — ABNORMAL HIGH (ref 0.00–0.07)
Basophils Absolute: 0 10*3/uL (ref 0.0–0.1)
Basophils Relative: 0 %
Eosinophils Absolute: 0 10*3/uL (ref 0.0–0.5)
Eosinophils Relative: 0 %
HCT: 18.6 % — ABNORMAL LOW (ref 39.0–52.0)
Hemoglobin: 6.2 g/dL — CL (ref 13.0–17.0)
Immature Granulocytes: 2 %
Lymphocytes Relative: 43 %
Lymphs Abs: 3.5 10*3/uL (ref 0.7–4.0)
MCH: 32.1 pg (ref 26.0–34.0)
MCHC: 33.3 g/dL (ref 30.0–36.0)
MCV: 96.4 fL (ref 80.0–100.0)
Monocytes Absolute: 2.8 10*3/uL — ABNORMAL HIGH (ref 0.1–1.0)
Monocytes Relative: 35 %
Neutro Abs: 1.6 10*3/uL — ABNORMAL LOW (ref 1.7–7.7)
Neutrophils Relative %: 20 %
Platelets: 48 10*3/uL — ABNORMAL LOW (ref 150–400)
RBC: 1.93 MIL/uL — ABNORMAL LOW (ref 4.22–5.81)
RDW: 23.2 % — ABNORMAL HIGH (ref 11.5–15.5)
WBC: 8.1 10*3/uL (ref 4.0–10.5)
nRBC: 1.5 % — ABNORMAL HIGH (ref 0.0–0.2)

## 2021-09-18 LAB — PREPARE RBC (CROSSMATCH)

## 2021-09-18 MED ORDER — SODIUM CHLORIDE 0.9% IV SOLUTION: Freq: Once | INTRAVENOUS | Status: DC

## 2021-09-18 MED ORDER — FLUMAZENIL 0.5 MG/5ML IV SOLN
INTRAVENOUS | Status: AC
  Filled 2021-09-18: qty 5

## 2021-09-18 MED ORDER — SODIUM CHLORIDE 0.9% IV SOLUTION
250.0000 mL | Freq: Once | INTRAVENOUS | Status: AC
  Administered 2021-09-18: 250 mL via INTRAVENOUS

## 2021-09-18 MED ORDER — ACETAMINOPHEN 325 MG PO TABS
650.0000 mg | ORAL_TABLET | Freq: Once | ORAL | Status: AC
  Administered 2021-09-18: 650 mg via ORAL
  Filled 2021-09-18: qty 2

## 2021-09-18 MED ORDER — FENTANYL CITRATE (PF) 100 MCG/2ML IJ SOLN
INTRAMUSCULAR | Status: AC | PRN
  Administered 2021-09-18: 50 ug via INTRAVENOUS

## 2021-09-18 MED ORDER — DIPHENHYDRAMINE HCL 25 MG PO CAPS
25.0000 mg | ORAL_CAPSULE | Freq: Once | ORAL | Status: AC
  Administered 2021-09-18: 25 mg via ORAL
  Filled 2021-09-18: qty 1

## 2021-09-18 MED ORDER — MIDAZOLAM HCL 2 MG/2ML IJ SOLN
INTRAMUSCULAR | Status: AC
  Filled 2021-09-18: qty 2

## 2021-09-18 MED ORDER — SODIUM CHLORIDE 0.9 % IV SOLN: INTRAVENOUS | Status: DC

## 2021-09-18 MED ORDER — NALOXONE HCL 0.4 MG/ML IJ SOLN
INTRAMUSCULAR | Status: AC
  Filled 2021-09-18: qty 1

## 2021-09-18 MED ORDER — MIDAZOLAM HCL 2 MG/2ML IJ SOLN
INTRAMUSCULAR | Status: AC | PRN
  Administered 2021-09-18: 1 mg via INTRAVENOUS

## 2021-09-18 MED ORDER — FENTANYL CITRATE (PF) 100 MCG/2ML IJ SOLN
INTRAMUSCULAR | Status: AC
  Filled 2021-09-18: qty 2

## 2021-09-18 NOTE — Patient Instructions (Signed)
Blood Transfusion, Adult °A blood transfusion is a procedure in which you receive blood through an IV tube. You may need this procedure because of: °A bleeding disorder. °An illness. °An injury. °A surgery. °The blood may come from someone else (a donor). You may also be able to donate blood for yourself. The blood given in a transfusion is made up of different types of cells. You may get: °Red blood cells. These carry oxygen to the cells in the body. °White blood cells. These help you fight infections. °Platelets. These help your blood to clot. °Plasma. This is the liquid part of your blood. It carries proteins and other substances through the body. °If you have a clotting disorder, you may also get other types of blood products. °Tell your doctor about: °Any blood disorders you have. °Any reactions you have had during a blood transfusion in the past. °Any allergies you have. °All medicines you are taking, including vitamins, herbs, eye drops, creams, and over-the-counter medicines. °Any surgeries you have had. °Any medical conditions you have. This includes any recent fever or cold symptoms. °Whether you are pregnant or may be pregnant. °What are the risks? °Generally, this is a safe procedure. However, problems may occur. °The most common problems include: °A mild allergic reaction. This includes red, swollen areas of skin (hives) and itching. °Fever or chills. This may be the body's response to new blood cells received. This may happen during or up to 4 hours after the transfusion. °More serious problems may include: °Too much fluid in the lungs. This may cause breathing problems. °A serious allergic reaction. This includes breathing trouble or swelling around the face and lips. °Lung injury. This causes breathing trouble and low oxygen in the blood. This can happen within hours of the transfusion or days later. °Too much iron. This can happen after getting many blood transfusions over a period of time. °An  infection or virus passed through the blood. This is rare. Donated blood is carefully tested before it is given. °Your body's defense system (immune system) trying to attack the new blood cells. This is rare. Symptoms may include fever, chills, nausea, low blood pressure, and low back or chest pain. °Donated cells attacking healthy tissues. This is rare. °What happens before the procedure? °Medicines °Ask your doctor about: °Changing or stopping your normal medicines. This is important. °Taking aspirin and ibuprofen. Do not take these medicines unless your doctor tells you to take them. °Taking over-the-counter medicines, vitamins, herbs, and supplements. °General instructions °Follow instructions from your doctor about what you cannot eat or drink. °You will have a blood test to find out your blood type. The test also finds out what type of blood your body will accept and matches it to the donor type. °If you are going to have a planned surgery, you may be able to donate your own blood. This may be done in case you need a transfusion. °You will have your temperature, blood pressure, and pulse checked. °You may receive medicine to help prevent an allergic reaction. This may be done if you have had a reaction to a transfusion before. This medicine may be given to you by mouth or through an IV tube. °This procedure lasts about 1-4 hours. Plan for the time you need. °What happens during the procedure? ° °An IV tube will be put into one of your veins. °The bag of donated blood will be attached to your IV tube. Then, the blood will enter through your vein. °Your temperature,   blood pressure, and pulse will be checked often. This is done to find early signs of a transfusion reaction. °Tell your nurse right away if you have any of these symptoms: °Shortness of breath or trouble breathing. °Chest or back pain. °Fever or chills. °Red, swollen areas of skin or itching. °If you have any signs or symptoms of a reaction, your  transfusion will be stopped. You may also be given medicine. °When the transfusion is finished, your IV tube will be taken out. °Pressure may be put on the IV site for a few minutes. °A bandage (dressing) will be put on the IV site. °The procedure may vary among doctors and hospitals. °What happens after the procedure? °You will be monitored until you leave the hospital or clinic. This includes checking your temperature, blood pressure, pulse, breathing rate, and blood oxygen level. °Your blood may be tested to see how you are responding to the transfusion. °You may be warmed with fluids or blankets. This is done to keep the temperature of your body normal. °If you have your procedure in an outpatient setting, you will be told whom to contact to report any reactions. °Where to find more information °To learn more, visit the [employer]: redcross.org °Summary °A blood transfusion is a procedure in which you are given blood through an IV tube. °The blood may come from someone else (a donor). You may also be able to donate blood for yourself. °The blood you are given is made up of different blood cells. You may receive red blood cells, platelets, plasma, or white blood cells. °Your temperature, blood pressure, and pulse will be checked often. °After the procedure, your blood may be tested to see how you are responding. °This information is not intended to replace advice given to you by your health care provider. Make sure you discuss any questions you have with your health care provider. °Document Revised: 01/18/2019 Document Reviewed: 01/18/2019 °Elsevier Patient Education © 2022 [employer] ° °

## 2021-09-18 NOTE — Discharge Instructions (Signed)
Please call Interventional Radiology clinic [phone] with any questions or concerns. ° °You may remove your dressing and shower tomorrow. ° ° °Bone Marrow Aspiration and Bone Marrow Biopsy, Adult, Care After °This sheet gives you information about how to care for yourself after your procedure. Your health care provider may also give you more specific instructions. If you have problems or questions, contact your health care provider. °What can I expect after the procedure? °After the procedure, it is common to have: °Mild pain and tenderness. °Swelling. °Bruising. °Follow these instructions at home: °Puncture site care °Follow instructions from your health care provider about how to take care of the puncture site. Make sure you: °Wash your hands with soap and water before and after you change your bandage (dressing). If soap and water are not available, use hand sanitizer. °Change your dressing as told by your health care provider. °Check your puncture site every day for signs of infection. Check for: °More redness, swelling, or pain. °Fluid or blood. °Warmth. °Pus or a bad smell.   °Activity °Return to your normal activities as told by your health care provider. Ask your health care provider what activities are safe for you. °Do not lift anything that is heavier than 10 lb (4.5 kg), or the limit that you are told, until your health care provider says that it is safe. °Do not drive for 24 hours if you were given a sedative during your procedure. °General instructions °Take over-the-counter and prescription medicines only as told by your health care provider. °Do not take baths, swim, or use a hot tub until your health care provider approves. Ask your health care provider if you may take showers. You may only be allowed to take sponge baths. °If directed, put ice on the affected area. To do this: °Put ice in a plastic bag. °Place a towel between your skin and the bag. °Leave the ice on for 20 minutes, 2-3 times a  day. °Keep all follow-up visits as told by your health care provider. This is important.   °Contact a health care provider if: °Your pain is not controlled with medicine. °You have a fever. °You have more redness, swelling, or pain around the puncture site. °You have fluid or blood coming from the puncture site. °Your puncture site feels warm to the touch. °You have pus or a bad smell coming from the puncture site. °Summary °After the procedure, it is common to have mild pain, tenderness, swelling, and bruising. °Follow instructions from your health care provider about how to take care of the puncture site and what activities are safe for you. °Take over-the-counter and prescription medicines only as told by your health care provider. °Contact a health care provider if you have any signs of infection, such as fluid or blood coming from the puncture site. °This information is not intended to replace advice given to you by your health care provider. Make sure you discuss any questions you have with your health care provider. °Document Revised: 12/12/2018 Document Reviewed: 12/12/2018 °Elsevier Patient Education © 2021 [employer] Inc. ° ° °Moderate Conscious Sedation, Adult, Care After °This sheet gives you information about how to care for yourself after your procedure. Your health care provider may also give you more specific instructions. If you have problems or questions, contact your health care provider. °What can I expect after the procedure? °After the procedure, it is common to have: °Sleepiness for several hours. °Impaired judgment for several hours. °Difficulty with balance. °Vomiting if you eat too   soon. °Follow these instructions at home: °For the time period you were told by your health care provider: °Rest. °Do not participate in activities where you could fall or become injured. °Do not drive or use machinery. °Do not drink alcohol. °Do not take sleeping pills or medicines that cause drowsiness. °Do not  make important decisions or sign legal documents. °Do not take care of children on your own.  °  °  °Eating and drinking °Follow the diet recommended by your health care provider. °Drink enough fluid to keep your urine pale yellow. °If you vomit: °Drink water, juice, or soup when you can drink without vomiting. °Make sure you have little or no nausea before eating solid foods.   °General instructions °Take over-the-counter and prescription medicines only as told by your health care provider. °Have a responsible adult stay with you for the time you are told. It is important to have someone help care for you until you are awake and alert. °Do not smoke. °Keep all follow-up visits as told by your health care provider. This is important. °Contact a health care provider if: °You are still sleepy or having trouble with balance after 24 hours. °You feel light-headed. °You keep feeling nauseous or you keep vomiting. °You develop a rash. °You have a fever. °You have redness or swelling around the IV site. °Get help right away if: °You have trouble breathing. °You have new-onset confusion at home. °Summary °After the procedure, it is common to feel sleepy, have impaired judgment, or feel nauseous if you eat too soon. °Rest after you get home. Know the things you should not do after the procedure. °Follow the diet recommended by your health care provider and drink enough fluid to keep your urine pale yellow. °Get help right away if you have trouble breathing or new-onset confusion at home. °This information is not intended to replace advice given to you by your health care provider. Make sure you discuss any questions you have with your health care provider. °Document Revised: 11/23/2019 Document Reviewed: 06/21/2019 °Elsevier Patient Education © 2021 [employer] Inc.  °

## 2021-09-18 NOTE — Procedures (Signed)
Interventional Radiology Procedure Note  Procedure: CT guided bone marrow biopsy  Indication: Severe macrocytic anemia and thrombocytopenia of uncertain etiology  Findings: Please refer to procedural dictation for full description.  Complications: None  EBL: < 10 mL  Miachel Roux, MD [phone]

## 2021-09-18 NOTE — Consult Note (Signed)
Chief Complaint: Patient was seen in consultation today for  CT guided bone marrow biopsy    Referring Physician(s): Katragadda,Sreedhar  Supervising Physician: Mir, Sharen Heck  Patient Status: [facility] - Out-pt  History of Present Illness: Dustin Tucker is an 88 y.o. male with past medical history of arthritis, blindness left eye, hypertension who presents with severe macrocytic anemia and thrombocytopenia of uncertain etiology.  He is scheduled today for CT-guided bone marrow biopsy for further evaluation.  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   Hypertension     Past Surgical History:  Procedure Laterality Date   CARPAL TUNNEL RELEASE Right    COLONOSCOPY N/A 09/19/2013   Procedure: COLONOSCOPY;  Surgeon: Rogene Houston, MD;  Location: [facility];  Service: Endoscopy;  Laterality: N/A;  830   FINGER AMPUTATION Left    4th and 5th   left eye blindness Left    SVT ABLATION N/A 09/21/2018   Procedure: SVT ABLATION;  Surgeon: Constance Haw, MD;  Location: [facility];  Service: Cardiovascular;  Laterality: N/A;    Allergies: Tamsulosin hcl and Aspirin  Medications: Prior to Admission medications   Medication Sig Start Date End Date Taking? Authorizing Provider  acetaminophen (TYLENOL) 325 MG tablet Take 2 tablets (650 mg total) by mouth every 6 (six) hours as needed for mild pain (or Fever >/= 101). 08/08/21  Yes Cherene Altes, MD  albuterol (VENTOLIN HFA) 108 (90 Base) MCG/ACT inhaler Inhale 2 puffs into the lungs every 6 (six) hours as needed. 07/28/21  Yes [provider]  cholecalciferol (VITAMIN D) 1000 units tablet Take 1,000 Units by mouth daily.   Yes [provider]  cloNIDine (CATAPRES) 0.1 MG tablet Take 0.1 mg by mouth daily. 05/16/18  Yes [provider]  hydrochlorothiazide (HYDRODIURIL) 25 MG tablet Take 25 mg by mouth daily.  06/25/16  Yes [provider]  levocetirizine (XYZAL) 5 MG tablet Take 5 mg by mouth daily. 07/28/21  Yes [provider]  lisinopril (PRINIVIL,ZESTRIL) 40 MG tablet Take 40 mg by mouth daily.  06/09/16  Yes [provider]  pantoprazole (PROTONIX) 40 MG tablet Take 1 tablet (40 mg total) by mouth daily. 08/09/21  Yes Cherene Altes, MD  vitamin B-12 (CYANOCOBALAMIN) 1000 MCG tablet Take 1,000 mcg by mouth daily.   Yes [provider]     Family History  Problem Relation Age of Onset   CAD Mother    CAD Father    Cancer Brother     Social History   Socioeconomic History   Marital status: Widowed    Spouse name: Not on file   Number of children: Not on file   Years of education: Not on file   Highest education level: Not on file  Occupational History   Not on file  Tobacco Use   Smoking status: Former    Packs/day: 0.50    Types: Cigarettes    Start date: 08/09/1956    Quit date: 08/10/1979    Years since quitting: 42.1   Smokeless tobacco: Never  Vaping Use   Vaping Use: Never used  Substance and Sexual Activity   Alcohol use: No    Alcohol/week: 0.0 standard drinks   Drug use: No   Sexual activity: Not on file  Other Topics Concern   Not on file  Social History Narrative   Not on  file   Social Determinants of Health   Financial Resource Strain: Not on file  Food Insecurity: Not on file  Transportation Needs: Not on file  Physical Activity: Not on file  Stress: Not on file  Social Connections: Not on file      Review of Systems denies fever,HA,CP,dyspnea, cough, abd pain, back pain,N/V or bleeding  Vital Signs: BP (!) 161/71 (BP Location: Right Arm)    Pulse 88    Temp 97.7 F (36.5 C) (Oral)    Resp 15    SpO2 100%   Physical Exam awake, alert.  Chest clear to auscultation bilaterally.  Heart with normal rate, occasional ectopy.  Abdomen soft, positive bowel sounds, nontender.  No lower extremity edema.  Imaging: No results  found.  Labs:  CBC: Recent Labs    08/07/21 0525 08/07/21 0736 08/08/21 0712 08/28/21 1257  WBC 3.4* 3.7* 4.1 5.0  HGB 7.5* 7.6* 7.6* 9.1*  HCT 22.0* 22.7* 22.7* 27.1*  PLT 43* 43* 40* 46*    COAGS: No results for input(s): INR, APTT in the last 8760 hours.  BMP: Recent Labs    08/06/21 1531 08/08/21 0712  NA 134* 139  K 4.0 4.1  CL 102 106  CO2 24 25  GLUCOSE 121* 104*  BUN 23 29*  CALCIUM 9.0 8.6*  CREATININE 1.16 1.22  GFRNONAA >60 57*    LIVER FUNCTION TESTS: Recent Labs    08/08/21 0712  BILITOT 0.9  AST 14*  ALT 11  ALKPHOS 44  PROT 5.8*  ALBUMIN 3.4*    TUMOR MARKERS: No results for input(s): AFPTM, CEA, CA199, CHROMGRNA in the last 8760 hours.  Assessment and Plan: 88 y.o. male with past medical history of arthritis, blindness left eye, hypertension who presents with severe macrocytic anemia and thrombocytopenia of uncertain etiology.  He is scheduled today for CT-guided bone marrow biopsy for further evaluation.Risks and benefits of procedure was discussed with the patient  including, but not limited to bleeding, infection, damage to adjacent structures or low yield requiring additional tests.  All of the questions were answered and there is agreement to proceed.  Consent signed and in chart.  Pt's hgb today is 6.4; Dr. Tomie China office notified; they would like 1 unit of PRBC'S given today prior to d/c home; they will f/u with pt on 2/13 in office  Thank you for this interesting consult.  I greatly enjoyed meeting Dustin Tucker and look forward to participating in their care.  A copy of this report was sent to the requesting provider on this date.  Electronically Signed: D. Rowe Robert, PA-C 09/18/2021, 8:14 AM   I spent a total of   20 minutes  in face to face in clinical consultation, greater than 50% of which was counseling/coordinating care for CT-guided bone marrow biopsy

## 2021-09-18 NOTE — Progress Notes (Signed)
Date and time results received: 09/18/21 0815  (use smartphrase ".now" to insert current time)  Test: Hgb Critical Value: 6.2  Name of Provider Notified: Rowe Robert, PA  Orders Received? Or Actions Taken?:  No orders given

## 2021-09-18 NOTE — Progress Notes (Signed)
Orders placed for 1 unit PRBCs today to be given at [facility] per Tarri Abernethy, PA. Orders placed under sign and held.

## 2021-09-21 ENCOUNTER — Inpatient Hospital Stay (HOSPITAL_COMMUNITY): Payer: Medicare HMO | Attending: Hematology

## 2021-09-21 DIAGNOSIS — Z87891 Personal history of nicotine dependence: Secondary | ICD-10-CM | POA: Insufficient documentation

## 2021-09-21 DIAGNOSIS — I10 Essential (primary) hypertension: Secondary | ICD-10-CM | POA: Diagnosis not present

## 2021-09-21 DIAGNOSIS — C9310 Chronic myelomonocytic leukemia not having achieved remission: Secondary | ICD-10-CM | POA: Insufficient documentation

## 2021-09-21 DIAGNOSIS — D649 Anemia, unspecified: Secondary | ICD-10-CM

## 2021-09-21 DIAGNOSIS — Z809 Family history of malignant neoplasm, unspecified: Secondary | ICD-10-CM | POA: Insufficient documentation

## 2021-09-21 LAB — CBC WITH DIFFERENTIAL/PLATELET
Abs Immature Granulocytes: 0.15 10*3/uL — ABNORMAL HIGH (ref 0.00–0.07)
Basophils Absolute: 0 10*3/uL (ref 0.0–0.1)
Basophils Relative: 1 %
Eosinophils Absolute: 0 10*3/uL (ref 0.0–0.5)
Eosinophils Relative: 0 %
HCT: 22.1 % — ABNORMAL LOW (ref 39.0–52.0)
Hemoglobin: 7.4 g/dL — ABNORMAL LOW (ref 13.0–17.0)
Immature Granulocytes: 3 %
Lymphocytes Relative: 46 %
Lymphs Abs: 2.8 10*3/uL (ref 0.7–4.0)
MCH: 31.9 pg (ref 26.0–34.0)
MCHC: 33.5 g/dL (ref 30.0–36.0)
MCV: 95.3 fL (ref 80.0–100.0)
Monocytes Absolute: 1.9 10*3/uL — ABNORMAL HIGH (ref 0.1–1.0)
Monocytes Relative: 31 %
Neutro Abs: 1.2 10*3/uL — ABNORMAL LOW (ref 1.7–7.7)
Neutrophils Relative %: 19 %
Platelets: 40 10*3/uL — ABNORMAL LOW (ref 150–400)
RBC: 2.32 MIL/uL — ABNORMAL LOW (ref 4.22–5.81)
RDW: 23.4 % — ABNORMAL HIGH (ref 11.5–15.5)
WBC: 6 10*3/uL (ref 4.0–10.5)
nRBC: 1.3 % — ABNORMAL HIGH (ref 0.0–0.2)

## 2021-09-21 LAB — TYPE AND SCREEN
ABO/RH(D): B POS
Antibody Screen: NEGATIVE
Unit division: 0

## 2021-09-21 LAB — BPAM RBC
Blood Product Expiration Date: 202302222359
ISSUE DATE / TIME: 202302101410
Unit Type and Rh: 7300

## 2021-09-21 LAB — SAMPLE TO BLOOD BANK

## 2021-09-22 ENCOUNTER — Inpatient Hospital Stay (HOSPITAL_COMMUNITY): Payer: Medicare HMO

## 2021-09-22 LAB — PREPARE RBC (CROSSMATCH)

## 2021-09-23 LAB — SURGICAL PATHOLOGY

## 2021-09-24 LAB — TYPE AND SCREEN
ABO/RH(D): B POS
Antibody Screen: NEGATIVE
Unit division: 0

## 2021-09-24 LAB — BPAM RBC
Blood Product Expiration Date: 202303082359
Unit Type and Rh: 1700

## 2021-09-25 ENCOUNTER — Encounter (HOSPITAL_COMMUNITY): Payer: Self-pay | Admitting: Hematology

## 2021-09-28 ENCOUNTER — Encounter (HOSPITAL_COMMUNITY): Payer: Self-pay | Admitting: Hematology

## 2021-09-28 ENCOUNTER — Ambulatory Visit (HOSPITAL_COMMUNITY): Payer: Medicare HMO | Admitting: Hematology

## 2021-10-01 ENCOUNTER — Inpatient Hospital Stay (HOSPITAL_COMMUNITY): Payer: Medicare HMO | Admitting: Hematology

## 2021-10-01 ENCOUNTER — Other Ambulatory Visit: Payer: Self-pay

## 2021-10-01 ENCOUNTER — Inpatient Hospital Stay (HOSPITAL_COMMUNITY): Payer: Medicare HMO

## 2021-10-01 VITALS — BP 122/69 | HR 103 | Temp 98.4°F | Resp 18 | Ht 66.54 in | Wt 162.0 lb

## 2021-10-01 DIAGNOSIS — C9310 Chronic myelomonocytic leukemia not having achieved remission: Secondary | ICD-10-CM

## 2021-10-01 DIAGNOSIS — D469 Myelodysplastic syndrome, unspecified: Secondary | ICD-10-CM | POA: Diagnosis not present

## 2021-10-01 LAB — CBC WITH DIFFERENTIAL/PLATELET
Basophils Absolute: 0 10*3/uL (ref 0.0–0.1)
Basophils Relative: 0 %
Eosinophils Absolute: 0 10*3/uL (ref 0.0–0.5)
Eosinophils Relative: 0 %
HCT: 19.7 % — ABNORMAL LOW (ref 39.0–52.0)
Hemoglobin: 6.6 g/dL — CL (ref 13.0–17.0)
Lymphocytes Relative: 44 %
Lymphs Abs: 4 10*3/uL (ref 0.7–4.0)
MCH: 32 pg (ref 26.0–34.0)
MCHC: 33.5 g/dL (ref 30.0–36.0)
MCV: 95.6 fL (ref 80.0–100.0)
Monocytes Absolute: 2.9 10*3/uL — ABNORMAL HIGH (ref 0.1–1.0)
Monocytes Relative: 31 %
Neutro Abs: 2.3 10*3/uL (ref 1.7–7.7)
Neutrophils Relative %: 25 %
Platelets: 50 10*3/uL — ABNORMAL LOW (ref 150–400)
RBC: 2.06 MIL/uL — ABNORMAL LOW (ref 4.22–5.81)
RDW: 23.3 % — ABNORMAL HIGH (ref 11.5–15.5)
WBC: 9.2 10*3/uL (ref 4.0–10.5)
nRBC: 0.8 % — ABNORMAL HIGH (ref 0.0–0.2)

## 2021-10-01 LAB — SAMPLE TO BLOOD BANK

## 2021-10-01 LAB — COMPREHENSIVE METABOLIC PANEL
ALT: 14 U/L (ref 0–44)
AST: 17 U/L (ref 15–41)
Albumin: 4.3 g/dL (ref 3.5–5.0)
Alkaline Phosphatase: 46 U/L (ref 38–126)
Anion gap: 8 (ref 5–15)
BUN: 34 mg/dL — ABNORMAL HIGH (ref 8–23)
CO2: 24 mmol/L (ref 22–32)
Calcium: 9.1 mg/dL (ref 8.9–10.3)
Chloride: 105 mmol/L (ref 98–111)
Creatinine, Ser: 1.49 mg/dL — ABNORMAL HIGH (ref 0.61–1.24)
GFR, Estimated: 45 mL/min — ABNORMAL LOW (ref >60)
Glucose, Bld: 111 mg/dL — ABNORMAL HIGH (ref 70–99)
Potassium: 4.2 mmol/L (ref 3.5–5.1)
Sodium: 137 mmol/L (ref 135–145)
Total Bilirubin: 0.9 mg/dL (ref 0.3–1.2)
Total Protein: 7.2 g/dL (ref 6.5–8.1)

## 2021-10-01 LAB — PREPARE RBC (CROSSMATCH)

## 2021-10-01 NOTE — Progress Notes (Signed)
[place], [place] [address]   CLINIC:  Medical Oncology/Hematology  PCP:  Jani Gravel, MD [place] / [place] [address]  [phone]  REASON FOR VISIT:  Follow-up for MDS/CMML  PRIOR THERAPY: none  CURRENT THERAPY: under work-up  INTERVAL HISTORY:  [name], a 88 y.o. 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 Dustin Tucker was last seen on 08/28/2021.  Today he reports feeling fair. He reports pain and numbness in his legs bilaterally. He reports occasional fatigue.   REVIEW OF SYSTEMS:  Review of Systems  Constitutional:  Positive for fatigue. Negative for appetite change.  Respiratory:  Positive for shortness of breath.   Musculoskeletal:  Positive for arthralgias (4/10 legs).  Neurological:  Positive for numbness (legs).  Psychiatric/Behavioral:  Positive for sleep disturbance.   All other systems reviewed and are negative.  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   Hypertension    Past Surgical History:  Procedure Laterality Date   CARPAL TUNNEL RELEASE Right    COLONOSCOPY N/A 09/19/2013   Procedure: COLONOSCOPY;  Surgeon: Rogene Houston, MD;  [facility];  Service: Endoscopy;  Laterality: N/A;  830   FINGER AMPUTATION Left    4th and 5th   left eye blindness Left    SVT ABLATION N/A 09/21/2018   Procedure: SVT ABLATION;  Surgeon: Constance Haw, MD;  Location: [facility];  Service: Cardiovascular;  Laterality: N/A;    SOCIAL HISTORY:  Social History   Socioeconomic History   Marital status: Widowed    Spouse name: Not on file   Number of children: Not on file   Years of education: Not on file   Highest education level: Not on file  Occupational History   Not on file  Tobacco Use   Smoking status: Former    Packs/day: 0.50    Types: Cigarettes    Start  date: 08/09/1956    Quit date: 08/10/1979    Years since quitting: 42.1   Smokeless tobacco: Never  Vaping Use   Vaping Use: Never used  Substance and Sexual Activity   Alcohol use: No    Alcohol/week: 0.0 standard drinks   Drug use: No   Sexual activity: Not on file  Other Topics Concern   Not on file  Social History Narrative   Not on file   Social Determinants of Health   Financial Resource Strain: Not on file  Food Insecurity: Not on file  Transportation Needs: Not on file  Physical Activity: Not on file  Stress: Not on file  Social Connections: Not on file  Intimate Partner Violence: Not on file    FAMILY HISTORY:  Family History  Problem Relation Age of Onset   CAD Mother    CAD Father    Cancer Brother     CURRENT MEDICATIONS:  Current Outpatient Medications  Medication Sig Dispense Refill   acetaminophen (TYLENOL) 325 MG tablet Take 2 tablets (650 mg total) by mouth every 6 (six) hours as needed for mild pain (or Fever >/= 101).     albuterol (VENTOLIN HFA) 108 (90 Base) MCG/ACT inhaler Inhale 2 puffs into the lungs every 6 (six) hours as needed.     cholecalciferol (VITAMIN D) 1000 units tablet Take 1,000 Units by mouth daily.  cloNIDine (CATAPRES) 0.1 MG tablet Take 0.1 mg by mouth daily.     hydrochlorothiazide (HYDRODIURIL) 25 MG tablet Take 25 mg by mouth daily.      levocetirizine (XYZAL) 5 MG tablet Take 5 mg by mouth daily.     lisinopril (PRINIVIL,ZESTRIL) 40 MG tablet Take 40 mg by mouth daily.      pantoprazole (PROTONIX) 40 MG tablet Take 1 tablet (40 mg total) by mouth daily. 30 tablet 1   vitamin B-12 (CYANOCOBALAMIN) 1000 MCG tablet Take 1,000 mcg by mouth daily.     No current facility-administered medications for this visit.    ALLERGIES:  Allergies  Allergen Reactions   Tamsulosin Hcl Nausea Only   Aspirin Other (See Comments)    Gi upset, can tolerate baby aspirin    PHYSICAL EXAM:  Performance status (ECOG): 1 - Symptomatic but  completely ambulatory  Vitals:   10/01/21 1257  BP: 122/69  Pulse: (!) 103  Resp: 18  Temp: 98.4 F (36.9 C)  SpO2: 98%   Wt Readings from Last 3 Encounters:  10/01/21 162 lb (73.5 kg)  08/28/21 158 lb 12.8 oz (72 kg)  08/06/21 160 lb (72.6 kg)   Physical Exam Vitals reviewed.  Constitutional:      Appearance: Normal appearance.  Cardiovascular:     Rate and Rhythm: Normal rate and regular rhythm.     Pulses: Normal pulses.     Heart sounds: Normal heart sounds.  Pulmonary:     Effort: Pulmonary effort is normal.     Breath sounds: Normal breath sounds.  Neurological:     General: No focal deficit present.     Mental Status: He is alert and oriented to person, place, and time.  Psychiatric:        Mood and Affect: Mood normal.        Behavior: Behavior normal.    LABORATORY DATA:  I have reviewed the labs as listed.  CBC Latest Ref Rng & Units 09/21/2021 09/18/2021 08/28/2021  WBC 4.0 - 10.5 K/uL 6.0 8.1 5.0  Hemoglobin 13.0 - 17.0 g/dL 7.4(L) 6.2(LL) 9.1(L)  Hematocrit 39.0 - 52.0 % 22.1(L) 18.6(L) 27.1(L)  Platelets 150 - 400 K/uL 40(L) 48(L) 46(L)   CMP Latest Ref Rng & Units 08/08/2021 08/06/2021 09/20/2018  Glucose 70 - 99 mg/dL 104(H) 121(H) 129(H)  BUN 8 - 23 mg/dL 29(H) 23 20  Creatinine 0.61 - 1.24 mg/dL 1.22 1.16 1.09  Sodium 135 - 145 mmol/L 139 134(L) 139  Potassium 3.5 - 5.1 mmol/L 4.1 4.0 3.5  Chloride 98 - 111 mmol/L 106 102 105  CO2 22 - 32 mmol/L _0 Calcium 8.9 - 10.3 mg/dL 8.6(L) 9.0 9.1  Total Protein 6.5 - 8.1 g/dL 5.8(L) - 7.1  Total Bilirubin 0.3 - 1.2 mg/dL 0.9 - 0.6  Alkaline Phos 38 - 126 U/L 44 - 62  AST 15 - 41 U/L 14(L) - 25  ALT 0 - 44 U/L 11 - 20      Component Value Date/Time   RBC 2.32 (L) 09/21/2021 0947   MCV 95.3 09/21/2021 0947   MCH 31.9 09/21/2021 0947   MCHC 33.5 09/21/2021 0947   RDW 23.4 (H) 09/21/2021 0947   LYMPHSABS 2.8 09/21/2021 0947   MONOABS 1.9 (H) 09/21/2021 0947   EOSABS 0.0 09/21/2021 0947    BASOSABS 0.0 09/21/2021 0947    DIAGNOSTIC IMAGING:  I have independently reviewed the scans and discussed with the patient. CT Biopsy  Result Date: 09/18/2021 INDICATION:  Anemia and thrombocytopenia EXAM: CT GUIDED BONE MARROW ASPIRATES AND BIOPSY MEDICATIONS: None. ANESTHESIA/SEDATION: Fentanyl 100 mcg IV; Versed 2 mg IV Moderate Sedation Time:  11 minutes The patient was continuously monitored during the procedure by the interventional radiology nurse under my direct supervision. COMPLICATIONS: None immediate. PROCEDURE: The procedure was explained to the patient. The risks and benefits of the procedure were discussed and the patient's questions were addressed. Informed consent was obtained from the patient. The patient was placed prone on CT table. Images of the pelvis were obtained. The right side of back was prepped and draped in sterile fashion. The skin and right posterior ilium were anesthetized with 1% lidocaine. 11 gauge bone needle was directed into the right ilium with CT guidance. Two aspirates and 1 core biopsy were obtained. Bandage placed over the puncture site. IMPRESSION: CT guided bone marrow aspiration and core biopsy. Electronically Signed   By: Miachel Roux M.D.   On: 09/18/2021 12:13   CT BONE MARROW BIOPSY & ASPIRATION  Result Date: 09/18/2021 INDICATION: Anemia and thrombocytopenia EXAM: CT GUIDED BONE MARROW ASPIRATES AND BIOPSY MEDICATIONS: None. ANESTHESIA/SEDATION: Fentanyl 100 mcg IV; Versed 2 mg IV Moderate Sedation Time:  11 minutes The patient was continuously monitored during the procedure by the interventional radiology nurse under my direct supervision. COMPLICATIONS: None immediate. PROCEDURE: The procedure was explained to the patient. The risks and benefits of the procedure were discussed and the patient's questions were addressed. Informed consent was obtained from the patient. The patient was placed prone on CT table. Images of the pelvis were obtained. The right  side of back was prepped and draped in sterile fashion. The skin and right posterior ilium were anesthetized with 1% lidocaine. 11 gauge bone needle was directed into the right ilium with CT guidance. Two aspirates and 1 core biopsy were obtained. Bandage placed over the puncture site. IMPRESSION: CT guided bone marrow aspiration and core biopsy. Electronically Signed   By: Miachel Roux M.D.   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  Hemoglobin 15.5 on 09/20/2018.  Platelet count on 09/20/1998 2155. - He was on aspirin 81 mg at presentation.  He received 3 units PRBC.  Ferritin was 244 and percent saturation 37.  C62 was 376 and folic acid normal.  Creatinine was normal. - MMA, copper, folic acid and LDH was normal.  SPEP negative. - Serum EPO 140.  Kappa light chains elevated at 32.4 with ratio 1.55. - Bone marrow biopsy on 09/18/2021: Hypercellular marrow with trilineage dyspoiesis and monocytosis.  Dyspoiesis in the granulocytic precursors without apparent late maturation of neutrophils, increased monocytes but no significant blast population.  Increased megakaryocytes with widely separated nuclear lobes.  Erythroid precursors slightly decreased in number with the type ER.  Ring sideroblasts not identified. - Chromosome analysis and MDS FISH panel was normal.    Social/family history: - Lives at home by himself.  Son lives in [address].  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  No exposure to chemicals.  Non-smoker. - Brother had colon cancer.  Another brother had?  Stomach cancer.  2 maternal uncles had cancer, type unknown to the patient.   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  He  has peripheral blood monocytosis. - Based on [facility] molecular model, he has at least two-point with decreased hemoglobin and platelets.  We do not have NGS panel.  He is intermediate to risk with OS 31 months. - He has high risk CMML and is not a transplant candidate.  He has fairly good performance status and lives independently and does all his ADLs and IADLs. - As he is transfusion dependent and symptomatic and with severe thrombocytopenia, I have recommended treatment with hypomethylating agent. - We discussed treatment with azacitidine 5 to 7 days every 28 days. - We discussed side effects in detail.  He is agreeable. - I have recommended port placement.  We will arrange for platelet transfusion if needed prior to surgery. - We will send intelligen myeloid panel today. - Tentatively will start him on therapy after the port placement. - We have done a CBC today which showed hemoglobin 6.6.  We will transfuse him 2 units PRBC.  Orders placed this encounter:  No orders of the defined types were placed in this encounter.    Derek Jack, MD [place] [phone]   I, Thana Ates, am acting as a scribe for Dr. Derek Jack.  I, Derek Jack MD, have reviewed the above documentation for accuracy and completeness, and I agree with the above.

## 2021-10-01 NOTE — Patient Instructions (Addendum)
[place] at [facility] Discharge Instructions   You were seen and examined today by Dr. Delton Coombes.  He reviewed the results of your bone marrow biopsy. It seems you have a condition called myelodysplastic syndrome.  It is a disease in which your body's bone marrow is unable to make your blood cells like it should.  This is why you are requiring blood transfusions and feeling so tired and weak.   We will plan to start you on a treatment called Vidaza.  It is an infusion that will require you come to the clinic every day Monday-Friday every 4 weeks.   We will refer you to have a port a cath placed.  We will give all your treatments through this.   Return as scheduled.    Thank you for choosing [place] at [facility] to provide your oncology and hematology care.  To afford each patient quality time with our provider, please arrive at least 15 minutes before your scheduled appointment time.   If you have a lab appointment with the [facility] please come in thru the Main Entrance and check in at the main information desk.  You need to re-schedule your appointment should you arrive 10 or more minutes late.  We strive to give you quality time with our providers, and arriving late affects you and other patients whose appointments are after yours.  Also, if you no show three or more times for appointments you may be dismissed from the clinic at the providers discretion.     Again, thank you for choosing [facility].  Our hope is that these requests will decrease the amount of time that you wait before being seen by our physicians.       _____________________________________________________________  Should you have questions after your visit to [facility], please contact our office at [phone] and follow the prompts.  Our office hours are 8:00 a.m. and 4:30 p.m. Monday - Friday.  Please note that voicemails left  after 4:00 p.m. may not be returned until the following business day.  We are closed weekends and major holidays.  You do have access to a nurse 24-7, just call the main number to the clinic [phone] and do not press any options, hold on the line and a nurse will answer the phone.    For prescription refill requests, have your pharmacy contact our office and allow 72 hours.    Due to Covid, you will need to wear a mask upon entering the hospital. If you do not have a mask, a mask will be given to you at the Main Entrance upon arrival. For doctor visits, patients may have 1 support person age 26 or older with them. For treatment visits, patients can not have anyone with them due to social distancing guidelines and our immunocompromised population.

## 2021-10-01 NOTE — Progress Notes (Signed)
START OFF PATHWAY REGIMEN - Other ° ° °[id number]:Azacitidine 75 mg/m2 IV D1-7 q28 Days: °  A cycle is every 28 days: °    Azacitidine  ° °**Always confirm dose/schedule in your pharmacy ordering system** ° °Patient Characteristics: °Intent of Therapy: °Non-Curative / Palliative Intent, Discussed with Patient °

## 2021-10-02 ENCOUNTER — Inpatient Hospital Stay (HOSPITAL_COMMUNITY): Payer: Medicare HMO

## 2021-10-02 ENCOUNTER — Encounter (HOSPITAL_COMMUNITY): Payer: Self-pay

## 2021-10-02 DIAGNOSIS — C9310 Chronic myelomonocytic leukemia not having achieved remission: Secondary | ICD-10-CM

## 2021-10-02 MED ORDER — SODIUM CHLORIDE 0.9% IV SOLUTION
250.0000 mL | Freq: Once | INTRAVENOUS | Status: AC
  Administered 2021-10-02: 250 mL via INTRAVENOUS

## 2021-10-02 MED ORDER — ACETAMINOPHEN 325 MG PO TABS
650.0000 mg | ORAL_TABLET | Freq: Once | ORAL | Status: AC
  Administered 2021-10-02: 650 mg via ORAL
  Filled 2021-10-02: qty 2

## 2021-10-02 MED ORDER — DIPHENHYDRAMINE HCL 25 MG PO CAPS
25.0000 mg | ORAL_CAPSULE | Freq: Once | ORAL | Status: AC
  Administered 2021-10-02: 25 mg via ORAL
  Filled 2021-10-02: qty 1

## 2021-10-02 NOTE — Progress Notes (Signed)
Patient presents today for 2 units of PRBC's. Vital signs stable. Patient has no complaints of any changes since his last visit. MAR reviewed and updated.    2 units of blood given today per MD orders. Tolerated infusion without adverse affects. Vital signs stable. No complaints at this time. Discharged from clinic ambulatory in stable condition. Alert and oriented x 3. F/U with [facility] as scheduled.

## 2021-10-02 NOTE — Patient Instructions (Signed)
[facility]  Discharge Instructions: Thank you for choosing [facility] to provide your oncology and hematology care.  If you have a lab appointment with the [facility], please come in thru the Main Entrance and check in at the main information desk.  Wear comfortable clothing and clothing appropriate for easy access to any Portacath or PICC line.   We strive to give you quality time with your provider. You may need to reschedule your appointment if you arrive late (15 or more minutes).  Arriving late affects you and other patients whose appointments are after yours.  Also, if you miss three or more appointments without notifying the office, you may be dismissed from the clinic at the providers discretion.      For prescription refill requests, have your pharmacy contact our office and allow 72 hours for refills to be completed.    Today you received the following : 2 Units of blood.       To help prevent nausea and vomiting after your treatment, we encourage you to take your nausea medication as directed.  BELOW ARE SYMPTOMS THAT SHOULD BE REPORTED IMMEDIATELY: *FEVER GREATER THAN 100.4 F (38 C) OR HIGHER *CHILLS OR SWEATING *NAUSEA AND VOMITING THAT IS NOT CONTROLLED WITH YOUR NAUSEA MEDICATION *UNUSUAL SHORTNESS OF BREATH *UNUSUAL BRUISING OR BLEEDING *URINARY PROBLEMS (pain or burning when urinating, or frequent urination) *BOWEL PROBLEMS (unusual diarrhea, constipation, pain near the anus) TENDERNESS IN MOUTH AND THROAT WITH OR WITHOUT PRESENCE OF ULCERS (sore throat, sores in mouth, or a toothache) UNUSUAL RASH, SWELLING OR PAIN  UNUSUAL VAGINAL DISCHARGE OR ITCHING   Items with * indicate a potential emergency and should be followed up as soon as possible or go to the Emergency Department if any problems should occur.  Please show the CHEMOTHERAPY ALERT CARD or IMMUNOTHERAPY ALERT CARD at check-in to the Emergency Department and triage  nurse.  Should you have questions after your visit or need to cancel or reschedule your appointment, please contact [facility] [phone]  and follow the prompts.  Office hours are 8:00 a.m. to 4:30 p.m. Monday - Friday. Please note that voicemails left after 4:00 p.m. may not be returned until the following business day.  We are closed weekends and major holidays. You have access to a nurse at all times for urgent questions. Please call the main number to the clinic [phone] and follow the prompts.  For any non-urgent questions, you may also contact your provider using MyChart. We now offer e-Visits for anyone 44 and older to request care online for non-urgent symptoms. For details visit [email or web address].   Also download the MyChart app! Go to the app store, search "MyChart", open the app, select [facility], and log in with your MyChart username and password.  Due to Covid, a mask is required upon entering the hospital/clinic. If you do not have a mask, one will be given to you upon arrival. For doctor visits, patients may have 1 support person aged 104 or older with them. For treatment visits, patients cannot have anyone with them due to current Covid guidelines and our immunocompromised population.

## 2021-10-06 LAB — TYPE AND SCREEN
ABO/RH(D): B POS
Antibody Screen: NEGATIVE
Unit division: 0
Unit division: 0

## 2021-10-06 LAB — BPAM RBC
Blood Product Expiration Date: 202303232359
Blood Product Expiration Date: 202303252359
ISSUE DATE / TIME: 202302240917
ISSUE DATE / TIME: 202302241115
Unit Type and Rh: 1700
Unit Type and Rh: 1700

## 2021-10-07 ENCOUNTER — Other Ambulatory Visit (HOSPITAL_COMMUNITY): Payer: Self-pay

## 2021-10-07 DIAGNOSIS — D649 Anemia, unspecified: Secondary | ICD-10-CM

## 2021-10-07 DIAGNOSIS — C9310 Chronic myelomonocytic leukemia not having achieved remission: Secondary | ICD-10-CM

## 2021-10-08 ENCOUNTER — Encounter: Payer: Self-pay | Admitting: General Surgery

## 2021-10-08 ENCOUNTER — Inpatient Hospital Stay (HOSPITAL_COMMUNITY): Payer: Medicare HMO | Attending: Hematology

## 2021-10-08 ENCOUNTER — Other Ambulatory Visit: Payer: Self-pay

## 2021-10-08 ENCOUNTER — Ambulatory Visit: Payer: Medicare HMO | Admitting: General Surgery

## 2021-10-08 VITALS — BP 146/79 | HR 83 | Temp 97.4°F | Resp 14 | Ht 66.54 in | Wt 161.0 lb

## 2021-10-08 DIAGNOSIS — C9310 Chronic myelomonocytic leukemia not having achieved remission: Secondary | ICD-10-CM | POA: Insufficient documentation

## 2021-10-08 DIAGNOSIS — Z87891 Personal history of nicotine dependence: Secondary | ICD-10-CM | POA: Insufficient documentation

## 2021-10-08 DIAGNOSIS — D469 Myelodysplastic syndrome, unspecified: Secondary | ICD-10-CM | POA: Diagnosis not present

## 2021-10-08 DIAGNOSIS — Z5111 Encounter for antineoplastic chemotherapy: Secondary | ICD-10-CM | POA: Diagnosis present

## 2021-10-08 DIAGNOSIS — D696 Thrombocytopenia, unspecified: Secondary | ICD-10-CM | POA: Insufficient documentation

## 2021-10-08 DIAGNOSIS — Z79899 Other long term (current) drug therapy: Secondary | ICD-10-CM | POA: Insufficient documentation

## 2021-10-08 DIAGNOSIS — D649 Anemia, unspecified: Secondary | ICD-10-CM

## 2021-10-08 LAB — CBC
HCT: 27.1 % — ABNORMAL LOW (ref 39.0–52.0)
Hemoglobin: 8.5 g/dL — ABNORMAL LOW (ref 13.0–17.0)
MCH: 30.1 pg (ref 26.0–34.0)
MCHC: 31.4 g/dL (ref 30.0–36.0)
MCV: 96.1 fL (ref 80.0–100.0)
Platelets: 38 10*3/uL — ABNORMAL LOW (ref 150–400)
RBC: 2.82 MIL/uL — ABNORMAL LOW (ref 4.22–5.81)
RDW: 20.2 % — ABNORMAL HIGH (ref 11.5–15.5)
WBC: 10.2 10*3/uL (ref 4.0–10.5)
nRBC: 0.5 % — ABNORMAL HIGH (ref 0.0–0.2)

## 2021-10-08 LAB — SAMPLE TO BLOOD BANK

## 2021-10-08 NOTE — H&P (Signed)
Dustin Tucker; [id number]; March 31, 1932 ? ? ?HPI ?Patient is an 88 year old black male who was referred to my care by Dr. Delton Coombes of oncology for Port-A-Cath placement.  He has myelodysplastic syndrome and is about to undergo chemotherapy.  His history is significant for thrombocytopenia. ?Past Medical History:  ?Diagnosis Date  ? 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 Hypertension   ? ? ?Past Surgical History:  ?Procedure Laterality Date  ? CARPAL TUNNEL RELEASE Right   ? COLONOSCOPY N/A 09/19/2013  ? Procedure: COLONOSCOPY;  Surgeon: Rogene Houston, MD;  [facility];  Service: Endoscopy;  Laterality: N/A;  830  ? FINGER AMPUTATION Left   ? 4th and 5th  ? left eye blindness Left   ? SVT ABLATION N/A 09/21/2018  ? Procedure: SVT ABLATION;  Surgeon: Constance Haw, MD;  Location: [facility];  Service: Cardiovascular;  Laterality: N/A;  ? ? ?Family History  ?Problem Relation Age of Onset  ? CAD Mother   ? CAD Father   ? Cancer Brother   ? ? ?Current Outpatient Medications on File Prior to Visit  ?Medication Sig Dispense Refill  ? acetaminophen (TYLENOL) 325 MG tablet Take 2 tablets (650 mg total) by mouth every 6 (six) hours as needed for mild pain (or Fever >/= 101).    ? albuterol (VENTOLIN HFA) 108 (90 Base) MCG/ACT inhaler Inhale 2 puffs into the lungs every 6 (six) hours as needed.    ? cholecalciferol (VITAMIN D) 1000 units tablet Take 1,000 Units by mouth daily.    ? cloNIDine (CATAPRES) 0.1 MG tablet Take 0.1 mg by mouth daily.    ? hydrochlorothiazide (HYDRODIURIL) 25 MG tablet Take 25 mg by mouth daily.     ? levocetirizine (XYZAL) 5 MG tablet Take 5 mg by mouth daily.    ? lisinopril (PRINIVIL,ZESTRIL) 40 MG tablet Take 40 mg by mouth daily.     ? pantoprazole (PROTONIX) 40 MG tablet Take 1 tablet (40 mg total) by mouth daily. 30 tablet 1  ? vitamin B-12 (CYANOCOBALAMIN) 1000 MCG tablet Take 1,000 mcg by mouth daily.    ? ?No current facility-administered  medications on file prior to visit.  ? ? ?Allergies  ?Allergen Reactions  ? Tamsulosin Hcl Nausea Only  ? Aspirin Other (See Comments)  ?  Gi upset, can tolerate baby aspirin  ? ? ?Social History  ? ?Substance and Sexual Activity  ?Alcohol Use No  ? Alcohol/week: 0.0 standard drinks  ? ? ?Social History  ? ?Tobacco Use  ?Smoking Status Former  ? Packs/day: 0.50  ? Types: Cigarettes  ? Start date: 08/09/1956  ? Quit date: 08/10/1979  ? Years since quitting: 42.1  ?Smokeless Tobacco Never  ? ? ?Review of Systems  ?Constitutional: Negative.   ?HENT: Negative.    ?Eyes: Negative.   ?Respiratory: Negative.    ?Cardiovascular: Negative.   ?Gastrointestinal: Negative.   ?Genitourinary: Negative.   ?Musculoskeletal:  Positive for neck pain.  ?Skin: Negative.   ?Neurological: Negative.   ?Endo/Heme/Allergies: Negative.   ?Psychiatric/Behavioral: Negative.    ? ?Objective  ? ?Vitals:  ? 10/08/21 1038  ?BP: (!) 146/79  ?Pulse: 83  ?Resp: 14  ?Temp: (!) 97.4 ?F (36.3 ?C)  ?SpO2: 98%  ? ? ?Physical Exam ?Vitals reviewed.  ?Constitutional:   ?   Appearance: Normal appearance. He is normal weight. He is not ill-appearing.  ?HENT:  ?   Head: Normocephalic  and atraumatic.  ?Cardiovascular:  ?   Rate and Rhythm: Normal rate and regular rhythm.  ?   Heart sounds: Normal heart sounds. No murmur heard. ?  No friction rub. No gallop.  ?Pulmonary:  ?   Effort: Pulmonary effort is normal. No respiratory distress.  ?   Breath sounds: Normal breath sounds. No stridor. No wheezing, rhonchi or rales.  ?Skin: ?   General: Skin is warm and dry.  ?Neurological:  ?   Mental Status: He is alert and oriented to person, place, and time.  ?Oncology notes reviewed ? ?Assessment  ?Myelodysplastic syndrome, thrombocytopenia, need for central venous access ?Plan  ?Patient is scheduled for Port-A-Cath insertion on 10/16/2021.  In discussion with the cancer center, he will receive platelets prior to insertion of the Port-A-Cath.  The risks and benefits of the  procedure including bleeding, infection, and pneumothorax were fully explained to the patient, who gave informed consent. ?

## 2021-10-08 NOTE — Progress Notes (Signed)
Dustin Tucker; [id number]; [birth year] ? ? ?HPI ?Patient is an 88 year old black male who was referred to my care by Dr. Delton Coombes of oncology for Port-A-Cath placement.  He has myelodysplastic syndrome and is about to undergo chemotherapy.  His history is significant for thrombocytopenia. ?Past Medical History:  ?Diagnosis Date  ? 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 Hypertension   ? ? ?Past Surgical History:  ?Procedure Laterality Date  ? CARPAL TUNNEL RELEASE Right   ? COLONOSCOPY N/A 09/19/2013  ? Procedure: COLONOSCOPY;  Surgeon: Rogene Houston, MD;  Location: [facility];  Service: Endoscopy;  Laterality: N/A;  830  ? FINGER AMPUTATION Left   ? 4th and 5th  ? left eye blindness Left   ? SVT ABLATION N/A 09/21/2018  ? Procedure: SVT ABLATION;  Surgeon: Constance Haw, MD;  Location: [facility];  Service: Cardiovascular;  Laterality: N/A;  ? ? ?Family History  ?Problem Relation Age of Onset  ? CAD Mother   ? CAD Father   ? Cancer Brother   ? ? ?Current Outpatient Medications on File Prior to Visit  ?Medication Sig Dispense Refill  ? acetaminophen (TYLENOL) 325 MG tablet Take 2 tablets (650 mg total) by mouth every 6 (six) hours as needed for mild pain (or Fever >/= 101).    ? albuterol (VENTOLIN HFA) 108 (90 Base) MCG/ACT inhaler Inhale 2 puffs into the lungs every 6 (six) hours as needed.    ? cholecalciferol (VITAMIN D) 1000 units tablet Take 1,000 Units by mouth daily.    ? cloNIDine (CATAPRES) 0.1 MG tablet Take 0.1 mg by mouth daily.    ? hydrochlorothiazide (HYDRODIURIL) 25 MG tablet Take 25 mg by mouth daily.     ? levocetirizine (XYZAL) 5 MG tablet Take 5 mg by mouth daily.    ? lisinopril (PRINIVIL,ZESTRIL) 40 MG tablet Take 40 mg by mouth daily.     ? pantoprazole (PROTONIX) 40 MG tablet Take 1 tablet (40 mg total) by mouth daily. 30 tablet 1  ? vitamin B-12 (CYANOCOBALAMIN) 1000 MCG tablet Take 1,000 mcg by mouth daily.    ? ?No current facility-administered  medications on file prior to visit.  ? ? ?Allergies  ?Allergen Reactions  ? Tamsulosin Hcl Nausea Only  ? Aspirin Other (See Comments)  ?  Gi upset, can tolerate baby aspirin  ? ? ?Social History  ? ?Substance and Sexual Activity  ?Alcohol Use No  ? Alcohol/week: 0.0 standard drinks  ? ? ?Social History  ? ?Tobacco Use  ?Smoking Status Former  ? Packs/day: 0.50  ? Types: Cigarettes  ? Start date: 08/09/1956  ? Quit date: 08/10/1979  ? Years since quitting: 42.1  ?Smokeless Tobacco Never  ? ? ?Review of Systems  ?Constitutional: Negative.   ?HENT: Negative.    ?Eyes: Negative.   ?Respiratory: Negative.    ?Cardiovascular: Negative.   ?Gastrointestinal: Negative.   ?Genitourinary: Negative.   ?Musculoskeletal:  Positive for neck pain.  ?Skin: Negative.   ?Neurological: Negative.   ?Endo/Heme/Allergies: Negative.   ?Psychiatric/Behavioral: Negative.    ? ?Objective  ? ?Vitals:  ? 10/08/21 1038  ?BP: (!) 146/79  ?Pulse: 83  ?Resp: 14  ?Temp: (!) 97.4 ?F (36.3 ?C)  ?SpO2: 98%  ? ? ?Physical Exam ?Vitals reviewed.  ?Constitutional:   ?   Appearance: Normal appearance. He is normal weight. He is not ill-appearing.  ?HENT:  ?   Head: Normocephalic  and atraumatic.  ?Cardiovascular:  ?   Rate and Rhythm: Normal rate and regular rhythm.  ?   Heart sounds: Normal heart sounds. No murmur heard. ?  No friction rub. No gallop.  ?Pulmonary:  ?   Effort: Pulmonary effort is normal. No respiratory distress.  ?   Breath sounds: Normal breath sounds. No stridor. No wheezing, rhonchi or rales.  ?Skin: ?   General: Skin is warm and dry.  ?Neurological:  ?   Mental Status: He is alert and oriented to person, place, and time.  ?Oncology notes reviewed ? ?Assessment  ?Myelodysplastic syndrome, thrombocytopenia, need for central venous access ?Plan  ?Patient is scheduled for Port-A-Cath insertion on 10/16/2021.  In discussion with the cancer center, he will receive platelets prior to insertion of the Port-A-Cath.  The risks and benefits of the  procedure including bleeding, infection, and pneumothorax were fully explained to the patient, who gave informed consent. ?

## 2021-10-14 ENCOUNTER — Encounter (HOSPITAL_COMMUNITY)
Admission: RE | Admit: 2021-10-14 | Discharge: 2021-10-14 | Disposition: A | Discharge status: Home / Self Care | Payer: Medicare HMO | Source: Ambulatory Visit | Attending: General Surgery | Admitting: General Surgery

## 2021-10-14 LAB — MISC LABCORP TEST (SEND OUT): Labcorp test code: 451953

## 2021-10-14 NOTE — Pre-Procedure Instructions (Signed)
Attempted to do pre-op phone call. Line is busy X2. Attempted son's number and the mailbox is full. ?

## 2021-10-14 NOTE — Patient Instructions (Addendum)
**Note Dustin Tucker** ? ? DEIONTE SPIVACK ? 10/14/2021  ?  ? '@PREFPERIOPPHARMACY'$ @ ? ? Your procedure is scheduled on 10/16/2021. ? Report to [facility] at  8:30  A.M. ? Call this number if you have problems the morning of surgery: ? [phone] ? ? Remember: ? Do not eat or drink after midnight. ?  ?  ? Take these medicines the morning of surgery with A SIP OF WATER : Clonidine, Xyzal and Protonix ?  ? Do not wear jewelry, make-up or nail polish. ? Do not wear lotions, powders, or perfumes, or deodorant. ? Do not shave 48 hours prior to surgery.  Men may shave face and neck. ? Do not bring valuables to the hospital. ? [facility] is not responsible for any belongings or valuables. ? ?Contacts, dentures or bridgework may not be worn into surgery.  Leave your suitcase in the car.  After surgery it may be brought to your room. ? ?For patients admitted to the hospital, discharge time will be determined by your treatment team. ? ?Patients discharged the day of surgery will not be allowed to drive home.  ? ?Name and phone number of your driver:   Family ?Special instructions:  N/A ? ?Please read over the following fact sheets that you were given. ?Pain Booklet ? ? ? ?Implanted Port Insertion ?Implanted port insertion is a procedure to put in a port and catheter. The port is a device with an injectable disc that can be accessed by your health care provider. The port is connected to a vein in the chest or neck by a small, thin tube (catheter). There are different types of ports. The implanted port may be used as a long-term IV access for: ?Medicines, such as chemotherapy. ?Fluids. ?Liquid nutrition, such as total parenteral nutrition (TPN). ?When you have a port, your health care provider can choose to use the port instead of veins in your arms for these procedures. ?Tell a health care provider about: ?Any allergies you have. ?All medicines you are taking, especially blood thinners, as well as any vitamins, herbs, eye drops, creams,  over-the-counter medicines, and steroids. ?Any problems you or family members have had with anesthetic medicines. ?Any bleeding problems you have. ?Any surgeries you have had. ?Any medical conditions you have or have had, including diabetes or kidney problems. ?Whether you are pregnant or may be pregnant. ?What are the risks? ?Generally, this is a safe procedure. However, problems may occur, including: ?Allergic reactions to medicines or dyes. ?Damage to other structures or organs. ?Infection. ?Damage to the blood vessel, bruising, or bleeding at the puncture site. ?Blood clot. ?Breakdown of the skin over the port. ?A collection of air in the chest that can cause one of the lungs to collapse (pneumothorax). This is rare. ?What happens before the procedure? ?When to stop eating and drinking ?Follow instructions from your health care provider about what you may eat and drink before your procedure. These may include: ?8 hours before your procedure ?Stop eating most foods. Do not eat meat, fried foods, or fatty foods. ?Eat only light foods, such as toast or crackers. ?All liquids are okay except energy drinks and alcohol. ?6 hours before your procedure ?Stop eating. ?Drink only clear liquids, such as water, clear fruit juice, black coffee, plain tea, and sports drinks. ?Do not drink energy drinks or alcohol. ?2 hours before your procedure ?Stop drinking all liquids. ?You may be allowed to take medicines with small sips of water. ?If you do not follow your health  care provider's instructions, your procedure may be delayed or canceled. ?Medicines ?Ask your health care provider about: ?Changing or stopping your regular medicines. This is especially important if you are taking diabetes medicines or blood thinners. ?Taking medicines such as aspirin and ibuprofen. These medicines can thin your blood. Do not take these medicines unless your health care provider tells you to take them. ?Taking over-the-counter medicines,  vitamins, herbs, and supplements. ?General instructions ?If you will be going home right after the procedure, plan to have a responsible adult: ?Take you home from the hospital or clinic. You will not be allowed to drive. ?Care for you for the time you are told. ?You may have blood tests. ?Do not use any products that contain nicotine or tobacco for at least 4 weeks before the procedure. These products include cigarettes, chewing tobacco, and vaping devices, such as e-cigarettes. If you need help quitting, ask your health care provider. ?Ask your health care provider what steps will be taken to help prevent infection. These may include: ?Removing hair at the surgery site. ?Washing skin with a germ-killing soap. ?Taking antibiotic medicine. ?What happens during the procedure? ? ?An IV will be inserted into one of your veins. ?You will be given one or more of the following: ?A medicine to help you relax (sedative). ?A medicine to numb the area (local anesthetic). ?Two small incisions will be made to insert the port. ?One smaller incision will be made in your neck to get access to the vein where the catheter will lie. ?The other incision will be made in the upper chest. This is where the port will lie. ?The procedure may be done using continuous X-ray (fluoroscopy) or other imaging tools for guidance. ?The port and catheter will be placed. There may be a small, raised area where the port is placed. ?The port will be flushed with a saline solution, which is made of salt and water, and blood will be drawn to make sure that the port is working correctly. ?The incisions will be closed. ?Bandages (dressings) may be placed over the incisions. ?The procedure may vary among health care providers and hospitals. ?What happens after the procedure? ?Your blood pressure, heart rate, breathing rate, and blood oxygen level will be monitored until you leave the hospital or clinic. ?If you were given a sedative during the procedure, it  can affect you for several hours. Do not drive or operate machinery until your health care provider says that it is safe. ?You will be given a manufacturer's information card for the type of port that you have. Keep this with you. ?Your port will need to be flushed and checked as told by your health care provider, usually every few weeks. ?A chest X-ray will be done to: ?Check the placement of the port. ?Make sure there is no injury to your lung. ?Summary ?Implanted port insertion is a procedure to put in a port and catheter. ?The implanted port is used as a long-term IV access. ?The port will need to be flushed and checked as told by your health care provider, usually every few weeks. ?Keep your manufacturer's information card with you at all times. ?This information is not intended to replace advice given to you by your health care provider. Make sure you discuss any questions you have with your health care provider. ?Document Revised: 01/27/2021 Document Reviewed: 01/27/2021 ?Elsevier Patient Education ? 2022 [employer]. ? ?  ? ? ?  ?

## 2021-10-15 ENCOUNTER — Inpatient Hospital Stay (HOSPITAL_COMMUNITY): Payer: Medicare HMO

## 2021-10-15 ENCOUNTER — Encounter (HOSPITAL_COMMUNITY): Payer: Self-pay | Admitting: Hematology

## 2021-10-15 ENCOUNTER — Encounter (HOSPITAL_COMMUNITY): Payer: Self-pay

## 2021-10-15 DIAGNOSIS — C9310 Chronic myelomonocytic leukemia not having achieved remission: Secondary | ICD-10-CM

## 2021-10-15 DIAGNOSIS — D469 Myelodysplastic syndrome, unspecified: Secondary | ICD-10-CM

## 2021-10-15 DIAGNOSIS — Z5111 Encounter for antineoplastic chemotherapy: Secondary | ICD-10-CM | POA: Diagnosis not present

## 2021-10-15 DIAGNOSIS — Z95828 Presence of other vascular implants and grafts: Secondary | ICD-10-CM

## 2021-10-15 HISTORY — DX: Presence of other vascular implants and grafts: Z95.828

## 2021-10-15 LAB — CBC WITH DIFFERENTIAL/PLATELET
Abs Immature Granulocytes: 0.15 10*3/uL — ABNORMAL HIGH (ref 0.00–0.07)
Basophils Absolute: 0 10*3/uL (ref 0.0–0.1)
Basophils Relative: 0 %
Eosinophils Absolute: 0 10*3/uL (ref 0.0–0.5)
Eosinophils Relative: 0 %
HCT: 25.5 % — ABNORMAL LOW (ref 39.0–52.0)
Hemoglobin: 8.2 g/dL — ABNORMAL LOW (ref 13.0–17.0)
Immature Granulocytes: 2 %
Lymphocytes Relative: 39 %
Lymphs Abs: 3.4 10*3/uL (ref 0.7–4.0)
MCH: 30.3 pg (ref 26.0–34.0)
MCHC: 32.2 g/dL (ref 30.0–36.0)
MCV: 94.1 fL (ref 80.0–100.0)
Monocytes Absolute: 3.5 10*3/uL — ABNORMAL HIGH (ref 0.1–1.0)
Monocytes Relative: 40 %
Neutro Abs: 1.6 10*3/uL — ABNORMAL LOW (ref 1.7–7.7)
Neutrophils Relative %: 19 %
Platelets: 44 10*3/uL — ABNORMAL LOW (ref 150–400)
RBC: 2.71 MIL/uL — ABNORMAL LOW (ref 4.22–5.81)
RDW: 20.2 % — ABNORMAL HIGH (ref 11.5–15.5)
WBC: 8.7 10*3/uL (ref 4.0–10.5)
nRBC: 0.5 % — ABNORMAL HIGH (ref 0.0–0.2)

## 2021-10-15 LAB — SAMPLE TO BLOOD BANK

## 2021-10-15 LAB — SURGICAL PATHOLOGY

## 2021-10-15 MED ORDER — PROCHLORPERAZINE MALEATE 10 MG PO TABS: 10.0000 mg | ORAL_TABLET | Freq: Four times a day (QID) | ORAL | 1 refills | Status: DC | PRN

## 2021-10-15 MED ORDER — LIDOCAINE-PRILOCAINE 2.5-2.5 % EX CREA: TOPICAL_CREAM | CUTANEOUS | 3 refills | Status: DC

## 2021-10-15 NOTE — Patient Instructions (Signed)
Dustin Tucker are diagnosed with myelodysplastic syndrome.  You will be treated in the clinic on days 1-7 (Monday-Friday, then return Monday and Tuesday of the following week).  That is considered one cycle of chemotherapy will repeat every 28 days (from the first day of each treatment).  The drug we will give you is called azacitadine (Vidaza).  The intent of treatment is to control this disease, prevent it from worsening, and to alleviate any symptoms you may be having related to this disease.  You will see the doctor regularly throughout treatment.  We will obtain blood work from you on the first day of treatment and the fifth day of treatment each cycle to monitor your results to make sure it is safe to give your treatment. The doctor monitors your response to treatment by the way you are feeling, your blood work, and by obtaining scans periodically.  There will be wait times while you are here for treatment.  It will take about 30 minutes to 1 hour for your lab work to result.  Then there will be wait times while pharmacy mixes your medications.       Medications you will receive in the clinic prior to your chemotherapy medications:   Aloxi:  ALOXI is used in adults to help prevent nausea and vomiting that happens with certain chemotherapy drugs.  Aloxi is a long acting medication, and will remain in your system for about two days.    Dexamethasone:  This is a steroid given prior to chemotherapy to help prevent allergic reactions; it may also help prevent and control nausea and diarrhea.        Azacitidine (Vidaza)   About This Drug Azacitidine is used to treat cancer. It is given in the vein (IV).  It will take 15 minutes to infuse.     Possible Side Effects  Bone marrow suppression. This is a decrease in the number of white blood cells, red blood cells, and platelets. This may raise your risk of infection, make you tired and weak, and raise your  risk of bleeding.    Fever and chills    Nausea and vomiting (throwing up)    Constipation (not able to move bowels)    Diarrhea (loose bowel movements)    Decreased potassium    Weakness    Bruising    Skin and tissue irritation including redness, pain, warmth, or swelling at the injection site.    Petechiae. Tiny red spots on the skin, often from low platelets.   Note: Each of the side effects above was reported in 30% or greater of patients treated with azacitidine. Not all possible side effects are included above.     Warnings and Precautions    Risk of changes in your liver function if you have underlying liver disease.    Changes in your kidney function, which can cause kidney failure and be life-threatening.    Tumor lysis syndrome which can be life-threatening: This drug may act on the cancer cells very quickly. This may affect how your kidneys work.     Important Information    This drug may be present in the saliva, tears, sweat, urine, stool, vomit, semen, and vaginal secretions. Talk to your doctor and/or your nurse about the necessary precautions to take during this time.     Treating Side Effects  Manage tiredness by pacing your activities for the day.    Be sure to include  periods of rest between energy-draining activities.    To help decrease the risk of infections, wash your hands regularly.    Avoid close contact with people who have a cold, the flu, or other infections.    Take your temperature as your doctor or nurse tells you, and whenever you feel like you may have a fever.    To help decrease the risk of bleeding, use a soft toothbrush. Check with your nurse before using dental floss.    Be very careful when using knives or tools.    Use an electric shaver instead of a razor.    Drink plenty of fluids (a minimum of eight glasses per day is recommended).    If you throw up or have diarrhea, you should drink more fluids so that you do not  become dehydrated (lack of water in the body from losing too much fluid).    To help with nausea and vomiting, eat small, frequent meals instead of three large meals a day. Choose foods and drinks that are at room temperature. Ask your nurse or doctor about other helpful tips and medicine that is available to help stop or lessen these symptoms.    If you have diarrhea, eat low-fiber foods that are high in protein and calories and avoid foods that can irritate your digestive tracts or lead to cramping.    If you are not able to move your bowels, check with your doctor or nurse before you use enemas, laxatives, or suppositories.    Ask your nurse or doctor about medicine that can lessen or stop your diarrhea and/or constipation.   Food and Drug Interactions  There are no known interactions of azacitidine with food.    This drug may interact with other medicines. Tell your doctor and pharmacist about all the medicines and dietary supplements (vitamins, minerals, herbs, and others) that you are taking at this time. Also, check with your doctor or pharmacist before starting any new prescription or over-the-counter medicines, or dietary supplements to make sure that there are no interactions.   When to Call the Doctor   Call your doctor or nurse if you have any of these symptoms and/or any new or unusual symptoms:  Fever of 100.4 F (38 C) or higher    Chills    Tiredness that interferes with your daily activities    Feeling dizzy or lightheaded    Easy bleeding or bruising    Nausea that stops you from eating or drinking and/or is not relieved by prescribed medicines    Throwing up    Diarrhea, 4 times in one day or diarrhea with lack of strength or a feeling of being dizzy    No bowel movement in 3 days or when you feel uncomfortable    Pain, redness, or swelling at the site of the injection    Any new tiny red spots on the skin    Decreased and/or dark urine    Signs of  possible liver problems: dark urine, pale bowel movements, pain in your abdomen, feeling very tired and weak, unusual itching, or yellowing of the eyes or skin    Signs of tumor lysis: confusion or agitation, decreased urine, nausea/vomiting, diarrhea, muscle cramping, numbness and/or tingling, seizures    If you think you may be pregnant or may have impregnated your partner   Reproduction Warnings  Pregnancy warning: This drug can have harmful effects on the unborn baby. Women of childbearing potential and men with male  partners of childbearing potential should use effective methods of birth control during your cancer treatment. Let your doctor know right away if you think you may be pregnant or may have impregnated your partner.    Breastfeeding warning: It is not known if this drug passes into breast milk. Women should not breastfeed during treatment because this drug could enter the breast milk and cause harm to a breastfeeding baby.    Fertility warning: In men and women both, this drug may affect your ability to have children in the future. Talk with your doctor or nurse if you plan to have children. Ask for information on sperm or egg banking.     SELF CARE ACTIVITIES WHILE RECEIVING CHEMOTHERAPY:   Hydration Increase your fluid intake 48 hours prior to treatment and drink at least 8 to 12 cups (64 ounces) of water/decaffeinated beverages per day after treatment. You can still have your cup of coffee or soda but these beverages do not count as part of your 8 to 12 cups that you need to drink daily. No alcohol intake.   Medications Continue taking your normal prescription medication as prescribed.  If you start any new herbal or new supplements please let us know first to make sure it is safe.   Mouth Care Have teeth cleaned professionally before starting treatment. Keep dentures and partial plates clean. Use soft toothbrush and do not use mouthwashes that contain alcohol. Biotene is  a good mouthwash that is available at most pharmacies or may be ordered by calling [phone]. Use warm salt water gargles (1 teaspoon salt per 1 quart warm water) before and after meals and at bedtime. If you need dental work, please let the doctor know before you go for your appointment so that we can coordinate the best possible time for you in regards to your chemo regimen. You need to also let your dentist know that you are actively taking chemo. We may need to do labs prior to your dental appointment.   Skin Care Always use sunscreen that has not expired and with SPF (Sun Protection Factor) of 50 or higher. Wear hats to protect your head from the sun. Remember to use sunscreen on your hands, ears, face, & feet.  Use good moisturizing lotions such as udder cream, eucerin, or even Vaseline. Some chemotherapies can cause dry skin, color changes in your skin and nails.     Avoid long, hot showers or baths. Use gentle, fragrance-free soaps and laundry detergent. Use moisturizers, preferably creams or ointments rather than lotions because the thicker consistency is better at preventing skin dehydration. Apply the cream or ointment within 15 minutes of showering. Reapply moisturizer at night, and moisturize your hands every time after you wash them.   Hair Loss (if your doctor says your hair will fall out)   If your doctor says that your hair is likely to fall out, decide before you begin chemo whether you want to wear a wig. You may want to shop before treatment to match your hair color. Hats, turbans, and scarves can also camouflage hair loss, although some people prefer to leave their heads uncovered. If you go bare-headed outdoors, be sure to use sunscreen on your scalp. Cut your hair short. It eases the inconvenience of shedding lots of hair, but it also can reduce the emotional impact of watching your hair fall out. Don't perm or color your hair during chemotherapy. Those chemical treatments  are already damaging to hair and can enhance hair  loss. Once your chemo treatments are done and your hair has grown back, it's OK to resume dyeing or perming hair.   With chemotherapy, hair loss is almost always temporary. But when it grows back, it may be a different color or texture. In older adults who still had hair color before chemotherapy, the new growth may be completely gray.  Often, new hair is very fine and soft.   Infection Prevention Please wash your hands for at least 30 seconds using warm soapy water. Handwashing is the #1 way to prevent the spread of germs. Stay away from sick people or people who are getting over a cold. If you develop respiratory systems such as green/yellow mucus production or productive cough or persistent cough let us know and we will see if you need an antibiotic. It is a good idea to keep a pair of gloves on when going into grocery stores/[employer] to decrease your risk of coming into contact with germs on the carts, etc. Carry alcohol hand gel with you at all times and use it frequently if out in public. If your temperature reaches 100.4 or higher please call the clinic and let us know.  If it is after hours or on the weekend please go to the ER if your temperature is over 100.4.  Please have your own personal thermometer at home to use.     Sex and bodily fluids If you are going to have sex, a condom must be used to protect the person that isn't taking chemotherapy. Chemo can decrease your libido (sex drive). For a few days after chemotherapy, chemotherapy can be excreted through your bodily fluids.  When using the toilet please close the lid and flush the toilet twice.  Do this for a few day after you have had chemotherapy.    Effects of chemotherapy on your sex life Some changes are simple and won't last long. They won't affect your sex life permanently.   Sometimes you may feel: too tired not strong enough to be very active sick or sore  not in the  mood anxious or low   Your anxiety might not seem related to sex. For example, you may be worried about the cancer and how your treatment is going. Or you may be worried about money, or about how you family are coping with your illness.  These things can cause stress, which can affect your interest in sex. It's important to talk to your partner about how you feel.  Remember - the changes to your sex life don't usually last long. There's usually no medical reason to stop having sex during chemo. The drugs won't have any long term physical effects on your performance or enjoyment of sex. Cancer can't be passed on to your partner during sex   Contraception It's important to use reliable contraception during treatment. Avoid getting pregnant while you or your partner are having chemotherapy. This is because the drugs may harm the baby. Sometimes chemotherapy drugs can leave a man or woman infertile.  This means you would not be able to have children in the future. You might want to talk to someone about permanent infertility. It can be very difficult to learn that you may no longer be able to have children. Some people find counselling helpful. There might be ways to preserve your fertility, although this is easier for men than for women. You may want to speak to a fertility expert. You can talk about sperm banking or harvesting your eggs.  You can also ask about other fertility options, such as donor eggs. If you have or have had breast cancer, your doctor might advise you not to take the contraceptive pill. This is because the hormones in it might affect the cancer. It is not known for sure whether or not chemotherapy drugs can be passed on through semen or secretions from the vagina. Because of this some doctors advise people to use a barrier method if you have sex during treatment. This applies to vaginal, anal or oral sex. Generally, doctors advise a barrier method only for the time you are actually having the  treatment and for about a week after your treatment. Advice like this can be worrying, but this does not mean that you have to avoid being intimate with your partner. You can still have close contact with your partner and continue to enjoy sex.   Animals If you have cats or birds we just ask that you not change the litter or change the cage.  Please have someone else do this for you while you are on chemotherapy.    Food Safety During and After Cancer Treatment Food safety is important for people both during and after cancer treatment. Cancer and cancer treatments, such as chemotherapy, radiation therapy, and stem cell/bone marrow transplantation, often weaken the immune system. This makes it harder for your body to protect itself from foodborne illness, also called food poisoning. Foodborne illness is caused by eating food that contains harmful bacteria, parasites, or viruses.   Foods to avoid Some foods have a higher risk of becoming tainted with bacteria. These include: Unwashed fresh fruit and vegetables, especially leafy vegetables that can hide dirt and other contaminants Raw sprouts, such as alfalfa sprouts Raw or undercooked beef, especially ground beef, or other raw or undercooked meat and poultry Fatty, fried, or spicy foods immediately before or after treatment.  These can sit heavy on your stomach and make you feel nauseous. Raw or undercooked shellfish, such as oysters. Sushi and sashimi, which often contain raw fish.  Unpasteurized beverages, such as unpasteurized fruit juices, raw milk, raw yogurt, or cider Undercooked eggs, such as soft boiled, over easy, and poached; raw, unpasteurized eggs; or foods made with raw egg, such as homemade raw cookie dough and homemade mayonnaise   Simple steps for food safety   Shop smart. Do not buy food stored or displayed in an unclean area. Do not buy bruised or damaged fruits or vegetables. Do not buy cans that have cracks, dents, or  bulges. Pick up foods that can spoil at the end of your shopping trip and store them in a cooler on the way home.   Prepare and clean up foods carefully. Rinse all fresh fruits and vegetables under running water, and dry them with a clean towel or paper towel. Clean the top of cans before opening them. After preparing food, wash your hands for 20 seconds with hot water and soap. Pay special attention to areas between fingers and under nails. Clean your utensils and dishes with hot water and soap. Disinfect your kitchen and cutting boards using 1 teaspoon of liquid, unscented bleach mixed into 1 quart of water.     Dispose of old food. Eat canned and packaged food before its expiration date (the use by or best before date). Consume refrigerated leftovers within 3 to 4 days. After that time, throw out the food. Even if the food does not smell or look spoiled, it still may be unsafe. Some  bacteria, such as Listeria, can grow even on foods stored in the refrigerator if they are kept for too long.   Take precautions when eating out. At restaurants, avoid buffets and salad bars where food sits out for a long time and comes in contact with many people. Food can become contaminated when someone with a virus, often a norovirus, or another bug handles it. Put any leftover food in a to-go container yourself, rather than having the server do it. And, refrigerate leftovers as soon as you get home. Choose restaurants that are clean and that are willing to prepare your food as you order it cooked.     AT HOME MEDICATIONS:                                                                                                                                                                 Compazine/Prochlorperazine '10mg'$  tablet. Take 1 tablet every 6 hours as needed for nausea/vomiting. (This can make you sleepy)     EMLA cream. Apply a quarter size amount to port site 1 hour prior to chemo. Do not rub in.  Cover with plastic wrap.       Diarrhea Sheet    If you are having loose stools/diarrhea, please purchase Imodium and begin taking as outlined:  At the first sign of poorly formed or loose stools you should begin taking Imodium (loperamide) 2 mg capsules.  Take two tablets ('4mg'$ ) followed by one tablet ('2mg'$ ) every 2 hours - DO NOT EXCEED 8 tablets in 24 hours.  If it is bedtime and you are having loose stools, take 2 tablets at bedtime, then 2 tablets every 4 hours until morning.    Always call the [facility] if you are having loose stools/diarrhea that you can't get under control.  Loose stools/diarrhea leads to dehydration (loss of water) in your body.  We have other options of trying to get the loose stools/diarrhea to stop but you must let us know!     Constipation Sheet   Colace - 100 mg capsules - take 2 capsules daily.  If this doesn't help then you can increase to 2 capsules twice daily.  Please call if the above does not work for you. Do not go more than 2 days without a bowel movement.  It is very important that you do not become constipated.  It will make you feel sick to your stomach (nausea) and can cause abdominal pain and vomiting.   Nausea Sheet    Compazine/Prochlorperazine '10mg'$  tablet. Take 1 tablet every 6 hours as needed for nausea/vomiting (This can make you drowsy).   If you are having persistent nausea (nausea that does not stop) please call the [facility] and let us know the amount of nausea that  you are experiencing.  If you begin to vomit, you need to call the [facility] and if it is the weekend and you have vomited more than one time and can't get it to stop-go to the Emergency Room.  Persistent nausea/vomiting can lead to dehydration (loss of fluid in your body) and will make you feel very weak and unwell. Ice chips, sips of clear liquids, foods that are at room temperature, crackers, and toast tend to be better tolerated.     SYMPTOMS TO REPORT AS SOON AS  POSSIBLE AFTER TREATMENT:   FEVER GREATER THAN 100.4 F   CHILLS WITH OR WITHOUT FEVER   NAUSEA AND VOMITING THAT IS NOT CONTROLLED WITH YOUR NAUSEA MEDICATION   UNUSUAL SHORTNESS OF BREATH   UNUSUAL BRUISING OR BLEEDING   TENDERNESS IN MOUTH AND THROAT WITH OR WITHOUT PRESENCE OF ULCERS   URINARY PROBLEMS   BOWEL PROBLEMS   UNUSUAL RASH         Wear comfortable clothing and clothing appropriate for easy access to any Portacath or PICC line. Let us know if there is anything that we can do to make your therapy better!       What to do if you need assistance after hours or on the weekends: CALL [phone].  HOLD on the line, do not hang up.  You will hear multiple messages but at the end you will be connected with a nurse triage line.  They will contact the doctor if necessary.  Most of the time they will be able to assist you.  Do not call the hospital operator.         I have been informed and understand all of the instructions given to me and have received a copy. I have been instructed to call the clinic [phone] or my family physician as soon as possible for continued medical care, if indicated. I do not have any more questions at this time but understand that I may call the [facility] or the Patient Navigator at [phone] during office hours should I have questions or need assistance in obtaining follow-up care.

## 2021-10-16 ENCOUNTER — Ambulatory Visit (HOSPITAL_COMMUNITY)
Admission: RE | Admit: 2021-10-16 | Discharge: 2021-10-16 | Disposition: A | Discharge status: Home / Self Care | Payer: Medicare HMO | Attending: General Surgery | Admitting: General Surgery

## 2021-10-16 ENCOUNTER — Ambulatory Visit (HOSPITAL_BASED_OUTPATIENT_CLINIC_OR_DEPARTMENT_OTHER): Payer: Medicare HMO | Admitting: Anesthesiology

## 2021-10-16 ENCOUNTER — Ambulatory Visit (HOSPITAL_COMMUNITY): Payer: Medicare HMO

## 2021-10-16 ENCOUNTER — Ambulatory Visit (HOSPITAL_COMMUNITY): Payer: Medicare HMO | Admitting: Anesthesiology

## 2021-10-16 ENCOUNTER — Other Ambulatory Visit: Payer: Self-pay

## 2021-10-16 ENCOUNTER — Encounter (HOSPITAL_COMMUNITY): Payer: Self-pay | Admitting: General Surgery

## 2021-10-16 ENCOUNTER — Inpatient Hospital Stay (HOSPITAL_COMMUNITY): Payer: Medicare HMO

## 2021-10-16 ENCOUNTER — Encounter (HOSPITAL_COMMUNITY)
Admission: RE | Disposition: A | Discharge status: Home / Self Care | Payer: Self-pay | Source: Home / Self Care | Attending: General Surgery

## 2021-10-16 ENCOUNTER — Encounter (HOSPITAL_COMMUNITY): Payer: Self-pay

## 2021-10-16 DIAGNOSIS — Z452 Encounter for adjustment and management of vascular access device: Secondary | ICD-10-CM | POA: Diagnosis present

## 2021-10-16 DIAGNOSIS — D469 Myelodysplastic syndrome, unspecified: Secondary | ICD-10-CM

## 2021-10-16 DIAGNOSIS — D696 Thrombocytopenia, unspecified: Secondary | ICD-10-CM | POA: Insufficient documentation

## 2021-10-16 DIAGNOSIS — C9310 Chronic myelomonocytic leukemia not having achieved remission: Secondary | ICD-10-CM

## 2021-10-16 DIAGNOSIS — D630 Anemia in neoplastic disease: Secondary | ICD-10-CM | POA: Diagnosis not present

## 2021-10-16 DIAGNOSIS — Z87891 Personal history of nicotine dependence: Secondary | ICD-10-CM | POA: Insufficient documentation

## 2021-10-16 DIAGNOSIS — I10 Essential (primary) hypertension: Secondary | ICD-10-CM

## 2021-10-16 DIAGNOSIS — Z79899 Other long term (current) drug therapy: Secondary | ICD-10-CM | POA: Diagnosis not present

## 2021-10-16 DIAGNOSIS — Z95828 Presence of other vascular implants and grafts: Secondary | ICD-10-CM

## 2021-10-16 HISTORY — PX: PORTACATH PLACEMENT: SHX2246

## 2021-10-16 SURGERY — INSERTION, TUNNELED CENTRAL VENOUS DEVICE, WITH PORT
Anesthesia: General | Site: Chest | Laterality: Right

## 2021-10-16 MED ORDER — HEPARIN SOD (PORK) LOCK FLUSH 100 UNIT/ML IV SOLN
INTRAVENOUS | Status: DC | PRN
  Administered 2021-10-16: 500 [IU] via INTRAVENOUS

## 2021-10-16 MED ORDER — ONDANSETRON HCL 4 MG/2ML IJ SOLN
INTRAMUSCULAR | Status: DC | PRN
Start: 2021-10-16 — End: 2021-10-16
  Administered 2021-10-16: 4 mg via INTRAVENOUS

## 2021-10-16 MED ORDER — FENTANYL CITRATE (PF) 250 MCG/5ML IJ SOLN
INTRAMUSCULAR | Status: AC
  Filled 2021-10-16: qty 5

## 2021-10-16 MED ORDER — ORAL CARE MOUTH RINSE: 15.0000 mL | Freq: Once | OROMUCOSAL | Status: AC

## 2021-10-16 MED ORDER — LIDOCAINE HCL (PF) 1 % IJ SOLN
INTRAMUSCULAR | Status: AC
Start: 2021-10-16 — End: ?
  Filled 2021-10-16: qty 30

## 2021-10-16 MED ORDER — CEFAZOLIN SODIUM-DEXTROSE 2-4 GM/100ML-% IV SOLN
INTRAVENOUS | Status: AC
  Filled 2021-10-16: qty 100

## 2021-10-16 MED ORDER — SODIUM CHLORIDE 0.9% IV SOLUTION
250.0000 mL | Freq: Once | INTRAVENOUS | Status: AC
  Administered 2021-10-16: 250 mL via INTRAVENOUS

## 2021-10-16 MED ORDER — SODIUM CHLORIDE (PF) 0.9 % IJ SOLN
INTRAMUSCULAR | Status: DC | PRN
  Administered 2021-10-16: 500 mL via INTRAVENOUS

## 2021-10-16 MED ORDER — HEPARIN SOD (PORK) LOCK FLUSH 100 UNIT/ML IV SOLN
INTRAVENOUS | Status: AC
  Filled 2021-10-16: qty 5

## 2021-10-16 MED ORDER — CEFAZOLIN SODIUM-DEXTROSE 2-4 GM/100ML-% IV SOLN
2.0000 g | INTRAVENOUS | Status: AC
  Administered 2021-10-16: 2 g via INTRAVENOUS

## 2021-10-16 MED ORDER — CHLORHEXIDINE GLUCONATE CLOTH 2 % EX PADS: 6.0000 | MEDICATED_PAD | Freq: Once | CUTANEOUS | Status: DC

## 2021-10-16 MED ORDER — EPHEDRINE 5 MG/ML INJ
INTRAVENOUS | Status: AC
  Filled 2021-10-16: qty 5

## 2021-10-16 MED ORDER — EPHEDRINE SULFATE-NACL 50-0.9 MG/10ML-% IV SOSY
PREFILLED_SYRINGE | INTRAVENOUS | Status: DC | PRN
  Administered 2021-10-16 (×2): 5 mg via INTRAVENOUS

## 2021-10-16 MED ORDER — LIDOCAINE 2% (20 MG/ML) 5 ML SYRINGE
INTRAMUSCULAR | Status: DC | PRN
  Administered 2021-10-16: 60 mg via INTRAVENOUS

## 2021-10-16 MED ORDER — ONDANSETRON HCL 4 MG/2ML IJ SOLN
INTRAMUSCULAR | Status: AC
  Filled 2021-10-16: qty 2

## 2021-10-16 MED ORDER — FENTANYL CITRATE (PF) 100 MCG/2ML IJ SOLN
INTRAMUSCULAR | Status: DC | PRN
  Administered 2021-10-16: 50 ug via INTRAVENOUS

## 2021-10-16 MED ORDER — LACTATED RINGERS IV SOLN
INTRAVENOUS | Status: DC
  Administered 2021-10-16: 1000 mL via INTRAVENOUS

## 2021-10-16 MED ORDER — LIDOCAINE HCL (PF) 1 % IJ SOLN
INTRAMUSCULAR | Status: DC | PRN
  Administered 2021-10-16: 6 mL

## 2021-10-16 MED ORDER — DEXAMETHASONE SODIUM PHOSPHATE 10 MG/ML IJ SOLN
INTRAMUSCULAR | Status: AC
Start: 2021-10-16 — End: ?
  Filled 2021-10-16: qty 1

## 2021-10-16 MED ORDER — CHLORHEXIDINE GLUCONATE 0.12 % MT SOLN
15.0000 mL | Freq: Once | OROMUCOSAL | Status: AC
  Administered 2021-10-16: 15 mL via OROMUCOSAL

## 2021-10-16 MED ORDER — DEXAMETHASONE SODIUM PHOSPHATE 4 MG/ML IJ SOLN
INTRAMUSCULAR | Status: DC | PRN
Start: 2021-10-16 — End: 2021-10-16
  Administered 2021-10-16: 4 mg via INTRAVENOUS

## 2021-10-16 MED ORDER — ACETAMINOPHEN 325 MG PO TABS
650.0000 mg | ORAL_TABLET | Freq: Once | ORAL | Status: AC
  Administered 2021-10-16: 650 mg via ORAL

## 2021-10-16 MED ORDER — DIPHENHYDRAMINE HCL 25 MG PO CAPS
25.0000 mg | ORAL_CAPSULE | Freq: Once | ORAL | Status: AC
  Administered 2021-10-16: 25 mg via ORAL

## 2021-10-16 MED ORDER — LIDOCAINE HCL (PF) 2 % IJ SOLN
INTRAMUSCULAR | Status: AC
  Filled 2021-10-16: qty 5

## 2021-10-16 MED ORDER — MIDAZOLAM HCL 2 MG/2ML IJ SOLN
INTRAMUSCULAR | Status: AC
Start: 2021-10-16 — End: ?
  Filled 2021-10-16: qty 2

## 2021-10-16 MED ORDER — PROPOFOL 10 MG/ML IV BOLUS
INTRAVENOUS | Status: DC | PRN
  Administered 2021-10-16: 30 mg via INTRAVENOUS
  Administered 2021-10-16: 40 mg via INTRAVENOUS
  Administered 2021-10-16: 20 mg via INTRAVENOUS

## 2021-10-16 SURGICAL SUPPLY — 29 items
ADH SKN CLS APL DERMABOND .7 (GAUZE/BANDAGES/DRESSINGS) ×1
APL PRP STRL LF ISPRP CHG 10.5 (MISCELLANEOUS) ×1
APPLICATOR CHLORAPREP 10.5 ORG (MISCELLANEOUS) ×3 IMPLANT
BAG DECANTER FOR FLEXI CONT (MISCELLANEOUS) ×3 IMPLANT
CLOTH BEACON ORANGE TIMEOUT ST (SAFETY) ×3 IMPLANT
COVER LIGHT HANDLE STERIS (MISCELLANEOUS) ×6 IMPLANT
DECANTER SPIKE VIAL GLASS SM (MISCELLANEOUS) ×3 IMPLANT
DERMABOND ADVANCED (GAUZE/BANDAGES/DRESSINGS) ×2
DERMABOND ADVANCED .7 DNX12 (GAUZE/BANDAGES/DRESSINGS) ×1 IMPLANT
DRAPE C-ARM FOLDED MOBILE STRL (DRAPES) ×3 IMPLANT
ELECT REM PT RETURN 9FT ADLT (ELECTROSURGICAL) ×3 IMPLANT
ELECTRODE REM PT RTRN 9FT ADLT (ELECTROSURGICAL) ×1 IMPLANT
GLOVE SURG POLYISO LF SZ7.5 (GLOVE) ×3 IMPLANT
GLOVE SURG UNDER POLY LF SZ7 (GLOVE) ×6 IMPLANT
GOWN STRL REUS W/TWL LRG LVL3 (GOWN DISPOSABLE) ×6 IMPLANT
IV NS 500ML (IV SOLUTION) ×3
IV NS 500ML BAXH (IV SOLUTION) ×1 IMPLANT
KIT PORT POWER 8FR ISP MRI (Port) ×3 IMPLANT
KIT TURNOVER KIT A (KITS) ×3 IMPLANT
NDL HYPO 25X1 1.5 SAFETY (NEEDLE) ×1 IMPLANT
NEEDLE HYPO 25X1 1.5 SAFETY (NEEDLE) ×3 IMPLANT
PACK MINOR (CUSTOM PROCEDURE TRAY) ×3 IMPLANT
PAD ARMBOARD 7.5X6 YLW CONV (MISCELLANEOUS) ×3 IMPLANT
SET BASIN LINEN APH (SET/KITS/TRAYS/PACK) ×3 IMPLANT
SUT MNCRL AB 4-0 PS2 18 (SUTURE) ×3 IMPLANT
SUT VIC AB 3-0 SH 27 (SUTURE) ×3
SUT VIC AB 3-0 SH 27X BRD (SUTURE) ×1 IMPLANT
SYR 5ML LL (SYRINGE) ×3 IMPLANT
SYR CONTROL 10ML LL (SYRINGE) ×3 IMPLANT

## 2021-10-16 NOTE — Progress Notes (Signed)
Patient tolerated transfusion with no complaints voiced.  Side effects with management reviewed with understanding verbalized.  Peripheral IV site clean and dry with no bruising or swelling noted at site.  Good blood return noted before and after administration.  Patient left in satisfactory condition with VSS and no s/s of distress noted.   ?

## 2021-10-16 NOTE — Anesthesia Preprocedure Evaluation (Signed)
Anesthesia Evaluation  ?Patient identified by MRN, date of birth, ID band ?Patient awake ? ? ? ?Reviewed: ?Allergy & Precautions, H&P , NPO status , Patient's Chart, lab work & pertinent test results, reviewed documented beta blocker date and time  ? ?Airway ?Mallampati: II ? ?TM Distance: >3 FB ?Neck ROM: full ? ? ? Dental ?no notable dental hx. ? ?  ?Pulmonary ?neg pulmonary ROS, former smoker,  ?  ?Pulmonary exam normal ?breath sounds clear to auscultation ? ? ? ? ? ? Cardiovascular ?Exercise Tolerance: Good ?hypertension, negative cardio ROS ? ? ?Rhythm:regular Rate:Normal ? ? ?  ?Neuro/Psych ?negative neurological ROS ? negative psych ROS  ? GI/Hepatic ?negative GI ROS, Neg liver ROS,   ?Endo/Other  ?negative endocrine ROS ? Renal/GU ?negative Renal ROS  ?negative genitourinary ?  ?Musculoskeletal ? ? Abdominal ?  ?Peds ? Hematology ? ?(+) Blood dyscrasia, anemia ,   ?Anesthesia Other Findings ?Received PLT this AM ? Reproductive/Obstetrics ?negative OB ROS ? ?  ? ? ? ? ? ? ? ? ? ? ? ? ? ?  ?  ? ? ? ? ? ? ? ? ?Anesthesia Physical ?Anesthesia Plan ? ?ASA: 3 ? ?Anesthesia Plan: General  ? ?Post-op Pain Management:   ? ?Induction:  ? ?PONV Risk Score and Plan: Propofol infusion ? ?Airway Management Planned:  ? ?Additional Equipment:  ? ?Intra-op Plan:  ? ?Post-operative Plan:  ? ?Informed Consent: I have reviewed the patients History and Physical, chart, labs and discussed the procedure including the risks, benefits and alternatives for the proposed anesthesia with the patient or authorized representative who has indicated his/her understanding and acceptance.  ? ? ? ?Dental Advisory Given ? ?Plan Discussed with: CRNA ? ?Anesthesia Plan Comments:   ? ? ? ? ? ? ?Anesthesia Quick Evaluation ? ?

## 2021-10-16 NOTE — Transfer of Care (Signed)
Immediate Anesthesia Transfer of Care Note ? ?Patient: Dustin Tucker ? ?Procedure(s) Performed: INSERTION PORT-A-CATH (Right: Chest) ? ?Patient Location: PACU ? ?Anesthesia Type:General ? ?Level of Consciousness: awake, alert  and oriented ? ?Airway & Oxygen Therapy: Patient Spontanous Breathing ? ?Post-op Assessment: Report given to RN and Post -op Vital signs reviewed and stable ? ?Post vital signs: Reviewed and stable ? ?Last Vitals:  ?Vitals Value Taken Time  ?BP    ?Temp    ?Pulse    ?Resp    ?SpO2    ? ? ?Last Pain:  ?Vitals:  ? 10/16/21 0959  ?TempSrc: Oral  ?PainSc: 0-No pain  ?   ? ?Patients Stated Pain Goal: 6 (10/16/21 0959) ? ?Complications: No notable events documented. ?

## 2021-10-16 NOTE — Patient Instructions (Signed)
[facility]  Discharge Instructions: Thank you for choosing [facility] to provide your oncology and hematology care.  If you have a lab appointment with the Cancer Center, please come in thru the Main Entrance and check in at the main information desk.  Wear comfortable clothing and clothing appropriate for easy access to any Portacath or PICC line.   We strive to give you quality time with your provider. You may need to reschedule your appointment if you arrive late (15 or more minutes).  Arriving late affects you and other patients whose appointments are after yours.  Also, if you miss three or more appointments without notifying the office, you may be dismissed from the clinic at the provider's discretion.      For prescription refill requests, have your pharmacy contact our office and allow 72 hours for refills to be completed.        To help prevent nausea and vomiting after your treatment, we encourage you to take your nausea medication as directed.  BELOW ARE SYMPTOMS THAT SHOULD BE REPORTED IMMEDIATELY: *FEVER GREATER THAN 100.4 F (38 C) OR HIGHER *CHILLS OR SWEATING *NAUSEA AND VOMITING THAT IS NOT CONTROLLED WITH YOUR NAUSEA MEDICATION *UNUSUAL SHORTNESS OF BREATH *UNUSUAL BRUISING OR BLEEDING *URINARY PROBLEMS (pain or burning when urinating, or frequent urination) *BOWEL PROBLEMS (unusual diarrhea, constipation, pain near the anus) TENDERNESS IN MOUTH AND THROAT WITH OR WITHOUT PRESENCE OF ULCERS (sore throat, sores in mouth, or a toothache) UNUSUAL RASH, SWELLING OR PAIN  UNUSUAL VAGINAL DISCHARGE OR ITCHING   Items with * indicate a potential emergency and should be followed up as soon as possible or go to the Emergency Department if any problems should occur.  Please show the CHEMOTHERAPY ALERT CARD or IMMUNOTHERAPY ALERT CARD at check-in to the Emergency Department and triage nurse.  Should you have questions after your visit or need to cancel  or reschedule your appointment, please contact  CANCER CENTER [phone]  and follow the prompts.  Office hours are 8:00 a.m. to 4:30 p.m. Monday - Friday. Please note that voicemails left after 4:00 p.m. may not be returned until the following business day.  We are closed weekends and major holidays. You have access to a nurse at all times for urgent questions. Please call the main number to the clinic [phone] and follow the prompts.  For any non-urgent questions, you may also contact your provider using MyChart. We now offer e-Visits for anyone 18 and older to request care online for non-urgent symptoms. For details visit [email or web address].   Also download the MyChart app! Go to the app store, search "MyChart", open the app, select [facility], and log in with your MyChart username and password.  Due to Covid, a mask is required upon entering the hospital/clinic. If you do not have a mask, one will be given to you upon arrival. For doctor visits, patients may have 1 support person aged 18 or older with them. For treatment visits, patients cannot have anyone with them due to current Covid guidelines and our immunocompromised population.  

## 2021-10-16 NOTE — Op Note (Signed)
Patient:  Dustin Tucker ? ?DOB:  04-Mar-1932 ? ?MRN:  [id number] ? ? ?Preop Diagnosis: Myelodysplastic syndrome, thrombocytopenia, need for central venous access ? ?Postop Diagnosis: Same ? ?Procedure: Port-A-Cath insertion ? ?Surgeon: Aviva Signs, MD ? ?Anes: MAC ? ?Indications: Patient is an 88 year old black male with myelodysplastic syndrome needs central venous access for frequent treatment and transfusions.  The risks and benefits of the procedure including bleeding, infection, pneumothorax were fully explained to the patient, who gave informed consent. ? ?Procedure note: The patient was placed in the supine position.  He did receive a pack of platelets and oncology prior to coming to the operating room.  After monitored anesthesia care was given, the right upper chest was prepped and draped using the usual sterile technique with ChloraPrep.  Surgical site confirmation was performed.  1% Xylocaine was used for local anesthesia. ? ?An incision was made below the right clavicle.  A subcutaneous pocket was formed.  A needle was advanced into the right subclavian vein using the Seldinger technique without difficulty.  The guidewire was then advanced into the right atrium under fluoroscopic guidance.  An introducer and peel-away sheath were placed over the guidewire.  The catheter was inserted through the peel-away sheath and the peel-away sheath was removed.  The catheter was then attached to the port and the port placed in subcutaneous pocket.  Adequate positioning was confirmed by fluoroscopy.  Good backflow of venous blood was noted on aspiration of the port.  The port was flushed with heparin flush.  Subcutaneous layer was reapproximated using a 3-0 Vicryl interrupted suture.  The skin was closed using a 4-0 Monocryl subcuticular suture.  Dermabond was applied. ? ?All tape and needle counts were correct at the end of the procedure.  The patient was awakened and transferred to PACU in stable condition.  A chest  x-ray will be performed at that time. ? ? ? ?Complications: None ? ?EBL: Minimal ? ?Specimen: None ? ? ?  ?

## 2021-10-16 NOTE — Interval H&P Note (Signed)
History and Physical Interval Note: ? ?10/16/2021 ?10:21 AM ? ?Dustin Tucker  has presented today for surgery, with the diagnosis of Chronic myelomonocytic leukemia  ?Myelodysplastic syndrome.  The various methods of treatment have been discussed with the patient and family. After consideration of risks, benefits and other options for treatment, the patient has consented to  Procedure(s) with comments: ?INSERTION PORT-A-CATH (Left) - pt to receive platelets at 7:30 day of procedure as a surgical intervention.  The patient's history has been reviewed, patient examined, no change in status, stable for surgery.  I have reviewed the patient's chart and labs.  Questions were answered to the patient's satisfaction.   ? ? ?Aviva Signs ? ? ?

## 2021-10-16 NOTE — Anesthesia Procedure Notes (Addendum)
Date/Time: 10/16/2021 10:50 AM ?Performed by: Orlie Dakin, CRNA ?Pre-anesthesia Checklist: Patient identified, Emergency Drugs available, Suction available and Patient being monitored ?Patient Re-evaluated:Patient Re-evaluated prior to induction ?Oxygen Delivery Method: Non-rebreather mask ?Induction Type: IV induction ?Placement Confirmation: positive ETCO2 ? ? ? ? ?

## 2021-10-18 LAB — PREPARE PLATELET PHERESIS: Unit division: 0

## 2021-10-18 LAB — BPAM PLATELET PHERESIS
Blood Product Expiration Date: 202303112359
ISSUE DATE / TIME: 202303100748
Unit Type and Rh: 1700

## 2021-10-19 ENCOUNTER — Inpatient Hospital Stay (HOSPITAL_COMMUNITY): Payer: Medicare HMO

## 2021-10-19 ENCOUNTER — Other Ambulatory Visit: Payer: Self-pay

## 2021-10-19 ENCOUNTER — Encounter (HOSPITAL_COMMUNITY): Payer: Self-pay | Admitting: General Surgery

## 2021-10-19 ENCOUNTER — Inpatient Hospital Stay (HOSPITAL_BASED_OUTPATIENT_CLINIC_OR_DEPARTMENT_OTHER): Payer: Medicare HMO | Admitting: Hematology

## 2021-10-19 VITALS — BP 103/55 | HR 67 | Temp 97.3°F | Resp 18

## 2021-10-19 VITALS — BP 140/68 | HR 76 | Temp 97.3°F | Resp 18 | Ht 66.0 in | Wt 161.5 lb

## 2021-10-19 DIAGNOSIS — Z95828 Presence of other vascular implants and grafts: Secondary | ICD-10-CM

## 2021-10-19 DIAGNOSIS — D469 Myelodysplastic syndrome, unspecified: Secondary | ICD-10-CM

## 2021-10-19 DIAGNOSIS — Z5111 Encounter for antineoplastic chemotherapy: Secondary | ICD-10-CM | POA: Diagnosis not present

## 2021-10-19 DIAGNOSIS — C9310 Chronic myelomonocytic leukemia not having achieved remission: Secondary | ICD-10-CM

## 2021-10-19 LAB — COMPREHENSIVE METABOLIC PANEL
ALT: 11 U/L (ref 0–44)
AST: 19 U/L (ref 15–41)
Albumin: 3.8 g/dL (ref 3.5–5.0)
Alkaline Phosphatase: 44 U/L (ref 38–126)
Anion gap: 8 (ref 5–15)
BUN: 27 mg/dL — ABNORMAL HIGH (ref 8–23)
CO2: 24 mmol/L (ref 22–32)
Calcium: 9 mg/dL (ref 8.9–10.3)
Chloride: 107 mmol/L (ref 98–111)
Creatinine, Ser: 1.31 mg/dL — ABNORMAL HIGH (ref 0.61–1.24)
GFR, Estimated: 53 mL/min — ABNORMAL LOW (ref >60)
Glucose, Bld: 100 mg/dL — ABNORMAL HIGH (ref 70–99)
Potassium: 4 mmol/L (ref 3.5–5.1)
Sodium: 139 mmol/L (ref 135–145)
Total Bilirubin: 0.4 mg/dL (ref 0.3–1.2)
Total Protein: 6.6 g/dL (ref 6.5–8.1)

## 2021-10-19 LAB — CBC WITH DIFFERENTIAL/PLATELET
Abs Immature Granulocytes: 0.15 10*3/uL — ABNORMAL HIGH (ref 0.00–0.07)
Basophils Absolute: 0 10*3/uL (ref 0.0–0.1)
Basophils Relative: 0 %
Eosinophils Absolute: 0 10*3/uL (ref 0.0–0.5)
Eosinophils Relative: 0 %
HCT: 21.5 % — ABNORMAL LOW (ref 39.0–52.0)
Hemoglobin: 7.1 g/dL — ABNORMAL LOW (ref 13.0–17.0)
Immature Granulocytes: 2 %
Lymphocytes Relative: 39 %
Lymphs Abs: 3.3 10*3/uL (ref 0.7–4.0)
MCH: 31 pg (ref 26.0–34.0)
MCHC: 33 g/dL (ref 30.0–36.0)
MCV: 93.9 fL (ref 80.0–100.0)
Monocytes Absolute: 3.5 10*3/uL — ABNORMAL HIGH (ref 0.1–1.0)
Monocytes Relative: 42 %
Neutro Abs: 1.5 10*3/uL — ABNORMAL LOW (ref 1.7–7.7)
Neutrophils Relative %: 17 %
Platelets: 50 10*3/uL — ABNORMAL LOW (ref 150–400)
RBC: 2.29 MIL/uL — ABNORMAL LOW (ref 4.22–5.81)
RDW: 20.4 % — ABNORMAL HIGH (ref 11.5–15.5)
WBC: 8.5 10*3/uL (ref 4.0–10.5)
nRBC: 0.8 % — ABNORMAL HIGH (ref 0.0–0.2)

## 2021-10-19 LAB — SAMPLE TO BLOOD BANK

## 2021-10-19 LAB — LACTATE DEHYDROGENASE: LDH: 163 U/L (ref 98–192)

## 2021-10-19 LAB — MAGNESIUM: Magnesium: 2.2 mg/dL (ref 1.7–2.4)

## 2021-10-19 LAB — PREPARE RBC (CROSSMATCH)

## 2021-10-19 MED ORDER — SODIUM CHLORIDE 0.9 % IV SOLN: Freq: Once | INTRAVENOUS | Status: AC

## 2021-10-19 MED ORDER — HEPARIN SOD (PORK) LOCK FLUSH 100 UNIT/ML IV SOLN
500.0000 [IU] | Freq: Once | INTRAVENOUS | Status: AC | PRN
  Administered 2021-10-19: 500 [IU]

## 2021-10-19 MED ORDER — PALONOSETRON HCL INJECTION 0.25 MG/5ML
0.2500 mg | Freq: Once | INTRAVENOUS | Status: AC
  Administered 2021-10-19: 0.25 mg via INTRAVENOUS
  Filled 2021-10-19: qty 5

## 2021-10-19 MED ORDER — SODIUM CHLORIDE 0.9 % IV SOLN
53.0000 mg/m2 | Freq: Once | INTRAVENOUS | Status: AC
  Administered 2021-10-19: 100 mg via INTRAVENOUS
  Filled 2021-10-19: qty 10

## 2021-10-19 MED ORDER — SODIUM CHLORIDE 0.9 % IV SOLN
10.0000 mg | Freq: Once | INTRAVENOUS | Status: AC
  Administered 2021-10-19: 10 mg via INTRAVENOUS
  Filled 2021-10-19: qty 10

## 2021-10-19 MED ORDER — SODIUM CHLORIDE 0.9% FLUSH
10.0000 mL | INTRAVENOUS | Status: DC | PRN
  Administered 2021-10-19: 10 mL

## 2021-10-19 NOTE — Progress Notes (Signed)
Dustin Tucker, [place] [id number]   CLINIC:  Medical Oncology/Hematology  PCP:  Jani Gravel, MD [address] [place] / [place] [address] [phone]   REASON FOR VISIT:  Follow-up for MDS/CMML  PRIOR THERAPY: none  CURRENT THERAPY: Azacitidine IV D1-7 q28d  BRIEF ONCOLOGIC HISTORY:  Oncology History  Chronic myelomonocytic leukemia not having achieved remission ([place])  10/01/2021 Initial Diagnosis   Chronic myelomonocytic leukemia not having achieved remission ([place])   10/19/2021 -  Chemotherapy   Patient is on Treatment Plan : MYELODYSPLASIA  Azacitidine IV D1-7 q28d       CANCER STAGING:  Cancer Staging  No matching staging information was found for the patient.  INTERVAL HISTORY:  Mr. CRUZE ZINGARO, a 88 y.o. male, returns for routine follow-up and consideration for next cycle of chemotherapy. Fredderick was last seen on 10/01/2021.  Due for cycle #1 of Azacitidine today.   Overall, he tells me he has been feeling pretty well. He denies fatigue and nosebleeds.   Overall, he feels ready for next cycle of chemo today.   REVIEW OF SYSTEMS:  Review of Systems  Constitutional:  Negative for appetite change and fatigue.  HENT:   Negative for nosebleeds.   All other systems reviewed and are negative.  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   Hypertension    Port-A-Cath in place 10/15/2021   Past Surgical History:  Procedure Laterality Date   CARPAL TUNNEL RELEASE Right    COLONOSCOPY N/A 09/19/2013   Procedure: COLONOSCOPY;  Surgeon: Rogene Houston, MD;  Location: [facility];  Service: Endoscopy;  Laterality: N/A;  830   FINGER AMPUTATION Left    4th and 5th   left eye blindness Left    SVT ABLATION N/A 09/21/2018   Procedure: SVT ABLATION;  Surgeon: Constance Haw, MD;  Location: [facility];  Service:  Cardiovascular;  Laterality: N/A;    SOCIAL HISTORY:  Social History   Socioeconomic History   Marital status: Widowed    Spouse name: Not on file   Number of children: Not on file   Years of education: Not on file   Highest education level: Not on file  Occupational History   Not on file  Tobacco Use   Smoking status: Former    Packs/day: 0.50    Types: Cigarettes    Start date: 08/09/1956    Quit date: 08/10/1979    Years since quitting: 42.2   Smokeless tobacco: Never  Vaping Use   Vaping Use: Never used  Substance and Sexual Activity   Alcohol use: No    Alcohol/week: 0.0 standard drinks   Drug use: No   Sexual activity: Not on file  Other Topics Concern   Not on file  Social History Narrative   Not on file   Social Determinants of Health   Financial Resource Strain: Not on file  Food Insecurity: Not on file  Transportation Needs: Not on file  Physical Activity: Not on file  Stress: Not on file  Social Connections: Not on file  Intimate Partner Violence: Not on file    FAMILY HISTORY:  Family History  Problem Relation Age of Onset   CAD Mother    CAD Father    Cancer Brother     CURRENT MEDICATIONS:  Current Outpatient Medications  Medication Sig Dispense Refill   albuterol (VENTOLIN HFA) 108 (90 Base) MCG/ACT inhaler Inhale 2 puffs into the lungs every 6 (six) hours as needed for wheezing or shortness of breath.     azaCITIDine 5 mg/2 mLs in lactated ringers infusion Inject 75 mg/m2 into the vein daily. Days 1-5 every 28 days     cholecalciferol (VITAMIN D) 1000 units tablet Take 1,000 Units by mouth daily.     cloNIDine (CATAPRES) 0.1 MG tablet Take 0.1 mg by mouth daily.     hydrochlorothiazide (HYDRODIURIL) 25 MG tablet Take 25 mg by mouth daily.      levocetirizine (XYZAL) 5 MG tablet Take 5 mg by mouth daily.     lidocaine-prilocaine (EMLA) cream Apply a dime-sized amount to port a cath site (do not rub in) and cover with plastic wrap one hour prior  to infusion appointments 30 g 3   lisinopril (PRINIVIL,ZESTRIL) 40 MG tablet Take 40 mg by mouth daily.      pantoprazole (PROTONIX) 40 MG tablet Take 1 tablet (40 mg total) by mouth daily. 30 tablet 1   prochlorperazine (COMPAZINE) 10 MG tablet Take 1 tablet (10 mg total) by mouth every 6 (six) hours as needed (Nausea or vomiting). 30 tablet 1   vitamin B-12 (CYANOCOBALAMIN) 1000 MCG tablet Take 1,000 mcg by mouth daily.     No current facility-administered medications for this visit.    ALLERGIES:  Allergies  Allergen Reactions   Aspirin Other (See Comments)    Gi upset, can tolerate baby aspirin   Flomax [Tamsulosin] Nausea Only    PHYSICAL EXAM:  Performance status (ECOG): 1 - Symptomatic but completely ambulatory  Vitals:   10/19/21 0820  BP: 140/68  Pulse: 76  Resp: 18  Temp: (!) 97.3 F (36.3 C)  SpO2: 99%   Wt Readings from Last 3 Encounters:  10/19/21 161 lb 8 oz (73.3 kg)  10/16/21 160 lb 15 oz (73 kg)  10/14/21 161 lb (73 kg)   Physical Exam Vitals reviewed.  Constitutional:      Appearance: Normal appearance.  Cardiovascular:     Rate and Rhythm: Normal rate and regular rhythm.     Pulses: Normal pulses.     Heart sounds: Normal heart sounds.  Pulmonary:     Effort: Pulmonary effort is normal.     Breath sounds: Normal breath sounds.  Chest:     Comments: Port-a-cath on R side Neurological:     General: No focal deficit present.     Mental Status: He is alert and oriented to person, place, and time.  Psychiatric:        Mood and Affect: Mood normal.        Behavior: Behavior normal.    LABORATORY DATA:  I have reviewed the labs as listed.  CBC Latest Ref Rng & Units 10/19/2021 10/15/2021 10/08/2021  WBC 4.0 - 10.5 K/uL 8.5 8.7 10.2  Hemoglobin 13.0 - 17.0 g/dL 7.1(L) 8.2(L) 8.5(L)  Hematocrit 39.0 - 52.0 % 21.5(L) 25.5(L) 27.1(L)  Platelets 150 - 400 K/uL 50(L) 44(L) 38(L)   CMP Latest Ref Rng & Units 10/19/2021 10/01/2021 08/08/2021  Glucose 70 -  99 mg/dL 100(H) 111(H) 104(H)  BUN 8 - 23 mg/dL 27(H) 34(H) 29(H)  Creatinine 0.61 - 1.24 mg/dL 1.31(H) 1.49(H) 1.22  Sodium 135 - 145 mmol/L 139 137 139  Potassium 3.5 - 5.1 mmol/L 4.0 4.2 4.1  Chloride 98 - 111 mmol/L 107 105 106  CO2 22 - 32 mmol/L 24 24  25  Calcium 8.9 - 10.3 mg/dL 9.0 9.1 8.6(L)  Total Protein 6.5 - 8.1 g/dL 6.6 7.2 5.8(L)  Total Bilirubin 0.3 - 1.2 mg/dL 0.4 0.9 0.9  Alkaline Phos 38 - 126 U/L 44 46 44  AST 15 - 41 U/L 19 17 14(L)  ALT 0 - 44 U/L 11 14 11     DIAGNOSTIC IMAGING:  I have independently reviewed the scans and discussed with the patient. DG Chest Port 1 View  Result Date: 10/16/2021 CLINICAL DATA:  Status post Port-A-Cath placement. EXAM: PORTABLE CHEST 1 VIEW COMPARISON:  August 06, 2021. FINDINGS: The heart size and mediastinal contours are within normal limits. Both lungs are clear. Interval placement of right subclavian Port-A-Cath with distal tip in expected position of the SVC. No pneumothorax is noted. The visualized skeletal structures are unremarkable. IMPRESSION: Interval placement of right subclavian Port-A-Cath with distal tip in expected position of the SVC. Electronically Signed   By: Marijo Conception M.D.   On: 10/16/2021 11:43   DG C-Arm 1-60 Min-No Report  Result Date: 10/16/2021 Fluoroscopy was utilized by the requesting physician.  No radiographic interpretation.     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  Hemoglobin 15.5 on 09/20/2018.  Platelet count on 09/20/1998 2155. - He was on aspirin 81 mg at presentation.  He received 3 units PRBC.  Ferritin was 244 and percent saturation 37.  D53 was 299 and folic acid normal.  Creatinine was normal. - MMA, copper, folic acid and LDH was normal.  SPEP negative. - Serum EPO 140.  Kappa light chains elevated at 32.4 with ratio 1.55. -  Bone marrow biopsy on 09/18/2021: Hypercellular marrow with trilineage dyspoiesis and monocytosis.  Dyspoiesis in the granulocytic precursors without apparent late maturation of neutrophils, increased monocytes but no significant blast population.  Increased megakaryocytes with widely separated nuclear lobes.  Erythroid precursors slightly decreased in number with the type ER.  Ring sideroblasts not identified. - Chromosome analysis and MDS FISH panel was normal. 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 - Based on [facility] molecular model, he has 3 points with circulating immature cells, decreased hemoglobin, decreased platelets.  This puts him in high risk, with overall survival 16 months. - Azacitidine started on 10/19/2021.    Social/family history: - Lives at home by himself.  Son lives in [place].  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  No exposure to chemicals.  Non-smoker. - Brother had colon cancer.  Another brother had?  Stomach cancer.  2 maternal uncles had cancer, type unknown to the patient.   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 - Because of his transfusion dependency, we have recommended treatment of CMML.  I have recommended HMA based therapy. - We discussed azacitidine and its side effects.  We will start him at low-dose of 50 mg/m2 for 5 days and increase it as tolerated. - Reviewed his labs today which showed hemoglobin 7.1 and platelet count 50,000.  LDH was normal.  LFTs were normal.  He has CKD with creatinine 1.31 and stable. - Proceed with cycle 1 today of azacitidine for 5 days.  We will transfuse him 1 unit PRBC.  We will check CBC every week.  RTC 6 4 weeks for follow-up and  next cycle.   Orders placed this encounter:  No orders of the defined types were placed in this encounter.    Derek Jack, MD [place] [phone]   I, Thana Ates, am acting as a scribe for Dr. Derek Jack.  I, Derek Jack MD, have reviewed the above documentation for accuracy and completeness, and I agree with the above.

## 2021-10-19 NOTE — Anesthesia Postprocedure Evaluation (Signed)
Anesthesia Post Note ? ?Patient: Dustin Tucker ? ?Procedure(s) Performed: INSERTION PORT-A-CATH (Right: Chest) ? ?Patient location during evaluation: Phase II ?Anesthesia Type: General ?Level of consciousness: awake ?Pain management: pain level controlled ?Vital Signs Assessment: post-procedure vital signs reviewed and stable ?Respiratory status: spontaneous breathing and respiratory function stable ?Cardiovascular status: blood pressure returned to baseline and stable ?Postop Assessment: no headache and no apparent nausea or vomiting ?Anesthetic complications: no ?Comments: Late entry ? ? ?No notable events documented. ? ? ?Last Vitals:  ?Vitals:  ? 10/16/21 1145 10/16/21 1157  ?BP: 137/69 (!) 125/58  ?Pulse:  (!) 55  ?Resp: 13 17  ?Temp:  36.4 ?C  ?SpO2: 100% 99%  ?  ?Last Pain:  ?Vitals:  ? 10/16/21 1157  ?TempSrc: Oral  ?PainSc: 0-No pain  ? ? ?  ?  ?  ?  ?  ?  ? ?Louann Sjogren ? ? ? ? ?

## 2021-10-19 NOTE — Patient Instructions (Signed)
[place] at [facility] ?Discharge Instructions ? ? ?You were seen and examined today by Dr. Delton Coombes. ? ?He reviewed your lab work which is stable.  We will give you a unit of blood this week. ? ?We will proceed with your treatment today and daily through Friday. ? ?We will check your lab work weekly and give you blood and platelets as needed.  ? ?Return as scheduled.  ? ? ?Thank you for choosing [place] at [facility] to provide your oncology and hematology care.  To afford each patient quality time with our provider, please arrive at least 15 minutes before your scheduled appointment time.  ? ?If you have a lab appointment with the [facility] please come in thru the Main Entrance and check in at the main information desk. ? ?You need to re-schedule your appointment should you arrive 10 or more minutes late.  We strive to give you quality time with our providers, and arriving late affects you and other patients whose appointments are after yours.  Also, if you no show three or more times for appointments you may be dismissed from the clinic at the providers discretion.     ?Again, thank you for choosing [facility].  Our hope is that these requests will decrease the amount of time that you wait before being seen by our physicians.       ?_____________________________________________________________ ? ?Should you have questions after your visit to [facility], please contact our office at [phone] and follow the prompts.  Our office hours are 8:00 a.m. and 4:30 p.m. Monday - Friday.  Please note that voicemails left after 4:00 p.m. may not be returned until the following business day.  We are closed weekends and major holidays.  You do have access to a nurse 24-7, just call the main number to the clinic [phone] and do not press any options, hold on the line and a nurse will answer the phone.   ? ?For prescription refill  requests, have your pharmacy contact our office and allow 72 hours.   ? ?Due to Covid, you will need to wear a mask upon entering the hospital. If you do not have a mask, a mask will be given to you at the Main Entrance upon arrival. For doctor visits, patients may have 1 support person age 86 or older with them. For treatment visits, patients can not have anyone with them due to social distancing guidelines and our immunocompromised population.  ? ?   ?

## 2021-10-19 NOTE — Progress Notes (Signed)
Patient tolerated therapy with no complaints voiced.  Side effects with management reviewed with understanding verbalized.  Port site clean and dry with no complaints at site.  Good blood return noted before and after administration of therapy.  Dressing intact.  Reviewed chemo packet and numbers to call.  Patient verbalized understanding.  Patient left in satisfactory condition with VSS and no s/s of distress noted.   ?

## 2021-10-19 NOTE — Patient Instructions (Signed)
[facility]  Discharge Instructions: Thank you for choosing [facility] to provide your oncology and hematology care.  If you have a lab appointment with the Cancer Center, please come in thru the Main Entrance and check in at the main information desk.  Wear comfortable clothing and clothing appropriate for easy access to any Portacath or PICC line.   We strive to give you quality time with your provider. You may need to reschedule your appointment if you arrive late (15 or more minutes).  Arriving late affects you and other patients whose appointments are after yours.  Also, if you miss three or more appointments without notifying the office, you may be dismissed from the clinic at the provider's discretion.      For prescription refill requests, have your pharmacy contact our office and allow 72 hours for refills to be completed.    Today you received the following chemotherapy and/or immunotherapy agents Vidaza      To help prevent nausea and vomiting after your treatment, we encourage you to take your nausea medication as directed.  BELOW ARE SYMPTOMS THAT SHOULD BE REPORTED IMMEDIATELY: *FEVER GREATER THAN 100.4 F (38 C) OR HIGHER *CHILLS OR SWEATING *NAUSEA AND VOMITING THAT IS NOT CONTROLLED WITH YOUR NAUSEA MEDICATION *UNUSUAL SHORTNESS OF BREATH *UNUSUAL BRUISING OR BLEEDING *URINARY PROBLEMS (pain or burning when urinating, or frequent urination) *BOWEL PROBLEMS (unusual diarrhea, constipation, pain near the anus) TENDERNESS IN MOUTH AND THROAT WITH OR WITHOUT PRESENCE OF ULCERS (sore throat, sores in mouth, or a toothache) UNUSUAL RASH, SWELLING OR PAIN  UNUSUAL VAGINAL DISCHARGE OR ITCHING   Items with * indicate a potential emergency and should be followed up as soon as possible or go to the Emergency Department if any problems should occur.  Please show the CHEMOTHERAPY ALERT CARD or IMMUNOTHERAPY ALERT CARD at check-in to the Emergency  Department and triage nurse.  Should you have questions after your visit or need to cancel or reschedule your appointment, please contact [facility] [phone]  and follow the prompts.  Office hours are 8:00 a.m. to 4:30 p.m. Monday - Friday. Please note that voicemails left after 4:00 p.m. may not be returned until the following business day.  We are closed weekends and major holidays. You have access to a nurse at all times for urgent questions. Please call the main number to the clinic [phone] and follow the prompts.  For any non-urgent questions, you may also contact your provider using MyChart. We now offer e-Visits for anyone 18 and older to request care online for non-urgent symptoms. For details visit [email or web address].   Also download the MyChart app! Go to the app store, search "MyChart", open the app, select [facility], and log in with your MyChart username and password.  Due to Covid, a mask is required upon entering the hospital/clinic. If you do not have a mask, one will be given to you upon arrival. For doctor visits, patients may have 1 support person aged 18 or older with them. For treatment visits, patients cannot have anyone with them due to current Covid guidelines and our immunocompromised population.  

## 2021-10-19 NOTE — Progress Notes (Signed)
Patient has been examined by Dr. Katragadda, and vital signs and labs have been reviewed. ANC, Creatinine, LFTs, hemoglobin, and platelets are within treatment parameters per M.D. - pt may proceed with treatment.    °

## 2021-10-19 NOTE — Progress Notes (Signed)

## 2021-10-20 ENCOUNTER — Other Ambulatory Visit: Payer: Medicare HMO

## 2021-10-20 ENCOUNTER — Inpatient Hospital Stay (HOSPITAL_COMMUNITY): Payer: Medicare HMO

## 2021-10-20 VITALS — BP 156/82 | HR 78 | Temp 97.6°F | Resp 18 | Ht 66.0 in | Wt 164.2 lb

## 2021-10-20 DIAGNOSIS — C9310 Chronic myelomonocytic leukemia not having achieved remission: Secondary | ICD-10-CM

## 2021-10-20 DIAGNOSIS — Z95828 Presence of other vascular implants and grafts: Secondary | ICD-10-CM

## 2021-10-20 DIAGNOSIS — Z5111 Encounter for antineoplastic chemotherapy: Secondary | ICD-10-CM | POA: Diagnosis not present

## 2021-10-20 MED ORDER — SODIUM CHLORIDE 0.9% FLUSH
10.0000 mL | INTRAVENOUS | Status: DC | PRN
  Administered 2021-10-20: 10 mL

## 2021-10-20 MED ORDER — SODIUM CHLORIDE 0.9 % IV SOLN
53.0000 mg/m2 | Freq: Once | INTRAVENOUS | Status: AC
  Administered 2021-10-20: 100 mg via INTRAVENOUS
  Filled 2021-10-20: qty 10

## 2021-10-20 MED ORDER — DIPHENHYDRAMINE HCL 25 MG PO CAPS
25.0000 mg | ORAL_CAPSULE | Freq: Once | ORAL | Status: AC
  Administered 2021-10-20: 25 mg via ORAL
  Filled 2021-10-20: qty 1

## 2021-10-20 MED ORDER — SODIUM CHLORIDE 0.9% IV SOLUTION
250.0000 mL | Freq: Once | INTRAVENOUS | Status: AC
  Administered 2021-10-20: 250 mL via INTRAVENOUS

## 2021-10-20 MED ORDER — ACETAMINOPHEN 325 MG PO TABS
650.0000 mg | ORAL_TABLET | Freq: Once | ORAL | Status: AC
  Administered 2021-10-20: 650 mg via ORAL
  Filled 2021-10-20: qty 2

## 2021-10-20 MED ORDER — SODIUM CHLORIDE 0.9 % IV SOLN
10.0000 mg | Freq: Once | INTRAVENOUS | Status: AC
  Administered 2021-10-20: 10 mg via INTRAVENOUS
  Filled 2021-10-20: qty 1

## 2021-10-20 MED ORDER — SODIUM CHLORIDE 0.9% FLUSH: 10.0000 mL | INTRAVENOUS | Status: DC | PRN

## 2021-10-20 MED ORDER — SODIUM CHLORIDE 0.9 % IV SOLN: Freq: Once | INTRAVENOUS | Status: AC

## 2021-10-20 MED ORDER — HEPARIN SOD (PORK) LOCK FLUSH 100 UNIT/ML IV SOLN
500.0000 [IU] | Freq: Once | INTRAVENOUS | Status: AC | PRN
  Administered 2021-10-20: 500 [IU]

## 2021-10-20 MED ORDER — HEPARIN SOD (PORK) LOCK FLUSH 100 UNIT/ML IV SOLN: 500.0000 [IU] | Freq: Every day | INTRAVENOUS | Status: DC | PRN

## 2021-10-20 NOTE — Patient Instructions (Signed)
[facility]  Discharge Instructions: Thank you for choosing [facility] to provide your oncology and hematology care.  If you have a lab appointment with the Cancer Center, please come in thru the Main Entrance and check in at the main information desk.  Wear comfortable clothing and clothing appropriate for easy access to any Portacath or PICC line.   We strive to give you quality time with your provider. You may need to reschedule your appointment if you arrive late (15 or more minutes).  Arriving late affects you and other patients whose appointments are after yours.  Also, if you miss three or more appointments without notifying the office, you may be dismissed from the clinic at the provider's discretion.      For prescription refill requests, have your pharmacy contact our office and allow 72 hours for refills to be completed.        To help prevent nausea and vomiting after your treatment, we encourage you to take your nausea medication as directed.  BELOW ARE SYMPTOMS THAT SHOULD BE REPORTED IMMEDIATELY: *FEVER GREATER THAN 100.4 F (38 C) OR HIGHER *CHILLS OR SWEATING *NAUSEA AND VOMITING THAT IS NOT CONTROLLED WITH YOUR NAUSEA MEDICATION *UNUSUAL SHORTNESS OF BREATH *UNUSUAL BRUISING OR BLEEDING *URINARY PROBLEMS (pain or burning when urinating, or frequent urination) *BOWEL PROBLEMS (unusual diarrhea, constipation, pain near the anus) TENDERNESS IN MOUTH AND THROAT WITH OR WITHOUT PRESENCE OF ULCERS (sore throat, sores in mouth, or a toothache) UNUSUAL RASH, SWELLING OR PAIN  UNUSUAL VAGINAL DISCHARGE OR ITCHING   Items with * indicate a potential emergency and should be followed up as soon as possible or go to the Emergency Department if any problems should occur.  Please show the CHEMOTHERAPY ALERT CARD or IMMUNOTHERAPY ALERT CARD at check-in to the Emergency Department and triage nurse.  Should you have questions after your visit or need to cancel  or reschedule your appointment, please contact [facility] [phone]  and follow the prompts.  Office hours are 8:00 a.m. to 4:30 p.m. Monday - Friday. Please note that voicemails left after 4:00 p.m. may not be returned until the following business day.  We are closed weekends and major holidays. You have access to a nurse at all times for urgent questions. Please call the main number to the clinic [phone] and follow the prompts.  For any non-urgent questions, you may also contact your provider using MyChart. We now offer e-Visits for anyone 86 and older to request care online for non-urgent symptoms. For details visit [email or web address].   Also download the MyChart app! Go to the app store, search "MyChart", open the app, select [facility], and log in with your MyChart username and password.  Due to Covid, a mask is required upon entering the hospital/clinic. If you do not have a mask, one will be given to you upon arrival. For doctor visits, patients may have 1 support person aged 86 or older with them. For treatment visits, patients cannot have anyone with them due to current Covid guidelines and our immunocompromised population.  

## 2021-10-20 NOTE — Progress Notes (Signed)
Patient presents today for chemotherapy infusion and blood transfusion.  Patient is in satisfactory condition with no complaints voiced.  Vital signs are stable.  Blood noted on port dressing.  Patient was accessed on 10/19/2021.  Antimicrobial disc is in place.  See flow sheet for more details.  Labs drawn on 10/19/2021.  Hemoglobin was 7.1 and platelets were 50.  Patient will receive one unit of PRBC along with treatment per Dr. Delton Coombes.  All other labs are within treatment parameters.  We will proceed with treatment per MD orders.   ? ?Patient tolerated treatment and transfusion well with no complaints voiced.  Patient left ambulatory in stable condition.  Vital signs stable at discharge.  Follow up as scheduled.    ?

## 2021-10-21 ENCOUNTER — Other Ambulatory Visit: Payer: Self-pay

## 2021-10-21 ENCOUNTER — Ambulatory Visit (HOSPITAL_COMMUNITY): Payer: Medicare HMO

## 2021-10-21 ENCOUNTER — Inpatient Hospital Stay (HOSPITAL_COMMUNITY): Payer: Medicare HMO

## 2021-10-21 VITALS — BP 99/58 | HR 71 | Temp 97.4°F | Resp 18

## 2021-10-21 DIAGNOSIS — C9310 Chronic myelomonocytic leukemia not having achieved remission: Secondary | ICD-10-CM

## 2021-10-21 DIAGNOSIS — Z95828 Presence of other vascular implants and grafts: Secondary | ICD-10-CM

## 2021-10-21 DIAGNOSIS — Z5111 Encounter for antineoplastic chemotherapy: Secondary | ICD-10-CM | POA: Diagnosis not present

## 2021-10-21 LAB — TYPE AND SCREEN
ABO/RH(D): B POS
Antibody Screen: NEGATIVE
Unit division: 0

## 2021-10-21 LAB — BPAM RBC
Blood Product Expiration Date: 202303222359
ISSUE DATE / TIME: 202303141043
Unit Type and Rh: 1700

## 2021-10-21 MED ORDER — PALONOSETRON HCL INJECTION 0.25 MG/5ML
0.2500 mg | Freq: Once | INTRAVENOUS | Status: AC
  Administered 2021-10-21: 0.25 mg via INTRAVENOUS
  Filled 2021-10-21: qty 5

## 2021-10-21 MED ORDER — SODIUM CHLORIDE 0.9 % IV SOLN
10.0000 mg | Freq: Once | INTRAVENOUS | Status: AC
  Administered 2021-10-21: 10 mg via INTRAVENOUS
  Filled 2021-10-21: qty 10

## 2021-10-21 MED ORDER — HEPARIN SOD (PORK) LOCK FLUSH 100 UNIT/ML IV SOLN
500.0000 [IU] | Freq: Once | INTRAVENOUS | Status: AC | PRN
  Administered 2021-10-21: 500 [IU]

## 2021-10-21 MED ORDER — SODIUM CHLORIDE 0.9 % IV SOLN: Freq: Once | INTRAVENOUS | Status: AC

## 2021-10-21 MED ORDER — SODIUM CHLORIDE 0.9% FLUSH
10.0000 mL | INTRAVENOUS | Status: DC | PRN
  Administered 2021-10-21: 10 mL

## 2021-10-21 MED ORDER — SODIUM CHLORIDE 0.9 % IV SOLN
53.0000 mg/m2 | Freq: Once | INTRAVENOUS | Status: AC
  Administered 2021-10-21: 100 mg via INTRAVENOUS
  Filled 2021-10-21: qty 10

## 2021-10-21 NOTE — Progress Notes (Signed)
Patient presents today for day 3 Vidaza per providers order.  Vital signs within parameters for treatment.  ? ?Vidaza infusion given today per MD orders.  Stable during infusion without adverse affects.  Vital signs stable.  No complaints at this time.  Discharge from clinic ambulatory in stable condition.  Alert and oriented X 3.  Follow up with [facility] as scheduled.  ?

## 2021-10-21 NOTE — Patient Instructions (Signed)
[facility]  Discharge Instructions: ?Thank you for choosing [facility] to provide your oncology and hematology care.  ?If you have a lab appointment with the [facility], please come in thru the Main Entrance and check in at the main information desk. ? ?Wear comfortable clothing and clothing appropriate for easy access to any Portacath or PICC line.  ? ?We strive to give you quality time with your provider. You may need to reschedule your appointment if you arrive late (15 or more minutes).  Arriving late affects you and other patients whose appointments are after yours.  Also, if you miss three or more appointments without notifying the office, you may be dismissed from the clinic at the provider?s discretion.    ?  ?For prescription refill requests, have your pharmacy contact our office and allow 72 hours for refills to be completed.   ? ?Today you received the following chemotherapy and/or immunotherapy agents vidaza    ?  ?To help prevent nausea and vomiting after your treatment, we encourage you to take your nausea medication as directed. ? ?BELOW ARE SYMPTOMS THAT SHOULD BE REPORTED IMMEDIATELY: ?*FEVER GREATER THAN 100.4 F (38 ?C) OR HIGHER ?*CHILLS OR SWEATING ?*NAUSEA AND VOMITING THAT IS NOT CONTROLLED WITH YOUR NAUSEA MEDICATION ?*UNUSUAL SHORTNESS OF BREATH ?*UNUSUAL BRUISING OR BLEEDING ?*URINARY PROBLEMS (pain or burning when urinating, or frequent urination) ?*BOWEL PROBLEMS (unusual diarrhea, constipation, pain near the anus) ?TENDERNESS IN MOUTH AND THROAT WITH OR WITHOUT PRESENCE OF ULCERS (sore throat, sores in mouth, or a toothache) ?UNUSUAL RASH, SWELLING OR PAIN  ?UNUSUAL VAGINAL DISCHARGE OR ITCHING  ? ?Items with * indicate a potential emergency and should be followed up as soon as possible or go to the Emergency Department if any problems should occur. ? ?Please show the CHEMOTHERAPY ALERT CARD or IMMUNOTHERAPY ALERT CARD at check-in to the Emergency  Department and triage nurse. ? ?Should you have questions after your visit or need to cancel or reschedule your appointment, please contact [facility] [phone]  and follow the prompts.  Office hours are 8:00 a.m. to 4:30 p.m. Monday - Friday. Please note that voicemails left after 4:00 p.m. may not be returned until the following business day.  We are closed weekends and major holidays. You have access to a nurse at all times for urgent questions. Please call the main number to the clinic [phone] and follow the prompts. ? ?For any non-urgent questions, you may also contact your provider using MyChart. We now offer e-Visits for anyone 58 and older to request care online for non-urgent symptoms. For details visit [email or web address]. ?  ?Also download the MyChart app! Go to the app store, search "MyChart", open the app, select [facility] [place], and log in with your MyChart username and password. ? ?Due to Covid, a mask is required upon entering the hospital/clinic. If you do not have a mask, one will be given to you upon arrival. For doctor visits, patients may have 1 support person aged 57 or older with them. For treatment visits, patients cannot have anyone with them due to current Covid guidelines and our immunocompromised population.  ?

## 2021-10-22 ENCOUNTER — Encounter (HOSPITAL_COMMUNITY): Payer: Self-pay

## 2021-10-22 ENCOUNTER — Ambulatory Visit (HOSPITAL_COMMUNITY): Payer: Medicare HMO

## 2021-10-22 ENCOUNTER — Inpatient Hospital Stay (HOSPITAL_COMMUNITY): Payer: Medicare HMO

## 2021-10-22 VITALS — BP 110/57 | HR 65 | Temp 96.7°F | Resp 18

## 2021-10-22 DIAGNOSIS — Z5111 Encounter for antineoplastic chemotherapy: Secondary | ICD-10-CM | POA: Diagnosis not present

## 2021-10-22 MED ORDER — SODIUM CHLORIDE 0.9% FLUSH
10.0000 mL | INTRAVENOUS | Status: DC | PRN
  Administered 2021-10-22 (×2): 10 mL

## 2021-10-22 MED ORDER — SODIUM CHLORIDE 0.9 % IV SOLN: Freq: Once | INTRAVENOUS | Status: AC

## 2021-10-22 MED ORDER — SODIUM CHLORIDE 0.9 % IV SOLN
100.0000 mg | Freq: Once | INTRAVENOUS | Status: AC
  Administered 2021-10-22: 100 mg via INTRAVENOUS
  Filled 2021-10-22: qty 10

## 2021-10-22 MED ORDER — SODIUM CHLORIDE 0.9 % IV SOLN
10.0000 mg | Freq: Once | INTRAVENOUS | Status: AC
  Administered 2021-10-22: 10 mg via INTRAVENOUS
  Filled 2021-10-22: qty 10

## 2021-10-22 MED ORDER — HEPARIN SOD (PORK) LOCK FLUSH 100 UNIT/ML IV SOLN
500.0000 [IU] | Freq: Once | INTRAVENOUS | Status: AC | PRN
  Administered 2021-10-22: 500 [IU]

## 2021-10-22 NOTE — Progress Notes (Signed)
Patient presents today for treatment, Vidaza D4. Patient denies any side effects from treatment. MAR reviewed and updated. Patient has no complaints today.  ?

## 2021-10-22 NOTE — Progress Notes (Signed)
Vidaza infusion given today per MD orders.  Stable during infusion without adverse affects.  Vital signs stable.  No complaints at this time.  Discharge from clinic ambulatory in stable condition.  Alert and oriented X 3.  Follow up with [facility] as scheduled. Discharge from clinic ambulatory in stable condition.  Alert and oriented X 3.  Follow up with [facility] as scheduled.  ?

## 2021-10-22 NOTE — Patient Instructions (Signed)
[facility]  Discharge Instructions: Thank you for choosing [facility] to provide your oncology and hematology care.  If you have a lab appointment with the Cancer Center, please come in thru the Main Entrance and check in at the main information desk.  Wear comfortable clothing and clothing appropriate for easy access to any Portacath or PICC line.   We strive to give you quality time with your provider. You may need to reschedule your appointment if you arrive late (15 or more minutes).  Arriving late affects you and other patients whose appointments are after yours.  Also, if you miss three or more appointments without notifying the office, you may be dismissed from the clinic at the provider's discretion.      For prescription refill requests, have your pharmacy contact our office and allow 72 hours for refills to be completed.    Today you received the following chemotherapy and/or immunotherapy agents Vidaza      To help prevent nausea and vomiting after your treatment, we encourage you to take your nausea medication as directed.  BELOW ARE SYMPTOMS THAT SHOULD BE REPORTED IMMEDIATELY: *FEVER GREATER THAN 100.4 F (38 C) OR HIGHER *CHILLS OR SWEATING *NAUSEA AND VOMITING THAT IS NOT CONTROLLED WITH YOUR NAUSEA MEDICATION *UNUSUAL SHORTNESS OF BREATH *UNUSUAL BRUISING OR BLEEDING *URINARY PROBLEMS (pain or burning when urinating, or frequent urination) *BOWEL PROBLEMS (unusual diarrhea, constipation, pain near the anus) TENDERNESS IN MOUTH AND THROAT WITH OR WITHOUT PRESENCE OF ULCERS (sore throat, sores in mouth, or a toothache) UNUSUAL RASH, SWELLING OR PAIN  UNUSUAL VAGINAL DISCHARGE OR ITCHING   Items with * indicate a potential emergency and should be followed up as soon as possible or go to the Emergency Department if any problems should occur.  Please show the CHEMOTHERAPY ALERT CARD or IMMUNOTHERAPY ALERT CARD at check-in to the Emergency  Department and triage nurse.  Should you have questions after your visit or need to cancel or reschedule your appointment, please contact [facility] [phone]  and follow the prompts.  Office hours are 8:00 a.m. to 4:30 p.m. Monday - Friday. Please note that voicemails left after 4:00 p.m. may not be returned until the following business day.  We are closed weekends and major holidays. You have access to a nurse at all times for urgent questions. Please call the main number to the clinic [phone] and follow the prompts.  For any non-urgent questions, you may also contact your provider using MyChart. We now offer e-Visits for anyone 18 and older to request care online for non-urgent symptoms. For details visit [email or web address].   Also download the MyChart app! Go to the app store, search "MyChart", open the app, select , and log in with your MyChart username and password.  Due to Covid, a mask is required upon entering the hospital/clinic. If you do not have a mask, one will be given to you upon arrival. For doctor visits, patients may have 1 support person aged 18 or older with them. For treatment visits, patients cannot have anyone with them due to current Covid guidelines and our immunocompromised population.  

## 2021-10-23 ENCOUNTER — Other Ambulatory Visit: Payer: Self-pay

## 2021-10-23 ENCOUNTER — Inpatient Hospital Stay (HOSPITAL_COMMUNITY): Payer: Medicare HMO

## 2021-10-23 ENCOUNTER — Ambulatory Visit (HOSPITAL_COMMUNITY): Payer: Medicare HMO

## 2021-10-23 VITALS — BP 160/80 | HR 68 | Temp 96.6°F | Resp 18

## 2021-10-23 DIAGNOSIS — Z95828 Presence of other vascular implants and grafts: Secondary | ICD-10-CM

## 2021-10-23 DIAGNOSIS — C9310 Chronic myelomonocytic leukemia not having achieved remission: Secondary | ICD-10-CM

## 2021-10-23 DIAGNOSIS — Z5111 Encounter for antineoplastic chemotherapy: Secondary | ICD-10-CM | POA: Diagnosis not present

## 2021-10-23 MED ORDER — SODIUM CHLORIDE 0.9% FLUSH
10.0000 mL | INTRAVENOUS | Status: DC | PRN
  Administered 2021-10-23: 10 mL

## 2021-10-23 MED ORDER — SODIUM CHLORIDE 0.9 % IV SOLN
100.0000 mg | Freq: Once | INTRAVENOUS | Status: AC
  Administered 2021-10-23: 100 mg via INTRAVENOUS
  Filled 2021-10-23: qty 10

## 2021-10-23 MED ORDER — PALONOSETRON HCL INJECTION 0.25 MG/5ML
0.2500 mg | Freq: Once | INTRAVENOUS | Status: AC
  Administered 2021-10-23: 0.25 mg via INTRAVENOUS
  Filled 2021-10-23: qty 5

## 2021-10-23 MED ORDER — SODIUM CHLORIDE 0.9 % IV SOLN
10.0000 mg | Freq: Once | INTRAVENOUS | Status: AC
  Administered 2021-10-23: 10 mg via INTRAVENOUS
  Filled 2021-10-23: qty 10

## 2021-10-23 MED ORDER — HEPARIN SOD (PORK) LOCK FLUSH 100 UNIT/ML IV SOLN
500.0000 [IU] | Freq: Once | INTRAVENOUS | Status: AC | PRN
  Administered 2021-10-23: 500 [IU]

## 2021-10-23 MED ORDER — SODIUM CHLORIDE 0.9 % IV SOLN: Freq: Once | INTRAVENOUS | Status: AC

## 2021-10-23 NOTE — Progress Notes (Signed)
Treatment given per orders. Patient tolerated it well without problems. Vitals stable and discharged home from clinic ambulatory. Follow up as scheduled.  

## 2021-10-26 NOTE — Progress Notes (Signed)
?History of Present Illness:  ? ?He underwent ultrasound biopsy of his prostate in January 2016.  At that time PSA was 8.74 with percent free 16.  Prostatic volume was 61 mL, PSA density 0.14.  There was 1 core at the right base which revealed atypia.  All of the cores were benign. ?  ?Seen in October 2020.  At that time PSA was 13.3. ? ?3.15.2022: PSA 14.8 ? ?3.21.2023:Here for routine follow-up. No recent PSA. 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  He has had no real urinary symptoms.  He has a good stream and feels like he empties well.  No recent urinary tract infection. ? ? ?Past Medical History:  ?Diagnosis Date  ? 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 Hypertension   ? Port-A-Cath in place 10/15/2021  ? ? ?Past Surgical History:  ?Procedure Laterality Date  ? CARPAL TUNNEL RELEASE Right   ? COLONOSCOPY N/A 09/19/2013  ? Procedure: COLONOSCOPY;  Surgeon: Rogene Houston, MD;  Location: [facility];  Service: Endoscopy;  Laterality: N/A;  830  ? FINGER AMPUTATION Left   ? 4th and 5th  ? left eye blindness Left   ? PORTACATH PLACEMENT Right 10/16/2021  ? Procedure: INSERTION PORT-A-CATH;  Surgeon: Aviva Signs, MD;  Location: [facility] ORS;  Service: General;  Laterality: Right;  ? SVT ABLATION N/A 09/21/2018  ? Procedure: SVT ABLATION;  Surgeon: Constance Haw, MD;  Location: [facility];  Service: Cardiovascular;  Laterality: N/A;  ? ? ?Home Medications:  ?Allergies as of 10/27/2021   ? ?   Reactions  ? Aspirin Other (See Comments)  ? Gi upset, can tolerate baby aspirin  ? Flomax [tamsulosin] Nausea Only  ? ?  ? ?  ?Medication List  ?  ? ?  ? Accurate as of October 26, 2021  9:32 AM. If you have any questions, ask your nurse or doctor.  ?  ?  ? ?  ? ?albuterol 108 (90 Base) MCG/ACT inhaler ?Commonly known as: VENTOLIN HFA ?Inhale 2 puffs into the lungs every 6 (six) hours as needed for wheezing or shortness of breath. ?  ?azaCITIDine 5 mg/2 mLs in lactated ringers  infusion ?Inject 75 mg/m2 into the vein daily. Days 1-5 every 28 days ?  ?cholecalciferol 1000 units tablet ?Commonly known as: VITAMIN D ?Take 1,000 Units by mouth daily. ?  ?cloNIDine 0.1 MG tablet ?Commonly known as: CATAPRES ?Take 0.1 mg by mouth daily. ?  ?hydrochlorothiazide 25 MG tablet ?Commonly known as: HYDRODIURIL ?Take 25 mg by mouth daily. ?  ?levocetirizine 5 MG tablet ?Commonly known as: XYZAL ?Take 5 mg by mouth daily. ?  ?lidocaine-prilocaine cream ?Commonly known as: EMLA ?Apply a dime-sized amount to port a cath site (do not rub in) and cover with plastic wrap one hour prior to infusion appointments ?  ?lisinopril 40 MG tablet ?Commonly known as: ZESTRIL ?Take 40 mg by mouth daily. ?  ?pantoprazole 40 MG tablet ?Commonly known as: PROTONIX ?Take 1 tablet (40 mg total) by mouth daily. ?  ?prochlorperazine 10 MG tablet ?Commonly known as: COMPAZINE ?Take 1 tablet (10 mg total) by mouth every 6 (six) hours as needed (Nausea or vomiting). ?  ?vitamin B-12 1000 MCG tablet ?Commonly known as: CYANOCOBALAMIN ?Take 1,000 mcg by mouth daily. ?  ? ?  ? ? ?Allergies:  ?Allergies  ?Allergen Reactions  ? Aspirin Other (See Comments)  ?  Gi upset, can  tolerate baby aspirin  ? Flomax [Tamsulosin] Nausea Only  ? ? ?Family History  ?Problem Relation Age of Onset  ? CAD Mother   ? CAD Father   ? Cancer Brother   ? ? ?Social History:  reports that he quit smoking about 42 years ago. His smoking use included cigarettes. He started smoking about 65 years ago. He smoked an average of .5 packs per day. He has never used smokeless tobacco. He reports that he does not drink alcohol and does not use drugs. ? ?ROS: ?A complete review of systems was performed.  All systems are negative except for pertinent findings as noted. ? ?Physical Exam:  ?Vital signs in last 24 hours: ?There were no vitals taken for this visit. ?Constitutional:  Alert and oriented, No acute distress ?Cardiovascular: Regular rate  ?Respiratory: Normal  respiratory effort ?Genitourinary: Normal anal sphincter tone.  Prostate 40 to 50 g, symmetric, nonnodular, nontender. ?Lymphatic: No lymphadenopathy ?Neurologic: Grossly intact, no focal deficits ?Psychiatric: Normal mood and affect ? ?I have reviewed prior pt notes ? ?I have reviewed notes from referring/previous physicians ? ?I have reviewed urinalysis results ? ?I have independently reviewed prior imaging-prostate ultrasound volume ? ?I have reviewed prior PSA results ? ? ? ?Impression/Assessment:  ?1.  BPH without significant symptoms.  Prostate volume 61 mL when checked 7 years ago ? ?2.  Elevated PSA and 88 year old male with other more serious medical issues, normal exam ? ?Plan:  ?1.  The patient would like to have his PSA drawn when he gets his blood drawn at the cancer center-I communicated with Dr. Delton Coombes to see if he can have this drawn ? ?2.  Unless significant upward change, I will see back in a year ? ?

## 2021-10-27 ENCOUNTER — Other Ambulatory Visit: Payer: Self-pay

## 2021-10-27 ENCOUNTER — Ambulatory Visit: Payer: Medicare HMO | Admitting: Urology

## 2021-10-27 ENCOUNTER — Encounter: Payer: Self-pay | Admitting: Urology

## 2021-10-27 VITALS — BP 132/57 | HR 109

## 2021-10-27 DIAGNOSIS — R972 Elevated prostate specific antigen [PSA]: Secondary | ICD-10-CM

## 2021-10-27 DIAGNOSIS — N138 Other obstructive and reflux uropathy: Secondary | ICD-10-CM

## 2021-10-27 DIAGNOSIS — N401 Enlarged prostate with lower urinary tract symptoms: Secondary | ICD-10-CM | POA: Diagnosis not present

## 2021-10-27 LAB — URINALYSIS, ROUTINE W REFLEX MICROSCOPIC
Bilirubin, UA: NEGATIVE
Glucose, UA: NEGATIVE
Ketones, UA: NEGATIVE
Leukocytes,UA: NEGATIVE
Nitrite, UA: NEGATIVE
Protein,UA: NEGATIVE
Specific Gravity, UA: 1.015 (ref 1.005–1.030)
Urobilinogen, Ur: 0.2 mg/dL (ref 0.2–1.0)
pH, UA: 7 (ref 5.0–7.5)

## 2021-10-27 LAB — MICROSCOPIC EXAMINATION
Bacteria, UA: NONE SEEN
Epithelial Cells (non renal): NONE SEEN /hpf (ref 0–10)
RBC, Urine: NONE SEEN /hpf (ref 0–2)
Renal Epithel, UA: NONE SEEN /hpf
WBC, UA: NONE SEEN /hpf (ref 0–5)

## 2021-10-27 NOTE — Addendum Note (Signed)
Addended by: Dorisann Frames on: 10/27/2021 02:07 PM ? ? Modules accepted: Orders ? ?

## 2021-11-02 ENCOUNTER — Other Ambulatory Visit: Payer: Self-pay

## 2021-11-02 ENCOUNTER — Inpatient Hospital Stay (HOSPITAL_COMMUNITY): Payer: Medicare HMO

## 2021-11-02 VITALS — BP 103/59 | HR 60 | Temp 96.8°F | Resp 18

## 2021-11-02 DIAGNOSIS — Z5111 Encounter for antineoplastic chemotherapy: Secondary | ICD-10-CM | POA: Diagnosis not present

## 2021-11-02 DIAGNOSIS — D469 Myelodysplastic syndrome, unspecified: Secondary | ICD-10-CM

## 2021-11-02 DIAGNOSIS — C9310 Chronic myelomonocytic leukemia not having achieved remission: Secondary | ICD-10-CM

## 2021-11-02 DIAGNOSIS — D649 Anemia, unspecified: Secondary | ICD-10-CM

## 2021-11-02 LAB — CBC WITH DIFFERENTIAL/PLATELET
Abs Immature Granulocytes: 0.1 10*3/uL — ABNORMAL HIGH (ref 0.00–0.07)
Basophils Absolute: 0 10*3/uL (ref 0.0–0.1)
Basophils Relative: 0 %
Eosinophils Absolute: 0 10*3/uL (ref 0.0–0.5)
Eosinophils Relative: 0 %
HCT: 21 % — ABNORMAL LOW (ref 39.0–52.0)
Hemoglobin: 7 g/dL — ABNORMAL LOW (ref 13.0–17.0)
Immature Granulocytes: 2 %
Lymphocytes Relative: 45 %
Lymphs Abs: 2.4 10*3/uL (ref 0.7–4.0)
MCH: 32 pg (ref 26.0–34.0)
MCHC: 33.3 g/dL (ref 30.0–36.0)
MCV: 95.9 fL (ref 80.0–100.0)
Monocytes Absolute: 1.8 10*3/uL — ABNORMAL HIGH (ref 0.1–1.0)
Monocytes Relative: 33 %
Neutro Abs: 1.1 10*3/uL — ABNORMAL LOW (ref 1.7–7.7)
Neutrophils Relative %: 20 %
Platelets: 41 10*3/uL — ABNORMAL LOW (ref 150–400)
RBC: 2.19 MIL/uL — ABNORMAL LOW (ref 4.22–5.81)
RDW: 19.8 % — ABNORMAL HIGH (ref 11.5–15.5)
WBC: 5.4 10*3/uL (ref 4.0–10.5)
nRBC: 0.4 % — ABNORMAL HIGH (ref 0.0–0.2)

## 2021-11-02 LAB — COMPREHENSIVE METABOLIC PANEL
ALT: 9 U/L (ref 0–44)
AST: 15 U/L (ref 15–41)
Albumin: 3.7 g/dL (ref 3.5–5.0)
Alkaline Phosphatase: 43 U/L (ref 38–126)
Anion gap: 7 (ref 5–15)
BUN: 31 mg/dL — ABNORMAL HIGH (ref 8–23)
CO2: 22 mmol/L (ref 22–32)
Calcium: 8.7 mg/dL — ABNORMAL LOW (ref 8.9–10.3)
Chloride: 109 mmol/L (ref 98–111)
Creatinine, Ser: 1.63 mg/dL — ABNORMAL HIGH (ref 0.61–1.24)
GFR, Estimated: 41 mL/min — ABNORMAL LOW (ref >60)
Glucose, Bld: 120 mg/dL — ABNORMAL HIGH (ref 70–99)
Potassium: 3.6 mmol/L (ref 3.5–5.1)
Sodium: 138 mmol/L (ref 135–145)
Total Bilirubin: 0.8 mg/dL (ref 0.3–1.2)
Total Protein: 6.3 g/dL — ABNORMAL LOW (ref 6.5–8.1)

## 2021-11-02 LAB — LACTATE DEHYDROGENASE: LDH: 155 U/L (ref 98–192)

## 2021-11-02 LAB — PREPARE RBC (CROSSMATCH)

## 2021-11-02 LAB — MAGNESIUM: Magnesium: 2.1 mg/dL (ref 1.7–2.4)

## 2021-11-02 LAB — SAMPLE TO BLOOD BANK

## 2021-11-02 MED ORDER — ACETAMINOPHEN 325 MG PO TABS
650.0000 mg | ORAL_TABLET | Freq: Once | ORAL | Status: AC
  Administered 2021-11-02: 650 mg via ORAL

## 2021-11-02 MED ORDER — SODIUM CHLORIDE 0.9% FLUSH
10.0000 mL | INTRAVENOUS | Status: AC | PRN
  Administered 2021-11-02: 10 mL

## 2021-11-02 MED ORDER — HEPARIN SOD (PORK) LOCK FLUSH 100 UNIT/ML IV SOLN
500.0000 [IU] | Freq: Every day | INTRAVENOUS | Status: AC | PRN
  Administered 2021-11-02: 500 [IU]

## 2021-11-02 MED ORDER — ACETAMINOPHEN 325 MG PO TABS
650.0000 mg | ORAL_TABLET | Freq: Once | ORAL | Status: AC
  Filled 2021-11-02: qty 2

## 2021-11-02 MED ORDER — DIPHENHYDRAMINE HCL 25 MG PO CAPS
25.0000 mg | ORAL_CAPSULE | Freq: Once | ORAL | Status: AC
  Filled 2021-11-02: qty 1

## 2021-11-02 MED ORDER — SODIUM CHLORIDE 0.9% IV SOLUTION: 250.0000 mL | Freq: Once | INTRAVENOUS | Status: AC

## 2021-11-02 MED ORDER — DIPHENHYDRAMINE HCL 25 MG PO CAPS
25.0000 mg | ORAL_CAPSULE | Freq: Once | ORAL | Status: AC
  Administered 2021-11-02: 25 mg via ORAL

## 2021-11-02 MED ORDER — SODIUM CHLORIDE 0.9% IV SOLUTION
250.0000 mL | Freq: Once | INTRAVENOUS | Status: AC
  Administered 2021-11-02: 250 mL via INTRAVENOUS

## 2021-11-02 NOTE — Addendum Note (Signed)
Addended byFayne Norrie on: 11/02/2021 09:54 AM ? ? Modules accepted: Orders ? ?

## 2021-11-02 NOTE — Patient Instructions (Signed)
[facility]  Discharge Instructions: ?Thank you for choosing [facility] to provide your oncology and hematology care.  ?If you have a lab appointment with the Cancer Center, please come in thru the Main Entrance and check in at the main information desk. ? ?Wear comfortable clothing and clothing appropriate for easy access to any Portacath or PICC line.  ? ?We strive to give you quality time with your provider. You may need to reschedule your appointment if you arrive late (15 or more minutes).  Arriving late affects you and other patients whose appointments are after yours.  Also, if you miss three or more appointments without notifying the office, you may be dismissed from the clinic at the provider?s discretion.    ?  ?For prescription refill requests, have your pharmacy contact our office and allow 72 hours for refills to be completed.   ? ?Today you received the following chemotherapy and/or immunotherapy agents 1UPRBC    ?  ?To help prevent nausea and vomiting after your treatment, we encourage you to take your nausea medication as directed. ? ?BELOW ARE SYMPTOMS THAT SHOULD BE REPORTED IMMEDIATELY: ?*FEVER GREATER THAN 100.4 F (38 ?C) OR HIGHER ?*CHILLS OR SWEATING ?*NAUSEA AND VOMITING THAT IS NOT CONTROLLED WITH YOUR NAUSEA MEDICATION ?*UNUSUAL SHORTNESS OF BREATH ?*UNUSUAL BRUISING OR BLEEDING ?*URINARY PROBLEMS (pain or burning when urinating, or frequent urination) ?*BOWEL PROBLEMS (unusual diarrhea, constipation, pain near the anus) ?TENDERNESS IN MOUTH AND THROAT WITH OR WITHOUT PRESENCE OF ULCERS (sore throat, sores in mouth, or a toothache) ?UNUSUAL RASH, SWELLING OR PAIN  ?UNUSUAL VAGINAL DISCHARGE OR ITCHING  ? ?Items with * indicate a potential emergency and should be followed up as soon as possible or go to the Emergency Department if any problems should occur. ? ?Please show the CHEMOTHERAPY ALERT CARD or IMMUNOTHERAPY ALERT CARD at check-in to the Emergency  Department and triage nurse. ? ?Should you have questions after your visit or need to cancel or reschedule your appointment, please contact [facility] [phone]  and follow the prompts.  Office hours are 8:00 a.m. to 4:30 p.m. Monday - Friday. Please note that voicemails left after 4:00 p.m. may not be returned until the following business day.  We are closed weekends and major holidays. You have access to a nurse at all times for urgent questions. Please call the main number to the clinic [phone] and follow the prompts. ? ?For any non-urgent questions, you may also contact your provider using MyChart. We now offer e-Visits for anyone 18 and older to request care online for non-urgent symptoms. For details visit [email or web address]. ?  ?Also download the MyChart app! Go to the app store, search "MyChart", open the app, select [facility] [place], and log in with your MyChart username and password. ? ?Due to Covid, a mask is required upon entering the hospital/clinic. If you do not have a mask, one will be given to you upon arrival. For doctor visits, patients may have 1 support person aged 18 or older with them. For treatment visits, patients cannot have anyone with them due to current Covid guidelines and our immunocompromised population.  ?

## 2021-11-02 NOTE — Addendum Note (Signed)
Addended by: Benjiman Core D on: 11/02/2021 09:39 AM ? ? Modules accepted: Orders ? ?

## 2021-11-02 NOTE — Progress Notes (Addendum)
Patient presents today for possible blood transfusion per provider.  Vital signs WNL.  Patient has no new complaints at this time. ? ?Hgb noted to be 7.0, patient to receive 1UPRBC per providers order.   ? ?1UPRBC given today per MD orders.  Stable during infusion without adverse affects.  Vital signs stable.  No complaints at this time.  Discharge from clinic ambulatory in stable condition.  Alert and oriented X 3.  Follow up with [facility] as scheduled.  ?

## 2021-11-03 LAB — TYPE AND SCREEN
ABO/RH(D): B POS
Antibody Screen: NEGATIVE
Unit division: 0

## 2021-11-03 LAB — BPAM RBC
Blood Product Expiration Date: 202304182359
ISSUE DATE / TIME: 202303271042
Unit Type and Rh: 1700

## 2021-11-09 ENCOUNTER — Inpatient Hospital Stay (HOSPITAL_COMMUNITY): Payer: Medicare HMO

## 2021-11-09 ENCOUNTER — Inpatient Hospital Stay (HOSPITAL_COMMUNITY): Payer: Medicare HMO | Attending: Hematology

## 2021-11-09 VITALS — BP 122/61 | HR 64 | Temp 97.8°F | Resp 18

## 2021-11-09 DIAGNOSIS — C9310 Chronic myelomonocytic leukemia not having achieved remission: Secondary | ICD-10-CM | POA: Diagnosis not present

## 2021-11-09 DIAGNOSIS — Z87891 Personal history of nicotine dependence: Secondary | ICD-10-CM | POA: Diagnosis not present

## 2021-11-09 DIAGNOSIS — Z79899 Other long term (current) drug therapy: Secondary | ICD-10-CM | POA: Diagnosis not present

## 2021-11-09 DIAGNOSIS — D649 Anemia, unspecified: Secondary | ICD-10-CM

## 2021-11-09 DIAGNOSIS — Z809 Family history of malignant neoplasm, unspecified: Secondary | ICD-10-CM | POA: Diagnosis not present

## 2021-11-09 DIAGNOSIS — Z5111 Encounter for antineoplastic chemotherapy: Secondary | ICD-10-CM | POA: Insufficient documentation

## 2021-11-09 DIAGNOSIS — D469 Myelodysplastic syndrome, unspecified: Secondary | ICD-10-CM

## 2021-11-09 DIAGNOSIS — M129 Arthropathy, unspecified: Secondary | ICD-10-CM | POA: Insufficient documentation

## 2021-11-09 DIAGNOSIS — I10 Essential (primary) hypertension: Secondary | ICD-10-CM | POA: Diagnosis not present

## 2021-11-09 DIAGNOSIS — Z800 Family history of malignant neoplasm of digestive organs: Secondary | ICD-10-CM | POA: Diagnosis not present

## 2021-11-09 LAB — CBC
HCT: 21.9 % — ABNORMAL LOW (ref 39.0–52.0)
Hemoglobin: 7 g/dL — ABNORMAL LOW (ref 13.0–17.0)
MCH: 30.4 pg (ref 26.0–34.0)
MCHC: 32 g/dL (ref 30.0–36.0)
MCV: 95.2 fL (ref 80.0–100.0)
Platelets: 30 10*3/uL — ABNORMAL LOW (ref 150–400)
RBC: 2.3 MIL/uL — ABNORMAL LOW (ref 4.22–5.81)
RDW: 18.7 % — ABNORMAL HIGH (ref 11.5–15.5)
WBC: 3.6 10*3/uL — ABNORMAL LOW (ref 4.0–10.5)
nRBC: 0.6 % — ABNORMAL HIGH (ref 0.0–0.2)

## 2021-11-09 LAB — PREPARE RBC (CROSSMATCH)

## 2021-11-09 MED ORDER — SODIUM CHLORIDE 0.9% IV SOLUTION
250.0000 mL | Freq: Once | INTRAVENOUS | Status: AC
  Administered 2021-11-09: 250 mL via INTRAVENOUS

## 2021-11-09 MED ORDER — SODIUM CHLORIDE 0.9% FLUSH
10.0000 mL | INTRAVENOUS | Status: DC | PRN
Start: 2021-11-09 — End: 2021-11-09
  Administered 2021-11-09: 10 mL via INTRAVENOUS

## 2021-11-09 MED ORDER — ACETAMINOPHEN 325 MG PO TABS
650.0000 mg | ORAL_TABLET | Freq: Once | ORAL | Status: AC
  Administered 2021-11-09: 650 mg via ORAL
  Filled 2021-11-09: qty 2

## 2021-11-09 MED ORDER — HEPARIN SOD (PORK) LOCK FLUSH 100 UNIT/ML IV SOLN
500.0000 [IU] | Freq: Once | INTRAVENOUS | Status: AC
  Administered 2021-11-09: 500 [IU] via INTRAVENOUS

## 2021-11-09 MED ORDER — DIPHENHYDRAMINE HCL 25 MG PO CAPS
25.0000 mg | ORAL_CAPSULE | Freq: Once | ORAL | Status: AC
  Administered 2021-11-09: 25 mg via ORAL
  Filled 2021-11-09: qty 1

## 2021-11-09 NOTE — Patient Instructions (Signed)
[place]  Discharge Instructions: ?Thank you for choosing [facility] to provide your oncology and hematology care.  ?If you have a lab appointment with the [facility], please come in thru the Main Entrance and check in at the main information desk. ? ?Wear comfortable clothing and clothing appropriate for easy access to any Portacath or PICC line.  ? ?We strive to give you quality time with your provider. You may need to reschedule your appointment if you arrive late (15 or more minutes).  Arriving late affects you and other patients whose appointments are after yours.  Also, if you miss three or more appointments without notifying the office, you may be dismissed from the clinic at the provider?s discretion.    ?  ?For prescription refill requests, have your pharmacy contact our office and allow 72 hours for refills to be completed.   ? ?Today you received 1 unit of blood ?  ? ? ?BELOW ARE SYMPTOMS THAT SHOULD BE REPORTED IMMEDIATELY: ?*FEVER GREATER THAN 100.4 F (38 ?C) OR HIGHER ?*CHILLS OR SWEATING ?*NAUSEA AND VOMITING THAT IS NOT CONTROLLED WITH YOUR NAUSEA MEDICATION ?*UNUSUAL SHORTNESS OF BREATH ?*UNUSUAL BRUISING OR BLEEDING ?*URINARY PROBLEMS (pain or burning when urinating, or frequent urination) ?*BOWEL PROBLEMS (unusual diarrhea, constipation, pain near the anus) ?TENDERNESS IN MOUTH AND THROAT WITH OR WITHOUT PRESENCE OF ULCERS (sore throat, sores in mouth, or a toothache) ?UNUSUAL RASH, SWELLING OR PAIN  ?UNUSUAL VAGINAL DISCHARGE OR ITCHING  ? ?Items with * indicate a potential emergency and should be followed up as soon as possible or go to the Emergency Department if any problems should occur. ? ?Please show the CHEMOTHERAPY ALERT CARD or IMMUNOTHERAPY ALERT CARD at check-in to the Emergency Department and triage nurse. ? ?Should you have questions after your visit or need to cancel or reschedule your appointment, please contact [facility]  [phone]  and follow the prompts.  Office hours are 8:00 a.m. to 4:30 p.m. Monday - Friday. Please note that voicemails left after 4:00 p.m. may not be returned until the following business day.  We are closed weekends and major holidays. You have access to a nurse at all times for urgent questions. Please call the main number to the clinic [phone] and follow the prompts. ? ?For any non-urgent questions, you may also contact your provider using MyChart. We now offer e-Visits for anyone 43 and older to request care online for non-urgent symptoms. For details visit [email or web address]. ?  ?Also download the MyChart app! Go to the app store, search "MyChart", open the app, select [place], and log in with your MyChart username and password. ? ?Due to Covid, a mask is required upon entering the hospital/clinic. If you do not have a mask, one will be given to you upon arrival. For doctor visits, patients may have 1 support person aged 25 or older with them. For treatment visits, patients cannot have anyone with them due to current Covid guidelines and our immunocompromised population.  ?

## 2021-11-09 NOTE — Progress Notes (Signed)
Pt presents today for possible blood per provider's Vital signs stable and pt voiced no new complaints at this time. Pt's hemoglobin noted to be 7.0 today. Pt will receive 1 unit of blood today per Dr.K. ? ?1 unit of blood given today per MD orders. Tolerated infusion without adverse affects. Vital signs stable. No complaints at this time. Discharged from clinic ambulatory in stable condition. Alert and oriented x 3. F/U with [facility] as scheduled.   ?

## 2021-11-10 LAB — TYPE AND SCREEN
ABO/RH(D): B POS
Antibody Screen: NEGATIVE
Unit division: 0

## 2021-11-10 LAB — BPAM RBC
Blood Product Expiration Date: 202304242359
ISSUE DATE / TIME: 202304031006
Unit Type and Rh: 1700

## 2021-11-12 ENCOUNTER — Other Ambulatory Visit (HOSPITAL_COMMUNITY): Payer: Self-pay | Admitting: Hematology

## 2021-11-12 DIAGNOSIS — Z95828 Presence of other vascular implants and grafts: Secondary | ICD-10-CM

## 2021-11-12 DIAGNOSIS — C9310 Chronic myelomonocytic leukemia not having achieved remission: Secondary | ICD-10-CM

## 2021-11-16 ENCOUNTER — Inpatient Hospital Stay (HOSPITAL_COMMUNITY): Payer: Medicare HMO

## 2021-11-16 ENCOUNTER — Inpatient Hospital Stay (HOSPITAL_COMMUNITY): Payer: Medicare HMO | Admitting: Hematology

## 2021-11-16 VITALS — BP 171/74 | HR 66 | Temp 97.6°F | Resp 16 | Ht 66.0 in | Wt 156.8 lb

## 2021-11-16 DIAGNOSIS — D469 Myelodysplastic syndrome, unspecified: Secondary | ICD-10-CM

## 2021-11-16 DIAGNOSIS — Z5111 Encounter for antineoplastic chemotherapy: Secondary | ICD-10-CM | POA: Diagnosis not present

## 2021-11-16 DIAGNOSIS — C9310 Chronic myelomonocytic leukemia not having achieved remission: Secondary | ICD-10-CM

## 2021-11-16 DIAGNOSIS — Z95828 Presence of other vascular implants and grafts: Secondary | ICD-10-CM

## 2021-11-16 LAB — CBC WITH DIFFERENTIAL/PLATELET
Abs Immature Granulocytes: 0.09 10*3/uL — ABNORMAL HIGH (ref 0.00–0.07)
Basophils Absolute: 0 10*3/uL (ref 0.0–0.1)
Basophils Relative: 0 %
Eosinophils Absolute: 0 10*3/uL (ref 0.0–0.5)
Eosinophils Relative: 0 %
HCT: 24 % — ABNORMAL LOW (ref 39.0–52.0)
Hemoglobin: 8 g/dL — ABNORMAL LOW (ref 13.0–17.0)
Immature Granulocytes: 2 %
Lymphocytes Relative: 47 %
Lymphs Abs: 2.2 10*3/uL (ref 0.7–4.0)
MCH: 31.5 pg (ref 26.0–34.0)
MCHC: 33.3 g/dL (ref 30.0–36.0)
MCV: 94.5 fL (ref 80.0–100.0)
Monocytes Absolute: 1.6 10*3/uL — ABNORMAL HIGH (ref 0.1–1.0)
Monocytes Relative: 34 %
Neutro Abs: 0.8 10*3/uL — ABNORMAL LOW (ref 1.7–7.7)
Neutrophils Relative %: 17 %
Platelets: 32 10*3/uL — ABNORMAL LOW (ref 150–400)
RBC: 2.54 MIL/uL — ABNORMAL LOW (ref 4.22–5.81)
RDW: 17.9 % — ABNORMAL HIGH (ref 11.5–15.5)
WBC: 4.7 10*3/uL (ref 4.0–10.5)
nRBC: 0.4 % — ABNORMAL HIGH (ref 0.0–0.2)

## 2021-11-16 LAB — COMPREHENSIVE METABOLIC PANEL
ALT: 11 U/L (ref 0–44)
AST: 15 U/L (ref 15–41)
Albumin: 3.6 g/dL (ref 3.5–5.0)
Alkaline Phosphatase: 55 U/L (ref 38–126)
Anion gap: 6 (ref 5–15)
BUN: 24 mg/dL — ABNORMAL HIGH (ref 8–23)
CO2: 25 mmol/L (ref 22–32)
Calcium: 9 mg/dL (ref 8.9–10.3)
Chloride: 108 mmol/L (ref 98–111)
Creatinine, Ser: 1.15 mg/dL (ref 0.61–1.24)
GFR, Estimated: >60 mL/min (ref >60)
Glucose, Bld: 105 mg/dL — ABNORMAL HIGH (ref 70–99)
Potassium: 4 mmol/L (ref 3.5–5.1)
Sodium: 139 mmol/L (ref 135–145)
Total Bilirubin: 0.5 mg/dL (ref 0.3–1.2)
Total Protein: 6.3 g/dL — ABNORMAL LOW (ref 6.5–8.1)

## 2021-11-16 LAB — LACTATE DEHYDROGENASE: LDH: 151 U/L (ref 98–192)

## 2021-11-16 LAB — MAGNESIUM: Magnesium: 2.1 mg/dL (ref 1.7–2.4)

## 2021-11-16 LAB — SAMPLE TO BLOOD BANK

## 2021-11-16 MED ORDER — SODIUM CHLORIDE 0.9 % IV SOLN
10.0000 mg | Freq: Once | INTRAVENOUS | Status: AC
  Administered 2021-11-16: 10 mg via INTRAVENOUS
  Filled 2021-11-16: qty 10

## 2021-11-16 MED ORDER — SODIUM CHLORIDE 0.9 % IV SOLN: Freq: Once | INTRAVENOUS | Status: AC

## 2021-11-16 MED ORDER — PALONOSETRON HCL INJECTION 0.25 MG/5ML
0.2500 mg | Freq: Once | INTRAVENOUS | Status: AC
  Administered 2021-11-16: 0.25 mg via INTRAVENOUS
  Filled 2021-11-16: qty 5

## 2021-11-16 MED ORDER — SODIUM CHLORIDE 0.9 % IV SOLN
55.0000 mg/m2 | Freq: Once | INTRAVENOUS | Status: AC
  Administered 2021-11-16: 100 mg via INTRAVENOUS
  Filled 2021-11-16: qty 10

## 2021-11-16 MED ORDER — HEPARIN SOD (PORK) LOCK FLUSH 100 UNIT/ML IV SOLN
500.0000 [IU] | Freq: Once | INTRAVENOUS | Status: AC | PRN
  Administered 2021-11-16: 500 [IU]

## 2021-11-16 MED ORDER — SODIUM CHLORIDE 0.9% FLUSH
10.0000 mL | INTRAVENOUS | Status: DC | PRN
  Administered 2021-11-16: 10 mL

## 2021-11-16 NOTE — Progress Notes (Signed)
Patient has been examined by Dr. Katragadda, and vital signs and labs have been reviewed. ANC, Creatinine, LFTs, hemoglobin, and platelets are within treatment parameters per M.D. - pt may proceed with treatment.    °

## 2021-11-16 NOTE — Progress Notes (Signed)
? ?[facility] ?[address] ?[name], [name] [address] ? ? ?CLINIC:  ?Medical Oncology/Hematology ? ?PCP:  ?Jani Gravel, MD ?[address] [place] / [place] [address] ?[phone] ? ? 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 ?PRIOR THERAPY: none ? ?NGS Results: NF1, TET2, U2 AF-1, DNMT3A mutations positive. ? ?CURRENT THERAPY: Azacitidine IV D1-7 q28d ? 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  ?10/19/2021 -  Chemotherapy  ? Patient is on Treatment Plan : MYELODYSPLASIA  Azacitidine IV D1-7 q28d  ?   ? ? ?CANCER STAGING: ? Cancer Staging  ?No matching staging information was found for the patient. ? ?INTERVAL HISTORY:  ?[name], a 88 y.o. male, returns for routine follow-up and consideration for next cycle of chemotherapy. Winn was last seen on 03/13/203. ? ?Due for cycle #2 of Azacitidine today.  ? ?Overall, he tells me he has been feeling pretty well. His energy and SOB have improved. He denies bleeding issues. He denies infections.  ? ?Overall, he feels ready for next cycle of chemo today.  ? ?REVIEW OF SYSTEMS:  ?Review of Systems  ?Constitutional:  Negative for appetite change and fatigue.  ?HENT:   Negative for nosebleeds.   ?Respiratory:  Negative for hemoptysis and shortness of breath (improved).   ?Gastrointestinal:  Negative for blood in stool.  ?Genitourinary:  Negative for hematuria.   ?Psychiatric/Behavioral:  Positive for sleep disturbance.   ?All other systems reviewed and are negative. ? ?PAST MEDICAL/SURGICAL HISTORY:  ?Past Medical History:  ?Diagnosis Date  ? 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 Hypertension   ? Port-A-Cath in place 10/15/2021  ? ?Past Surgical History:  ?Procedure Laterality Date  ? CARPAL TUNNEL RELEASE Right   ?  COLONOSCOPY N/A 09/19/2013  ? Procedure: COLONOSCOPY;  Surgeon: Rogene Houston, MD;  Location: [facility];  Service: Endoscopy;  Laterality: N/A;  830  ? FINGER AMPUTATION Left   ? 4th and 5th  ? left eye blindness Left   ? PORTACATH PLACEMENT Right 10/16/2021  ? Procedure: INSERTION PORT-A-CATH;  Surgeon: Aviva Signs, MD;  Location: [facility] ORS;  Service: General;  Laterality: Right;  ? SVT ABLATION N/A 09/21/2018  ? Procedure: SVT ABLATION;  Surgeon: Constance Haw, MD;  Location: [facility] CV LAB;  Service: Cardiovascular;  Laterality: N/A;  ? ? ?SOCIAL HISTORY:  ?Social History  ? ?Socioeconomic History  ? Marital status: Widowed  ?  Spouse name: Not on file  ? Number of children: Not on file  ? Years of education: Not on file  ? Highest education level: Not on file  ?Occupational History  ? Not on file  ?Tobacco Use  ? Smoking status: Former  ?  Packs/day: 0.50  ?  Types: Cigarettes  ?  Start date: 08/09/1956  ?  Quit date: 08/10/1979  ?  Years since quitting: 42.2  ? Smokeless tobacco: Never  ?Vaping Use  ? Vaping Use: Never used  ?Substance and Sexual Activity  ? Alcohol use: No  ?  Alcohol/week: 0.0 standard drinks  ? Drug use: No  ? Sexual activity: Not on file  ?Other Topics Concern  ? Not on file  ?Social History Narrative  ? Not on file  ? ?Social Determinants of Health  ? ?  Financial Resource Strain: Not on file  ?Food Insecurity: Not on file  ?Transportation Needs: Not on file  ?Physical Activity: Not on file  ?Stress: Not on file  ?Social Connections: Not on file  ?Intimate Partner Violence: Not on file  ? ? ?FAMILY HISTORY:  ?Family History  ?Problem Relation Age of Onset  ? CAD Mother   ? CAD Father   ? Cancer Brother   ? ? ?CURRENT MEDICATIONS:  ?Current Outpatient Medications  ?Medication Sig Dispense Refill  ? albuterol (VENTOLIN HFA) 108 (90 Base) MCG/ACT inhaler Inhale 2 puffs into the lungs every 6 (six) hours as needed for wheezing or shortness of breath.    ? azaCITIDine 5 mg/2 mLs in  lactated ringers infusion Inject 75 mg/m2 into the vein daily. Days 1-5 every 28 days    ? cholecalciferol (VITAMIN D) 1000 units tablet Take 1,000 Units by mouth daily.    ? cloNIDine (CATAPRES) 0.1 MG tablet Take 0.1 mg by mouth daily.    ? hydrochlorothiazide (HYDRODIURIL) 25 MG tablet Take 25 mg by mouth daily.     ? levocetirizine (XYZAL) 5 MG tablet Take 5 mg by mouth daily.    ? lidocaine-prilocaine (EMLA) cream Apply a dime-sized amount to port a cath site (do not rub in) and cover with plastic wrap one hour prior to infusion appointments 30 g 3  ? lisinopril (PRINIVIL,ZESTRIL) 40 MG tablet Take 40 mg by mouth daily.     ? pantoprazole (PROTONIX) 40 MG tablet Take 1 tablet (40 mg total) by mouth daily. 30 tablet 1  ? prochlorperazine (COMPAZINE) 10 MG tablet TAKE (1) TABLET BY MOUTH EVERY SIX HOURS AS NEEDED. 60 tablet 3  ? vitamin B-12 (CYANOCOBALAMIN) 1000 MCG tablet Take 1,000 mcg by mouth daily.    ? ?No current facility-administered medications for this visit.  ? ?Facility-Administered Medications Ordered in Other Visits  ?Medication Dose Route Frequency Provider Last Rate Last Admin  ? 0.9 %  sodium chloride infusion (Manually program via Guardrails IV Fluids)  250 mL Intravenous Once Derek Jack, MD      ? ? ?ALLERGIES:  ?Allergies  ?Allergen Reactions  ? Aspirin Other (See Comments)  ?  Gi upset, can tolerate baby aspirin  ? Flomax [Tamsulosin] Nausea Only  ? ? ?PHYSICAL EXAM:  ?Performance status (ECOG): 1 - Symptomatic but completely ambulatory ? ?There were no vitals filed for this visit. ?Wt Readings from Last 3 Encounters:  ?10/20/21 164 lb 3.2 oz (74.5 kg)  ?10/19/21 161 lb 8 oz (73.3 kg)  ?10/16/21 160 lb 15 oz (73 kg)  ? ?Physical Exam ?Vitals reviewed.  ?Constitutional:   ?   Appearance: Normal appearance.  ?Cardiovascular:  ?   Rate and Rhythm: Normal rate and regular rhythm.  ?   Pulses: Normal pulses.  ?   Heart sounds: Normal heart sounds.  ?Pulmonary:  ?   Effort: Pulmonary  effort is normal.  ?   Breath sounds: Normal breath sounds.  ?Musculoskeletal:  ?   Right lower leg: No edema.  ?   Left lower leg: No edema.  ?Neurological:  ?   General: No focal deficit present.  ?   Mental Status: He is alert and oriented to person, place, and time.  ?Psychiatric:     ?   Mood and Affect: Mood normal.     ?   Behavior: Behavior normal.  ? ? ?LABORATORY DATA:  ?I have reviewed the labs as listed.  ? ?  Latest Ref Rng &  Units 11/16/2021  ?  9:43 AM 11/09/2021  ?  7:48 AM 11/02/2021  ?  8:38 AM  ?CBC  ?WBC 4.0 - 10.5 K/uL 4.7   3.6   5.4    ?Hemoglobin 13.0 - 17.0 g/dL 8.0   7.0   7.0    ?Hematocrit 39.0 - 52.0 % 24.0   21.9   21.0    ?Platelets 150 - 400 K/uL 32   30   41    ? ? ?  Latest Ref Rng & Units 11/16/2021  ?  9:43 AM 11/02/2021  ?  8:38 AM 10/19/2021  ?  8:22 AM  ?CMP  ?Glucose 70 - 99 mg/dL 105   120   100    ?BUN 8 - 23 mg/dL _0 ?Creatinine 0.61 - 1.24 mg/dL 1.15   1.63   1.31    ?Sodium 135 - 145 mmol/L 139   138   139    ?Potassium 3.5 - 5.1 mmol/L 4.0   3.6   4.0    ?Chloride 98 - 111 mmol/L 108   109   107    ?CO2 22 - 32 mmol/L _1 ?Calcium 8.9 - 10.3 mg/dL 9.0   8.7   9.0    ?Total Protein 6.5 - 8.1 g/dL 6.3   6.3   6.6    ?Total Bilirubin 0.3 - 1.2 mg/dL 0.5   0.8   0.4    ?Alkaline Phos 38 - 126 U/L 55   43   44    ?AST 15 - 41 U/L _2 ?ALT 0 - 44 U/L _3 ? ? ?DIAGNOSTIC IMAGING:  ?I have independently reviewed the scans and discussed with the patient. ?No results found.  ? 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  Hemoglobin 15.5 on 09/20/2018.  Platelet count on 09/20/1998 2155. ?- He was on aspirin 81 mg at presentation.  He received 3 units PRBC.  Ferritin was 244 and percent saturation 37.  Z83 was 462 and folic acid normal.  Creatinine was normal. ?- MMA, copper, folic acid and  LDH was normal.  SPEP negative. ?- Serum EPO 140.  Kappa light chains elevated at 32.4 with ratio 1.55. ?- Bone marrow biopsy on 09/18/2021: Hypercellular marrow with trilineage dyspoiesis and monocytosis.  Dyspoi

## 2021-11-16 NOTE — Progress Notes (Signed)
Patient presents today for treatment and follow up visit with Dr. Audery Amel. ANC 800. Platelets 32. MD aware and labs reviewed. Message received to proceed with treatment today. Vital signs within parameters for treatment . Attestation 10/01/21. Consent 10/19/2021. ? ?Treatment given today per MD orders. Tolerated infusion without adverse affects. Vital signs stable. No complaints at this time. Discharged from clinic ambulatory in stable condition. Alert and oriented x 3. F/U with [facility] as scheduled.   ?

## 2021-11-16 NOTE — Patient Instructions (Signed)
[place] at [facility] ?Discharge Instructions ? ? ?You were seen and examined today by Dr. Delton Coombes. ? ?He reviewed your lab work today which is normal/stable.  ? ?We will proceed with your treatment today. We will treat you Monday-Friday this week and Monday and Tuesday of next week. ? ?We will continue to monitor your blood work weekly.  ? ?Return as scheduled.  ? ? ?Thank you for choosing [place] at [facility] to provide your oncology and hematology care.  To afford each patient quality time with our provider, please arrive at least 15 minutes before your scheduled appointment time.  ? ?If you have a lab appointment with the [facility] please come in thru the Main Entrance and check in at the main information desk. ? ?You need to re-schedule your appointment should you arrive 10 or more minutes late.  We strive to give you quality time with our providers, and arriving late affects you and other patients whose appointments are after yours.  Also, if you no show three or more times for appointments you may be dismissed from the clinic at the providers discretion.     ?Again, thank you for choosing [facility].  Our hope is that these requests will decrease the amount of time that you wait before being seen by our physicians.       ?_____________________________________________________________ ? ?Should you have questions after your visit to [facility], please contact our office at [phone] and follow the prompts.  Our office hours are 8:00 a.m. and 4:30 p.m. Monday - Friday.  Please note that voicemails left after 4:00 p.m. may not be returned until the following business day.  We are closed weekends and major holidays.  You do have access to a nurse 24-7, just call the main number to the clinic [phone] and do not press any options, hold on the line and a nurse will answer the phone.   ? ?For prescription refill  requests, have your pharmacy contact our office and allow 72 hours.   ? ?Due to Covid, you will need to wear a mask upon entering the hospital. If you do not have a mask, a mask will be given to you at the Main Entrance upon arrival. For doctor visits, patients may have 1 support person age 37 or older with them. For treatment visits, patients can not have anyone with them due to social distancing guidelines and our immunocompromised population.  ? ?   ?

## 2021-11-17 ENCOUNTER — Encounter (HOSPITAL_COMMUNITY): Payer: Self-pay

## 2021-11-17 ENCOUNTER — Inpatient Hospital Stay (HOSPITAL_COMMUNITY): Payer: Medicare HMO

## 2021-11-17 VITALS — BP 96/50 | HR 75 | Temp 97.5°F | Resp 18

## 2021-11-17 DIAGNOSIS — C9310 Chronic myelomonocytic leukemia not having achieved remission: Secondary | ICD-10-CM

## 2021-11-17 DIAGNOSIS — Z95828 Presence of other vascular implants and grafts: Secondary | ICD-10-CM

## 2021-11-17 DIAGNOSIS — Z5111 Encounter for antineoplastic chemotherapy: Secondary | ICD-10-CM | POA: Diagnosis not present

## 2021-11-17 MED ORDER — SODIUM CHLORIDE 0.9 % IV SOLN
55.0000 mg/m2 | Freq: Once | INTRAVENOUS | Status: AC
  Administered 2021-11-17: 100 mg via INTRAVENOUS
  Filled 2021-11-17: qty 10

## 2021-11-17 MED ORDER — SODIUM CHLORIDE 0.9% FLUSH
10.0000 mL | INTRAVENOUS | Status: DC | PRN
  Administered 2021-11-17: 10 mL

## 2021-11-17 MED ORDER — HEPARIN SOD (PORK) LOCK FLUSH 100 UNIT/ML IV SOLN
500.0000 [IU] | Freq: Once | INTRAVENOUS | Status: AC | PRN
  Administered 2021-11-17: 500 [IU]

## 2021-11-17 MED ORDER — SODIUM CHLORIDE 0.9 % IV SOLN: Freq: Once | INTRAVENOUS | Status: AC

## 2021-11-17 MED ORDER — SODIUM CHLORIDE 0.9 % IV SOLN
10.0000 mg | Freq: Once | INTRAVENOUS | Status: AC
  Administered 2021-11-17: 10 mg via INTRAVENOUS
  Filled 2021-11-17: qty 10

## 2021-11-17 NOTE — Progress Notes (Signed)
Patient tolerated chemotherapy with no complaints voiced.  Side effects with management reviewed with understanding verbalized.  Port site clean and dry with no bruising or swelling noted at site.  Good blood return noted before and after administration of chemotherapy.  Dressing intact.   Patient left in satisfactory condition with VSS and no s/s of distress noted.  

## 2021-11-17 NOTE — Patient Instructions (Signed)
[place]  Discharge Instructions: ?Thank you for choosing [facility] to provide your oncology and hematology care.  ?If you have a lab appointment with the [facility], please come in thru the Main Entrance and check in at the main information desk. ? ?Wear comfortable clothing and clothing appropriate for easy access to any Portacath or PICC line.  ? ?We strive to give you quality time with your provider. You may need to reschedule your appointment if you arrive late (15 or more minutes).  Arriving late affects you and other patients whose appointments are after yours.  Also, if you miss three or more appointments without notifying the office, you may be dismissed from the clinic at the provider?s discretion.    ?  ?For prescription refill requests, have your pharmacy contact our office and allow 72 hours for refills to be completed.   ? ?Today you received the following chemotherapy and/or immunotherapy agents vidaza.  ?  ?To help prevent nausea and vomiting after your treatment, we encourage you to take your nausea medication as directed. ? ?BELOW ARE SYMPTOMS THAT SHOULD BE REPORTED IMMEDIATELY: ?*FEVER GREATER THAN 100.4 F (38 ?C) OR HIGHER ?*CHILLS OR SWEATING ?*NAUSEA AND VOMITING THAT IS NOT CONTROLLED WITH YOUR NAUSEA MEDICATION ?*UNUSUAL SHORTNESS OF BREATH ?*UNUSUAL BRUISING OR BLEEDING ?*URINARY PROBLEMS (pain or burning when urinating, or frequent urination) ?*BOWEL PROBLEMS (unusual diarrhea, constipation, pain near the anus) ?TENDERNESS IN MOUTH AND THROAT WITH OR WITHOUT PRESENCE OF ULCERS (sore throat, sores in mouth, or a toothache) ?UNUSUAL RASH, SWELLING OR PAIN  ?UNUSUAL VAGINAL DISCHARGE OR ITCHING  ? ?Items with * indicate a potential emergency and should be followed up as soon as possible or go to the Emergency Department if any problems should occur. ? ?Please show the CHEMOTHERAPY ALERT CARD or IMMUNOTHERAPY ALERT CARD at check-in to the Emergency Department  and triage nurse. ? ?Should you have questions after your visit or need to cancel or reschedule your appointment, please contact [facility] [phone]  and follow the prompts.  Office hours are 8:00 a.m. to 4:30 p.m. Monday - Friday. Please note that voicemails left after 4:00 p.m. may not be returned until the following business day.  We are closed weekends and major holidays. You have access to a nurse at all times for urgent questions. Please call the main number to the clinic [phone] and follow the prompts. ? ?For any non-urgent questions, you may also contact your provider using MyChart. We now offer e-Visits for anyone 38 and older to request care online for non-urgent symptoms. For details visit [email or web address]. ?  ?Also download the MyChart app! Go to the app store, search "MyChart", open the app, select [facility], and log in with your MyChart username and password. ? ?Due to Covid, a mask is required upon entering the hospital/clinic. If you do not have a mask, one will be given to you upon arrival. For doctor visits, patients may have 1 support person aged 14 or older with them. For treatment visits, patients cannot have anyone with them due to current Covid guidelines and our immunocompromised population.  ?

## 2021-11-18 ENCOUNTER — Inpatient Hospital Stay (HOSPITAL_COMMUNITY): Payer: Medicare HMO

## 2021-11-18 VITALS — BP 92/42 | HR 72 | Temp 96.3°F | Resp 18

## 2021-11-18 DIAGNOSIS — Z95828 Presence of other vascular implants and grafts: Secondary | ICD-10-CM

## 2021-11-18 DIAGNOSIS — Z5111 Encounter for antineoplastic chemotherapy: Secondary | ICD-10-CM | POA: Diagnosis not present

## 2021-11-18 DIAGNOSIS — C9310 Chronic myelomonocytic leukemia not having achieved remission: Secondary | ICD-10-CM

## 2021-11-18 MED ORDER — SODIUM CHLORIDE 0.9 % IV SOLN
10.0000 mg | Freq: Once | INTRAVENOUS | Status: AC
  Administered 2021-11-18: 10 mg via INTRAVENOUS
  Filled 2021-11-18: qty 1

## 2021-11-18 MED ORDER — PALONOSETRON HCL INJECTION 0.25 MG/5ML
0.2500 mg | Freq: Once | INTRAVENOUS | Status: AC
  Administered 2021-11-18: 0.25 mg via INTRAVENOUS
  Filled 2021-11-18: qty 5

## 2021-11-18 MED ORDER — SODIUM CHLORIDE 0.9 % IV SOLN: Freq: Once | INTRAVENOUS | Status: AC

## 2021-11-18 MED ORDER — SODIUM CHLORIDE 0.9 % IV SOLN
55.0000 mg/m2 | Freq: Once | INTRAVENOUS | Status: AC
  Administered 2021-11-18: 100 mg via INTRAVENOUS
  Filled 2021-11-18: qty 10

## 2021-11-18 MED ORDER — HEPARIN SOD (PORK) LOCK FLUSH 100 UNIT/ML IV SOLN
500.0000 [IU] | Freq: Once | INTRAVENOUS | Status: AC | PRN
  Administered 2021-11-18: 500 [IU]

## 2021-11-18 MED ORDER — SODIUM CHLORIDE 0.9% FLUSH
10.0000 mL | INTRAVENOUS | Status: DC | PRN
  Administered 2021-11-18 (×2): 10 mL

## 2021-11-18 NOTE — Patient Instructions (Signed)
[facility]  Discharge Instructions: ?Thank you for choosing [facility] to provide your oncology and hematology care.  ?If you have a lab appointment with the [facility], please come in thru the Main Entrance and check in at the main information desk. ? ?Wear comfortable clothing and clothing appropriate for easy access to any Portacath or PICC line.  ? ?We strive to give you quality time with your provider. You may need to reschedule your appointment if you arrive late (15 or more minutes).  Arriving late affects you and other patients whose appointments are after yours.  Also, if you miss three or more appointments without notifying the office, you may be dismissed from the clinic at the provider?s discretion.    ?  ?For prescription refill requests, have your pharmacy contact our office and allow 72 hours for refills to be completed.   ? ?Today you received the following chemotherapy and/or immunotherapy agents, Vidaza. Due to dizziness and low BP Dr. Delton Coombes recommends stopping clonidine at home. Return as scheduled. ? ?To help prevent nausea and vomiting after your treatment, we encourage you to take your nausea medication as directed. ? ?BELOW ARE SYMPTOMS THAT SHOULD BE REPORTED IMMEDIATELY: ?*FEVER GREATER THAN 100.4 F (38 ?C) OR HIGHER ?*CHILLS OR SWEATING ?*NAUSEA AND VOMITING THAT IS NOT CONTROLLED WITH YOUR NAUSEA MEDICATION ?*UNUSUAL SHORTNESS OF BREATH ?*UNUSUAL BRUISING OR BLEEDING ?*URINARY PROBLEMS (pain or burning when urinating, or frequent urination) ?*BOWEL PROBLEMS (unusual diarrhea, constipation, pain near the anus) ?TENDERNESS IN MOUTH AND THROAT WITH OR WITHOUT PRESENCE OF ULCERS (sore throat, sores in mouth, or a toothache) ?UNUSUAL RASH, SWELLING OR PAIN  ?UNUSUAL VAGINAL DISCHARGE OR ITCHING  ? ?Items with * indicate a potential emergency and should be followed up as soon as possible or go to the Emergency Department if any problems should  occur. ? ?Please show the CHEMOTHERAPY ALERT CARD or IMMUNOTHERAPY ALERT CARD at check-in to the Emergency Department and triage nurse. ? ?Should you have questions after your visit or need to cancel or reschedule your appointment, please contact [facility] [phone]  and follow the prompts.  Office hours are 8:00 a.m. to 4:30 p.m. Monday - Friday. Please note that voicemails left after 4:00 p.m. may not be returned until the following business day.  We are closed weekends and major holidays. You have access to a nurse at all times for urgent questions. Please call the main number to the clinic [phone] and follow the prompts. ? ?For any non-urgent questions, you may also contact your provider using MyChart. We now offer e-Visits for anyone 71 and older to request care online for non-urgent symptoms. For details visit [email or web address]. ?  ?Also download the MyChart app! Go to the app store, search "MyChart", open the app, select [facility], and log in with your MyChart username and password. ? ?Due to Covid, a mask is required upon entering the hospital/clinic. If you do not have a mask, one will be given to you upon arrival. For doctor visits, patients may have 1 support person aged 27 or older with them. For treatment visits, patients cannot have anyone with them due to current Covid guidelines and our immunocompromised population.  ?

## 2021-11-18 NOTE — Progress Notes (Signed)
Patient presents today for Vidaza, patient reports feeling dizziness when he gets up tp walk around at home, Dr. Delton Coombes made aware with orders to stop Clonidine at home d/t dizziness. Patient verbalizes understanding. Patient tolerated chemotherapy with no complaints voiced. Side effects with management reviewed understanding verbalized. Port site clean and dry with no bruising or swelling noted at site. Good blood return noted before and after administration of chemotherapy. Band aid applied. Patient left in satisfactory condition with VSS and no s/s of distress noted.  ?

## 2021-11-19 ENCOUNTER — Inpatient Hospital Stay (HOSPITAL_COMMUNITY): Payer: Medicare HMO

## 2021-11-19 VITALS — BP 115/55 | HR 79 | Temp 97.5°F | Resp 18

## 2021-11-19 DIAGNOSIS — C9310 Chronic myelomonocytic leukemia not having achieved remission: Secondary | ICD-10-CM

## 2021-11-19 DIAGNOSIS — Z95828 Presence of other vascular implants and grafts: Secondary | ICD-10-CM

## 2021-11-19 DIAGNOSIS — Z5111 Encounter for antineoplastic chemotherapy: Secondary | ICD-10-CM | POA: Diagnosis not present

## 2021-11-19 MED ORDER — HEPARIN SOD (PORK) LOCK FLUSH 100 UNIT/ML IV SOLN
500.0000 [IU] | Freq: Once | INTRAVENOUS | Status: AC | PRN
  Administered 2021-11-19: 500 [IU]

## 2021-11-19 MED ORDER — SODIUM CHLORIDE 0.9 % IV SOLN
55.0000 mg/m2 | Freq: Once | INTRAVENOUS | Status: AC
  Administered 2021-11-19: 100 mg via INTRAVENOUS
  Filled 2021-11-19: qty 10

## 2021-11-19 MED ORDER — SODIUM CHLORIDE 0.9 % IV SOLN
10.0000 mg | Freq: Once | INTRAVENOUS | Status: AC
  Administered 2021-11-19: 10 mg via INTRAVENOUS
  Filled 2021-11-19: qty 1

## 2021-11-19 MED ORDER — SODIUM CHLORIDE 0.9 % IV SOLN: Freq: Once | INTRAVENOUS | Status: AC

## 2021-11-19 MED ORDER — SODIUM CHLORIDE 0.9% FLUSH
10.0000 mL | INTRAVENOUS | Status: DC | PRN
  Administered 2021-11-19: 10 mL

## 2021-11-19 NOTE — Progress Notes (Signed)
Patient presents today for Vidaza infusion.  Patient is in satisfactory condition with no complaints voiced.  Vital signs are stable.  We will proceed with treatment per MD orders.  ? ?Patient tolerated treatment well with no complaints voiced.  Patient remains accessed for treatment tomorrow.  Patient left ambulatory in stable condition.  Vital signs stable at discharge.  Follow up as scheduled.    ?

## 2021-11-19 NOTE — Patient Instructions (Signed)
[facility]  Discharge Instructions: Thank you for choosing [facility] to provide your oncology and hematology care.  If you have a lab appointment with the Cancer Center, please come in thru the Main Entrance and check in at the main information desk.  Wear comfortable clothing and clothing appropriate for easy access to any Portacath or PICC line.   We strive to give you quality time with your provider. You may need to reschedule your appointment if you arrive late (15 or more minutes).  Arriving late affects you and other patients whose appointments are after yours.  Also, if you miss three or more appointments without notifying the office, you may be dismissed from the clinic at the provider's discretion.      For prescription refill requests, have your pharmacy contact our office and allow 72 hours for refills to be completed.        To help prevent nausea and vomiting after your treatment, we encourage you to take your nausea medication as directed.  BELOW ARE SYMPTOMS THAT SHOULD BE REPORTED IMMEDIATELY: *FEVER GREATER THAN 100.4 F (38 C) OR HIGHER *CHILLS OR SWEATING *NAUSEA AND VOMITING THAT IS NOT CONTROLLED WITH YOUR NAUSEA MEDICATION *UNUSUAL SHORTNESS OF BREATH *UNUSUAL BRUISING OR BLEEDING *URINARY PROBLEMS (pain or burning when urinating, or frequent urination) *BOWEL PROBLEMS (unusual diarrhea, constipation, pain near the anus) TENDERNESS IN MOUTH AND THROAT WITH OR WITHOUT PRESENCE OF ULCERS (sore throat, sores in mouth, or a toothache) UNUSUAL RASH, SWELLING OR PAIN  UNUSUAL VAGINAL DISCHARGE OR ITCHING   Items with * indicate a potential emergency and should be followed up as soon as possible or go to the Emergency Department if any problems should occur.  Please show the CHEMOTHERAPY ALERT CARD or IMMUNOTHERAPY ALERT CARD at check-in to the Emergency Department and triage nurse.  Should you have questions after your visit or need to cancel  or reschedule your appointment, please contact  CANCER CENTER [phone]  and follow the prompts.  Office hours are 8:00 a.m. to 4:30 p.m. Monday - Friday. Please note that voicemails left after 4:00 p.m. may not be returned until the following business day.  We are closed weekends and major holidays. You have access to a nurse at all times for urgent questions. Please call the main number to the clinic [phone] and follow the prompts.  For any non-urgent questions, you may also contact your provider using MyChart. We now offer e-Visits for anyone 18 and older to request care online for non-urgent symptoms. For details visit [email or web address].   Also download the MyChart app! Go to the app store, search "MyChart", open the app, select [facility], and log in with your MyChart username and password.  Due to Covid, a mask is required upon entering the hospital/clinic. If you do not have a mask, one will be given to you upon arrival. For doctor visits, patients may have 1 support person aged 18 or older with them. For treatment visits, patients cannot have anyone with them due to current Covid guidelines and our immunocompromised population.  

## 2021-11-20 ENCOUNTER — Inpatient Hospital Stay (HOSPITAL_COMMUNITY): Payer: Medicare HMO

## 2021-11-20 VITALS — BP 136/75 | HR 77 | Temp 97.5°F | Resp 18

## 2021-11-20 DIAGNOSIS — Z5111 Encounter for antineoplastic chemotherapy: Secondary | ICD-10-CM | POA: Diagnosis not present

## 2021-11-20 DIAGNOSIS — Z95828 Presence of other vascular implants and grafts: Secondary | ICD-10-CM

## 2021-11-20 DIAGNOSIS — C9310 Chronic myelomonocytic leukemia not having achieved remission: Secondary | ICD-10-CM

## 2021-11-20 MED ORDER — SODIUM CHLORIDE 0.9 % IV SOLN
10.0000 mg | Freq: Once | INTRAVENOUS | Status: AC
  Administered 2021-11-20: 10 mg via INTRAVENOUS
  Filled 2021-11-20: qty 1

## 2021-11-20 MED ORDER — SODIUM CHLORIDE 0.9 % IV SOLN
55.0000 mg/m2 | Freq: Once | INTRAVENOUS | Status: AC
  Administered 2021-11-20: 100 mg via INTRAVENOUS
  Filled 2021-11-20: qty 10

## 2021-11-20 MED ORDER — SODIUM CHLORIDE 0.9 % IV SOLN: Freq: Once | INTRAVENOUS | Status: AC

## 2021-11-20 MED ORDER — SODIUM CHLORIDE 0.9% FLUSH
10.0000 mL | INTRAVENOUS | Status: DC | PRN
  Administered 2021-11-20: 10 mL

## 2021-11-20 MED ORDER — HEPARIN SOD (PORK) LOCK FLUSH 100 UNIT/ML IV SOLN
500.0000 [IU] | Freq: Once | INTRAVENOUS | Status: AC | PRN
  Administered 2021-11-20: 500 [IU]

## 2021-11-20 MED ORDER — PALONOSETRON HCL INJECTION 0.25 MG/5ML
0.2500 mg | Freq: Once | INTRAVENOUS | Status: AC
  Administered 2021-11-20: 0.25 mg via INTRAVENOUS
  Filled 2021-11-20: qty 5

## 2021-11-20 NOTE — Patient Instructions (Signed)
[facility]  Discharge Instructions: Thank you for choosing [facility] to provide your oncology and hematology care.  If you have a lab appointment with the Cancer Center, please come in thru the Main Entrance and check in at the main information desk.  Wear comfortable clothing and clothing appropriate for easy access to any Portacath or PICC line.   We strive to give you quality time with your provider. You may need to reschedule your appointment if you arrive late (15 or more minutes).  Arriving late affects you and other patients whose appointments are after yours.  Also, if you miss three or more appointments without notifying the office, you may be dismissed from the clinic at the provider's discretion.      For prescription refill requests, have your pharmacy contact our office and allow 72 hours for refills to be completed.        To help prevent nausea and vomiting after your treatment, we encourage you to take your nausea medication as directed.  BELOW ARE SYMPTOMS THAT SHOULD BE REPORTED IMMEDIATELY: *FEVER GREATER THAN 100.4 F (38 C) OR HIGHER *CHILLS OR SWEATING *NAUSEA AND VOMITING THAT IS NOT CONTROLLED WITH YOUR NAUSEA MEDICATION *UNUSUAL SHORTNESS OF BREATH *UNUSUAL BRUISING OR BLEEDING *URINARY PROBLEMS (pain or burning when urinating, or frequent urination) *BOWEL PROBLEMS (unusual diarrhea, constipation, pain near the anus) TENDERNESS IN MOUTH AND THROAT WITH OR WITHOUT PRESENCE OF ULCERS (sore throat, sores in mouth, or a toothache) UNUSUAL RASH, SWELLING OR PAIN  UNUSUAL VAGINAL DISCHARGE OR ITCHING   Items with * indicate a potential emergency and should be followed up as soon as possible or go to the Emergency Department if any problems should occur.  Please show the CHEMOTHERAPY ALERT CARD or IMMUNOTHERAPY ALERT CARD at check-in to the Emergency Department and triage nurse.  Should you have questions after your visit or need to cancel  or reschedule your appointment, please contact [facility] [phone]  and follow the prompts.  Office hours are 8:00 a.m. to 4:30 p.m. Monday - Friday. Please note that voicemails left after 4:00 p.m. may not be returned until the following business day.  We are closed weekends and major holidays. You have access to a nurse at all times for urgent questions. Please call the main number to the clinic [phone] and follow the prompts.  For any non-urgent questions, you may also contact your provider using MyChart. We now offer e-Visits for anyone 18 and older to request care online for non-urgent symptoms. For details visit [email or web address].   Also download the MyChart app! Go to the app store, search "MyChart", open the app, select [facility], and log in with your MyChart username and password.  Due to Covid, a mask is required upon entering the hospital/clinic. If you do not have a mask, one will be given to you upon arrival. For doctor visits, patients may have 1 support person aged 18 or older with them. For treatment visits, patients cannot have anyone with them due to current Covid guidelines and our immunocompromised population.  

## 2021-11-20 NOTE — Progress Notes (Signed)
Patient presents today for Vidaza infusion.  Patient is in satisfactory condition with no complaints voiced.  Vital signs are stable.  We will proceed with treatment per MD orders.  Patient tolerated treatment well with no complaints voiced.  Patient left ambulatory in stable condition.  Vital signs stable at discharge.  Follow up as scheduled.    

## 2021-11-23 ENCOUNTER — Inpatient Hospital Stay (HOSPITAL_COMMUNITY): Payer: Medicare HMO

## 2021-11-23 ENCOUNTER — Ambulatory Visit (HOSPITAL_COMMUNITY): Payer: Medicare HMO | Admitting: Dietician

## 2021-11-23 ENCOUNTER — Encounter (HOSPITAL_COMMUNITY): Payer: Self-pay

## 2021-11-23 VITALS — BP 128/69 | HR 80 | Temp 97.5°F | Resp 18

## 2021-11-23 DIAGNOSIS — C9310 Chronic myelomonocytic leukemia not having achieved remission: Secondary | ICD-10-CM

## 2021-11-23 DIAGNOSIS — D469 Myelodysplastic syndrome, unspecified: Secondary | ICD-10-CM

## 2021-11-23 DIAGNOSIS — Z95828 Presence of other vascular implants and grafts: Secondary | ICD-10-CM

## 2021-11-23 DIAGNOSIS — D649 Anemia, unspecified: Secondary | ICD-10-CM

## 2021-11-23 DIAGNOSIS — Z5111 Encounter for antineoplastic chemotherapy: Secondary | ICD-10-CM | POA: Diagnosis not present

## 2021-11-23 LAB — COMPREHENSIVE METABOLIC PANEL
ALT: 11 U/L (ref 0–44)
AST: 14 U/L — ABNORMAL LOW (ref 15–41)
Albumin: 3.4 g/dL — ABNORMAL LOW (ref 3.5–5.0)
Alkaline Phosphatase: 45 U/L (ref 38–126)
Anion gap: 7 (ref 5–15)
BUN: 25 mg/dL — ABNORMAL HIGH (ref 8–23)
CO2: 25 mmol/L (ref 22–32)
Calcium: 8.8 mg/dL — ABNORMAL LOW (ref 8.9–10.3)
Chloride: 104 mmol/L (ref 98–111)
Creatinine, Ser: 1.2 mg/dL (ref 0.61–1.24)
GFR, Estimated: 59 mL/min — ABNORMAL LOW (ref >60)
Glucose, Bld: 141 mg/dL — ABNORMAL HIGH (ref 70–99)
Potassium: 3.7 mmol/L (ref 3.5–5.1)
Sodium: 136 mmol/L (ref 135–145)
Total Bilirubin: 0.7 mg/dL (ref 0.3–1.2)
Total Protein: 6.3 g/dL — ABNORMAL LOW (ref 6.5–8.1)

## 2021-11-23 LAB — SAMPLE TO BLOOD BANK

## 2021-11-23 LAB — CBC WITH DIFFERENTIAL/PLATELET
Abs Immature Granulocytes: 0.28 10*3/uL — ABNORMAL HIGH (ref 0.00–0.07)
Basophils Absolute: 0 10*3/uL (ref 0.0–0.1)
Basophils Relative: 0 %
Eosinophils Absolute: 0 10*3/uL (ref 0.0–0.5)
Eosinophils Relative: 0 %
HCT: 22.9 % — ABNORMAL LOW (ref 39.0–52.0)
Hemoglobin: 7.6 g/dL — ABNORMAL LOW (ref 13.0–17.0)
Immature Granulocytes: 4 %
Lymphocytes Relative: 31 %
Lymphs Abs: 2.2 10*3/uL (ref 0.7–4.0)
MCH: 31.8 pg (ref 26.0–34.0)
MCHC: 33.2 g/dL (ref 30.0–36.0)
MCV: 95.8 fL (ref 80.0–100.0)
Monocytes Absolute: 3.2 10*3/uL — ABNORMAL HIGH (ref 0.1–1.0)
Monocytes Relative: 47 %
Neutro Abs: 1.2 10*3/uL — ABNORMAL LOW (ref 1.7–7.7)
Neutrophils Relative %: 18 %
Platelets: 50 10*3/uL — ABNORMAL LOW (ref 150–400)
RBC: 2.39 MIL/uL — ABNORMAL LOW (ref 4.22–5.81)
RDW: 18.1 % — ABNORMAL HIGH (ref 11.5–15.5)
WBC: 6.9 10*3/uL (ref 4.0–10.5)
nRBC: 0.9 % — ABNORMAL HIGH (ref 0.0–0.2)

## 2021-11-23 LAB — PREPARE RBC (CROSSMATCH)

## 2021-11-23 LAB — MAGNESIUM: Magnesium: 2 mg/dL (ref 1.7–2.4)

## 2021-11-23 MED ORDER — SODIUM CHLORIDE 0.9 % IV SOLN
10.0000 mg | Freq: Once | INTRAVENOUS | Status: AC
  Administered 2021-11-23: 10 mg via INTRAVENOUS
  Filled 2021-11-23: qty 1

## 2021-11-23 MED ORDER — ACETAMINOPHEN 325 MG PO TABS
650.0000 mg | ORAL_TABLET | Freq: Once | ORAL | Status: AC
  Administered 2021-11-23: 650 mg via ORAL
  Filled 2021-11-23: qty 2

## 2021-11-23 MED ORDER — SODIUM CHLORIDE 0.9 % IV SOLN: Freq: Once | INTRAVENOUS | Status: AC

## 2021-11-23 MED ORDER — HEPARIN SOD (PORK) LOCK FLUSH 100 UNIT/ML IV SOLN
500.0000 [IU] | Freq: Once | INTRAVENOUS | Status: AC | PRN
  Administered 2021-11-23: 500 [IU]

## 2021-11-23 MED ORDER — SODIUM CHLORIDE 0.9% FLUSH
10.0000 mL | INTRAVENOUS | Status: DC | PRN
  Administered 2021-11-23: 10 mL

## 2021-11-23 MED ORDER — SODIUM CHLORIDE 0.9 % IV SOLN
55.0000 mg/m2 | Freq: Once | INTRAVENOUS | Status: AC
  Administered 2021-11-23: 100 mg via INTRAVENOUS
  Filled 2021-11-23: qty 10

## 2021-11-23 MED ORDER — PALONOSETRON HCL INJECTION 0.25 MG/5ML
0.2500 mg | Freq: Once | INTRAVENOUS | Status: AC
  Administered 2021-11-23: 0.25 mg via INTRAVENOUS
  Filled 2021-11-23: qty 5

## 2021-11-23 MED ORDER — SODIUM CHLORIDE 0.9% IV SOLUTION
250.0000 mL | Freq: Once | INTRAVENOUS | Status: AC
  Administered 2021-11-23: 250 mL via INTRAVENOUS

## 2021-11-23 MED ORDER — DIPHENHYDRAMINE HCL 25 MG PO CAPS
25.0000 mg | ORAL_CAPSULE | Freq: Once | ORAL | Status: AC
  Administered 2021-11-23: 25 mg via ORAL
  Filled 2021-11-23: qty 1

## 2021-11-23 NOTE — Progress Notes (Signed)
Ok to proceed with today's labs. ? ?T.O. Dr Rhys Martini, PharmD ?

## 2021-11-23 NOTE — Patient Instructions (Signed)
[facility]  Discharge Instructions: ?Thank you for choosing [facility] to provide your oncology and hematology care.  ?If you have a lab appointment with the [facility], please come in thru the Main Entrance and check in at the main information desk. ? ?Wear comfortable clothing and clothing appropriate for easy access to any Portacath or PICC line.  ? ?We strive to give you quality time with your provider. You may need to reschedule your appointment if you arrive late (15 or more minutes).  Arriving late affects you and other patients whose appointments are after yours.  Also, if you miss three or more appointments without notifying the office, you may be dismissed from the clinic at the provider?s discretion.    ?  ?For prescription refill requests, have your pharmacy contact our office and allow 72 hours for refills to be completed.   ? ?Today you received the following chemotherapy and/or immunotherapy agents Vidaza, and 1 unit of Blood, return as scheduled. ?  ?To help prevent nausea and vomiting after your treatment, we encourage you to take your nausea medication as directed. ? ?BELOW ARE SYMPTOMS THAT SHOULD BE REPORTED IMMEDIATELY: ?*FEVER GREATER THAN 100.4 F (38 ?C) OR HIGHER ?*CHILLS OR SWEATING ?*NAUSEA AND VOMITING THAT IS NOT CONTROLLED WITH YOUR NAUSEA MEDICATION ?*UNUSUAL SHORTNESS OF BREATH ?*UNUSUAL BRUISING OR BLEEDING ?*URINARY PROBLEMS (pain or burning when urinating, or frequent urination) ?*BOWEL PROBLEMS (unusual diarrhea, constipation, pain near the anus) ?TENDERNESS IN MOUTH AND THROAT WITH OR WITHOUT PRESENCE OF ULCERS (sore throat, sores in mouth, or a toothache) ?UNUSUAL RASH, SWELLING OR PAIN  ?UNUSUAL VAGINAL DISCHARGE OR ITCHING  ? ?Items with * indicate a potential emergency and should be followed up as soon as possible or go to the Emergency Department if any problems should occur. ? ?Please show the CHEMOTHERAPY ALERT CARD or IMMUNOTHERAPY ALERT  CARD at check-in to the Emergency Department and triage nurse. ? ?Should you have questions after your visit or need to cancel or reschedule your appointment, please contact [facility] [phone]  and follow the prompts.  Office hours are 8:00 a.m. to 4:30 p.m. Monday - Friday. Please note that voicemails left after 4:00 p.m. may not be returned until the following business day.  We are closed weekends and major holidays. You have access to a nurse at all times for urgent questions. Please call the main number to the clinic [phone] and follow the prompts. ? ?For any non-urgent questions, you may also contact your provider using MyChart. We now offer e-Visits for anyone 78 and older to request care online for non-urgent symptoms. For details visit [email or web address]. ?  ?Also download the MyChart app! Go to the app store, search "MyChart", open the app, select [facility], and log in with your MyChart username and password. ? ?Due to Covid, a mask is required upon entering the hospital/clinic. If you do not have a mask, one will be given to you upon arrival. For doctor visits, patients may have 1 support person aged 69 or older with them. For treatment visits, patients cannot have anyone with them due to current Covid guidelines and our immunocompromised population.  ?

## 2021-11-23 NOTE — Progress Notes (Signed)
Nutrition Assessment ? ? ?Reason for Assessment: Referral (poor appetite, wt loss) ? ? ?ASSESSMENT: 88 year old male with chronic myelomonocytic leukemia (diagnosed 10/01/20). He is receiving azacitidine q28d. Patient is under the care of Dr. Delton Coombes.  ? ?Past medical history includes arthritis, left eye blindness, HTN, PSVT, acute anemia ? ?Met with patient during infusion. He reports decreased appetite as well as altered taste since start of treatment. Some foods are not "tasting right." Patient tries to eat 3 meals, but usually ends up eating 2 smaller meals. He will snack on fruit (grapes, oranges, peaches) in between meals. Patient had a bowl of oatmeal this morning. Sometimes he eats eggs and sausage or grits. Patient recalls sub and string beans for supper. He did not eat much of this, because it did not taste right. Patient reports his daughter purchases cases of Ensure Complete for him to drink. He has not been drinking them regularly. Patient has tried mixing them with ice cream. This taste pretty good. Patient has upper partial, he is brushing once daily in the morning. He denies nausea, vomiting, diarrhea, constipation. Patient is taking daily stool softener.  ? ?Nutrition Focused Physical Exam:  ? ?Orbital Region: moderate ?Buccal Region: moderate ?Upper Arm Region: moderate ?Thoracic and Lumbar Region: moderate ?Temple Region: mild ?Clavicle Bone Region: moderate ?Shoulder and Acromion Bone Region: moderate  ?Dorsal Hand: moderate  ?Hair: reviewed ?Eyes: reviewed  ?Skin: reviewed  ?Nails: reviewed  ? ? ?Medications: clonidine, hydrochlorothiazide, xyzal, lisinopril, compazine, B12, protonix, vit D ? ? ?Labs: glucose 141, BUN 25 ? ? ?Anthropometrics: Weights have decreased 5.6% from usual weight in 8 weeks. Weights have decreased ~2% in the last 7 days. Patient weighted 156 lb 12.8 oz on 4/10. This is significant for time frame.  ? ?Height: 5'9" ?Weight: 153 lb 9.6 oz ?UBW: 162 lb (10/01/21) ?BMI:  22.68 ? ? ? ?NUTRITION DIAGNOSIS: Inadequate oral intake related to side effects of chemotherapy as evidenced by reported altered taste, decreased appetite, significant 2% weight loss in 7 days.  ? ? 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 He is at risk for worsening nutritional status given advanced age, decreased appetite, and weight trends ? ? ?INTERVENTION:  ?Discussed importance of adequate calorie and protein energy intake to maintain weight/strength ?Encouraged small frequent meals and snacks vs 2-3 larger meals, provided suggestions as well as handout with snack ideas and soft moist high protein foods ?Continue drinking Ensure Complete, recommend 2/day - coupons provided ?Educated on importance or oral care and tips for altered taste, recommend trying baking soda salt water rinses several times/day before meals - recipe and handout provided ?Contact information given  ? ? ?MONITORING, EVALUATION, GOAL: Patient will tolerate increased calories and protein to minimize further weight loss  ? ? ?Next Visit: Monday May 8 during infusion ? ? ? ? ? ? ?

## 2021-11-23 NOTE — Progress Notes (Signed)
Patient presents today for D6 of Vidaza. Patient okay to proceed with today's labs per Dr. Delton Coombes with an additional order for 1 unit of blood, d/t hemoglobin of 7.6. Pharmacy aware  ?Patient tolerated chemotherapy with no complaints voiced. Side effects with management reviewed understanding verbalized. Port site clean and dry with no bruising or swelling noted at site. Good blood return noted before and after administration of chemotherapy.  ?Patient tolerated 1 unit of PRBCs with no complaints voiced. Side effects with management reviewed with understanding verbalized. Port site clean and dry with no bruising or swelling noted at site. Good blood return noted before and after administration of therapy. Patient left in satisfactory condition with VSS and no s/s of distress noted.  ?

## 2021-11-24 ENCOUNTER — Inpatient Hospital Stay (HOSPITAL_COMMUNITY): Payer: Medicare HMO

## 2021-11-24 VITALS — BP 125/62 | HR 89 | Temp 97.6°F | Resp 18

## 2021-11-24 DIAGNOSIS — Z5111 Encounter for antineoplastic chemotherapy: Secondary | ICD-10-CM | POA: Diagnosis not present

## 2021-11-24 DIAGNOSIS — Z95828 Presence of other vascular implants and grafts: Secondary | ICD-10-CM

## 2021-11-24 DIAGNOSIS — C9310 Chronic myelomonocytic leukemia not having achieved remission: Secondary | ICD-10-CM

## 2021-11-24 LAB — BPAM RBC
Blood Product Expiration Date: 202305182359
ISSUE DATE / TIME: 202304171111
Unit Type and Rh: 5100

## 2021-11-24 LAB — TYPE AND SCREEN
ABO/RH(D): B POS
Antibody Screen: NEGATIVE
Unit division: 0

## 2021-11-24 MED ORDER — HEPARIN SOD (PORK) LOCK FLUSH 100 UNIT/ML IV SOLN
500.0000 [IU] | Freq: Once | INTRAVENOUS | Status: AC | PRN
  Administered 2021-11-24: 500 [IU]

## 2021-11-24 MED ORDER — SODIUM CHLORIDE 0.9 % IV SOLN
10.0000 mg | Freq: Once | INTRAVENOUS | Status: AC
  Administered 2021-11-24: 10 mg via INTRAVENOUS
  Filled 2021-11-24: qty 10

## 2021-11-24 MED ORDER — SODIUM CHLORIDE 0.9 % IV SOLN: Freq: Once | INTRAVENOUS | Status: AC

## 2021-11-24 MED ORDER — SODIUM CHLORIDE 0.9% FLUSH
10.0000 mL | INTRAVENOUS | Status: DC | PRN
  Administered 2021-11-24: 10 mL

## 2021-11-24 MED ORDER — SODIUM CHLORIDE 0.9 % IV SOLN
55.0000 mg/m2 | Freq: Once | INTRAVENOUS | Status: AC
  Administered 2021-11-24: 100 mg via INTRAVENOUS
  Filled 2021-11-24: qty 10

## 2021-11-24 NOTE — Patient Instructions (Signed)
[facility]  Discharge Instructions: ?Thank you for choosing [facility] to provide your oncology and hematology care.  ?If you have a lab appointment with the [facility], please come in thru the Main Entrance and check in at the main information desk. ? ?Wear comfortable clothing and clothing appropriate for easy access to any Portacath or PICC line.  ? ?We strive to give you quality time with your provider. You may need to reschedule your appointment if you arrive late (15 or more minutes).  Arriving late affects you and other patients whose appointments are after yours.  Also, if you miss three or more appointments without notifying the office, you may be dismissed from the clinic at the provider?s discretion.    ?  ?For prescription refill requests, have your pharmacy contact our office and allow 72 hours for refills to be completed.   ? ?Today you received the following chemotherapy and/or immunotherapy agents D7 Vidaza. ?  ?To help prevent nausea and vomiting after your treatment, we encourage you to take your nausea medication as directed. ? ?BELOW ARE SYMPTOMS THAT SHOULD BE REPORTED IMMEDIATELY: ?*FEVER GREATER THAN 100.4 F (38 ?C) OR HIGHER ?*CHILLS OR SWEATING ?*NAUSEA AND VOMITING THAT IS NOT CONTROLLED WITH YOUR NAUSEA MEDICATION ?*UNUSUAL SHORTNESS OF BREATH ?*UNUSUAL BRUISING OR BLEEDING ?*URINARY PROBLEMS (pain or burning when urinating, or frequent urination) ?*BOWEL PROBLEMS (unusual diarrhea, constipation, pain near the anus) ?TENDERNESS IN MOUTH AND THROAT WITH OR WITHOUT PRESENCE OF ULCERS (sore throat, sores in mouth, or a toothache) ?UNUSUAL RASH, SWELLING OR PAIN  ?UNUSUAL VAGINAL DISCHARGE OR ITCHING  ? ?Items with * indicate a potential emergency and should be followed up as soon as possible or go to the Emergency Department if any problems should occur. ? ?Please show the CHEMOTHERAPY ALERT CARD or IMMUNOTHERAPY ALERT CARD at check-in to the Emergency  Department and triage nurse. ? ?Should you have questions after your visit or need to cancel or reschedule your appointment, please contact [facility] [phone]  and follow the prompts.  Office hours are 8:00 a.m. to 4:30 p.m. Monday - Friday. Please note that voicemails left after 4:00 p.m. may not be returned until the following business day.  We are closed weekends and major holidays. You have access to a nurse at all times for urgent questions. Please call the main number to the clinic [phone] and follow the prompts. ? ?For any non-urgent questions, you may also contact your provider using MyChart. We now offer e-Visits for anyone 26 and older to request care online for non-urgent symptoms. For details visit [email or web address]. ?  ?Also download the MyChart app! Go to the app store, search "MyChart", open the app, select [facility], and log in with your MyChart username and password. ? ?Due to Covid, a mask is required upon entering the hospital/clinic. If you do not have a mask, one will be given to you upon arrival. For doctor visits, patients may have 1 support person aged 79 or older with them. For treatment visits, patients cannot have anyone with them due to current Covid guidelines and our immunocompromised population.  ?

## 2021-11-24 NOTE — Progress Notes (Signed)
Pt presents today for D7 Vidaza per provider's order. Vital signs stable and pt voiced no new complaints at this time.   D7 Vidaza given today per MD orders. Tolerated infusion without adverse affects. Vital signs stable. No complaints at this time. Discharged from clinic ambulatory in stable condition. Alert and oriented x 3. F/U with [facility] as scheduled.   

## 2021-11-24 NOTE — Progress Notes (Signed)
Chaplain engaged in an initial visit with Dustin Tucker.  He shared about his faith and love for God.  He voiced that he meditates on the word of God day and night.  He stated several times that he loves Jesus.  Chaplain honored his love of God and his faith.   ? ?Chaplain offered listening and support. ? ? ? 11/24/21 1100  ?Clinical Encounter Type  ?Visited With Patient  ?Visit Type Initial  ? ? ?

## 2021-11-30 ENCOUNTER — Emergency Department (HOSPITAL_COMMUNITY)
Admission: EM | Admit: 2021-11-30 | Discharge: 2021-11-30 | Disposition: A | Discharge status: Home / Self Care | Payer: Medicare HMO | Attending: Emergency Medicine | Admitting: Emergency Medicine

## 2021-11-30 ENCOUNTER — Encounter (HOSPITAL_COMMUNITY): Payer: Self-pay | Admitting: *Deleted

## 2021-11-30 ENCOUNTER — Other Ambulatory Visit: Payer: Self-pay

## 2021-11-30 ENCOUNTER — Inpatient Hospital Stay (HOSPITAL_COMMUNITY): Payer: Medicare HMO

## 2021-11-30 DIAGNOSIS — R531 Weakness: Secondary | ICD-10-CM | POA: Diagnosis not present

## 2021-11-30 DIAGNOSIS — R197 Diarrhea, unspecified: Secondary | ICD-10-CM | POA: Insufficient documentation

## 2021-11-30 DIAGNOSIS — R1030 Lower abdominal pain, unspecified: Secondary | ICD-10-CM | POA: Insufficient documentation

## 2021-11-30 DIAGNOSIS — R339 Retention of urine, unspecified: Secondary | ICD-10-CM | POA: Insufficient documentation

## 2021-11-30 DIAGNOSIS — R338 Other retention of urine: Secondary | ICD-10-CM

## 2021-11-30 LAB — URINALYSIS, ROUTINE W REFLEX MICROSCOPIC
Bacteria, UA: NONE SEEN
Bilirubin Urine: NEGATIVE
Glucose, UA: NEGATIVE mg/dL
Ketones, ur: NEGATIVE mg/dL
Leukocytes,Ua: NEGATIVE
Nitrite: NEGATIVE
Protein, ur: NEGATIVE mg/dL
Specific Gravity, Urine: 1.012 (ref 1.005–1.030)
pH: 5 (ref 5.0–8.0)

## 2021-11-30 NOTE — Discharge Instructions (Signed)
Your bladder has been drained and a Foley catheter has been placed.  Leave the catheter in place until you are seen by urology.  Call your urologist tomorrow to arrange a follow-up appointment. ?

## 2021-11-30 NOTE — ED Triage Notes (Signed)
Pt states he has not been able to urinate since 10am today; pt c/o lower abdominal pain ?

## 2021-11-30 NOTE — ED Notes (Signed)
837m of urine in bladder  ? ?

## 2021-11-30 NOTE — ED Provider Notes (Signed)
?[facility] ?Provider Note ? ? ?CSN: [id number] ?Arrival date & time: 11/30/21  1520 ? ?  ? ?History ? ?Chief Complaint  ?Patient presents with  ? Urinary Retention  ? ? ?Dustin Tucker is a 88 y.o. male. ? ?HPI ? ?  ? ? ?Dustin Tucker is a 88 y.o. male who presents to the Emergency Department complaining of acute urinary retention.  States he last voided at 10 AM this morning.  He took a laxative earlier today, he has had small amounts of stool.  But since taking the Dulcolax he believes this has caused his urinary retention.  He reports having pressure and discomfort of his lower abdomen.  No nausea vomiting or fever. ? ? ?Home Medications ?Prior to Admission medications   ?Medication Sig Start Date End Date Taking? Authorizing Provider  ?albuterol (VENTOLIN HFA) 108 (90 Base) MCG/ACT inhaler Inhale 2 puffs into the lungs every 6 (six) hours as needed for wheezing or shortness of breath. ?Patient not taking: Reported on 11/24/2021 07/28/21   [provider]  ?azaCITIDine 5 mg/2 mLs in lactated ringers infusion Inject 75 mg/m2 into the vein daily. Days 1-5 every 28 days 10/19/21   [provider]  ?cholecalciferol (VITAMIN D) 1000 units tablet Take 1,000 Units by mouth daily.    [provider]  ?cloNIDine (CATAPRES) 0.1 MG tablet Take 0.1 mg by mouth daily. 05/16/18   [provider]  ?hydrochlorothiazide (HYDRODIURIL) 25 MG tablet Take 25 mg by mouth daily.  06/25/16   [provider]  ?levocetirizine (XYZAL) 5 MG tablet Take 5 mg by mouth daily. 07/28/21   [provider]  ?lidocaine-prilocaine (EMLA) cream Apply a dime-sized amount to port a cath site (do not rub in) and cover with plastic wrap one hour prior to infusion appointments 10/15/21   Derek Jack, MD  ?lisinopril (PRINIVIL,ZESTRIL) 40 MG tablet Take 40 mg by mouth daily.  06/09/16   [provider]  ?pantoprazole (PROTONIX) 40 MG tablet Take 1 tablet (40 mg total)  by mouth daily. 08/09/21   Cherene Altes, MD  ?prochlorperazine (COMPAZINE) 10 MG tablet TAKE (1) TABLET BY MOUTH EVERY SIX HOURS AS NEEDED. 11/12/21   Derek Jack, MD  ?vitamin B-12 (CYANOCOBALAMIN) 1000 MCG tablet Take 1,000 mcg by mouth daily.    [provider]  ?   ? ?Allergies    ?Aspirin and Flomax [tamsulosin]   ? ?Review of Systems   ?Review of Systems  ?Constitutional:  Negative for appetite change, chills and fever.  ?Respiratory:  Negative for cough and shortness of breath.   ?Cardiovascular:  Negative for chest pain.  ?Gastrointestinal:  Positive for abdominal pain and constipation. Negative for nausea and vomiting.  ?Genitourinary:  Positive for difficulty urinating. Negative for flank pain.  ?Musculoskeletal:  Negative for arthralgias and myalgias.  ?Neurological:  Negative for weakness and numbness.  ?All other systems reviewed and are negative. ? ?Physical Exam ?Updated Vital Signs ?BP (!) 162/90 (BP Location: Right Arm)   Pulse (!) 110   Temp 98 ?F (36.7 ?C) (Oral)   Resp 16   Ht '5\' 9"'$  (1.753 m)   Wt 69.6 kg   SpO2 100%   BMI 22.66 kg/m?  ?Physical Exam ?Vitals and nursing note reviewed.  ?Constitutional:   ?   Appearance: Normal appearance. He is not toxic-appearing.  ?Cardiovascular:  ?   Rate and Rhythm: Normal rate and regular rhythm.  ?   Pulses: Normal pulses.  ?Pulmonary:  ?  Effort: Pulmonary effort is normal.  ?Abdominal:  ?   General: There is distension.  ?   Tenderness: There is abdominal tenderness.  ?   Comments: Mild distention and tenderness to palpation of the lower abdomen.  No CVA tenderness guarding or rebound.  ?Skin: ?   General: Skin is warm.  ?   Capillary Refill: Capillary refill takes less than 2 seconds.  ?Neurological:  ?   General: No focal deficit present.  ?   Mental Status: He is alert.  ?   Sensory: No sensory deficit.  ?   Motor: Weakness present.  ? ? ?ED Results / Procedures / Treatments   ?Labs ?(all labs ordered are listed, but only  abnormal results are displayed) ?Labs Reviewed  ?URINALYSIS, ROUTINE W REFLEX MICROSCOPIC - Abnormal; Notable for the following components:  ?    Result Value  ? Hgb urine dipstick SMALL (*)   ? All other components within normal limits  ? ? ?EKG ?None ? ?Radiology ?No results found. ? ?Procedures ?Procedures  ? ? ?Medications Ordered in ED ?Medications - No data to display ? ?ED Course/ Medical Decision Making/ A&P ?  ?                        ?Medical Decision Making ?Amount and/or Complexity of Data Reviewed ?Labs: ordered. ? ? ?Patient here for complaint of urinary retention.  Last void was 10 AM this morning.  He took Dulcolax earlier today for constipation states his urinary retention started shortly after.  He has had several small loose stools he states that give him temporary relief of his urinary symptoms.  He denies any fever, chills, nausea or vomiting. ? ?Bladder scan reveals 850 cc urine in the bladder.  Foley catheter ordered. ? ?Foley catheter was inserted by nursing staff without complication.   ? ?On recheck, patient reports pain is resolved and he is feeling much more comfortable.  He has had 1000 cc of straw-colored urine within the Foley catheter bag. ?He will follow-up closely with urology urinalysis without evidence of infection. ? ? ? ? ? ? ? ? ?Final Clinical Impression(s) / ED Diagnoses ?Final diagnoses:  ?Acute urinary retention  ? ? ?Rx / DC Orders ?ED Discharge Orders   ? ? None  ? ?  ? ? ?  ?Kem Parkinson, PA-C ?11/30/21 1940 ? ?  ?Godfrey Pick, MD ?12/02/21 0205 ? ?

## 2021-12-01 ENCOUNTER — Inpatient Hospital Stay (HOSPITAL_COMMUNITY): Payer: Medicare HMO

## 2021-12-01 ENCOUNTER — Encounter (HOSPITAL_COMMUNITY): Payer: Self-pay

## 2021-12-01 VITALS — BP 111/60 | HR 87 | Temp 98.3°F | Resp 18

## 2021-12-01 DIAGNOSIS — K5903 Drug induced constipation: Secondary | ICD-10-CM

## 2021-12-01 DIAGNOSIS — C9310 Chronic myelomonocytic leukemia not having achieved remission: Secondary | ICD-10-CM

## 2021-12-01 DIAGNOSIS — D469 Myelodysplastic syndrome, unspecified: Secondary | ICD-10-CM

## 2021-12-01 DIAGNOSIS — D649 Anemia, unspecified: Secondary | ICD-10-CM

## 2021-12-01 DIAGNOSIS — Z5111 Encounter for antineoplastic chemotherapy: Secondary | ICD-10-CM | POA: Diagnosis not present

## 2021-12-01 LAB — SAMPLE TO BLOOD BANK

## 2021-12-01 LAB — PREPARE RBC (CROSSMATCH)

## 2021-12-01 MED ORDER — SODIUM CHLORIDE 0.9% IV SOLUTION
250.0000 mL | Freq: Once | INTRAVENOUS | Status: AC
  Administered 2021-12-01: 250 mL via INTRAVENOUS

## 2021-12-01 MED ORDER — SODIUM CHLORIDE 0.9% FLUSH
10.0000 mL | INTRAVENOUS | Status: AC | PRN
  Administered 2021-12-01: 10 mL

## 2021-12-01 MED ORDER — DOCUSATE SODIUM 100 MG PO CAPS: 100.0000 mg | ORAL_CAPSULE | Freq: Two times a day (BID) | ORAL | 1 refills | Status: DC

## 2021-12-01 MED ORDER — DIPHENHYDRAMINE HCL 25 MG PO CAPS
25.0000 mg | ORAL_CAPSULE | Freq: Once | ORAL | Status: AC
  Administered 2021-12-01: 25 mg via ORAL
  Filled 2021-12-01: qty 1

## 2021-12-01 MED ORDER — HEPARIN SOD (PORK) LOCK FLUSH 100 UNIT/ML IV SOLN
500.0000 [IU] | Freq: Every day | INTRAVENOUS | Status: AC | PRN
  Administered 2021-12-01: 500 [IU]

## 2021-12-01 MED ORDER — ACETAMINOPHEN 325 MG PO TABS
650.0000 mg | ORAL_TABLET | Freq: Once | ORAL | Status: AC
  Administered 2021-12-01: 650 mg via ORAL
  Filled 2021-12-01: qty 2

## 2021-12-01 NOTE — Progress Notes (Signed)
Patients port flushed without difficulty.  Good blood return noted with no bruising or swelling noted at site.  Stable during access and blood draw.  Patient to remain accessed for treatment. 

## 2021-12-01 NOTE — Progress Notes (Signed)
Patient tolerated blood transfusion with no complaints voiced. Side effects with management reviewed with understanding verbalized. Port site clean and dry with no bruising or swelling noted at site. Good blood return noted before and after administration of therapy. Band aid applied. Patient left in satisfactory condition with VSS and no s/s of distress noted. 

## 2021-12-01 NOTE — Patient Instructions (Signed)
[facility]  Discharge Instructions: ?Thank you for choosing [facility] to provide your oncology and hematology care.  ?If you have a lab appointment with the [facility], please come in thru the Main Entrance and check in at the main information desk. ? ?Wear comfortable clothing and clothing appropriate for easy access to any Portacath or PICC line.  ? ?We strive to give you quality time with your provider. You may need to reschedule your appointment if you arrive late (15 or more minutes).  Arriving late affects you and other patients whose appointments are after yours.  Also, if you miss three or more appointments without notifying the office, you may be dismissed from the clinic at the provider?s discretion.    ?  ?For prescription refill requests, have your pharmacy contact our office and allow 72 hours for refills to be completed.   ? ?Today you received the following  1 unit of PRBCs, return as scheduled. ?A prescription for Colace was sent to your pharmacy. Take 2 capsules daily to help soften your stool. If this doesn't work, you may increase your Colace to 2 capsules in the morning and 2 capsules in the evening. ?  ?To help prevent nausea and vomiting after your treatment, we encourage you to take your nausea medication as directed. ? ?BELOW ARE SYMPTOMS THAT SHOULD BE REPORTED IMMEDIATELY: ?*FEVER GREATER THAN 100.4 F (38 ?C) OR HIGHER ?*CHILLS OR SWEATING ?*NAUSEA AND VOMITING THAT IS NOT CONTROLLED WITH YOUR NAUSEA MEDICATION ?*UNUSUAL SHORTNESS OF BREATH ?*UNUSUAL BRUISING OR BLEEDING ?*URINARY PROBLEMS (pain or burning when urinating, or frequent urination) ?*BOWEL PROBLEMS (unusual diarrhea, constipation, pain near the anus) ?TENDERNESS IN MOUTH AND THROAT WITH OR WITHOUT PRESENCE OF ULCERS (sore throat, sores in mouth, or a toothache) ?UNUSUAL RASH, SWELLING OR PAIN  ?UNUSUAL VAGINAL DISCHARGE OR ITCHING  ? ?Items with * indicate a potential emergency and should be  followed up as soon as possible or go to the Emergency Department if any problems should occur. ? ?Please show the CHEMOTHERAPY ALERT CARD or IMMUNOTHERAPY ALERT CARD at check-in to the Emergency Department and triage nurse. ? ?Should you have questions after your visit or need to cancel or reschedule your appointment, please contact [facility] [phone]  and follow the prompts.  Office hours are 8:00 a.m. to 4:30 p.m. Monday - Friday. Please note that voicemails left after 4:00 p.m. may not be returned until the following business day.  We are closed weekends and major holidays. You have access to a nurse at all times for urgent questions. Please call the main number to the clinic [phone] and follow the prompts. ? ?For any non-urgent questions, you may also contact your provider using MyChart. We now offer e-Visits for anyone 77 and older to request care online for non-urgent symptoms. For details visit [email or web address]. ?  ?Also download the MyChart app! Go to the app store, search "MyChart", open the app, select [facility], and log in with your MyChart username and password. ? ?Due to Covid, a mask is required upon entering the hospital/clinic. If you do not have a mask, one will be given to you upon arrival. For doctor visits, patients may have 1 support person aged 61 or older with them. For treatment visits, patients cannot have anyone with them due to current Covid guidelines and our immunocompromised population.  ?

## 2021-12-02 LAB — CBC
HCT: 23.8 % — ABNORMAL LOW (ref 39.0–52.0)
Hemoglobin: 7.7 g/dL — ABNORMAL LOW (ref 13.0–17.0)
MCH: 31 pg (ref 26.0–34.0)
MCHC: 32.4 g/dL (ref 30.0–36.0)
MCV: 96 fL (ref 80.0–100.0)
Platelets: 45 10*3/uL — ABNORMAL LOW (ref 150–400)
RBC: 2.48 MIL/uL — ABNORMAL LOW (ref 4.22–5.81)
RDW: 17.9 % — ABNORMAL HIGH (ref 11.5–15.5)
WBC: 12.1 10*3/uL — ABNORMAL HIGH (ref 4.0–10.5)
nRBC: 0.4 % — ABNORMAL HIGH (ref 0.0–0.2)

## 2021-12-02 LAB — BPAM RBC
Blood Product Expiration Date: 202305202359
ISSUE DATE / TIME: 202304251120
Unit Type and Rh: 1700

## 2021-12-02 LAB — TYPE AND SCREEN
ABO/RH(D): B POS
Antibody Screen: NEGATIVE
Unit division: 0

## 2021-12-03 ENCOUNTER — Ambulatory Visit (INDEPENDENT_AMBULATORY_CARE_PROVIDER_SITE_OTHER): Payer: Medicare HMO | Admitting: Physician Assistant

## 2021-12-03 ENCOUNTER — Ambulatory Visit: Payer: Medicare HMO | Admitting: Physician Assistant

## 2021-12-03 VITALS — BP 160/73 | HR 103 | Ht 69.0 in | Wt 152.0 lb

## 2021-12-03 DIAGNOSIS — R310 Gross hematuria: Secondary | ICD-10-CM

## 2021-12-03 DIAGNOSIS — R339 Retention of urine, unspecified: Secondary | ICD-10-CM

## 2021-12-03 DIAGNOSIS — N138 Other obstructive and reflux uropathy: Secondary | ICD-10-CM

## 2021-12-03 DIAGNOSIS — N401 Enlarged prostate with lower urinary tract symptoms: Secondary | ICD-10-CM

## 2021-12-03 DIAGNOSIS — R972 Elevated prostate specific antigen [PSA]: Secondary | ICD-10-CM

## 2021-12-03 DIAGNOSIS — D649 Anemia, unspecified: Secondary | ICD-10-CM

## 2021-12-03 LAB — URINALYSIS, ROUTINE W REFLEX MICROSCOPIC
Bilirubin, UA: NEGATIVE
Nitrite, UA: NEGATIVE
Specific Gravity, UA: 1.01 (ref 1.005–1.030)
Urobilinogen, Ur: 4 mg/dL — ABNORMAL HIGH (ref 0.2–1.0)
pH, UA: 5 (ref 5.0–7.5)

## 2021-12-03 LAB — MICROSCOPIC EXAMINATION
Bacteria, UA: NONE SEEN
Epithelial Cells (non renal): NONE SEEN /hpf (ref 0–10)
RBC, Urine: >30 /hpf — AB (ref 0–2)
Renal Epithel, UA: NONE SEEN /hpf
WBC, UA: NONE SEEN /hpf (ref 0–5)

## 2021-12-03 NOTE — Progress Notes (Signed)
Cath Change/ Replacement ? ?Patient is present today for a catheter change due to urinary retention.  75m of water was removed from the balloon, a 16FR foley cath was removed with out difficulty.  Patient was cleaned and prepped in a sterile fashion with betadine. A 24 FR coude foley cath was replaced into the bladder no complications were noted Urine return was noted 556mand urine was red in color. The balloon was filled with 1034mf sterile water. A bed bag was attached for drainage.  A night bag was also given to the patient and patient was given instruction on how to change from one bag to another. Patient was given proper instruction on catheter care.   ? ?Performed by: KouLevi AlandMA ? ?Follow up: Follow up as scheduled.   ? ?Bladder Irrigation ? ?Due to gross hematuria patient is present today for a bladder irrigation. Patient was cleaned and prepped in a sterile fashion. 70 ml of saline/sterile water was instilled and irrigated into the bladder with a 57m41momey syringe through the catheter in place.  After irrigation urine flow was noted no complications were noted catheter is now draining fine.  Catheter was reattached to the bed bag for drainage. Patient tolerated well.  ? ?Performed by: KourLevi AlandA ?Additional notes/ Follow up: Follow up as scheduled.   ?

## 2021-12-03 NOTE — Progress Notes (Signed)
? ?Assessment: ?1. Gross hematuria ?- Urinalysis, Routine w reflex microscopic ?- CBC With differential/Platelet ?- Basic metabolic panel ?- CT HEMATURIA WORKUP ?- Urine Culture ? ?2. Urinary retention ? ?3. Acute anemia ? ?4. BPH with urinary obstruction ? ?5. Elevated PSA ?  ? ?Plan: ?Pt bladder flushed, clots cleared and 79F Coud? cath placed. Foley draining well at DC. Pt returned in the pm with blocked foley and clot cleared with additional irrigation. Home irrigation instructions given.  Urine culture ordered to rule out possibility of infectious process.  No antibiotics indicated at this time.. CT hematuria study ordered and pt will FU next week for cysto. CBC ordered to ensure his hgb is stable and I called Dr. Tomie China office to have them review his hgb as well. Message left with answering service and inbasket sent as well. BMP to eval renal fxn for upcoming CT contrast.  If the patient is unable to clear his Foley and becomes uncomfortable before the office opens tomorrow, he is instructed to go to the emergency department.  He is aware that we have checked his labs today and that he may need follow-up with oncology tomorrow for another transfusion. ? ?Chief Complaint: ?Hematuria ? ?HPI: ?Dustin Tucker is a 88 y.o. male of Dr. Diona Fanti with h/o BPH and elevated PSA who presents for evaluation of sudden onset urinary retention with foley placement on 11/30/21 in the ED. UA clear except 11-20 RBCs. Pt reports tremendous pain with foley placement and noticed a little blood in the urine bag at discharge from the ED. The next day, he noticed and significant amount of blood in the bag which has remained the same since.  He feels that urine continues to drain well and has had no clots.  No abdominal pain at this time. No h/o retention. Pt denies gross hematuria in the past. No fever, chills, weakness. He is not on anticoagulants. The pt reports he had some constipation issues and took Dulcolax just before  the retention issue. Hx also significant for myelodysplasia, tx with Azacitidine and Vidaza. Most recent blood transfusion was 2 days ago-12/01/21 d/t low Hgb. ? ?UA= Brown and turbid with TNTC RBCs, nitrite negative ? ?11/30/21 ?Dustin Tucker is a 88 y.o. male who presents to the Emergency Department complaining of acute urinary retention.  States he last voided at 10 AM this morning.  He took a laxative earlier today, he has had small amounts of stool.  But since taking the Dulcolax he believes this has caused his urinary retention.  He reports having pressure and discomfort of his lower abdomen.  No nausea vomiting or fever. ? ?10/27/21 ?Here for routine follow-up. No recent PSA. 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  He has had no real urinary symptoms.  He has a good stream and feels like he empties well.  No recent urinary tract infection. ? ? ?Portions of the above documentation were copied from a prior visit for review purposes only. ? ?Allergies: ?Allergies  ?Allergen Reactions  ? Aspirin Other (See Comments)  ?  Gi upset, can tolerate baby aspirin  ? Flomax [Tamsulosin] Nausea Only  ? ? ?PMH: ?Past Medical History:  ?Diagnosis Date  ? 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 Hypertension   ? Port-A-Cath in place 10/15/2021  ? ? ?PSH: ?Past Surgical History:  ?Procedure Laterality Date  ? CARPAL TUNNEL RELEASE Right   ? COLONOSCOPY N/A 09/19/2013  ? Procedure:  COLONOSCOPY;  Surgeon: Rogene Houston, MD;  [facility];  Service: Endoscopy;  Laterality: N/A;  830  ? FINGER AMPUTATION Left   ? 4th and 5th  ? left eye blindness Left   ? PORTACATH PLACEMENT Right 10/16/2021  ? Procedure: INSERTION PORT-A-CATH;  Surgeon: Aviva Signs, MD;  Location: [facility];  Service: General;  Laterality: Right;  ? SVT ABLATION N/A 09/21/2018  ? Procedure: SVT ABLATION;  Surgeon: Constance Haw, MD;  Location: [facility] LAB;  Service: Cardiovascular;  Laterality: N/A;   ? ? ?SH: ?Social History  ? ?Tobacco Use  ? Smoking status: Former  ?  Packs/day: 0.50  ?  Types: Cigarettes  ?  Start date: 08/09/1956  ?  Quit date: 08/10/1979  ?  Years since quitting: 42.3  ? Smokeless tobacco: Never  ?Vaping Use  ? Vaping Use: Never used  ?Substance Use Topics  ? Alcohol use: No  ?  Alcohol/week: 0.0 standard drinks  ? Drug use: No  ? ? ?ROS: ?Constitutional:  Negative for fever, chills ?CV: Negative for chest pain ?Respiratory:  Negative for shortness of breath, wheezing, sleep apnea, frequent cough ?GI:  Negative for nausea, vomiting, bloody stool, GERD ? ?PE: ?BP (!) 160/73   Pulse (!) 103   Ht '5\' 9"'$  (1.753 m)   Wt 152 lb (68.9 kg)   BMI 22.45 kg/m?  ?GENERAL APPEARANCE:  Chronically ill appearing, well developed, thin, NAD. Mildly tachycardic ?HEENT:  Atraumatic, normocephalic ?NECK:  Supple. Trachea midline ?ABDOMEN:  Soft, suprapubic tenderness, no masses ?EXTREMITIES:  Moves all extremities well ?NEUROLOGIC:  Alert and oriented x 3, normal gait ?MENTAL STATUS:  appropriate ?BACK:  Non-tender to palpation, No CVAT ?SKIN:  Warm, dry, and intact, no rashes ? ? ?Results: ?Laboratory Data: ?Lab Results  ?Component Value Date  ? WBC 12.1 (H) 12/01/2021  ? HGB 7.7 (L) 12/01/2021  ? HCT 23.8 (L) 12/01/2021  ? MCV 96.0 12/01/2021  ? PLT 45 (L) 12/01/2021  ? ? ?Lab Results  ?Component Value Date  ? CREATININE 1.20 11/23/2021  ? ? ?Lab Results  ?Component Value Date  ? HGBA1C 6.4 (H) 08/08/2021  ? ? ?Urinalysis ?   ?Component Value Date/Time  ? [place] YELLOW 11/30/2021 1755  ? APPEARANCEUR CLEAR 11/30/2021 1755  ? APPEARANCEUR Clear 10/27/2021 1425  ? LABSPEC 1.012 11/30/2021 1755  ? PHURINE 5.0 11/30/2021 1755  ? GLUCOSEU NEGATIVE 11/30/2021 1755  ? HGBUR SMALL (A) 11/30/2021 1755  ? [place] NEGATIVE 11/30/2021 1755  ? BILIRUBINUR Negative 10/27/2021 1425  ? [place] Stain NEGATIVE 11/30/2021 1755  ? [place] NEGATIVE 11/30/2021 1755  ? UROBILINOGEN 0.2 08/24/2014 1720  ? NITRITE NEGATIVE  11/30/2021 1755  ? LEUKOCYTESUR NEGATIVE 11/30/2021 1755  ? ? ?Lab Results  ?Component Value Date  ? LABMICR See below: 10/27/2021  ? [place] None seen 10/27/2021  ? LABEPIT None seen 10/27/2021  ? BACTERIA NONE SEEN 11/30/2021  ? ? ?Pertinent Imaging: ? ? ?No results found for this or any previous visit. ? ?No results found for this or any previous visit (from the past 24 hour(s)).  ?

## 2021-12-03 NOTE — Progress Notes (Signed)
Bladder Irrigation ? ?Due to gross hematuria patient is present today for a bladder irrigation. Patient was cleaned and prepped in a sterile fashion. 500 ml of saline/sterile water was instilled and irrigated into the bladder with a 96m Toomey syringe through the catheter in place.  After irrigation urine flow was noted no complications were noted catheter is now draining fine.  Catheter was reattached to the bed bag for drainage. Patient tolerated well.  ? ?Performed by: KLevi Aland CMA ?Additional notes/ Follow up: Patient instructed on how to flush his catheter, supplies given during visit.  Pt will follow up as needed.  ?

## 2021-12-04 ENCOUNTER — Encounter (HOSPITAL_COMMUNITY): Payer: Self-pay

## 2021-12-04 ENCOUNTER — Encounter (HOSPITAL_COMMUNITY): Payer: Self-pay | Admitting: Hematology

## 2021-12-04 LAB — CBC WITH DIFFERENTIAL
Basophils Absolute: 0.2 10*3/uL (ref 0.0–0.2)
Basos: 5 %
EOS (ABSOLUTE): 0 10*3/uL (ref 0.0–0.4)
Eos: 0 %
Hematocrit: 25.8 % — ABNORMAL LOW (ref 37.5–51.0)
Hemoglobin: 8.8 g/dL — ABNORMAL LOW (ref 13.0–17.7)
Lymphocytes Absolute: 1.7 10*3/uL (ref 0.7–3.1)
Lymphs: 35 %
MCH: 30.2 pg (ref 26.6–33.0)
MCHC: 34.1 g/dL (ref 31.5–35.7)
MCV: 89 fL (ref 79–97)
Monocytes Absolute: 1.3 10*3/uL — ABNORMAL HIGH (ref 0.1–0.9)
Monocytes: 26 %
NRBC: 1 % — ABNORMAL HIGH (ref 0–0)
Neutrophils Absolute: 1.7 10*3/uL (ref 1.4–7.0)
Neutrophils: 34 %
RBC: 2.91 x10E6/uL — ABNORMAL LOW (ref 4.14–5.80)
RDW: 15.9 % — ABNORMAL HIGH (ref 11.6–15.4)
WBC: 4.9 10*3/uL (ref 3.4–10.8)

## 2021-12-04 LAB — BASIC METABOLIC PANEL
BUN/Creatinine Ratio: 28 — ABNORMAL HIGH (ref 10–24)
BUN: 35 mg/dL — ABNORMAL HIGH (ref 8–27)
CO2: 20 mmol/L (ref 20–29)
Calcium: 8.9 mg/dL (ref 8.6–10.2)
Chloride: 106 mmol/L (ref 96–106)
Creatinine, Ser: 1.23 mg/dL (ref 0.76–1.27)
Glucose: 104 mg/dL — ABNORMAL HIGH (ref 70–99)
Potassium: 4.1 mmol/L (ref 3.5–5.2)
Sodium: 138 mmol/L (ref 134–144)
eGFR: 57 mL/min/{1.73_m2} — ABNORMAL LOW (ref >59)

## 2021-12-04 NOTE — Progress Notes (Signed)
Telephone message received from [place] at [facility].  Patient has been having hematuria.  CBC was drawn on 12/03/21 to see if patient was in need of a blood transfusion.  Hemoglobin is 8.8.  Patient does not need blood transfusion.  ?

## 2021-12-07 ENCOUNTER — Inpatient Hospital Stay (HOSPITAL_COMMUNITY): Payer: Medicare HMO | Attending: Hematology

## 2021-12-07 ENCOUNTER — Ambulatory Visit (HOSPITAL_COMMUNITY): Admission: RE | Admit: 2021-12-07 | Payer: Medicare HMO | Source: Ambulatory Visit

## 2021-12-07 ENCOUNTER — Inpatient Hospital Stay (HOSPITAL_COMMUNITY): Payer: Medicare HMO

## 2021-12-07 DIAGNOSIS — C9310 Chronic myelomonocytic leukemia not having achieved remission: Secondary | ICD-10-CM | POA: Insufficient documentation

## 2021-12-07 DIAGNOSIS — D649 Anemia, unspecified: Secondary | ICD-10-CM

## 2021-12-07 DIAGNOSIS — Z5111 Encounter for antineoplastic chemotherapy: Secondary | ICD-10-CM | POA: Insufficient documentation

## 2021-12-07 DIAGNOSIS — M129 Arthropathy, unspecified: Secondary | ICD-10-CM | POA: Insufficient documentation

## 2021-12-07 DIAGNOSIS — D539 Nutritional anemia, unspecified: Secondary | ICD-10-CM | POA: Diagnosis not present

## 2021-12-07 DIAGNOSIS — Z95828 Presence of other vascular implants and grafts: Secondary | ICD-10-CM

## 2021-12-07 DIAGNOSIS — D696 Thrombocytopenia, unspecified: Secondary | ICD-10-CM | POA: Diagnosis not present

## 2021-12-07 DIAGNOSIS — D469 Myelodysplastic syndrome, unspecified: Secondary | ICD-10-CM

## 2021-12-07 DIAGNOSIS — Z87891 Personal history of nicotine dependence: Secondary | ICD-10-CM | POA: Diagnosis not present

## 2021-12-07 DIAGNOSIS — I10 Essential (primary) hypertension: Secondary | ICD-10-CM | POA: Diagnosis not present

## 2021-12-07 LAB — CBC
HCT: 23.3 % — ABNORMAL LOW (ref 39.0–52.0)
Hemoglobin: 7.8 g/dL — ABNORMAL LOW (ref 13.0–17.0)
MCH: 31.6 pg (ref 26.0–34.0)
MCHC: 33.5 g/dL (ref 30.0–36.0)
MCV: 94.3 fL (ref 80.0–100.0)
Platelets: 33 10*3/uL — ABNORMAL LOW (ref 150–400)
RBC: 2.47 MIL/uL — ABNORMAL LOW (ref 4.22–5.81)
RDW: 17.7 % — ABNORMAL HIGH (ref 11.5–15.5)
WBC: 6.5 10*3/uL (ref 4.0–10.5)
nRBC: 0.5 % — ABNORMAL HIGH (ref 0.0–0.2)

## 2021-12-07 LAB — SAMPLE TO BLOOD BANK

## 2021-12-07 MED ORDER — HEPARIN SOD (PORK) LOCK FLUSH 100 UNIT/ML IV SOLN
500.0000 [IU] | Freq: Once | INTRAVENOUS | Status: AC
  Administered 2021-12-07: 500 [IU] via INTRAVENOUS

## 2021-12-07 MED ORDER — SODIUM CHLORIDE 0.9% FLUSH
10.0000 mL | INTRAVENOUS | Status: DC | PRN
  Administered 2021-12-07: 10 mL via INTRAVENOUS

## 2021-12-07 NOTE — Progress Notes (Signed)
Hemoglobin 7.8 today. Pt denies SOB, dizziness, light headedness, feels good today per Pt. MD aware. Will follow up as scheduled. No blood today.  ? ?Vitals stable and discharged home from clinic ambulatory. Follow up as scheduled. ? ?

## 2021-12-07 NOTE — Progress Notes (Signed)
Patients port flushed without difficulty.  Good blood return noted with no bruising or swelling noted at site.  Stable during access and blood draw.  Patient to remain accessed for possible transfusion. 

## 2021-12-07 NOTE — Progress Notes (Signed)
? ?History of Present Illness: Here for follow-up of hematuria/retention. ? ?He developed gross hematuria following placement of a Foley catheter about a week ago.  Apparently the balloon was inflated within his prostate.  He had immediate pain and gross hematuria.  He came here and catheter was replaced, bladder was irrigated, and his urine has been clear since. ? ?He feels that his retention was secondary to constipation.  His bowels are acting normally now.  He was not placed on any alpha blockers. ? ?Past Medical History:  ?Diagnosis Date  ? 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 Hypertension   ? Port-A-Cath in place 10/15/2021  ? ? ?Past Surgical History:  ?Procedure Laterality Date  ? CARPAL TUNNEL RELEASE Right   ? COLONOSCOPY N/A 09/19/2013  ? Procedure: COLONOSCOPY;  Surgeon: Rogene Houston, MD;  [facility];  Service: Endoscopy;  Laterality: N/A;  830  ? FINGER AMPUTATION Left   ? 4th and 5th  ? left eye blindness Left   ? PORTACATH PLACEMENT Right 10/16/2021  ? Procedure: INSERTION PORT-A-CATH;  Surgeon: Aviva Signs, MD;  Location: [facility];  Service: General;  Laterality: Right;  ? SVT ABLATION N/A 09/21/2018  ? Procedure: SVT ABLATION;  Surgeon: Constance Haw, MD;  Location: [facility];  Service: Cardiovascular;  Laterality: N/A;  ? ? ?Home Medications:  ?Allergies as of 12/08/2021   ? ?   Reactions  ? Aspirin Other (See Comments)  ? Gi upset, can tolerate baby aspirin  ? Flomax [tamsulosin] Nausea Only  ? ?  ? ?  ?Medication List  ?  ? ?  ? Accurate as of Dec 07, 2021  8:17 PM. If you have any questions, ask your nurse or doctor.  ?  ?  ? ?  ? ?albuterol 108 (90 Base) MCG/ACT inhaler ?Commonly known as: VENTOLIN HFA ?Inhale 2 puffs into the lungs every 6 (six) hours as needed for wheezing or shortness of breath. ?  ?azaCITIDine 5 mg/2 mLs in lactated ringers infusion ?Inject 75 mg/m2 into the vein daily. Days 1-5 every 28 days ?  ?cholecalciferol 1000 units  tablet ?Commonly known as: VITAMIN D ?Take 1,000 Units by mouth daily. ?  ?cloNIDine 0.1 MG tablet ?Commonly known as: CATAPRES ?Take 0.1 mg by mouth daily. ?  ?docusate sodium 100 MG capsule ?Commonly known as: Colace ?Take 1 capsule (100 mg total) by mouth 2 (two) times daily. ?  ?hydrochlorothiazide 25 MG tablet ?Commonly known as: HYDRODIURIL ?Take 25 mg by mouth daily. ?  ?levocetirizine 5 MG tablet ?Commonly known as: XYZAL ?Take 5 mg by mouth daily. ?  ?lidocaine-prilocaine cream ?Commonly known as: EMLA ?Apply a dime-sized amount to port a cath site (do not rub in) and cover with plastic wrap one hour prior to infusion appointments ?  ?lisinopril 40 MG tablet ?Commonly known as: ZESTRIL ?Take 40 mg by mouth daily. ?  ?pantoprazole 40 MG tablet ?Commonly known as: PROTONIX ?Take 1 tablet (40 mg total) by mouth daily. ?  ?prochlorperazine 10 MG tablet ?Commonly known as: COMPAZINE ?TAKE (1) TABLET BY MOUTH EVERY SIX HOURS AS NEEDED. ?  ?vitamin B-12 1000 MCG tablet ?Commonly known as: CYANOCOBALAMIN ?Take 1,000 mcg by mouth daily. ?  ? ?  ? ? ?Allergies:  ?Allergies  ?Allergen Reactions  ? Aspirin Other (See Comments)  ?  Gi upset, can tolerate baby aspirin  ? Flomax [Tamsulosin] Nausea Only  ? ? ?  Family History  ?Problem Relation Age of Onset  ? CAD Mother   ? CAD Father   ? Cancer Brother   ? ? ?Social History:  reports that he quit smoking about 42 years ago. His smoking use included cigarettes. He started smoking about 65 years ago. He smoked an average of .5 packs per day. He has never used smokeless tobacco. He reports that he does not drink alcohol and does not use drugs. ? ?ROS: ?A complete review of systems was performed.  All systems are negative except for pertinent findings as noted. ? ?Physical Exam:  ?Vital signs in last 24 hours: ?There were no vitals taken for this visit. ?Constitutional:  Alert and oriented, No acute distress ?Cardiovascular: Regular rate  ?Respiratory: Normal respiratory  effort ?GI: Abdomen is soft, nontender, nondistended, no abdominal masses. No CVAT.  ?Genitourinary: Normal male phallus, testes are descended bilaterally and non-tender and without masses, scrotum is normal in appearance without lesions or masses, perineum is normal on inspection. ?Lymphatic: No lymphadenopathy ?Neurologic: Grossly intact, no focal deficits ?Psychiatric: Normal mood and affect ? ?I have reviewed prior pt notes ? ?I have reviewed notes from r emergency room ? ?I have reviewed urinalysis results ? ?I have independently reviewed prior imaging ? ?I have reviewed prior PSA results ? ? ? ?Impression/Assessment:  ?History of retention recently, most likely due to constipation.  Subsequent traumatic catheter placement. ? ?He passed a voiding trial today ? ?Plan:  ?1.  I started him on alfuzosin ? ?2.  Office visit in a couple of weeks to check on voiding. ? ? ?

## 2021-12-08 ENCOUNTER — Encounter: Payer: Self-pay | Admitting: Urology

## 2021-12-08 ENCOUNTER — Ambulatory Visit: Payer: Medicare HMO | Admitting: Urology

## 2021-12-08 VITALS — BP 107/55 | HR 84

## 2021-12-08 DIAGNOSIS — N401 Enlarged prostate with lower urinary tract symptoms: Secondary | ICD-10-CM

## 2021-12-08 DIAGNOSIS — R972 Elevated prostate specific antigen [PSA]: Secondary | ICD-10-CM | POA: Diagnosis not present

## 2021-12-08 DIAGNOSIS — R339 Retention of urine, unspecified: Secondary | ICD-10-CM

## 2021-12-08 DIAGNOSIS — N138 Other obstructive and reflux uropathy: Secondary | ICD-10-CM | POA: Diagnosis not present

## 2021-12-08 LAB — URINE CULTURE

## 2021-12-08 MED ORDER — CIPROFLOXACIN HCL 500 MG PO TABS: 500.0000 mg | ORAL_TABLET | Freq: Once | ORAL | Status: DC

## 2021-12-08 MED ORDER — ALFUZOSIN HCL ER 10 MG PO TB24: 10.0000 mg | ORAL_TABLET | Freq: Every day | ORAL | 11 refills | Status: DC

## 2021-12-08 NOTE — Progress Notes (Signed)
Fill and Pull Catheter Removal ? ?Patient is present today for a catheter removal.  Patient was cleaned and prepped in a sterile fashion 321m of sterile water/ saline was instilled into the bladder when the patient felt the urge to urinate. 171mof water was then drained from the balloon.  A 24 coudeFR foley cath was removed from the bladder no complications were noted .  Patient as then given some time to void on their own.  Patient can void  [id number] their own after some time.  Patient tolerated well. ? ?Performed by: AmaEstill Bamberg ? ?Follow up/ Additional notes: MD to see  ?

## 2021-12-08 NOTE — Addendum Note (Signed)
Addended by: Iris Pert on: 12/08/2021 12:15 PM ? ? Modules accepted: Orders ? ?

## 2021-12-14 ENCOUNTER — Inpatient Hospital Stay (HOSPITAL_COMMUNITY): Payer: Medicare HMO

## 2021-12-14 ENCOUNTER — Inpatient Hospital Stay (HOSPITAL_COMMUNITY): Payer: Medicare HMO | Admitting: Dietician

## 2021-12-14 VITALS — BP 108/63 | HR 61 | Temp 97.5°F | Resp 18

## 2021-12-14 DIAGNOSIS — C9310 Chronic myelomonocytic leukemia not having achieved remission: Secondary | ICD-10-CM

## 2021-12-14 DIAGNOSIS — Z95828 Presence of other vascular implants and grafts: Secondary | ICD-10-CM

## 2021-12-14 DIAGNOSIS — D469 Myelodysplastic syndrome, unspecified: Secondary | ICD-10-CM

## 2021-12-14 DIAGNOSIS — Z5111 Encounter for antineoplastic chemotherapy: Secondary | ICD-10-CM | POA: Diagnosis not present

## 2021-12-14 LAB — COMPREHENSIVE METABOLIC PANEL
ALT: 19 U/L (ref 0–44)
AST: 17 U/L (ref 15–41)
Albumin: 2.8 g/dL — ABNORMAL LOW (ref 3.5–5.0)
Alkaline Phosphatase: 50 U/L (ref 38–126)
Anion gap: 9 (ref 5–15)
BUN: 49 mg/dL — ABNORMAL HIGH (ref 8–23)
CO2: 21 mmol/L — ABNORMAL LOW (ref 22–32)
Calcium: 8.8 mg/dL — ABNORMAL LOW (ref 8.9–10.3)
Chloride: 109 mmol/L (ref 98–111)
Creatinine, Ser: 1.74 mg/dL — ABNORMAL HIGH (ref 0.61–1.24)
GFR, Estimated: 37 mL/min — ABNORMAL LOW (ref >60)
Glucose, Bld: 124 mg/dL — ABNORMAL HIGH (ref 70–99)
Potassium: 3.9 mmol/L (ref 3.5–5.1)
Sodium: 139 mmol/L (ref 135–145)
Total Bilirubin: 0.4 mg/dL (ref 0.3–1.2)
Total Protein: 6.6 g/dL (ref 6.5–8.1)

## 2021-12-14 LAB — CBC WITH DIFFERENTIAL/PLATELET
Abs Immature Granulocytes: 0.42 10*3/uL — ABNORMAL HIGH (ref 0.00–0.07)
Basophils Absolute: 0 10*3/uL (ref 0.0–0.1)
Basophils Relative: 0 %
Eosinophils Absolute: 0 10*3/uL (ref 0.0–0.5)
Eosinophils Relative: 0 %
HCT: 21 % — ABNORMAL LOW (ref 39.0–52.0)
Hemoglobin: 7 g/dL — ABNORMAL LOW (ref 13.0–17.0)
Immature Granulocytes: 7 %
Lymphocytes Relative: 40 %
Lymphs Abs: 2.5 10*3/uL (ref 0.7–4.0)
MCH: 31.3 pg (ref 26.0–34.0)
MCHC: 33.3 g/dL (ref 30.0–36.0)
MCV: 93.8 fL (ref 80.0–100.0)
Monocytes Absolute: 1.3 10*3/uL — ABNORMAL HIGH (ref 0.1–1.0)
Monocytes Relative: 20 %
Neutro Abs: 2 10*3/uL (ref 1.7–7.7)
Neutrophils Relative %: 33 %
Platelets: 40 10*3/uL — ABNORMAL LOW (ref 150–400)
RBC: 2.24 MIL/uL — ABNORMAL LOW (ref 4.22–5.81)
RDW: 17.4 % — ABNORMAL HIGH (ref 11.5–15.5)
WBC: 6.1 10*3/uL (ref 4.0–10.5)
nRBC: 0.5 % — ABNORMAL HIGH (ref 0.0–0.2)

## 2021-12-14 LAB — SAMPLE TO BLOOD BANK

## 2021-12-14 LAB — PREPARE RBC (CROSSMATCH)

## 2021-12-14 LAB — MAGNESIUM: Magnesium: 2.3 mg/dL (ref 1.7–2.4)

## 2021-12-14 MED ORDER — HEPARIN SOD (PORK) LOCK FLUSH 100 UNIT/ML IV SOLN
500.0000 [IU] | Freq: Once | INTRAVENOUS | Status: AC | PRN
  Administered 2021-12-14: 500 [IU]

## 2021-12-14 MED ORDER — DIPHENHYDRAMINE HCL 25 MG PO CAPS
25.0000 mg | ORAL_CAPSULE | Freq: Once | ORAL | Status: AC
  Administered 2021-12-14: 25 mg via ORAL
  Filled 2021-12-14: qty 1

## 2021-12-14 MED ORDER — SODIUM CHLORIDE 0.9 % IV SOLN: Freq: Once | INTRAVENOUS | Status: AC

## 2021-12-14 MED ORDER — SODIUM CHLORIDE 0.9 % IV SOLN
55.0000 mg/m2 | Freq: Once | INTRAVENOUS | Status: AC
  Administered 2021-12-14: 100 mg via INTRAVENOUS
  Filled 2021-12-14: qty 10

## 2021-12-14 MED ORDER — SODIUM CHLORIDE 0.9 % IV SOLN
10.0000 mg | Freq: Once | INTRAVENOUS | Status: AC
  Administered 2021-12-14: 10 mg via INTRAVENOUS
  Filled 2021-12-14: qty 10

## 2021-12-14 MED ORDER — SODIUM CHLORIDE 0.9% IV SOLUTION
250.0000 mL | Freq: Once | INTRAVENOUS | Status: AC
  Administered 2021-12-14: 250 mL via INTRAVENOUS

## 2021-12-14 MED ORDER — ACETAMINOPHEN 325 MG PO TABS
650.0000 mg | ORAL_TABLET | Freq: Once | ORAL | Status: AC
  Administered 2021-12-14: 650 mg via ORAL
  Filled 2021-12-14: qty 2

## 2021-12-14 MED ORDER — SODIUM CHLORIDE 0.9% FLUSH
10.0000 mL | INTRAVENOUS | Status: DC | PRN
  Administered 2021-12-14: 10 mL

## 2021-12-14 MED ORDER — PALONOSETRON HCL INJECTION 0.25 MG/5ML
0.2500 mg | Freq: Once | INTRAVENOUS | Status: AC
  Administered 2021-12-14: 0.25 mg via INTRAVENOUS
  Filled 2021-12-14: qty 5

## 2021-12-14 NOTE — Progress Notes (Signed)
Nutrition Follow-up: ? ?Patient with chronic myelomonocytic leukemia. His currently receiving azacitidine q28d.  ? ?Met with patient during infusion. He reports his taste has improved. Patient is very glad about this. He reports appetite has improved. He is eating 3 meals plus drinking 2-3 Ensure Complete. Patient reports his daughter calls him daily to make sure he is drinking these. Patient reports eating applesauce this morning prior to appointment. He politely declined to drink an Ensure during infusion. Patient reports he will be sure to drink one when he gets home. Patient denies nausea, vomiting, diarrhea, constipation.  ? ? ?Medications: reviewed  ? ?Labs: glucose 124, BUN 49, Cr 1.74, Hgb 7 ? ?Anthropometrics: No new weight recorded. Patient 152 lb on 4/27 ? ? ?NUTRITION DIAGNOSIS: Moderate malnutrition ongoing ? ? ?INTERVENTION:  ?Encouraged high calorie high protein foods to promote weight gain ?Reviewed foods with protein and instructed to include protein source with all meals and snacks ?Continue drinking 2 Ensure Complete - pt reports he does not need coupons  ?  ? ?MONITORING, EVALUATION, GOAL: weight trends, intake ? ? ?NEXT VISIT: Monday June 5 during infusion  ? ? ? ?

## 2021-12-14 NOTE — Progress Notes (Signed)
Ok to treat today with labs and vitals - patient receiving IV fluids and blood. ? ?T.O. Tarri Abernethy, PA-C/Leor Whyte Ronnald Ramp, PharmD ?

## 2021-12-14 NOTE — Patient Instructions (Signed)
[facility]  Discharge Instructions: ?Thank you for choosing [facility] to provide your oncology and hematology care.  ?If you have a lab appointment with the [facility], please come in thru the Main Entrance and check in at the main information desk. ? ?Wear comfortable clothing and clothing appropriate for easy access to any Portacath or PICC line.  ? ?We strive to give you quality time with your provider. You may need to reschedule your appointment if you arrive late (15 or more minutes).  Arriving late affects you and other patients whose appointments are after yours.  Also, if you miss three or more appointments without notifying the office, you may be dismissed from the clinic at the provider?s discretion.    ?  ?For prescription refill requests, have your pharmacy contact our office and allow 72 hours for refills to be completed.   ? ?Today you received the following chemotherapy and/or immunotherapy agents Vidaza, 1 liter of NS, and 1 unit of blood. ? ? ?BELOW ARE SYMPTOMS THAT SHOULD BE REPORTED IMMEDIATELY: ?*FEVER GREATER THAN 100.4 F (38 ?C) OR HIGHER ?*CHILLS OR SWEATING ?*NAUSEA AND VOMITING THAT IS NOT CONTROLLED WITH YOUR NAUSEA MEDICATION ?*UNUSUAL SHORTNESS OF BREATH ?*UNUSUAL BRUISING OR BLEEDING ?*URINARY PROBLEMS (pain or burning when urinating, or frequent urination) ?*BOWEL PROBLEMS (unusual diarrhea, constipation, pain near the anus) ?TENDERNESS IN MOUTH AND THROAT WITH OR WITHOUT PRESENCE OF ULCERS (sore throat, sores in mouth, or a toothache) ?UNUSUAL RASH, SWELLING OR PAIN  ?UNUSUAL VAGINAL DISCHARGE OR ITCHING  ? ?Items with * indicate a potential emergency and should be followed up as soon as possible or go to the Emergency Department if any problems should occur. ? ?Please show the CHEMOTHERAPY ALERT CARD or IMMUNOTHERAPY ALERT CARD at check-in to the Emergency Department and triage nurse. ? ?Should you have questions after your visit or need to cancel or  reschedule your appointment, please contact [facility] [phone]  and follow the prompts.  Office hours are 8:00 a.m. to 4:30 p.m. Monday - Friday. Please note that voicemails left after 4:00 p.m. may not be returned until the following business day.  We are closed weekends and major holidays. You have access to a nurse at all times for urgent questions. Please call the main number to the clinic [phone] and follow the prompts. ? ?For any non-urgent questions, you may also contact your provider using MyChart. We now offer e-Visits for anyone 58 and older to request care online for non-urgent symptoms. For details visit [email or web address]. ?  ?Also download the MyChart app! Go to the app store, search "MyChart", open the app, select [facility], and log in with your MyChart username and password. ? ?Due to Covid, a mask is required upon entering the hospital/clinic. If you do not have a mask, one will be given to you upon arrival. For doctor visits, patients may have 1 support person aged 48 or older with them. For treatment visits, patients cannot have anyone with them due to current Covid guidelines and our immunocompromised population.  ?

## 2021-12-14 NOTE — Progress Notes (Signed)
Pt presents today for D1 Vidaza and possible blood today per provider's order. Vital signs and other labs WNL for treatment day. Pt's creatinine was 1.74 and hemoglobin was 7 today. Pt c/o shortness of breath and weakness in legs. Pt's Dr.K's standing order pt will receive 1 unit of blood today. ? ?Message sent to Dr. Benay Spice about pt's creatinine. R.Pennington-PA included in the message and stated to give pt 1 liter of NS over two hours. Okay to proceed with treatment today. Dr.Sherrill later messaged back stating for pt to stop hydrochlorothiazide and lisinopril and follow-up with Dr.K. Pt will also receive BMP re-check on Thursday. ? ?D1 Vidaza, 1 liter of NS and 1 unit of blood given today per MD orders. Tolerated infusion without adverse affects. Vital signs stable. No complaints at this time. Discharged from clinic ambulatory in stable condition. Alert and oriented x 3. F/U with [facility] as scheduled.   ? ?

## 2021-12-15 ENCOUNTER — Inpatient Hospital Stay (HOSPITAL_COMMUNITY): Payer: Medicare HMO

## 2021-12-15 VITALS — BP 134/65 | HR 76 | Temp 97.0°F | Resp 18

## 2021-12-15 DIAGNOSIS — Z5111 Encounter for antineoplastic chemotherapy: Secondary | ICD-10-CM | POA: Diagnosis not present

## 2021-12-15 DIAGNOSIS — Z95828 Presence of other vascular implants and grafts: Secondary | ICD-10-CM

## 2021-12-15 DIAGNOSIS — C9310 Chronic myelomonocytic leukemia not having achieved remission: Secondary | ICD-10-CM

## 2021-12-15 LAB — TYPE AND SCREEN
ABO/RH(D): B POS
Antibody Screen: NEGATIVE
Unit division: 0

## 2021-12-15 LAB — BPAM RBC
Blood Product Expiration Date: 202306072359
ISSUE DATE / TIME: 202305081232
Unit Type and Rh: 1700

## 2021-12-15 MED ORDER — SODIUM CHLORIDE 0.9 % IV SOLN
10.0000 mg | Freq: Once | INTRAVENOUS | Status: AC
  Administered 2021-12-15: 10 mg via INTRAVENOUS
  Filled 2021-12-15: qty 10

## 2021-12-15 MED ORDER — SODIUM CHLORIDE 0.9% FLUSH
10.0000 mL | INTRAVENOUS | Status: DC | PRN
  Administered 2021-12-15: 10 mL

## 2021-12-15 MED ORDER — SODIUM CHLORIDE 0.9 % IV SOLN: Freq: Once | INTRAVENOUS | Status: AC

## 2021-12-15 MED ORDER — SODIUM CHLORIDE 0.9 % IV SOLN
53.0000 mg/m2 | Freq: Once | INTRAVENOUS | Status: AC
  Administered 2021-12-15: 100 mg via INTRAVENOUS
  Filled 2021-12-15: qty 10

## 2021-12-15 MED ORDER — HEPARIN SOD (PORK) LOCK FLUSH 100 UNIT/ML IV SOLN
500.0000 [IU] | Freq: Once | INTRAVENOUS | Status: AC | PRN
  Administered 2021-12-15: 500 [IU]

## 2021-12-15 NOTE — Progress Notes (Signed)
Patient tolerated chemotherapy with no complaints voiced. Side effects with management reviewed understanding verbalized. Port site clean and dry with no bruising or swelling noted at site. Good blood return noted before and after administration of chemotherapy.  Patient left in satisfactory condition with VSS and no s/s of distress noted. 

## 2021-12-15 NOTE — Patient Instructions (Signed)
[facility]  Discharge Instructions: ?Thank you for choosing [facility] to provide your oncology and hematology care.  ?If you have a lab appointment with the [facility], please come in thru the Main Entrance and check in at the main information desk. ? ?Wear comfortable clothing and clothing appropriate for easy access to any Portacath or PICC line.  ? ?We strive to give you quality time with your provider. You may need to reschedule your appointment if you arrive late (15 or more minutes).  Arriving late affects you and other patients whose appointments are after yours.  Also, if you miss three or more appointments without notifying the office, you may be dismissed from the clinic at the provider?s discretion.    ?  ?For prescription refill requests, have your pharmacy contact our office and allow 72 hours for refills to be completed.   ? ?Today you received the following chemotherapy and/or immunotherapy agents Vidaza, return as scheduled. ?  ?To help prevent nausea and vomiting after your treatment, we encourage you to take your nausea medication as directed. ? ?BELOW ARE SYMPTOMS THAT SHOULD BE REPORTED IMMEDIATELY: ?*FEVER GREATER THAN 100.4 F (38 ?C) OR HIGHER ?*CHILLS OR SWEATING ?*NAUSEA AND VOMITING THAT IS NOT CONTROLLED WITH YOUR NAUSEA MEDICATION ?*UNUSUAL SHORTNESS OF BREATH ?*UNUSUAL BRUISING OR BLEEDING ?*URINARY PROBLEMS (pain or burning when urinating, or frequent urination) ?*BOWEL PROBLEMS (unusual diarrhea, constipation, pain near the anus) ?TENDERNESS IN MOUTH AND THROAT WITH OR WITHOUT PRESENCE OF ULCERS (sore throat, sores in mouth, or a toothache) ?UNUSUAL RASH, SWELLING OR PAIN  ?UNUSUAL VAGINAL DISCHARGE OR ITCHING  ? ?Items with * indicate a potential emergency and should be followed up as soon as possible or go to the Emergency Department if any problems should occur. ? ?Please show the CHEMOTHERAPY ALERT CARD or IMMUNOTHERAPY ALERT CARD at check-in to the  Emergency Department and triage nurse. ? ?Should you have questions after your visit or need to cancel or reschedule your appointment, please contact [facility] [phone]  and follow the prompts.  Office hours are 8:00 a.m. to 4:30 p.m. Monday - Friday. Please note that voicemails left after 4:00 p.m. may not be returned until the following business day.  We are closed weekends and major holidays. You have access to a nurse at all times for urgent questions. Please call the main number to the clinic [phone] and follow the prompts. ? ?For any non-urgent questions, you may also contact your provider using MyChart. We now offer e-Visits for anyone 34 and older to request care online for non-urgent symptoms. For details visit [email or web address]. ?  ?Also download the MyChart app! Go to the app store, search "MyChart", open the app, select [facility], and log in with your MyChart username and password. ? ?Due to Covid, a mask is required upon entering the hospital/clinic. If you do not have a mask, one will be given to you upon arrival. For doctor visits, patients may have 1 support person aged 58 or older with them. For treatment visits, patients cannot have anyone with them due to current Covid guidelines and our immunocompromised population.  ?

## 2021-12-16 ENCOUNTER — Inpatient Hospital Stay (HOSPITAL_COMMUNITY): Payer: Medicare HMO

## 2021-12-16 VITALS — BP 124/53 | HR 94 | Temp 97.7°F | Resp 18

## 2021-12-16 DIAGNOSIS — Z5111 Encounter for antineoplastic chemotherapy: Secondary | ICD-10-CM | POA: Diagnosis not present

## 2021-12-16 DIAGNOSIS — C9310 Chronic myelomonocytic leukemia not having achieved remission: Secondary | ICD-10-CM

## 2021-12-16 DIAGNOSIS — Z95828 Presence of other vascular implants and grafts: Secondary | ICD-10-CM

## 2021-12-16 MED ORDER — SODIUM CHLORIDE 0.9 % IV SOLN: Freq: Once | INTRAVENOUS | Status: AC

## 2021-12-16 MED ORDER — SODIUM CHLORIDE 0.9 % IV SOLN
10.0000 mg | Freq: Once | INTRAVENOUS | Status: AC
  Administered 2021-12-16: 10 mg via INTRAVENOUS
  Filled 2021-12-16: qty 10

## 2021-12-16 MED ORDER — PALONOSETRON HCL INJECTION 0.25 MG/5ML
0.2500 mg | Freq: Once | INTRAVENOUS | Status: AC
  Administered 2021-12-16: 0.25 mg via INTRAVENOUS
  Filled 2021-12-16: qty 5

## 2021-12-16 MED ORDER — SODIUM CHLORIDE 0.9% FLUSH
10.0000 mL | INTRAVENOUS | Status: DC | PRN
  Administered 2021-12-16: 10 mL

## 2021-12-16 MED ORDER — SODIUM CHLORIDE 0.9 % IV SOLN
55.0000 mg/m2 | Freq: Once | INTRAVENOUS | Status: AC
  Administered 2021-12-16: 100 mg via INTRAVENOUS
  Filled 2021-12-16: qty 10

## 2021-12-16 MED ORDER — HEPARIN SOD (PORK) LOCK FLUSH 100 UNIT/ML IV SOLN
500.0000 [IU] | Freq: Once | INTRAVENOUS | Status: AC | PRN
  Administered 2021-12-16: 500 [IU]

## 2021-12-16 NOTE — Progress Notes (Signed)
Patient presents today for Vidaza infusion per providers order.  Vital signs within parameters for treatment.  Patient has no new complaints at this time.   ? ?Vidaza infusion given today per MD orders.  Stable during infusion without adverse affects.  Vital signs stable.  No complaints at this time.  Discharge from clinic ambulatory in stable condition.  Alert and oriented X 3.  Follow up with [facility] as scheduled.  ?

## 2021-12-16 NOTE — Patient Instructions (Signed)
[facility]  Discharge Instructions: Thank you for choosing [facility] to provide your oncology and hematology care.  If you have a lab appointment with the Cancer Center, please come in thru the Main Entrance and check in at the main information desk.  Wear comfortable clothing and clothing appropriate for easy access to any Portacath or PICC line.   We strive to give you quality time with your provider. You may need to reschedule your appointment if you arrive late (15 or more minutes).  Arriving late affects you and other patients whose appointments are after yours.  Also, if you miss three or more appointments without notifying the office, you may be dismissed from the clinic at the provider's discretion.      For prescription refill requests, have your pharmacy contact our office and allow 72 hours for refills to be completed.    Today you received the following chemotherapy and/or immunotherapy agents Vidaza      To help prevent nausea and vomiting after your treatment, we encourage you to take your nausea medication as directed.  BELOW ARE SYMPTOMS THAT SHOULD BE REPORTED IMMEDIATELY: *FEVER GREATER THAN 100.4 F (38 C) OR HIGHER *CHILLS OR SWEATING *NAUSEA AND VOMITING THAT IS NOT CONTROLLED WITH YOUR NAUSEA MEDICATION *UNUSUAL SHORTNESS OF BREATH *UNUSUAL BRUISING OR BLEEDING *URINARY PROBLEMS (pain or burning when urinating, or frequent urination) *BOWEL PROBLEMS (unusual diarrhea, constipation, pain near the anus) TENDERNESS IN MOUTH AND THROAT WITH OR WITHOUT PRESENCE OF ULCERS (sore throat, sores in mouth, or a toothache) UNUSUAL RASH, SWELLING OR PAIN  UNUSUAL VAGINAL DISCHARGE OR ITCHING   Items with * indicate a potential emergency and should be followed up as soon as possible or go to the Emergency Department if any problems should occur.  Please show the CHEMOTHERAPY ALERT CARD or IMMUNOTHERAPY ALERT CARD at check-in to the Emergency  Department and triage nurse.  Should you have questions after your visit or need to cancel or reschedule your appointment, please contact  CANCER CENTER [phone]  and follow the prompts.  Office hours are 8:00 a.m. to 4:30 p.m. Monday - Friday. Please note that voicemails left after 4:00 p.m. may not be returned until the following business day.  We are closed weekends and major holidays. You have access to a nurse at all times for urgent questions. Please call the main number to the clinic [phone] and follow the prompts.  For any non-urgent questions, you may also contact your provider using MyChart. We now offer e-Visits for anyone 18 and older to request care online for non-urgent symptoms. For details visit [email or web address].   Also download the MyChart app! Go to the app store, search "MyChart", open the app, select [facility], and log in with your MyChart username and password.  Due to Covid, a mask is required upon entering the hospital/clinic. If you do not have a mask, one will be given to you upon arrival. For doctor visits, patients may have 1 support person aged 18 or older with them. For treatment visits, patients cannot have anyone with them due to current Covid guidelines and our immunocompromised population.  

## 2021-12-17 ENCOUNTER — Inpatient Hospital Stay (HOSPITAL_COMMUNITY): Payer: Medicare HMO

## 2021-12-17 VITALS — BP 143/60 | HR 75 | Temp 97.8°F | Resp 18

## 2021-12-17 DIAGNOSIS — C9310 Chronic myelomonocytic leukemia not having achieved remission: Secondary | ICD-10-CM

## 2021-12-17 DIAGNOSIS — Z5111 Encounter for antineoplastic chemotherapy: Secondary | ICD-10-CM | POA: Diagnosis not present

## 2021-12-17 DIAGNOSIS — Z95828 Presence of other vascular implants and grafts: Secondary | ICD-10-CM

## 2021-12-17 LAB — COMPREHENSIVE METABOLIC PANEL
ALT: 17 U/L (ref 0–44)
AST: 15 U/L (ref 15–41)
Albumin: 2.9 g/dL — ABNORMAL LOW (ref 3.5–5.0)
Alkaline Phosphatase: 52 U/L (ref 38–126)
Anion gap: 6 (ref 5–15)
BUN: 32 mg/dL — ABNORMAL HIGH (ref 8–23)
CO2: 23 mmol/L (ref 22–32)
Calcium: 8.5 mg/dL — ABNORMAL LOW (ref 8.9–10.3)
Chloride: 109 mmol/L (ref 98–111)
Creatinine, Ser: 1.06 mg/dL (ref 0.61–1.24)
GFR, Estimated: >60 mL/min (ref >60)
Glucose, Bld: 116 mg/dL — ABNORMAL HIGH (ref 70–99)
Potassium: 3.9 mmol/L (ref 3.5–5.1)
Sodium: 138 mmol/L (ref 135–145)
Total Bilirubin: 0.4 mg/dL (ref 0.3–1.2)
Total Protein: 6.2 g/dL — ABNORMAL LOW (ref 6.5–8.1)

## 2021-12-17 MED ORDER — HEPARIN SOD (PORK) LOCK FLUSH 100 UNIT/ML IV SOLN
500.0000 [IU] | Freq: Once | INTRAVENOUS | Status: AC | PRN
  Administered 2021-12-17: 500 [IU]

## 2021-12-17 MED ORDER — SODIUM CHLORIDE 0.9 % IV SOLN
10.0000 mg | Freq: Once | INTRAVENOUS | Status: AC
  Administered 2021-12-17: 10 mg via INTRAVENOUS
  Filled 2021-12-17: qty 10

## 2021-12-17 MED ORDER — SODIUM CHLORIDE 0.9 % IV SOLN
55.0000 mg/m2 | Freq: Once | INTRAVENOUS | Status: AC
  Administered 2021-12-17: 100 mg via INTRAVENOUS
  Filled 2021-12-17: qty 10

## 2021-12-17 MED ORDER — SODIUM CHLORIDE 0.9 % IV SOLN: Freq: Once | INTRAVENOUS | Status: AC

## 2021-12-17 MED ORDER — SODIUM CHLORIDE 0.9% FLUSH
10.0000 mL | INTRAVENOUS | Status: DC | PRN
  Administered 2021-12-17: 10 mL

## 2021-12-17 NOTE — Patient Instructions (Signed)
[facility]  Discharge Instructions: Thank you for choosing [facility] to provide your oncology and hematology care.  If you have a lab appointment with the Cancer Center, please come in thru the Main Entrance and check in at the main information desk.  Wear comfortable clothing and clothing appropriate for easy access to any Portacath or PICC line.   We strive to give you quality time with your provider. You may need to reschedule your appointment if you arrive late (15 or more minutes).  Arriving late affects you and other patients whose appointments are after yours.  Also, if you miss three or more appointments without notifying the office, you may be dismissed from the clinic at the provider's discretion.      For prescription refill requests, have your pharmacy contact our office and allow 72 hours for refills to be completed.    Today you received the following chemotherapy and/or immunotherapy agents Vidaza      To help prevent nausea and vomiting after your treatment, we encourage you to take your nausea medication as directed.  BELOW ARE SYMPTOMS THAT SHOULD BE REPORTED IMMEDIATELY: *FEVER GREATER THAN 100.4 F (38 C) OR HIGHER *CHILLS OR SWEATING *NAUSEA AND VOMITING THAT IS NOT CONTROLLED WITH YOUR NAUSEA MEDICATION *UNUSUAL SHORTNESS OF BREATH *UNUSUAL BRUISING OR BLEEDING *URINARY PROBLEMS (pain or burning when urinating, or frequent urination) *BOWEL PROBLEMS (unusual diarrhea, constipation, pain near the anus) TENDERNESS IN MOUTH AND THROAT WITH OR WITHOUT PRESENCE OF ULCERS (sore throat, sores in mouth, or a toothache) UNUSUAL RASH, SWELLING OR PAIN  UNUSUAL VAGINAL DISCHARGE OR ITCHING   Items with * indicate a potential emergency and should be followed up as soon as possible or go to the Emergency Department if any problems should occur.  Please show the CHEMOTHERAPY ALERT CARD or IMMUNOTHERAPY ALERT CARD at check-in to the Emergency  Department and triage nurse.  Should you have questions after your visit or need to cancel or reschedule your appointment, please contact [facility] [phone]  and follow the prompts.  Office hours are 8:00 a.m. to 4:30 p.m. Monday - Friday. Please note that voicemails left after 4:00 p.m. may not be returned until the following business day.  We are closed weekends and major holidays. You have access to a nurse at all times for urgent questions. Please call the main number to the clinic [phone] and follow the prompts.  For any non-urgent questions, you may also contact your provider using MyChart. We now offer e-Visits for anyone 18 and older to request care online for non-urgent symptoms. For details visit [email or web address].   Also download the MyChart app! Go to the app store, search "MyChart", open the app, select [facility], and log in with your MyChart username and password.  Due to Covid, a mask is required upon entering the hospital/clinic. If you do not have a mask, one will be given to you upon arrival. For doctor visits, patients may have 1 support person aged 18 or older with them. For treatment visits, patients cannot have anyone with them due to current Covid guidelines and our immunocompromised population.  

## 2021-12-17 NOTE — Progress Notes (Signed)
Treatment given per orders. Patient tolerated it well without problems. Vitals stable and discharged home from clinic ambulatory. Follow up as scheduled.  

## 2021-12-18 ENCOUNTER — Inpatient Hospital Stay (HOSPITAL_COMMUNITY): Payer: Medicare HMO

## 2021-12-18 VITALS — BP 130/59 | HR 90 | Temp 98.1°F | Resp 18

## 2021-12-18 DIAGNOSIS — C9310 Chronic myelomonocytic leukemia not having achieved remission: Secondary | ICD-10-CM

## 2021-12-18 DIAGNOSIS — Z5111 Encounter for antineoplastic chemotherapy: Secondary | ICD-10-CM | POA: Diagnosis not present

## 2021-12-18 DIAGNOSIS — Z95828 Presence of other vascular implants and grafts: Secondary | ICD-10-CM

## 2021-12-18 MED ORDER — SODIUM CHLORIDE 0.9% FLUSH
10.0000 mL | INTRAVENOUS | Status: DC | PRN
  Administered 2021-12-18: 10 mL

## 2021-12-18 MED ORDER — SODIUM CHLORIDE 0.9 % IV SOLN: Freq: Once | INTRAVENOUS | Status: AC

## 2021-12-18 MED ORDER — HEPARIN SOD (PORK) LOCK FLUSH 100 UNIT/ML IV SOLN
500.0000 [IU] | Freq: Once | INTRAVENOUS | Status: AC | PRN
  Administered 2021-12-18: 500 [IU]

## 2021-12-18 MED ORDER — PALONOSETRON HCL INJECTION 0.25 MG/5ML
0.2500 mg | Freq: Once | INTRAVENOUS | Status: AC
  Administered 2021-12-18: 0.25 mg via INTRAVENOUS
  Filled 2021-12-18: qty 5

## 2021-12-18 MED ORDER — SODIUM CHLORIDE 0.9 % IV SOLN
10.0000 mg | Freq: Once | INTRAVENOUS | Status: AC
  Administered 2021-12-18: 10 mg via INTRAVENOUS
  Filled 2021-12-18: qty 10

## 2021-12-18 MED ORDER — SODIUM CHLORIDE 0.9 % IV SOLN
55.0000 mg/m2 | Freq: Once | INTRAVENOUS | Status: AC
  Administered 2021-12-18: 100 mg via INTRAVENOUS
  Filled 2021-12-18: qty 10

## 2021-12-18 NOTE — Patient Instructions (Signed)
[facility]  Discharge Instructions: Thank you for choosing [facility] to provide your oncology and hematology care.  If you have a lab appointment with the Cancer Center, please come in thru the Main Entrance and check in at the main information desk.  Wear comfortable clothing and clothing appropriate for easy access to any Portacath or PICC line.   We strive to give you quality time with your provider. You may need to reschedule your appointment if you arrive late (15 or more minutes).  Arriving late affects you and other patients whose appointments are after yours.  Also, if you miss three or more appointments without notifying the office, you may be dismissed from the clinic at the provider's discretion.      For prescription refill requests, have your pharmacy contact our office and allow 72 hours for refills to be completed.        To help prevent nausea and vomiting after your treatment, we encourage you to take your nausea medication as directed.  BELOW ARE SYMPTOMS THAT SHOULD BE REPORTED IMMEDIATELY: *FEVER GREATER THAN 100.4 F (38 C) OR HIGHER *CHILLS OR SWEATING *NAUSEA AND VOMITING THAT IS NOT CONTROLLED WITH YOUR NAUSEA MEDICATION *UNUSUAL SHORTNESS OF BREATH *UNUSUAL BRUISING OR BLEEDING *URINARY PROBLEMS (pain or burning when urinating, or frequent urination) *BOWEL PROBLEMS (unusual diarrhea, constipation, pain near the anus) TENDERNESS IN MOUTH AND THROAT WITH OR WITHOUT PRESENCE OF ULCERS (sore throat, sores in mouth, or a toothache) UNUSUAL RASH, SWELLING OR PAIN  UNUSUAL VAGINAL DISCHARGE OR ITCHING   Items with * indicate a potential emergency and should be followed up as soon as possible or go to the Emergency Department if any problems should occur.  Please show the CHEMOTHERAPY ALERT CARD or IMMUNOTHERAPY ALERT CARD at check-in to the Emergency Department and triage nurse.  Should you have questions after your visit or need to cancel  or reschedule your appointment, please contact  CANCER CENTER [phone]  and follow the prompts.  Office hours are 8:00 a.m. to 4:30 p.m. Monday - Friday. Please note that voicemails left after 4:00 p.m. may not be returned until the following business day.  We are closed weekends and major holidays. You have access to a nurse at all times for urgent questions. Please call the main number to the clinic [phone] and follow the prompts.  For any non-urgent questions, you may also contact your provider using MyChart. We now offer e-Visits for anyone 18 and older to request care online for non-urgent symptoms. For details visit [email or web address].   Also download the MyChart app! Go to the app store, search "MyChart", open the app, select [facility], and log in with your MyChart username and password.  Due to Covid, a mask is required upon entering the hospital/clinic. If you do not have a mask, one will be given to you upon arrival. For doctor visits, patients may have 1 support person aged 18 or older with them. For treatment visits, patients cannot have anyone with them due to current Covid guidelines and our immunocompromised population.  

## 2021-12-18 NOTE — Progress Notes (Signed)
Patient presents today for Vidaza infusion.  Patient is in satisfactory condition with no complaints voiced.  Vital signs are stable.  We will proceed with treatment per MD orders.  Patient tolerated treatment well with no complaints voiced.  Patient left ambulatory in stable condition.  Vital signs stable at discharge.  Follow up as scheduled.    

## 2021-12-21 ENCOUNTER — Other Ambulatory Visit: Payer: Self-pay

## 2021-12-21 ENCOUNTER — Inpatient Hospital Stay (HOSPITAL_BASED_OUTPATIENT_CLINIC_OR_DEPARTMENT_OTHER): Payer: Medicare HMO | Admitting: Hematology

## 2021-12-21 ENCOUNTER — Inpatient Hospital Stay (HOSPITAL_COMMUNITY): Payer: Medicare HMO

## 2021-12-21 ENCOUNTER — Encounter (HOSPITAL_COMMUNITY): Payer: Self-pay | Admitting: Hematology

## 2021-12-21 VITALS — BP 126/53 | HR 83 | Temp 97.1°F | Resp 18 | Ht 66.25 in | Wt 152.8 lb

## 2021-12-21 VITALS — BP 126/70 | HR 86 | Temp 98.6°F | Resp 16

## 2021-12-21 DIAGNOSIS — D469 Myelodysplastic syndrome, unspecified: Secondary | ICD-10-CM

## 2021-12-21 DIAGNOSIS — Z95828 Presence of other vascular implants and grafts: Secondary | ICD-10-CM

## 2021-12-21 DIAGNOSIS — C9310 Chronic myelomonocytic leukemia not having achieved remission: Secondary | ICD-10-CM

## 2021-12-21 DIAGNOSIS — Z5111 Encounter for antineoplastic chemotherapy: Secondary | ICD-10-CM | POA: Diagnosis not present

## 2021-12-21 LAB — COMPREHENSIVE METABOLIC PANEL
ALT: 12 U/L (ref 0–44)
AST: 13 U/L — ABNORMAL LOW (ref 15–41)
Albumin: 3.1 g/dL — ABNORMAL LOW (ref 3.5–5.0)
Alkaline Phosphatase: 49 U/L (ref 38–126)
Anion gap: 4 — ABNORMAL LOW (ref 5–15)
BUN: 23 mg/dL (ref 8–23)
CO2: 25 mmol/L (ref 22–32)
Calcium: 8.6 mg/dL — ABNORMAL LOW (ref 8.9–10.3)
Chloride: 107 mmol/L (ref 98–111)
Creatinine, Ser: 1.16 mg/dL (ref 0.61–1.24)
GFR, Estimated: >60 mL/min (ref >60)
Glucose, Bld: 103 mg/dL — ABNORMAL HIGH (ref 70–99)
Potassium: 4 mmol/L (ref 3.5–5.1)
Sodium: 136 mmol/L (ref 135–145)
Total Bilirubin: 0.6 mg/dL (ref 0.3–1.2)
Total Protein: 5.8 g/dL — ABNORMAL LOW (ref 6.5–8.1)

## 2021-12-21 LAB — CBC WITH DIFFERENTIAL/PLATELET
Band Neutrophils: 1 %
Basophils Absolute: 0 10*3/uL (ref 0.0–0.1)
Basophils Relative: 0 %
Eosinophils Absolute: 0 10*3/uL (ref 0.0–0.5)
Eosinophils Relative: 0 %
HCT: 22.7 % — ABNORMAL LOW (ref 39.0–52.0)
Hemoglobin: 7.3 g/dL — ABNORMAL LOW (ref 13.0–17.0)
Lymphocytes Relative: 31 %
Lymphs Abs: 3.1 10*3/uL (ref 0.7–4.0)
MCH: 30.9 pg (ref 26.0–34.0)
MCHC: 32.2 g/dL (ref 30.0–36.0)
MCV: 96.2 fL (ref 80.0–100.0)
Metamyelocytes Relative: 2 %
Monocytes Absolute: 2.9 10*3/uL — ABNORMAL HIGH (ref 0.1–1.0)
Monocytes Relative: 29 %
Neutro Abs: 3.8 10*3/uL (ref 1.7–7.7)
Neutrophils Relative %: 37 %
Platelets: 39 10*3/uL — ABNORMAL LOW (ref 150–400)
RBC: 2.36 MIL/uL — ABNORMAL LOW (ref 4.22–5.81)
RDW: 17.7 % — ABNORMAL HIGH (ref 11.5–15.5)
Smear Review: DECREASED
WBC: 9.9 10*3/uL (ref 4.0–10.5)
nRBC: 1.8 % — ABNORMAL HIGH (ref 0.0–0.2)
nRBC: 3 /100 WBC — ABNORMAL HIGH

## 2021-12-21 LAB — LACTATE DEHYDROGENASE: LDH: 162 U/L (ref 98–192)

## 2021-12-21 LAB — MAGNESIUM: Magnesium: 2.1 mg/dL (ref 1.7–2.4)

## 2021-12-21 MED ORDER — SODIUM CHLORIDE 0.9% FLUSH
10.0000 mL | INTRAVENOUS | Status: DC | PRN
  Administered 2021-12-21: 10 mL

## 2021-12-21 MED ORDER — HEPARIN SOD (PORK) LOCK FLUSH 100 UNIT/ML IV SOLN
500.0000 [IU] | Freq: Once | INTRAVENOUS | Status: AC | PRN
  Administered 2021-12-21: 500 [IU]

## 2021-12-21 MED ORDER — SODIUM CHLORIDE 0.9 % IV SOLN
10.0000 mg | Freq: Once | INTRAVENOUS | Status: AC
  Administered 2021-12-21: 10 mg via INTRAVENOUS
  Filled 2021-12-21: qty 1

## 2021-12-21 MED ORDER — SODIUM CHLORIDE 0.9 % IV SOLN: Freq: Once | INTRAVENOUS | Status: AC

## 2021-12-21 MED ORDER — PALONOSETRON HCL INJECTION 0.25 MG/5ML
0.2500 mg | Freq: Once | INTRAVENOUS | Status: AC
  Administered 2021-12-21: 0.25 mg via INTRAVENOUS
  Filled 2021-12-21: qty 5

## 2021-12-21 MED ORDER — SODIUM CHLORIDE 0.9 % IV SOLN
55.0000 mg/m2 | Freq: Once | INTRAVENOUS | Status: AC
  Administered 2021-12-21: 100 mg via INTRAVENOUS
  Filled 2021-12-21: qty 10

## 2021-12-21 NOTE — Progress Notes (Signed)
Pt presents today for D6 Vidaza per provider's order. Vital signs and labs WNL for treatment. Okay to proceed with treatment today per Dr.K. ? ?D6 Vidaza given today per MD orders. Tolerated infusion without adverse affects. Vital signs stable. No complaints at this time. Discharged from clinic ambulatory in stable condition. Alert and oriented x 3. F/U with [facility] as scheduled.   ?

## 2021-12-21 NOTE — Patient Instructions (Signed)
[facility]  Discharge Instructions: ?Thank you for choosing [facility] to provide your oncology and hematology care.  ?If you have a lab appointment with the [facility], please come in thru the Main Entrance and check in at the main information desk. ? ?Wear comfortable clothing and clothing appropriate for easy access to any Portacath or PICC line.  ? ?We strive to give you quality time with your provider. You may need to reschedule your appointment if you arrive late (15 or more minutes).  Arriving late affects you and other patients whose appointments are after yours.  Also, if you miss three or more appointments without notifying the office, you may be dismissed from the clinic at the provider?s discretion.    ?  ?For prescription refill requests, have your pharmacy contact our office and allow 72 hours for refills to be completed. ?Today you received the following chemotherapy and/or immunotherapy agents D6 Vidaza ?  ?To help prevent nausea and vomiting after your treatment, we encourage you to take your nausea medication as directed. ? ?BELOW ARE SYMPTOMS THAT SHOULD BE REPORTED IMMEDIATELY: ?*FEVER GREATER THAN 100.4 F (38 ?C) OR HIGHER ?*CHILLS OR SWEATING ?*NAUSEA AND VOMITING THAT IS NOT CONTROLLED WITH YOUR NAUSEA MEDICATION ?*UNUSUAL SHORTNESS OF BREATH ?*UNUSUAL BRUISING OR BLEEDING ?*URINARY PROBLEMS (pain or burning when urinating, or frequent urination) ?*BOWEL PROBLEMS (unusual diarrhea, constipation, pain near the anus) ?TENDERNESS IN MOUTH AND THROAT WITH OR WITHOUT PRESENCE OF ULCERS (sore throat, sores in mouth, or a toothache) ?UNUSUAL RASH, SWELLING OR PAIN  ?UNUSUAL VAGINAL DISCHARGE OR ITCHING  ? ?Items with * indicate a potential emergency and should be followed up as soon as possible or go to the Emergency Department if any problems should occur. ? ?Please show the CHEMOTHERAPY ALERT CARD or IMMUNOTHERAPY ALERT CARD at check-in to the Emergency Department  and triage nurse. ? ?Should you have questions after your visit or need to cancel or reschedule your appointment, please contact [facility] [phone]  and follow the prompts.  Office hours are 8:00 a.m. to 4:30 p.m. Monday - Friday. Please note that voicemails left after 4:00 p.m. may not be returned until the following business day.  We are closed weekends and major holidays. You have access to a nurse at all times for urgent questions. Please call the main number to the clinic [phone] and follow the prompts. ? ?For any non-urgent questions, you may also contact your provider using MyChart. We now offer e-Visits for anyone 53 and older to request care online for non-urgent symptoms. For details visit [email or web address]. ?  ?Also download the MyChart app! Go to the app store, search "MyChart", open the app, select [facility], and log in with your MyChart username and password. ? ?Due to Covid, a mask is required upon entering the hospital/clinic. If you do not have a mask, one will be given to you upon arrival. For doctor visits, patients may have 1 support person aged 51 or older with them. For treatment visits, patients cannot have anyone with them due to current Covid guidelines and our immunocompromised population.  ?

## 2021-12-21 NOTE — Progress Notes (Signed)
? ?[facility] ?[address] ?[place], [place] [address] ? ? ?CLINIC:  ?Medical Oncology/Hematology ? ?PCP:  ?Jani Gravel, MD ?[address] / [place] [address] ?[phone] ? ? 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 ?PRIOR THERAPY: none ? ?NGS Results: NF1, TET2, U2 AF-1, DNMT3A mutations positive. ? ?CURRENT THERAPY: Azacitidine IV D1-7 q28d ? 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 ?  ?10/19/2021 -  Chemotherapy  ? Patient is on Treatment Plan : MYELODYSPLASIA  Azacitidine IV D1-7 q28d  ? ?   ? ? ?CANCER STAGING: ? Cancer Staging  ?No matching staging information was found for the patient. ? ?INTERVAL HISTORY:  ?Mr. Anselm Lis, a 88 y.o. male, returns for routine follow-up and consideration for next cycle of chemotherapy. Daouda was last seen on 11/16/2021. ? ?Due for day #6 cycle #3 of Azacitidine today.  ? ?Overall, he tells me he has been feeling pretty well. He denies fevers and infections. He denies current bleeding. He denies ankle swellings. His appetite is good.  ? ?Overall, he feels ready for next cycle of chemo today.  ? ? ?REVIEW OF SYSTEMS:  ?Review of Systems  ?Constitutional:  Negative for appetite change and fever.  ?HENT:   Negative for nosebleeds.   ?Respiratory:  Negative for hemoptysis.   ?Cardiovascular:  Negative for leg swelling.  ?Gastrointestinal:  Negative for blood in stool.  ?Genitourinary:  Negative for hematuria.   ?Hematological:  Does not bruise/bleed easily.  ?Psychiatric/Behavioral:  Positive for sleep disturbance.   ?All other systems reviewed and are negative. ? ?PAST MEDICAL/SURGICAL HISTORY:  ?Past Medical History:  ?Diagnosis Date  ? 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 Hypertension   ? Port-A-Cath in place  10/15/2021  ? ?Past Surgical History:  ?Procedure Laterality Date  ? CARPAL TUNNEL RELEASE Right   ? COLONOSCOPY N/A 09/19/2013  ? Procedure: COLONOSCOPY;  Surgeon: Rogene Houston, MD;  [facility];  Service: Endoscopy;  Laterality: N/A;  830  ? FINGER AMPUTATION Left   ? 4th and 5th  ? left eye blindness Left   ? PORTACATH PLACEMENT Right 10/16/2021  ? Procedure: INSERTION PORT-A-CATH;  Surgeon: Aviva Signs, MD;  Location: [facility];  Service: General;  Laterality: Right;  ? SVT ABLATION N/A 09/21/2018  ? Procedure: SVT ABLATION;  Surgeon: Constance Haw, MD;  Location: [facility];  Service: Cardiovascular;  Laterality: N/A;  ? ? ?SOCIAL HISTORY:  ?Social History  ? ?Socioeconomic History  ? Marital status: Widowed  ?  Spouse name: Not on file  ? Number of children: Not on file  ? Years of education: Not on file  ? Highest education level: Not on file  ?Occupational History  ? Not on file  ?Tobacco Use  ? Smoking status: Former  ?  Packs/day: 0.50  ?  Types: Cigarettes  ?  Start date: 08/09/1956  ?  Quit date: 08/10/1979  ?  Years since quitting: 42.3  ? Smokeless tobacco: Never  ?Vaping Use  ? Vaping Use: Never used  ?Substance and Sexual Activity  ? Alcohol use: No  ?  Alcohol/week: 0.0 standard drinks  ? Drug use: No  ? Sexual activity: Not on file  ?Other Topics Concern  ? Not  on file  ?Social History Narrative  ? Not on file  ? ?Social Determinants of Health  ? ?Financial Resource Strain: Not on file  ?Food Insecurity: Not on file  ?Transportation Needs: Not on file  ?Physical Activity: Not on file  ?Stress: Not on file  ?Social Connections: Not on file  ?Intimate Partner Violence: Not on file  ? ? ?FAMILY HISTORY:  ?Family History  ?Problem Relation Age of Onset  ? CAD Mother   ? CAD Father   ? Cancer Brother   ? ? ?CURRENT MEDICATIONS:  ?Current Outpatient Medications  ?Medication Sig Dispense Refill  ? albuterol (VENTOLIN HFA) 108 (90 Base) MCG/ACT inhaler Inhale 2 puffs into the lungs  every 6 (six) hours as needed for wheezing or shortness of breath.    ? alfuzosin (UROXATRAL) 10 MG 24 hr tablet Take 1 tablet (10 mg total) by mouth daily with breakfast. 30 tablet 11  ? azaCITIDine 5 mg/2 mLs in lactated ringers infusion Inject 75 mg/m2 into the vein daily. Days 1-5 every 28 days    ? cholecalciferol (VITAMIN D) 1000 units tablet Take 1,000 Units by mouth daily.    ? cloNIDine (CATAPRES) 0.1 MG tablet Take 0.1 mg by mouth daily. (Patient not taking: Reported on 12/17/2021)    ? docusate sodium (COLACE) 100 MG capsule Take 1 capsule (100 mg total) by mouth 2 (two) times daily. 60 capsule 1  ? hydrochlorothiazide (HYDRODIURIL) 25 MG tablet Take 25 mg by mouth daily.  (Patient not taking: Reported on 12/17/2021)    ? levocetirizine (XYZAL) 5 MG tablet Take 5 mg by mouth daily.    ? lidocaine-prilocaine (EMLA) cream Apply a dime-sized amount to port a cath site (do not rub in) and cover with plastic wrap one hour prior to infusion appointments 30 g 3  ? lisinopril (PRINIVIL,ZESTRIL) 40 MG tablet Take 40 mg by mouth daily.  (Patient not taking: Reported on 12/17/2021)    ? pantoprazole (PROTONIX) 40 MG tablet Take 1 tablet (40 mg total) by mouth daily. 30 tablet 1  ? prochlorperazine (COMPAZINE) 10 MG tablet TAKE (1) TABLET BY MOUTH EVERY SIX HOURS AS NEEDED. (Patient not taking: Reported on 12/17/2021) 60 tablet 3  ? vitamin B-12 (CYANOCOBALAMIN) 1000 MCG tablet Take 1,000 mcg by mouth daily.    ? ?No current facility-administered medications for this visit.  ? ?Facility-Administered Medications Ordered in Other Visits  ?Medication Dose Route Frequency Provider Last Rate Last Admin  ? 0.9 %  sodium chloride infusion (Manually program via Guardrails IV Fluids)  250 mL Intravenous Once Katragadda, Sreedhar, MD      ? ? ?ALLERGIES:  ?Allergies  ?Allergen Reactions  ? Aspirin Other (See Comments)  ?  Gi upset, can tolerate baby aspirin  ? Flomax [Tamsulosin] Nausea Only  ? ? ?PHYSICAL EXAM:  ?Performance  status (ECOG): 1 - Symptomatic but completely ambulatory ? ?There were no vitals filed for this visit. ?Wt Readings from Last 3 Encounters:  ?12/17/21 154 lb (69.9 kg)  ?12/03/21 152 lb (68.9 kg)  ?11/30/21 153 lb 7 oz (69.6 kg)  ? ?Physical Exam ? ?LABORATORY DATA:  ?I have reviewed the labs as listed.  ? ?  Latest Ref Rng & Units 12/14/2021  ? 10:01 AM 12/07/2021  ? 10:47 AM 12/03/2021  ? 11:45 AM  ?CBC  ?WBC 4.0 - 10.5 K/uL 6.1   6.5   4.9    ?Hemoglobin 13.0 - 17.0 g/dL 7.0   7.8   8.8    ?  Hematocrit 39.0 - 52.0 % 21.0   23.3   25.8    ?Platelets 150 - 400 K/uL 40   33     ? ? ?  Latest Ref Rng & Units 12/17/2021  ?  8:12 AM 12/14/2021  ? 10:01 AM 12/03/2021  ? 11:45 AM  ?CMP  ?Glucose 70 - 99 mg/dL 116   124   104    ?BUN 8 - 23 mg/dL 32   49   35    ?Creatinine 0.61 - 1.24 mg/dL 1.06   1.74   1.23    ?Sodium 135 - 145 mmol/L 138   139   138    ?Potassium 3.5 - 5.1 mmol/L 3.9   3.9   4.1    ?Chloride 98 - 111 mmol/L 109   109   106    ?CO2 22 - 32 mmol/L _0 ?Calcium 8.9 - 10.3 mg/dL 8.5   8.8   8.9    ?Total Protein 6.5 - 8.1 g/dL 6.2   6.6     ?Total Bilirubin 0.3 - 1.2 mg/dL 0.4   0.4     ?Alkaline Phos 38 - 126 U/L 52   50     ?AST 15 - 41 U/L 15   17     ?ALT 0 - 44 U/L 17   19     ? ? ?DIAGNOSTIC IMAGING:  ?I have independently reviewed the scans and discussed with the patient. ?No results found.  ? 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  Hemoglobin 15.5 on 09/20/2018.  Platelet count on 09/20/1998 2155. ?- He was on aspirin 81 mg at presentation.  He received 3 units PRBC.  Ferritin was 244 and percent saturation 37.  Z00 was 174 and folic acid normal.  Creatinine was normal. ?- MMA, copper, folic acid and LDH was normal.  SPEP negative. ?- Serum EPO 140.  Kappa light chains elevated at 32.4 with ratio 1.55. ?- Bone marrow biopsy on 09/18/2021:  Hypercellular marrow with trilineage dyspoiesis and monocytosis.  Dyspoiesis in the granulocytic precursors without apparent late maturation of neutrophils, increased monocytes but no significant blast population.

## 2021-12-21 NOTE — Progress Notes (Signed)
Patient has been examined by Dr. Katragadda, and vital signs and labs have been reviewed. ANC, Creatinine, LFTs, hemoglobin, and platelets are within treatment parameters per M.D. - pt may proceed with treatment.    °

## 2021-12-21 NOTE — Progress Notes (Signed)
CRITICAL VALUE ALERT ?Critical value received:  WBC 1.2,ANC 0.3, platelets 29 ?Date of notification:  12-21-21 ?Time of notification: 6962 ?Critical value read back:  Yes.   ?Nurse who received alert:  C. Barnard Sharps RN ?MD notified time and response:  9528, no new orders.   ?

## 2021-12-22 ENCOUNTER — Ambulatory Visit: Payer: Medicare HMO | Admitting: Urology

## 2021-12-22 ENCOUNTER — Encounter (HOSPITAL_COMMUNITY): Payer: Self-pay

## 2021-12-22 ENCOUNTER — Inpatient Hospital Stay (HOSPITAL_COMMUNITY): Payer: Medicare HMO

## 2021-12-22 ENCOUNTER — Encounter: Payer: Self-pay | Admitting: Urology

## 2021-12-22 VITALS — BP 120/68 | HR 82 | Temp 97.0°F | Resp 18

## 2021-12-22 VITALS — BP 136/67 | HR 105

## 2021-12-22 DIAGNOSIS — R339 Retention of urine, unspecified: Secondary | ICD-10-CM

## 2021-12-22 DIAGNOSIS — N401 Enlarged prostate with lower urinary tract symptoms: Secondary | ICD-10-CM

## 2021-12-22 DIAGNOSIS — Z95828 Presence of other vascular implants and grafts: Secondary | ICD-10-CM

## 2021-12-22 DIAGNOSIS — C9310 Chronic myelomonocytic leukemia not having achieved remission: Secondary | ICD-10-CM

## 2021-12-22 DIAGNOSIS — N138 Other obstructive and reflux uropathy: Secondary | ICD-10-CM

## 2021-12-22 DIAGNOSIS — R310 Gross hematuria: Secondary | ICD-10-CM

## 2021-12-22 DIAGNOSIS — Z5111 Encounter for antineoplastic chemotherapy: Secondary | ICD-10-CM | POA: Diagnosis not present

## 2021-12-22 LAB — BLADDER SCAN AMB NON-IMAGING: Scan Result: 6

## 2021-12-22 MED ORDER — HEPARIN SOD (PORK) LOCK FLUSH 100 UNIT/ML IV SOLN
500.0000 [IU] | Freq: Once | INTRAVENOUS | Status: AC | PRN
  Administered 2021-12-22: 500 [IU]

## 2021-12-22 MED ORDER — SODIUM CHLORIDE 0.9 % IV SOLN
10.0000 mg | Freq: Once | INTRAVENOUS | Status: AC
  Administered 2021-12-22: 10 mg via INTRAVENOUS
  Filled 2021-12-22: qty 10

## 2021-12-22 MED ORDER — SODIUM CHLORIDE 0.9% FLUSH
10.0000 mL | INTRAVENOUS | Status: DC | PRN
  Administered 2021-12-22 (×2): 10 mL

## 2021-12-22 MED ORDER — CEPHALEXIN 500 MG PO CAPS: 500.0000 mg | ORAL_CAPSULE | Freq: Two times a day (BID) | ORAL | 0 refills | Status: AC

## 2021-12-22 MED ORDER — SODIUM CHLORIDE 0.9 % IV SOLN
55.0000 mg/m2 | Freq: Once | INTRAVENOUS | Status: AC
  Administered 2021-12-22: 100 mg via INTRAVENOUS
  Filled 2021-12-22: qty 10

## 2021-12-22 MED ORDER — SODIUM CHLORIDE 0.9 % IV SOLN: Freq: Once | INTRAVENOUS | Status: AC

## 2021-12-22 NOTE — Progress Notes (Signed)
Patient tolerated chemotherapy with no complaints voiced.  Side effects with management reviewed with understanding verbalized.  Port site clean and dry with no bruising or swelling noted at site.  Good blood return noted before and after administration of chemotherapy.  Band aid applied.  Patient left in satisfactory condition with VSS and no s/s of distress noted.   

## 2021-12-22 NOTE — Progress Notes (Signed)
post void residual=6 

## 2021-12-22 NOTE — Patient Instructions (Signed)
[facility]  Discharge Instructions: ?Thank you for choosing [facility] to provide your oncology and hematology care.  ?If you have a lab appointment with the [facility], please come in thru the Main Entrance and check in at the main information desk. ? ?Wear comfortable clothing and clothing appropriate for easy access to any Portacath or PICC line.  ? ?We strive to give you quality time with your provider. You may need to reschedule your appointment if you arrive late (15 or more minutes).  Arriving late affects you and other patients whose appointments are after yours.  Also, if you miss three or more appointments without notifying the office, you may be dismissed from the clinic at the provider?s discretion.    ?  ?For prescription refill requests, have your pharmacy contact our office and allow 72 hours for refills to be completed.   ? ?Today you received the following chemotherapy and/or immunotherapy agents vidaza.  ? ?Azacitidine suspension for injection (subcutaneous use) ?What is this medication? ?AZACITIDINE (ay [place]) is a chemotherapy drug. This medicine reduces the growth of cancer cells and can suppress the immune system. It is used for treating myelodysplastic syndrome or some types of leukemia. ?This medicine may be used for other purposes; ask your health care provider or pharmacist if you have questions. ?COMMON BRAND NAME(S): Vidaza ?What should I tell my care team before I take this medication? ?They need to know if you have any of these conditions: ?kidney disease ?liver disease ?liver tumors ?an unusual or allergic reaction to azacitidine, mannitol, other medicines, foods, dyes, or preservatives ?pregnant or trying to get pregnant ?breast-feeding ?How should I use this medication? ?This medicine is for injection under the skin. It is administered in a hospital or clinic by a specially trained health care professional. ?Talk to your pediatrician regarding  the use of this medicine in children. While this drug may be prescribed for selected conditions, precautions do apply. ?Overdosage: If you think you have taken too much of this medicine contact a poison control center or emergency room at once. ?NOTE: This medicine is only for you. Do not share this medicine with others. ?What if I miss a dose? ?It is important not to miss your dose. Call your doctor or health care professional if you are unable to keep an appointment. ?What may interact with this medication? ?Interactions have not been studied. ?Give your health care provider a list of all the medicines, herbs, non-prescription drugs, or dietary supplements you use. Also tell them if you smoke, drink alcohol, or use illegal drugs. Some items may interact with your medicine. ?This list may not describe all possible interactions. Give your health care provider a list of all the medicines, herbs, non-prescription drugs, or dietary supplements you use. Also tell them if you smoke, drink alcohol, or use illegal drugs. Some items may interact with your medicine. ?What should I watch for while using this medication? ?Visit your doctor for checks on your progress. This drug may make you feel generally unwell. This is not uncommon, as chemotherapy can affect healthy cells as well as cancer cells. Report any side effects. Continue your course of treatment even though you feel ill unless your doctor tells you to stop. ?In some cases, you may be given additional medicines to help with side effects. Follow all directions for their use. ?Call your doctor or health care professional for advice if you get a fever, chills or sore throat, or  other symptoms of a cold or flu. Do not treat yourself. This drug decreases your body's ability to fight infections. Try to avoid being around people who are sick. ?This medicine may increase your risk to bruise or bleed. Call your doctor or health care professional if you notice any unusual  bleeding. ?You may need blood work done while you are taking this medicine. ?Do not become pregnant while taking this medicine and for 6 months after the last dose. Women should inform their doctor if they wish to become pregnant or think they might be pregnant. Men should not father a child while taking this medicine and for 3 months after the last dose. There is a potential for serious side effects to an unborn child. Talk to your health care professional or pharmacist for more information. Do not breast-feed an infant while taking this medicine and for 1 week after the last dose. ?This medicine may interfere with the ability to have a child. Talk with your doctor or health care professional if you are concerned about your fertility. ?What side effects may I notice from receiving this medication? ?Side effects that you should report to your doctor or health care professional as soon as possible: ?allergic reactions like skin rash, itching or hives, swelling of the face, lips, or tongue ?low blood counts - this medicine may decrease the number of white blood cells, red blood cells and platelets. You may be at increased risk for infections and bleeding. ?signs of infection - fever or chills, cough, sore throat, pain passing urine ?signs of decreased platelets or bleeding - bruising, pinpoint red spots on the skin, black, tarry stools, blood in the urine ?signs of decreased red blood cells - unusually weak or tired, fainting spells, lightheadedness ?signs and symptoms of kidney injury like trouble passing urine or change in the amount of urine ?signs and symptoms of liver injury like dark yellow or brown urine; general ill feeling or flu-like symptoms; light-colored stools; loss of appetite; nausea; right upper belly pain; unusually weak or tired; yellowing of the eyes or skin ?Side effects that usually do not require medical attention (report to your doctor or health care professional if they continue or are  bothersome): ?constipation ?diarrhea ?nausea, vomiting ?pain or redness at the injection site ?unusually weak or tired ?This list may not describe all possible side effects. Call your doctor for medical advice about side effects. You may report side effects to FDA at 1-800-FDA-1088. ?Where should I keep my medication? ?This drug is given in a hospital or clinic and will not be stored at home. ?NOTE: This sheet is a summary. It may not cover all possible information. If you have questions about this medicine, talk to your doctor, pharmacist, or health care provider. ?? 2023 Elsevier/Gold Standard (2016-08-25 00:00:00) ?    ?  ?To help prevent nausea and vomiting after your treatment, we encourage you to take your nausea medication as directed. ? ?BELOW ARE SYMPTOMS THAT SHOULD BE REPORTED IMMEDIATELY: ?*FEVER GREATER THAN 100.4 F (38 ?C) OR HIGHER ?*CHILLS OR SWEATING ?*NAUSEA AND VOMITING THAT IS NOT CONTROLLED WITH YOUR NAUSEA MEDICATION ?*UNUSUAL SHORTNESS OF BREATH ?*UNUSUAL BRUISING OR BLEEDING ?*URINARY PROBLEMS (pain or burning when urinating, or frequent urination) ?*BOWEL PROBLEMS (unusual diarrhea, constipation, pain near the anus) ?TENDERNESS IN MOUTH AND THROAT WITH OR WITHOUT PRESENCE OF ULCERS (sore throat, sores in mouth, or a toothache) ?UNUSUAL RASH, SWELLING OR PAIN  ?UNUSUAL VAGINAL DISCHARGE OR ITCHING  ? ?Items with * indicate a  potential emergency and should be followed up as soon as possible or go to the Emergency Department if any problems should occur. ? ?Please show the CHEMOTHERAPY ALERT CARD or IMMUNOTHERAPY ALERT CARD at check-in to the Emergency Department and triage nurse. ? ?Should you have questions after your visit or need to cancel or reschedule your appointment, please contact [facility] [phone]  and follow the prompts.  Office hours are 8:00 a.m. to 4:30 p.m. Monday - Friday. Please note that voicemails left after 4:00 p.m. may not be returned until the  following business day.  We are closed weekends and major holidays. You have access to a nurse at all times for urgent questions. Please call the main number to the clinic [phone] and follow the prompts. ? ?For a

## 2021-12-22 NOTE — Progress Notes (Signed)
History of Present Illness: Here for f/u of AUR. ? ?5.2.2023: Here for follow-up of hematuria/retention. ? ?He developed gross hematuria following placement of a Foley catheter about a week ago.  Apparently the balloon was inflated within his prostate.  He had immediate pain and gross hematuria.  He came here and catheter was replaced, bladder was irrigated, and his urine has been clear since. ? ?He feels that his retention was secondary to constipation.  His bowels are acting normally now.  He was not placed on any alpha blockers. ?  ?5.16.2023:  ? ? ?He does have a history of elevated PSA. ? ? He underwent ultrasound biopsy of his prostate in January 2016.  At that time PSA was 8.74 with percent free 16.  Prostatic volume was 61 mL, PSA density 0.14.  There was 1 core at the right base which revealed atypia.  All of the cores were benign. ?  ?Seen in October 2020.  At that time PSA was 13.3. ?  ?3.15.2022: PSA 14.8 ?  ?3.21.2023:Here for routine follow-up. No recent PSA. 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  He has had no real urinary symptoms.  He has a good stream and feels like he empties well.   ? ? ?Past Medical History:  ?Diagnosis Date  ? 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 Hypertension   ? Port-A-Cath in place 10/15/2021  ? ? ?Past Surgical History:  ?Procedure Laterality Date  ? CARPAL TUNNEL RELEASE Right   ? COLONOSCOPY N/A 09/19/2013  ? Procedure: COLONOSCOPY;  Surgeon: Rogene Houston, MD;  [facility];  Service: Endoscopy;  Laterality: N/A;  830  ? FINGER AMPUTATION Left   ? 4th and 5th  ? left eye blindness Left   ? PORTACATH PLACEMENT Right 10/16/2021  ? Procedure: INSERTION PORT-A-CATH;  Surgeon: Aviva Signs, MD;  Location: [facility];  Service: General;  Laterality: Right;  ? SVT ABLATION N/A 09/21/2018  ? Procedure: SVT ABLATION;  Surgeon: Constance Haw, MD;  Location: [facility];  Service: Cardiovascular;  Laterality: N/A;  ? ? ?Home  Medications:  ?Allergies as of 12/22/2021   ? ?   Reactions  ? Aspirin Other (See Comments)  ? Gi upset, can tolerate baby aspirin  ? Flomax [tamsulosin] Nausea Only  ? ?  ? ?  ?Medication List  ?  ? ?  ? Accurate as of Dec 22, 2021  6:56 AM. If you have any questions, ask your nurse or doctor.  ?  ?  ? ?  ? ?albuterol 108 (90 Base) MCG/ACT inhaler ?Commonly known as: VENTOLIN HFA ?Inhale 2 puffs into the lungs every 6 (six) hours as needed for wheezing or shortness of breath. ?  ?alfuzosin 10 MG 24 hr tablet ?Commonly known as: UROXATRAL ?Take 1 tablet (10 mg total) by mouth daily with breakfast. ?  ?azaCITIDine 5 mg/2 mLs in lactated ringers infusion ?Inject 75 mg/m2 into the vein daily. Days 1-5 every 28 days ?  ?cholecalciferol 1000 units tablet ?Commonly known as: VITAMIN D ?Take 1,000 Units by mouth daily. ?  ?cloNIDine 0.1 MG tablet ?Commonly known as: CATAPRES ?Take 0.1 mg by mouth daily. ?  ?docusate sodium 100 MG capsule ?Commonly known as: Colace ?Take 1 capsule (100 mg total) by mouth 2 (two) times daily. ?  ?hydrochlorothiazide 25 MG tablet ?Commonly known as: HYDRODIURIL ?Take 25 mg by mouth daily. ?  ?levocetirizine 5 MG tablet ?  Commonly known as: XYZAL ?Take 5 mg by mouth daily. ?  ?lidocaine-prilocaine cream ?Commonly known as: EMLA ?Apply a dime-sized amount to port a cath site (do not rub in) and cover with plastic wrap one hour prior to infusion appointments ?  ?lisinopril 40 MG tablet ?Commonly known as: ZESTRIL ?Take 40 mg by mouth daily. ?  ?pantoprazole 40 MG tablet ?Commonly known as: PROTONIX ?Take 1 tablet (40 mg total) by mouth daily. ?  ?prochlorperazine 10 MG tablet ?Commonly known as: COMPAZINE ?TAKE (1) TABLET BY MOUTH EVERY SIX HOURS AS NEEDED. ?  ?vitamin B-12 1000 MCG tablet ?Commonly known as: CYANOCOBALAMIN ?Take 1,000 mcg by mouth daily. ?  ? ?  ? ? ?Allergies:  ?Allergies  ?Allergen Reactions  ? Aspirin Other (See Comments)  ?  Gi upset, can tolerate baby aspirin  ? Flomax  [Tamsulosin] Nausea Only  ? ? ?Family History  ?Problem Relation Age of Onset  ? CAD Mother   ? CAD Father   ? Cancer Brother   ? ? ?Social History:  reports that he quit smoking about 42 years ago. His smoking use included cigarettes. He started smoking about 65 years ago. He smoked an average of .5 packs per day. He has never used smokeless tobacco. He reports that he does not drink alcohol and does not use drugs. ? ?ROS: ?A complete review of systems was performed.  All systems are negative except for pertinent findings as noted. ? ?Physical Exam:  ?Vital signs in last 24 hours: ?There were no vitals taken for this visit. ?Constitutional:  Alert and oriented, No acute distress ?Cardiovascular: Regular rate  ?Respiratory: Normal respiratory effort ?GI: Abdomen is soft, nontender, nondistended, no abdominal masses. No CVAT.  ?Genitourinary: Normal male phallus, testes are descended bilaterally and non-tender and without masses, scrotum is normal in appearance without lesions or masses, perineum is normal on inspection. ?Lymphatic: No lymphadenopathy ?Neurologic: Grossly intact, no focal deficits ?Psychiatric: Normal mood and affect ? ?I have reviewed prior pt notes ? ?I have reviewed urinalysis results--pyuria present ? ?I have independently reviewed prior imaging--prostate ultrasound volume, bladder scan volume ? ?I have reviewed prior PSA results ? ? ?Impression/Assessment:  ?1.  History of elevated PSA with negative biopsy in 2016.  PSA is now up close to 15.  Patient is 61 and has myelodysplasia so will not be aggressive with repeat surveillance ? ?2.  Recent gross hematuria secondary to traumatic catheter placement. ? ?3.  Pyuria, r resolved hematuria ? ?Plan:  ?1.  Urine was cultured today and I did send in antibiotics ? ?2.  I will follow-up in 6 months ? ?

## 2021-12-23 ENCOUNTER — Ambulatory Visit (HOSPITAL_COMMUNITY): Payer: Medicare HMO

## 2021-12-23 LAB — MICROSCOPIC EXAMINATION
Renal Epithel, UA: NONE SEEN /hpf
WBC, UA: >30 /hpf — AB (ref 0–5)

## 2021-12-23 LAB — URINALYSIS, ROUTINE W REFLEX MICROSCOPIC
Bilirubin, UA: NEGATIVE
Glucose, UA: NEGATIVE
Ketones, UA: NEGATIVE
Nitrite, UA: POSITIVE — AB
Protein,UA: NEGATIVE
Specific Gravity, UA: 1.02 (ref 1.005–1.030)
Urobilinogen, Ur: 2 mg/dL — ABNORMAL HIGH (ref 0.2–1.0)
pH, UA: 6.5 (ref 5.0–7.5)

## 2021-12-24 ENCOUNTER — Ambulatory Visit (HOSPITAL_COMMUNITY): Payer: Medicare HMO

## 2021-12-24 LAB — URINE CULTURE

## 2021-12-25 ENCOUNTER — Ambulatory Visit (HOSPITAL_COMMUNITY): Payer: Medicare HMO

## 2021-12-28 ENCOUNTER — Ambulatory Visit (HOSPITAL_COMMUNITY): Payer: Medicare HMO

## 2021-12-28 ENCOUNTER — Other Ambulatory Visit (HOSPITAL_COMMUNITY): Payer: Medicare HMO

## 2021-12-29 ENCOUNTER — Ambulatory Visit (HOSPITAL_COMMUNITY): Payer: Medicare HMO

## 2022-01-06 ENCOUNTER — Inpatient Hospital Stay (HOSPITAL_COMMUNITY): Payer: Medicare HMO

## 2022-01-06 VITALS — BP 160/80 | HR 64 | Temp 97.9°F | Resp 18

## 2022-01-06 DIAGNOSIS — D469 Myelodysplastic syndrome, unspecified: Secondary | ICD-10-CM

## 2022-01-06 DIAGNOSIS — C9310 Chronic myelomonocytic leukemia not having achieved remission: Secondary | ICD-10-CM

## 2022-01-06 DIAGNOSIS — Z5111 Encounter for antineoplastic chemotherapy: Secondary | ICD-10-CM | POA: Diagnosis not present

## 2022-01-06 LAB — COMPREHENSIVE METABOLIC PANEL
ALT: 11 U/L (ref 0–44)
AST: 18 U/L (ref 15–41)
Albumin: 3.3 g/dL — ABNORMAL LOW (ref 3.5–5.0)
Alkaline Phosphatase: 48 U/L (ref 38–126)
Anion gap: 3 — ABNORMAL LOW (ref 5–15)
BUN: 21 mg/dL (ref 8–23)
CO2: 25 mmol/L (ref 22–32)
Calcium: 8.5 mg/dL — ABNORMAL LOW (ref 8.9–10.3)
Chloride: 109 mmol/L (ref 98–111)
Creatinine, Ser: 1.08 mg/dL (ref 0.61–1.24)
GFR, Estimated: >60 mL/min (ref >60)
Glucose, Bld: 93 mg/dL (ref 70–99)
Potassium: 3.8 mmol/L (ref 3.5–5.1)
Sodium: 137 mmol/L (ref 135–145)
Total Bilirubin: 1 mg/dL (ref 0.3–1.2)
Total Protein: 6 g/dL — ABNORMAL LOW (ref 6.5–8.1)

## 2022-01-06 LAB — CBC WITH DIFFERENTIAL/PLATELET
Abs Immature Granulocytes: 0.44 10*3/uL — ABNORMAL HIGH (ref 0.00–0.07)
Basophils Absolute: 0 10*3/uL (ref 0.0–0.1)
Basophils Relative: 0 %
Eosinophils Absolute: 0 10*3/uL (ref 0.0–0.5)
Eosinophils Relative: 0 %
HCT: 19.6 % — ABNORMAL LOW (ref 39.0–52.0)
Hemoglobin: 6.3 g/dL — CL (ref 13.0–17.0)
Immature Granulocytes: 8 %
Lymphocytes Relative: 39 %
Lymphs Abs: 2.1 10*3/uL (ref 0.7–4.0)
MCH: 31.8 pg (ref 26.0–34.0)
MCHC: 32.1 g/dL (ref 30.0–36.0)
MCV: 99 fL (ref 80.0–100.0)
Monocytes Absolute: 1.8 10*3/uL — ABNORMAL HIGH (ref 0.1–1.0)
Monocytes Relative: 33 %
Neutro Abs: 1.1 10*3/uL — ABNORMAL LOW (ref 1.7–7.7)
Neutrophils Relative %: 20 %
Platelets: 40 10*3/uL — ABNORMAL LOW (ref 150–400)
RBC: 1.98 MIL/uL — ABNORMAL LOW (ref 4.22–5.81)
RDW: 19.3 % — ABNORMAL HIGH (ref 11.5–15.5)
WBC Morphology: REACTIVE
WBC: 5.4 10*3/uL (ref 4.0–10.5)
nRBC: 3.9 % — ABNORMAL HIGH (ref 0.0–0.2)

## 2022-01-06 LAB — SAMPLE TO BLOOD BANK

## 2022-01-06 LAB — PREPARE RBC (CROSSMATCH)

## 2022-01-06 LAB — MAGNESIUM: Magnesium: 2 mg/dL (ref 1.7–2.4)

## 2022-01-06 MED ORDER — HEPARIN SOD (PORK) LOCK FLUSH 100 UNIT/ML IV SOLN
500.0000 [IU] | Freq: Every day | INTRAVENOUS | Status: AC | PRN
  Administered 2022-01-06: 500 [IU]

## 2022-01-06 MED ORDER — SODIUM CHLORIDE 0.9% IV SOLUTION
250.0000 mL | Freq: Once | INTRAVENOUS | Status: AC
  Administered 2022-01-06: 250 mL via INTRAVENOUS

## 2022-01-06 MED ORDER — HEPARIN SOD (PORK) LOCK FLUSH 100 UNIT/ML IV SOLN: 500.0000 [IU] | Freq: Every day | INTRAVENOUS | Status: DC | PRN

## 2022-01-06 MED ORDER — DIPHENHYDRAMINE HCL 25 MG PO CAPS
25.0000 mg | ORAL_CAPSULE | Freq: Once | ORAL | Status: AC
  Administered 2022-01-06: 25 mg via ORAL
  Filled 2022-01-06: qty 1

## 2022-01-06 MED ORDER — ACETAMINOPHEN 325 MG PO TABS
650.0000 mg | ORAL_TABLET | Freq: Once | ORAL | Status: AC
  Administered 2022-01-06: 650 mg via ORAL
  Filled 2022-01-06: qty 2

## 2022-01-06 MED ORDER — SODIUM CHLORIDE 0.9% FLUSH
10.0000 mL | INTRAVENOUS | Status: AC | PRN
  Administered 2022-01-06: 10 mL

## 2022-01-06 NOTE — Progress Notes (Unsigned)
CRITICAL VALUE ALERT Critical value received:  hemoglobin 6.3 Date of notification:  01-06-22 Time of notification: 0943 Critical value read back:  Yes.   Nurse who received alert:  C. Madyson Lukach RN MD notified time and response:  [phone], will give 2 units of blood today per MD.     2 units of blood given per orders. Patient tolerated it well without problems. Vitals stable and discharged home from clinic ambulatory. Follow up as scheduled.

## 2022-01-07 ENCOUNTER — Encounter (HOSPITAL_COMMUNITY): Payer: Self-pay | Admitting: Hematology

## 2022-01-07 LAB — BPAM RBC
Blood Product Expiration Date: 202306252359
Blood Product Expiration Date: 202307012359
ISSUE DATE / TIME: 202305311038
ISSUE DATE / TIME: 202305311222
Unit Type and Rh: 1700
Unit Type and Rh: 5100

## 2022-01-07 LAB — TYPE AND SCREEN
ABO/RH(D): B POS
Antibody Screen: NEGATIVE
Unit division: 0
Unit division: 0

## 2022-01-07 NOTE — Patient Instructions (Signed)
[facility]  Discharge Instructions: Thank you for choosing [facility] to provide your oncology and hematology care.  If you have a lab appointment with the Cancer Center, please come in thru the Main Entrance and check in at the main information desk.  Wear comfortable clothing and clothing appropriate for easy access to any Portacath or PICC line.   We strive to give you quality time with your provider. You may need to reschedule your appointment if you arrive late (15 or more minutes).  Arriving late affects you and other patients whose appointments are after yours.  Also, if you miss three or more appointments without notifying the office, you may be dismissed from the clinic at the provider's discretion.      For prescription refill requests, have your pharmacy contact our office and allow 72 hours for refills to be completed.        To help prevent nausea and vomiting after your treatment, we encourage you to take your nausea medication as directed.  BELOW ARE SYMPTOMS THAT SHOULD BE REPORTED IMMEDIATELY: *FEVER GREATER THAN 100.4 F (38 C) OR HIGHER *CHILLS OR SWEATING *NAUSEA AND VOMITING THAT IS NOT CONTROLLED WITH YOUR NAUSEA MEDICATION *UNUSUAL SHORTNESS OF BREATH *UNUSUAL BRUISING OR BLEEDING *URINARY PROBLEMS (pain or burning when urinating, or frequent urination) *BOWEL PROBLEMS (unusual diarrhea, constipation, pain near the anus) TENDERNESS IN MOUTH AND THROAT WITH OR WITHOUT PRESENCE OF ULCERS (sore throat, sores in mouth, or a toothache) UNUSUAL RASH, SWELLING OR PAIN  UNUSUAL VAGINAL DISCHARGE OR ITCHING   Items with * indicate a potential emergency and should be followed up as soon as possible or go to the Emergency Department if any problems should occur.  Please show the CHEMOTHERAPY ALERT CARD or IMMUNOTHERAPY ALERT CARD at check-in to the Emergency Department and triage nurse.  Should you have questions after your visit or need to cancel  or reschedule your appointment, please contact [facility] [phone]  and follow the prompts.  Office hours are 8:00 a.m. to 4:30 p.m. Monday - Friday. Please note that voicemails left after 4:00 p.m. may not be returned until the following business day.  We are closed weekends and major holidays. You have access to a nurse at all times for urgent questions. Please call the main number to the clinic [phone] and follow the prompts.  For any non-urgent questions, you may also contact your provider using MyChart. We now offer e-Visits for anyone 18 and older to request care online for non-urgent symptoms. For details visit [email or web address].   Also download the MyChart app! Go to the app store, search "MyChart", open the app, select [facility], and log in with your MyChart username and password.  Due to Covid, a mask is required upon entering the hospital/clinic. If you do not have a mask, one will be given to you upon arrival. For doctor visits, patients may have 1 support person aged 18 or older with them. For treatment visits, patients cannot have anyone with them due to current Covid guidelines and our immunocompromised population.  

## 2022-01-11 ENCOUNTER — Ambulatory Visit (HOSPITAL_COMMUNITY): Payer: Medicare HMO

## 2022-01-11 ENCOUNTER — Encounter (HOSPITAL_COMMUNITY): Payer: Medicare HMO | Admitting: Dietician

## 2022-01-11 ENCOUNTER — Other Ambulatory Visit (HOSPITAL_COMMUNITY): Payer: Medicare HMO

## 2022-01-11 ENCOUNTER — Ambulatory Visit (HOSPITAL_COMMUNITY): Payer: Medicare HMO | Admitting: Hematology

## 2022-01-12 ENCOUNTER — Ambulatory Visit (HOSPITAL_COMMUNITY): Payer: Medicare HMO

## 2022-01-13 ENCOUNTER — Ambulatory Visit (HOSPITAL_COMMUNITY): Payer: Medicare HMO

## 2022-01-14 ENCOUNTER — Ambulatory Visit (HOSPITAL_COMMUNITY): Payer: Medicare HMO

## 2022-01-15 ENCOUNTER — Ambulatory Visit (HOSPITAL_COMMUNITY): Payer: Medicare HMO

## 2022-01-18 ENCOUNTER — Ambulatory Visit (HOSPITAL_COMMUNITY): Payer: Medicare HMO

## 2022-01-18 ENCOUNTER — Other Ambulatory Visit (HOSPITAL_COMMUNITY): Payer: Medicare HMO

## 2022-01-19 ENCOUNTER — Ambulatory Visit (HOSPITAL_COMMUNITY): Payer: Medicare HMO

## 2022-01-25 ENCOUNTER — Inpatient Hospital Stay (HOSPITAL_COMMUNITY): Payer: Medicare HMO | Admitting: Hematology

## 2022-01-25 ENCOUNTER — Inpatient Hospital Stay (HOSPITAL_COMMUNITY): Payer: Medicare HMO

## 2022-01-25 ENCOUNTER — Inpatient Hospital Stay (HOSPITAL_COMMUNITY): Payer: Medicare HMO | Attending: Hematology

## 2022-01-25 ENCOUNTER — Inpatient Hospital Stay (HOSPITAL_COMMUNITY): Payer: Medicare HMO | Admitting: Dietician

## 2022-01-25 VITALS — BP 138/78 | HR 95 | Temp 97.7°F | Resp 17 | Ht 66.0 in | Wt 146.5 lb

## 2022-01-25 DIAGNOSIS — D539 Nutritional anemia, unspecified: Secondary | ICD-10-CM | POA: Insufficient documentation

## 2022-01-25 DIAGNOSIS — D469 Myelodysplastic syndrome, unspecified: Secondary | ICD-10-CM

## 2022-01-25 DIAGNOSIS — Z87891 Personal history of nicotine dependence: Secondary | ICD-10-CM | POA: Insufficient documentation

## 2022-01-25 DIAGNOSIS — I10 Essential (primary) hypertension: Secondary | ICD-10-CM | POA: Diagnosis not present

## 2022-01-25 DIAGNOSIS — D696 Thrombocytopenia, unspecified: Secondary | ICD-10-CM | POA: Insufficient documentation

## 2022-01-25 DIAGNOSIS — Z800 Family history of malignant neoplasm of digestive organs: Secondary | ICD-10-CM | POA: Diagnosis not present

## 2022-01-25 DIAGNOSIS — C9310 Chronic myelomonocytic leukemia not having achieved remission: Secondary | ICD-10-CM | POA: Diagnosis present

## 2022-01-25 DIAGNOSIS — K5900 Constipation, unspecified: Secondary | ICD-10-CM | POA: Insufficient documentation

## 2022-01-25 DIAGNOSIS — Z79899 Other long term (current) drug therapy: Secondary | ICD-10-CM | POA: Insufficient documentation

## 2022-01-25 DIAGNOSIS — Z95828 Presence of other vascular implants and grafts: Secondary | ICD-10-CM

## 2022-01-25 DIAGNOSIS — Z5111 Encounter for antineoplastic chemotherapy: Secondary | ICD-10-CM | POA: Diagnosis present

## 2022-01-25 LAB — COMPREHENSIVE METABOLIC PANEL
ALT: 11 U/L (ref 0–44)
AST: 14 U/L — ABNORMAL LOW (ref 15–41)
Albumin: 3.1 g/dL — ABNORMAL LOW (ref 3.5–5.0)
Alkaline Phosphatase: 58 U/L (ref 38–126)
Anion gap: 6 (ref 5–15)
BUN: 18 mg/dL (ref 8–23)
CO2: 25 mmol/L (ref 22–32)
Calcium: 8.6 mg/dL — ABNORMAL LOW (ref 8.9–10.3)
Chloride: 108 mmol/L (ref 98–111)
Creatinine, Ser: 1.08 mg/dL (ref 0.61–1.24)
GFR, Estimated: >60 mL/min (ref >60)
Glucose, Bld: 99 mg/dL (ref 70–99)
Potassium: 3.8 mmol/L (ref 3.5–5.1)
Sodium: 139 mmol/L (ref 135–145)
Total Bilirubin: 0.7 mg/dL (ref 0.3–1.2)
Total Protein: 6.5 g/dL (ref 6.5–8.1)

## 2022-01-25 LAB — CBC WITH DIFFERENTIAL/PLATELET
Abs Immature Granulocytes: 0.6 10*3/uL — ABNORMAL HIGH (ref 0.00–0.07)
Basophils Absolute: 0 10*3/uL (ref 0.0–0.1)
Basophils Relative: 0 %
Eosinophils Absolute: 0.2 10*3/uL (ref 0.0–0.5)
Eosinophils Relative: 3 %
HCT: 21.1 % — ABNORMAL LOW (ref 39.0–52.0)
Hemoglobin: 6.8 g/dL — CL (ref 13.0–17.0)
Lymphocytes Relative: 50 %
Lymphs Abs: 3.2 10*3/uL (ref 0.7–4.0)
MCH: 31.2 pg (ref 26.0–34.0)
MCHC: 32.2 g/dL (ref 30.0–36.0)
MCV: 96.8 fL (ref 80.0–100.0)
Metamyelocytes Relative: 4 %
Monocytes Absolute: 0.8 10*3/uL (ref 0.1–1.0)
Monocytes Relative: 12 %
Myelocytes: 4 %
Neutro Abs: 1.6 10*3/uL — ABNORMAL LOW (ref 1.7–7.7)
Neutrophils Relative %: 26 %
Platelets: 51 10*3/uL — ABNORMAL LOW (ref 150–400)
Promyelocytes Relative: 1 %
RBC: 2.18 MIL/uL — ABNORMAL LOW (ref 4.22–5.81)
RDW: 16.3 % — ABNORMAL HIGH (ref 11.5–15.5)
WBC: 6.3 10*3/uL (ref 4.0–10.5)
nRBC: 4.7 % — ABNORMAL HIGH (ref 0.0–0.2)
nRBC: 5 /100 WBC — ABNORMAL HIGH

## 2022-01-25 LAB — LACTATE DEHYDROGENASE: LDH: 178 U/L (ref 98–192)

## 2022-01-25 LAB — MAGNESIUM: Magnesium: 2.1 mg/dL (ref 1.7–2.4)

## 2022-01-25 LAB — SAMPLE TO BLOOD BANK

## 2022-01-25 LAB — PREPARE RBC (CROSSMATCH)

## 2022-01-25 MED ORDER — SODIUM CHLORIDE 0.9 % IV SOLN
10.0000 mg | Freq: Once | INTRAVENOUS | Status: AC
  Administered 2022-01-25: 10 mg via INTRAVENOUS
  Filled 2022-01-25: qty 10

## 2022-01-25 MED ORDER — SODIUM CHLORIDE 0.9% FLUSH
10.0000 mL | INTRAVENOUS | Status: DC | PRN
  Administered 2022-01-25: 10 mL

## 2022-01-25 MED ORDER — PALONOSETRON HCL INJECTION 0.25 MG/5ML
0.2500 mg | Freq: Once | INTRAVENOUS | Status: AC
  Administered 2022-01-25: 0.25 mg via INTRAVENOUS
  Filled 2022-01-25: qty 5

## 2022-01-25 MED ORDER — SODIUM CHLORIDE 0.9 % IV SOLN
50.0000 mg/m2 | Freq: Once | INTRAVENOUS | Status: AC
  Administered 2022-01-25: 95 mg via INTRAVENOUS
  Filled 2022-01-25: qty 9.5

## 2022-01-25 MED ORDER — HEPARIN SOD (PORK) LOCK FLUSH 100 UNIT/ML IV SOLN
500.0000 [IU] | Freq: Once | INTRAVENOUS | Status: AC | PRN
  Administered 2022-01-25: 500 [IU]

## 2022-01-25 MED ORDER — MEGESTROL ACETATE 400 MG/10ML PO SUSP
400.0000 mg | Freq: Two times a day (BID) | ORAL | 3 refills | Status: DC
Start: 2022-01-25 — End: 2023-11-12

## 2022-01-25 MED ORDER — SODIUM CHLORIDE 0.9 % IV SOLN: Freq: Once | INTRAVENOUS | Status: AC

## 2022-01-25 NOTE — Patient Instructions (Addendum)
[facility] Discharge Instructions   You were seen and examined today by Dr. Katragadda.  We sent you a prescription to help stimulate your appetite. It is called Megace. It is a liquid - you take 2 teaspoons twice a day. Continue drinking Boost or Ensure to help with your nutrition.   We will proceed with your treatment today if your lab work is stable.  We will arrange for you to have a bone marrow biopsy after this cycle of treatment.   Return as scheduled.    Thank you for choosing [facility] at [facility] to provide your oncology and hematology care.  To afford each patient quality time with our provider, please arrive at least 15 minutes before your scheduled appointment time.   If you have a lab appointment with the Cancer Center please come in thru the Main Entrance and check in at the main information desk.  You need to re-schedule your appointment should you arrive 10 or more minutes late.  We strive to give you quality time with our providers, and arriving late affects you and other patients whose appointments are after yours.  Also, if you no show three or more times for appointments you may be dismissed from the clinic at the providers discretion.     Again, thank you for choosing [facility].  Our hope is that these requests will decrease the amount of time that you wait before being seen by our physicians.       _____________________________________________________________  Should you have questions after your visit to [facility], please contact our office at [phone] and follow the prompts.  Our office hours are 8:00 a.m. and 4:30 p.m. Monday - Friday.  Please note that voicemails left after 4:00 p.m. may not be returned until the following business day.  We are closed weekends and major holidays.  You do have access to a nurse 24-7, just call the main number to the clinic [phone]  and do not press any options, hold on the line and a nurse will answer the phone.    For prescription refill requests, have your pharmacy contact our office and allow 72 hours.    Due to Covid, you will need to wear a mask upon entering the hospital. If you do not have a mask, a mask will be given to you at the Main Entrance upon arrival. For doctor visits, patients may have 1 support person age 18 or older with them. For treatment visits, patients can not have anyone with them due to social distancing guidelines and our immunocompromised population.      

## 2022-01-25 NOTE — Progress Notes (Signed)
CRITICAL VALUE ALERT Critical value received:  HGB 6.8. Date of notification:  01-25-2022 Time of notification: 11:16 am.  Critical value read back:  Yes.   Nurse who received alert:  B. Khelani Kops Rn MD notified time and response:  Katragadda.   Message received from A. Beckie Salts. Infuse 1 unit of blood on 01-26-2022. Orders in and blood bank aware.

## 2022-01-25 NOTE — Progress Notes (Signed)
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  INTERVAL HISTORY:  Dustin Tucker, a 88 y.o. male, returns for routine follow-up and consideration for next cycle of chemotherapy. Dustin Tucker was last seen on 12/21/2021.  Due for cycle #4 of Azacitidine today.   Overall, Dustin Tucker tells me Dustin Tucker has been feeling pretty well. Dustin Tucker denies n/v/d, hematuria, hematochezia, infections, and fevers. Dustin Tucker reports poor taste since start of treatment, and his appetite is poor. Dustin Tucker has lost 14 lbs since 3/13. Dustin Tucker reports constipation. Dustin Tucker reports Dustin Tucker is active at home. Dustin Tucker drinks 2-3 bottles of Ensure daily.   Overall, Dustin Tucker feels ready for next cycle of chemo today.    REVIEW OF SYSTEMS:  Review of Systems  Constitutional:  Positive for fatigue. Negative for appetite change.  Respiratory:  Positive for shortness of breath.   Gastrointestinal:  Positive for constipation. Negative for diarrhea, nausea and vomiting.  All other systems reviewed and are negative.   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   Hypertension    Port-A-Cath in place 10/15/2021   Past Surgical  History:  Procedure Laterality Date   CARPAL TUNNEL RELEASE Right    COLONOSCOPY N/A 09/19/2013   Procedure: COLONOSCOPY;  Surgeon: Rogene Houston, MD;  Location: [facility];  Service: Endoscopy;  Laterality: N/A;  830   FINGER AMPUTATION Left    4th and 5th   left eye blindness Left    PORTACATH PLACEMENT Right 10/16/2021   Procedure: INSERTION PORT-A-CATH;  Surgeon: Aviva Signs, MD;  Location: [facility] ORS;  Service: General;  Laterality: Right;   SVT ABLATION N/A 09/21/2018   Procedure: SVT ABLATION;  Surgeon: Constance Haw, MD;  Location: [facility];  Service: Cardiovascular;  Laterality: N/A;    SOCIAL HISTORY:  Social History   Socioeconomic History   Marital status: Widowed    Spouse name: Not on file   Number of children: Not on file   Years of education: Not on file   Highest education level: Not on file  Occupational History   Not on file  Tobacco Use   Smoking status: Former    Packs/day: 0.50    Types: Cigarettes    Start date: 08/09/1956    Quit date: 08/10/1979    Years since quitting: 42.4   Smokeless tobacco: Never  Vaping Use   Vaping Use: Never used  Substance and Sexual Activity   Alcohol use: No    Alcohol/week: 0.0 standard drinks of alcohol   Drug use: No   Sexual activity: Not on file  Other Topics Concern   Not on file  Social History Narrative   Not on file   Social Determinants of Health   Financial Resource Strain: Not on file  Food Insecurity: Not on file  Transportation Needs: Not on file  Physical Activity: Not on file  Stress: Not on file  Social Connections: Not on file  Intimate Partner Violence: Not on file    FAMILY HISTORY:  Family History  Problem Relation Age of Onset   CAD Mother    CAD Father    Cancer Brother     CURRENT MEDICATIONS:  Current Outpatient Medications  Medication Sig Dispense Refill   albuterol (VENTOLIN HFA) 108 (90 Base) MCG/ACT inhaler Inhale 2 puffs into the lungs every 6 (six) hours  as needed for wheezing or shortness of breath.     alfuzosin (UROXATRAL) 10 MG 24 hr tablet Take 1 tablet (10 mg total) by mouth daily with breakfast. 30 tablet 11   azaCITIDine 5 mg/2 mLs in lactated ringers infusion Inject 75 mg/m2 into the vein daily. Days 1-5 every 28 days     cholecalciferol (VITAMIN D) 1000 units tablet Take 1,000 Units by mouth daily.     cloNIDine (CATAPRES) 0.1 MG tablet Take 0.1 mg by mouth daily.     docusate sodium (COLACE) 100 MG capsule Take 1 capsule (100 mg total) by mouth 2 (two) times daily. 60 capsule 1   hydrochlorothiazide (HYDRODIURIL) 25 MG tablet Take 25 mg by mouth daily.     levocetirizine (XYZAL) 5 MG tablet Take 5 mg by mouth daily.     lidocaine-prilocaine (EMLA) cream Apply a dime-sized amount to port a cath site (do not rub in) and cover with plastic wrap one hour prior to infusion appointments 30 g 3   lisinopril (PRINIVIL,ZESTRIL) 40 MG tablet Take 40 mg by mouth daily.     pantoprazole (PROTONIX) 40 MG tablet Take 1 tablet (40 mg total) by mouth daily. 30 tablet 1   prochlorperazine (COMPAZINE) 10 MG tablet TAKE (1) TABLET BY MOUTH EVERY SIX HOURS AS NEEDED. 60 tablet 3   vitamin B-12 (CYANOCOBALAMIN) 1000 MCG tablet Take 1,000 mcg by mouth daily.     No current facility-administered medications for this visit.   Facility-Administered Medications Ordered in Other Visits  Medication Dose Route Frequency Provider Last Rate Last Admin   0.9 %  sodium chloride infusion (Manually program via Guardrails IV Fluids)  250 mL Intravenous Once Derek Jack, MD        ALLERGIES:  Allergies  Allergen Reactions   Aspirin Other (See Comments)    Gi upset, can tolerate baby aspirin   Flomax [Tamsulosin] Nausea Only    PHYSICAL EXAM:  Performance status (ECOG): 1 - Symptomatic but completely ambulatory  There were no vitals filed for this visit. Wt Readings from Last 3 Encounters:  12/21/21 152 lb 12.8 oz (69.3 kg)  12/21/21 152 lb 12.8 oz  (69.3 kg)  12/17/21 154 lb (69.9 kg)   Physical Exam Vitals reviewed.  Constitutional:      Appearance: Normal appearance.  Cardiovascular:     Rate and Rhythm: Normal rate and regular rhythm.     Pulses: Normal pulses.     Heart sounds: Normal heart sounds.  Pulmonary:     Effort: Pulmonary effort is normal.     Breath sounds: Normal breath sounds.  Neurological:     General: No focal deficit present.     Mental Status: Dustin Tucker is alert and oriented to person, place, and time.  Psychiatric:  Mood and Affect: Mood normal.        Behavior: Behavior normal.     LABORATORY DATA:  I have reviewed the labs as listed.     Latest Ref Rng & Units 01/06/2022    9:12 AM 12/21/2021    9:36 AM 12/14/2021   10:01 AM  CBC  WBC 4.0 - 10.5 K/uL 5.4  9.9  6.1   Hemoglobin 13.0 - 17.0 g/dL 6.3  7.3  7.0   Hematocrit 39.0 - 52.0 % 19.6  22.7  21.0   Platelets 150 - 400 K/uL 40  39  40       Latest Ref Rng & Units 01/06/2022    9:12 AM 12/21/2021    9:36 AM 12/17/2021    8:12 AM  CMP  Glucose 70 - 99 mg/dL 93  103  116   BUN 8 - 23 mg/dL 21  23  32   Creatinine 0.61 - 1.24 mg/dL 1.08  1.16  1.06   Sodium 135 - 145 mmol/L 137  136  138   Potassium 3.5 - 5.1 mmol/L 3.8  4.0  3.9   Chloride 98 - 111 mmol/L 109  107  109   CO2 22 - 32 mmol/L _0 Calcium 8.9 - 10.3 mg/dL 8.5  8.6  8.5   Total Protein 6.5 - 8.1 g/dL 6.0  5.8  6.2   Total Bilirubin 0.3 - 1.2 mg/dL 1.0  0.6  0.4   Alkaline Phos 38 - 126 U/L 48  49  52   AST 15 - 41 U/L _1 ALT 0 - 44 U/L _2 DIAGNOSTIC IMAGING:  I have independently reviewed the scans and discussed with the patient. No results found.   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  Hemoglobin 15.5 on 09/20/2018.  Platelet count on 09/20/1998 2155. - Dustin Tucker was on aspirin 81 mg at  presentation.  Dustin Tucker received 3 units PRBC.  Ferritin was 244 and percent saturation 37.  S82 was 707 and folic acid normal.  Creatinine was normal. - MMA, copper, folic acid and LDH was normal.  SPEP negative. - Serum EPO 140.  Kappa light chains elevated at 32.4 with ratio 1.55. - Bone marrow biopsy on 09/18/2021: Hypercellular marrow with trilineage dyspoiesis and monocytosis.  Dyspoiesis in the granulocytic precursors without apparent late maturation of neutrophils, increased monocytes but no significant blast population.  Increased megakaryocytes with widely separated nuclear lobes.  Erythroid precursors slightly decreased in number with the type ER.  Ring sideroblasts not identified. - Chromosome analysis and MDS FISH panel was normal. 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 - Based on [facility] molecular model, Dustin Tucker has 3 points with circulating immature cells, decreased hemoglobin, decreased platelets.  This puts him in high risk, with overall survival 16 months. - Azacitidine started on 10/19/2021.    Social/family history: - Lives at home by himself.  Son lives in [place].  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  No exposure to chemicals.  Non-smoker. - Brother had colon cancer.  Another brother had?  Stomach cancer.  2 maternal uncles had cancer, type unknown to the patient.   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 - Dustin Tucker  required 3 units PRBC after cycle 3. - Dustin Tucker did not have any major side effects after last cycle except some constipation. - Reviewed labs today which showed white count was normal with ANC of 1.6.  Platelet count is 51,000.  Hemoglobin is 6.8. - Dustin Tucker will receive 1 unit PRBC today. - Proceed with cycle 4 azacitidine at 50 mg/m2 x7 days. - RTC 5 weeks for follow-up with repeat labs.  We will schedule bone marrow aspiration and biopsy, cytogenetics and MDS FISH panel prior to next visit.  2.  Weight loss: - Dustin Tucker lost  about 15 pounds since the start of azacitidine on 10/19/2021. - Dustin Tucker reports altered taste and decreased appetite. - Dustin Tucker is drinking Ensure 2 to 3 cans/day. - We will start him on Megace 400 mg twice daily.    Orders placed this encounter:  No orders of the defined types were placed in this encounter.    Derek Jack, MD [facility] [phone]   I, Thana Ates, am acting as a scribe for Dr. Derek Jack.  I, Derek Jack MD, have reviewed the above documentation for accuracy and completeness, and I agree with the above.

## 2022-01-25 NOTE — Progress Notes (Signed)
Nutrition Follow-up:  Patient with chronic myelomonocytic leukemia. His currently receiving azacitidine q28d.   Met with patient in infusion. He says his appetite "is terrible." Patient reports nothing taste good to him. He has not been doing baking soda salt water rinses recently. This was previously helpful to him. Patient continues to drink 2 Ensure Complete. Sometimes these do not taste good either. Patient agreeable to drinking vanilla Ensure Complete today during infusion.    Medications: megace   Labs: Hgb 6.8  Anthropometrics: Weight 146 lb 8 oz today decreased 3.9% (6 lbs) in 5 weeks; insignificant for time frame however concerning for worsening nutrition status   5/15 - 152 lb 12.8 oz    NUTRITION DIAGNOSIS: Moderate malnutrition continues   INTERVENTION:  Reviewed tips for altered taste, pt will resume baking soda salt water rinses several times daily before meals - additional copy of handout + recipe provided Appetite stimulant (megace) ordered today by provider Encouraged high calorie high protein foods to promote weight gain Continue drinking Ensure Complete/equivalent, recommend increasing to 3/day - pt declined coupons     MONITORING, EVALUATION, GOAL: weight trends, intake    NEXT VISIT: Monday July 24 during infusion

## 2022-01-26 ENCOUNTER — Inpatient Hospital Stay (HOSPITAL_COMMUNITY): Payer: Medicare HMO

## 2022-01-26 VITALS — BP 105/58 | HR 68 | Temp 97.8°F | Resp 17

## 2022-01-26 DIAGNOSIS — D469 Myelodysplastic syndrome, unspecified: Secondary | ICD-10-CM

## 2022-01-26 DIAGNOSIS — C9310 Chronic myelomonocytic leukemia not having achieved remission: Secondary | ICD-10-CM

## 2022-01-26 DIAGNOSIS — Z5111 Encounter for antineoplastic chemotherapy: Secondary | ICD-10-CM | POA: Diagnosis not present

## 2022-01-26 DIAGNOSIS — Z95828 Presence of other vascular implants and grafts: Secondary | ICD-10-CM

## 2022-01-26 MED ORDER — SODIUM CHLORIDE 0.9% IV SOLUTION: 250.0000 mL | Freq: Once | INTRAVENOUS | Status: DC

## 2022-01-26 MED ORDER — SODIUM CHLORIDE 0.9 % IV SOLN
10.0000 mg | Freq: Once | INTRAVENOUS | Status: AC
  Administered 2022-01-26: 10 mg via INTRAVENOUS
  Filled 2022-01-26: qty 10

## 2022-01-26 MED ORDER — ACETAMINOPHEN 325 MG PO TABS
650.0000 mg | ORAL_TABLET | Freq: Once | ORAL | Status: AC
  Administered 2022-01-26: 650 mg via ORAL
  Filled 2022-01-26: qty 2

## 2022-01-26 MED ORDER — SODIUM CHLORIDE 0.9% FLUSH: 10.0000 mL | INTRAVENOUS | Status: DC | PRN

## 2022-01-26 MED ORDER — SODIUM CHLORIDE 0.9 % IV SOLN: Freq: Once | INTRAVENOUS | Status: AC

## 2022-01-26 MED ORDER — HEPARIN SOD (PORK) LOCK FLUSH 100 UNIT/ML IV SOLN: 500.0000 [IU] | Freq: Every day | INTRAVENOUS | Status: DC | PRN

## 2022-01-26 MED ORDER — SODIUM CHLORIDE 0.9% FLUSH
10.0000 mL | INTRAVENOUS | Status: DC | PRN
  Administered 2022-01-26: 10 mL

## 2022-01-26 MED ORDER — HEPARIN SOD (PORK) LOCK FLUSH 100 UNIT/ML IV SOLN
500.0000 [IU] | Freq: Once | INTRAVENOUS | Status: AC | PRN
  Administered 2022-01-26: 500 [IU]

## 2022-01-26 MED ORDER — SODIUM CHLORIDE 0.9 % IV SOLN
50.0000 mg/m2 | Freq: Once | INTRAVENOUS | Status: AC
  Administered 2022-01-26: 95 mg via INTRAVENOUS
  Filled 2022-01-26: qty 9.5

## 2022-01-26 MED ORDER — DIPHENHYDRAMINE HCL 25 MG PO CAPS
25.0000 mg | ORAL_CAPSULE | Freq: Once | ORAL | Status: AC
  Administered 2022-01-26: 25 mg via ORAL
  Filled 2022-01-26: qty 1

## 2022-01-27 ENCOUNTER — Inpatient Hospital Stay (HOSPITAL_COMMUNITY): Payer: Medicare HMO

## 2022-01-27 VITALS — BP 113/48 | HR 76 | Temp 97.3°F | Resp 18

## 2022-01-27 DIAGNOSIS — Z5111 Encounter for antineoplastic chemotherapy: Secondary | ICD-10-CM | POA: Diagnosis not present

## 2022-01-27 DIAGNOSIS — C9310 Chronic myelomonocytic leukemia not having achieved remission: Secondary | ICD-10-CM

## 2022-01-27 DIAGNOSIS — Z95828 Presence of other vascular implants and grafts: Secondary | ICD-10-CM

## 2022-01-27 LAB — TYPE AND SCREEN
ABO/RH(D): B POS
Antibody Screen: NEGATIVE
Unit division: 0

## 2022-01-27 LAB — BPAM RBC
Blood Product Expiration Date: 202307112359
ISSUE DATE / TIME: 202306201139
Unit Type and Rh: 1700

## 2022-01-27 MED ORDER — PALONOSETRON HCL INJECTION 0.25 MG/5ML
0.2500 mg | Freq: Once | INTRAVENOUS | Status: AC
  Administered 2022-01-27: 0.25 mg via INTRAVENOUS
  Filled 2022-01-27: qty 5

## 2022-01-27 MED ORDER — HEPARIN SOD (PORK) LOCK FLUSH 100 UNIT/ML IV SOLN
500.0000 [IU] | Freq: Once | INTRAVENOUS | Status: AC | PRN
  Administered 2022-01-27: 500 [IU]

## 2022-01-27 MED ORDER — SODIUM CHLORIDE 0.9% FLUSH
10.0000 mL | INTRAVENOUS | Status: DC | PRN
  Administered 2022-01-27: 10 mL

## 2022-01-27 MED ORDER — SODIUM CHLORIDE 0.9 % IV SOLN
50.0000 mg/m2 | Freq: Once | INTRAVENOUS | Status: AC
  Administered 2022-01-27: 95 mg via INTRAVENOUS
  Filled 2022-01-27: qty 9.5

## 2022-01-27 MED ORDER — SODIUM CHLORIDE 0.9 % IV SOLN: Freq: Once | INTRAVENOUS | Status: AC

## 2022-01-27 MED ORDER — SODIUM CHLORIDE 0.9 % IV SOLN
10.0000 mg | Freq: Once | INTRAVENOUS | Status: AC
  Administered 2022-01-27: 10 mg via INTRAVENOUS
  Filled 2022-01-27: qty 10

## 2022-01-27 NOTE — Patient Instructions (Signed)
[facility]  Discharge Instructions: Thank you for choosing [facility] to provide your oncology and hematology care.  If you have a lab appointment with the Cancer Center, please come in thru the Main Entrance and check in at the main information desk.  Wear comfortable clothing and clothing appropriate for easy access to any Portacath or PICC line.   We strive to give you quality time with your provider. You may need to reschedule your appointment if you arrive late (15 or more minutes).  Arriving late affects you and other patients whose appointments are after yours.  Also, if you miss three or more appointments without notifying the office, you may be dismissed from the clinic at the provider's discretion.      For prescription refill requests, have your pharmacy contact our office and allow 72 hours for refills to be completed.         To help prevent nausea and vomiting after your treatment, we encourage you to take your nausea medication as directed.  BELOW ARE SYMPTOMS THAT SHOULD BE REPORTED IMMEDIATELY: *FEVER GREATER THAN 100.4 F (38 C) OR HIGHER *CHILLS OR SWEATING *NAUSEA AND VOMITING THAT IS NOT CONTROLLED WITH YOUR NAUSEA MEDICATION *UNUSUAL SHORTNESS OF BREATH *UNUSUAL BRUISING OR BLEEDING *URINARY PROBLEMS (pain or burning when urinating, or frequent urination) *BOWEL PROBLEMS (unusual diarrhea, constipation, pain near the anus) TENDERNESS IN MOUTH AND THROAT WITH OR WITHOUT PRESENCE OF ULCERS (sore throat, sores in mouth, or a toothache) UNUSUAL RASH, SWELLING OR PAIN  UNUSUAL VAGINAL DISCHARGE OR ITCHING   Items with * indicate a potential emergency and should be followed up as soon as possible or go to the Emergency Department if any problems should occur.  Please show the CHEMOTHERAPY ALERT CARD or IMMUNOTHERAPY ALERT CARD at check-in to the Emergency Department and triage nurse.  Should you have questions after your visit or need to  cancel or reschedule your appointment, please contact [facility] [phone]  and follow the prompts.  Office hours are 8:00 a.m. to 4:30 p.m. Monday - Friday. Please note that voicemails left after 4:00 p.m. may not be returned until the following business day.  We are closed weekends and major holidays. You have access to a nurse at all times for urgent questions. Please call the main number to the clinic [phone] and follow the prompts.  For any non-urgent questions, you may also contact your provider using MyChart. We now offer e-Visits for anyone 18 and older to request care online for non-urgent symptoms. For details visit [email or web address].   Also download the MyChart app! Go to the app store, search "MyChart", open the app, select [facility], and log in with your MyChart username and password.  Masks are optional in the cancer centers. If you would like for your care team to wear a mask while they are taking care of you, please let them know. For doctor visits, patients may have with them one support person who is at least 86 years old. At this time, visitors are not allowed in the infusion area.  

## 2022-01-27 NOTE — Progress Notes (Signed)
Treatment given per orders. Patient tolerated it well without problems. Vitals stable and discharged home from clinic ambulatory. Follow up as scheduled.  

## 2022-01-28 ENCOUNTER — Inpatient Hospital Stay (HOSPITAL_COMMUNITY): Payer: Medicare HMO

## 2022-01-28 VITALS — BP 131/60 | HR 69 | Temp 97.8°F | Resp 18

## 2022-01-28 DIAGNOSIS — Z95828 Presence of other vascular implants and grafts: Secondary | ICD-10-CM

## 2022-01-28 DIAGNOSIS — C9310 Chronic myelomonocytic leukemia not having achieved remission: Secondary | ICD-10-CM

## 2022-01-28 DIAGNOSIS — Z5111 Encounter for antineoplastic chemotherapy: Secondary | ICD-10-CM | POA: Diagnosis not present

## 2022-01-28 MED ORDER — SODIUM CHLORIDE 0.9 % IV SOLN
50.0000 mg/m2 | Freq: Once | INTRAVENOUS | Status: AC
  Administered 2022-01-28: 95 mg via INTRAVENOUS
  Filled 2022-01-28: qty 9.5

## 2022-01-28 MED ORDER — SODIUM CHLORIDE 0.9 % IV SOLN: Freq: Once | INTRAVENOUS | Status: AC

## 2022-01-28 MED ORDER — HEPARIN SOD (PORK) LOCK FLUSH 100 UNIT/ML IV SOLN
500.0000 [IU] | Freq: Once | INTRAVENOUS | Status: AC | PRN
  Administered 2022-01-28: 500 [IU]

## 2022-01-28 MED ORDER — SODIUM CHLORIDE 0.9 % IV SOLN
10.0000 mg | Freq: Once | INTRAVENOUS | Status: AC
  Administered 2022-01-28: 10 mg via INTRAVENOUS
  Filled 2022-01-28: qty 10

## 2022-01-28 MED ORDER — SODIUM CHLORIDE 0.9% FLUSH
10.0000 mL | INTRAVENOUS | Status: DC | PRN
  Administered 2022-01-28: 10 mL

## 2022-01-28 NOTE — Patient Instructions (Signed)
[facility]  Discharge Instructions: Thank you for choosing [facility] to provide your oncology and hematology care.  If you have a lab appointment with the Cancer Center, please come in thru the Main Entrance and check in at the main information desk.  Wear comfortable clothing and clothing appropriate for easy access to any Portacath or PICC line.   We strive to give you quality time with your provider. You may need to reschedule your appointment if you arrive late (15 or more minutes).  Arriving late affects you and other patients whose appointments are after yours.  Also, if you miss three or more appointments without notifying the office, you may be dismissed from the clinic at the provider's discretion.      For prescription refill requests, have your pharmacy contact our office and allow 72 hours for refills to be completed.    Today you received the following chemotherapy and/or immunotherapy agents Vidaza      To help prevent nausea and vomiting after your treatment, we encourage you to take your nausea medication as directed.  BELOW ARE SYMPTOMS THAT SHOULD BE REPORTED IMMEDIATELY: *FEVER GREATER THAN 100.4 F (38 C) OR HIGHER *CHILLS OR SWEATING *NAUSEA AND VOMITING THAT IS NOT CONTROLLED WITH YOUR NAUSEA MEDICATION *UNUSUAL SHORTNESS OF BREATH *UNUSUAL BRUISING OR BLEEDING *URINARY PROBLEMS (pain or burning when urinating, or frequent urination) *BOWEL PROBLEMS (unusual diarrhea, constipation, pain near the anus) TENDERNESS IN MOUTH AND THROAT WITH OR WITHOUT PRESENCE OF ULCERS (sore throat, sores in mouth, or a toothache) UNUSUAL RASH, SWELLING OR PAIN  UNUSUAL VAGINAL DISCHARGE OR ITCHING   Items with * indicate a potential emergency and should be followed up as soon as possible or go to the Emergency Department if any problems should occur.  Please show the CHEMOTHERAPY ALERT CARD or IMMUNOTHERAPY ALERT CARD at check-in to the Emergency  Department and triage nurse.  Should you have questions after your visit or need to cancel or reschedule your appointment, please contact [facility] [phone]  and follow the prompts.  Office hours are 8:00 a.m. to 4:30 p.m. Monday - Friday. Please note that voicemails left after 4:00 p.m. may not be returned until the following business day.  We are closed weekends and major holidays. You have access to a nurse at all times for urgent questions. Please call the main number to the clinic [phone] and follow the prompts.  For any non-urgent questions, you may also contact your provider using MyChart. We now offer e-Visits for anyone 18 and older to request care online for non-urgent symptoms. For details visit [email or web address].   Also download the MyChart app! Go to the app store, search "MyChart", open the app, select [facility], and log in with your MyChart username and password.  Masks are optional in the cancer centers. If you would like for your care team to wear a mask while they are taking care of you, please let them know. For doctor visits, patients may have with them one support person who is at least 86 years old. At this time, visitors are not allowed in the infusion area.  

## 2022-01-28 NOTE — Progress Notes (Signed)
Pt presents today for Vidaza per provider's order. Vital signs stable and pt voiced no new complaints at this time.  Vidaza  given today per MD orders. Tolerated infusion without adverse affects. Vital signs stable. No complaints at this time. Discharged from clinic ambulatory in stable condition. Alert and oriented x 3. F/U with [facility] as scheduled.     

## 2022-01-29 ENCOUNTER — Inpatient Hospital Stay (HOSPITAL_COMMUNITY): Payer: Medicare HMO

## 2022-01-29 VITALS — BP 125/67 | HR 75 | Temp 97.8°F | Resp 18

## 2022-01-29 DIAGNOSIS — Z5111 Encounter for antineoplastic chemotherapy: Secondary | ICD-10-CM | POA: Diagnosis not present

## 2022-01-29 DIAGNOSIS — C9310 Chronic myelomonocytic leukemia not having achieved remission: Secondary | ICD-10-CM

## 2022-01-29 DIAGNOSIS — Z95828 Presence of other vascular implants and grafts: Secondary | ICD-10-CM

## 2022-01-29 MED ORDER — HEPARIN SOD (PORK) LOCK FLUSH 100 UNIT/ML IV SOLN
500.0000 [IU] | Freq: Once | INTRAVENOUS | Status: AC | PRN
  Administered 2022-01-29: 500 [IU]

## 2022-01-29 MED ORDER — SODIUM CHLORIDE 0.9 % IV SOLN
10.0000 mg | Freq: Once | INTRAVENOUS | Status: AC
  Administered 2022-01-29: 10 mg via INTRAVENOUS
  Filled 2022-01-29: qty 10

## 2022-01-29 MED ORDER — PALONOSETRON HCL INJECTION 0.25 MG/5ML
0.2500 mg | Freq: Once | INTRAVENOUS | Status: AC
  Administered 2022-01-29: 0.25 mg via INTRAVENOUS
  Filled 2022-01-29: qty 5

## 2022-01-29 MED ORDER — SODIUM CHLORIDE 0.9% FLUSH
10.0000 mL | INTRAVENOUS | Status: DC | PRN
  Administered 2022-01-29: 10 mL

## 2022-01-29 MED ORDER — SODIUM CHLORIDE 0.9 % IV SOLN
50.0000 mg/m2 | Freq: Once | INTRAVENOUS | Status: AC
  Administered 2022-01-29: 95 mg via INTRAVENOUS
  Filled 2022-01-29: qty 9.5

## 2022-01-29 MED ORDER — SODIUM CHLORIDE 0.9 % IV SOLN: Freq: Once | INTRAVENOUS | Status: AC

## 2022-02-01 ENCOUNTER — Inpatient Hospital Stay (HOSPITAL_COMMUNITY): Payer: Medicare HMO

## 2022-02-01 ENCOUNTER — Other Ambulatory Visit (HOSPITAL_COMMUNITY): Payer: Medicare HMO

## 2022-02-01 VITALS — BP 114/54 | HR 76 | Temp 98.2°F | Resp 17

## 2022-02-01 DIAGNOSIS — Z5111 Encounter for antineoplastic chemotherapy: Secondary | ICD-10-CM | POA: Diagnosis not present

## 2022-02-01 DIAGNOSIS — C9310 Chronic myelomonocytic leukemia not having achieved remission: Secondary | ICD-10-CM

## 2022-02-01 DIAGNOSIS — D469 Myelodysplastic syndrome, unspecified: Secondary | ICD-10-CM

## 2022-02-01 DIAGNOSIS — Z95828 Presence of other vascular implants and grafts: Secondary | ICD-10-CM

## 2022-02-01 LAB — SAMPLE TO BLOOD BANK

## 2022-02-01 LAB — COMPREHENSIVE METABOLIC PANEL
ALT: 11 U/L (ref 0–44)
AST: 12 U/L — ABNORMAL LOW (ref 15–41)
Albumin: 2.9 g/dL — ABNORMAL LOW (ref 3.5–5.0)
Alkaline Phosphatase: 47 U/L (ref 38–126)
Anion gap: 8 (ref 5–15)
BUN: 22 mg/dL (ref 8–23)
CO2: 22 mmol/L (ref 22–32)
Calcium: 8.4 mg/dL — ABNORMAL LOW (ref 8.9–10.3)
Chloride: 107 mmol/L (ref 98–111)
Creatinine, Ser: 1.15 mg/dL (ref 0.61–1.24)
GFR, Estimated: >60 mL/min (ref >60)
Glucose, Bld: 89 mg/dL (ref 70–99)
Potassium: 3.9 mmol/L (ref 3.5–5.1)
Sodium: 137 mmol/L (ref 135–145)
Total Bilirubin: 0.7 mg/dL (ref 0.3–1.2)
Total Protein: 5.7 g/dL — ABNORMAL LOW (ref 6.5–8.1)

## 2022-02-01 LAB — CBC WITH DIFFERENTIAL/PLATELET
Abs Immature Granulocytes: 0.1 10*3/uL — ABNORMAL HIGH (ref 0.00–0.07)
Band Neutrophils: 1 %
Basophils Absolute: 0 10*3/uL (ref 0.0–0.1)
Basophils Relative: 0 %
Eosinophils Absolute: 0.1 10*3/uL (ref 0.0–0.5)
Eosinophils Relative: 1 %
HCT: 21.3 % — ABNORMAL LOW (ref 39.0–52.0)
Hemoglobin: 7.1 g/dL — ABNORMAL LOW (ref 13.0–17.0)
Lymphocytes Relative: 33 %
Lymphs Abs: 4.3 10*3/uL — ABNORMAL HIGH (ref 0.7–4.0)
MCH: 31 pg (ref 26.0–34.0)
MCHC: 33.3 g/dL (ref 30.0–36.0)
MCV: 93 fL (ref 80.0–100.0)
Metamyelocytes Relative: 1 %
Monocytes Absolute: 5.5 10*3/uL — ABNORMAL HIGH (ref 0.1–1.0)
Monocytes Relative: 42 %
Neutro Abs: 3 10*3/uL (ref 1.7–7.7)
Neutrophils Relative %: 22 %
RBC: 2.29 MIL/uL — ABNORMAL LOW (ref 4.22–5.81)
RDW: 16.9 % — ABNORMAL HIGH (ref 11.5–15.5)
Smear Review: UNDETERMINED
WBC: 13 10*3/uL — ABNORMAL HIGH (ref 4.0–10.5)
nRBC: 3.3 % — ABNORMAL HIGH (ref 0.0–0.2)

## 2022-02-01 LAB — MAGNESIUM: Magnesium: 2 mg/dL (ref 1.7–2.4)

## 2022-02-01 MED ORDER — HEPARIN SOD (PORK) LOCK FLUSH 100 UNIT/ML IV SOLN
500.0000 [IU] | Freq: Once | INTRAVENOUS | Status: AC | PRN
  Administered 2022-02-01: 500 [IU]

## 2022-02-01 MED ORDER — SODIUM CHLORIDE 0.9 % IV SOLN: Freq: Once | INTRAVENOUS | Status: AC

## 2022-02-01 MED ORDER — PALONOSETRON HCL INJECTION 0.25 MG/5ML
0.2500 mg | Freq: Once | INTRAVENOUS | Status: AC
  Administered 2022-02-01: 0.25 mg via INTRAVENOUS
  Filled 2022-02-01: qty 5

## 2022-02-01 MED ORDER — SODIUM CHLORIDE 0.9 % IV SOLN
50.0000 mg/m2 | Freq: Once | INTRAVENOUS | Status: AC
  Administered 2022-02-01: 95 mg via INTRAVENOUS
  Filled 2022-02-01: qty 9.5

## 2022-02-01 MED ORDER — SODIUM CHLORIDE 0.9 % IV SOLN
10.0000 mg | Freq: Once | INTRAVENOUS | Status: AC
  Administered 2022-02-01: 10 mg via INTRAVENOUS
  Filled 2022-02-01: qty 10

## 2022-02-01 MED ORDER — SODIUM CHLORIDE 0.9% FLUSH
10.0000 mL | INTRAVENOUS | Status: DC | PRN
  Administered 2022-02-01: 10 mL

## 2022-02-01 NOTE — Progress Notes (Signed)
Patients port flushed without difficulty.  Good blood return noted with no bruising or swelling noted at site.  Stable during access and blood draw.  Patient to remain accessed for treatment. 

## 2022-02-02 ENCOUNTER — Inpatient Hospital Stay (HOSPITAL_COMMUNITY): Payer: Medicare HMO

## 2022-02-02 VITALS — BP 125/65 | HR 86 | Temp 98.6°F | Resp 18

## 2022-02-02 DIAGNOSIS — C9310 Chronic myelomonocytic leukemia not having achieved remission: Secondary | ICD-10-CM

## 2022-02-02 DIAGNOSIS — Z95828 Presence of other vascular implants and grafts: Secondary | ICD-10-CM

## 2022-02-02 DIAGNOSIS — Z5111 Encounter for antineoplastic chemotherapy: Secondary | ICD-10-CM | POA: Diagnosis not present

## 2022-02-02 MED ORDER — SODIUM CHLORIDE 0.9 % IV SOLN: Freq: Once | INTRAVENOUS | Status: AC

## 2022-02-02 MED ORDER — SODIUM CHLORIDE 0.9% FLUSH
10.0000 mL | INTRAVENOUS | Status: DC | PRN
  Administered 2022-02-02: 10 mL

## 2022-02-02 MED ORDER — SODIUM CHLORIDE 0.9 % IV SOLN
50.0000 mg/m2 | Freq: Once | INTRAVENOUS | Status: AC
  Administered 2022-02-02: 95 mg via INTRAVENOUS
  Filled 2022-02-02: qty 9.5

## 2022-02-02 MED ORDER — SODIUM CHLORIDE 0.9 % IV SOLN
10.0000 mg | Freq: Once | INTRAVENOUS | Status: AC
  Administered 2022-02-02: 10 mg via INTRAVENOUS
  Filled 2022-02-02: qty 10

## 2022-02-02 MED ORDER — HEPARIN SOD (PORK) LOCK FLUSH 100 UNIT/ML IV SOLN
500.0000 [IU] | Freq: Once | INTRAVENOUS | Status: AC | PRN
  Administered 2022-02-02: 500 [IU]

## 2022-02-02 NOTE — Progress Notes (Signed)
Pt presents today for D7 Vidaza per provider's order. Vital signs and pt voiced no new complaints at this time.  Vidaza D7 given today per MD orders. Tolerated infusion without adverse affects. Vital signs stable. No complaints at this time. Discharged from clinic ambulatory in stable condition. Alert and oriented x 3. F/U with [facility] as scheduled.

## 2022-02-12 ENCOUNTER — Other Ambulatory Visit: Payer: Self-pay

## 2022-02-12 ENCOUNTER — Emergency Department (HOSPITAL_COMMUNITY)
Admission: EM | Admit: 2022-02-12 | Discharge: 2022-02-13 | Disposition: A | Discharge status: Home / Self Care | Payer: Medicare HMO | Attending: Student | Admitting: Student

## 2022-02-12 ENCOUNTER — Encounter (HOSPITAL_COMMUNITY): Payer: Self-pay

## 2022-02-12 DIAGNOSIS — R42 Dizziness and giddiness: Secondary | ICD-10-CM | POA: Insufficient documentation

## 2022-02-12 DIAGNOSIS — I480 Paroxysmal atrial fibrillation: Secondary | ICD-10-CM | POA: Insufficient documentation

## 2022-02-12 DIAGNOSIS — R0602 Shortness of breath: Secondary | ICD-10-CM | POA: Diagnosis not present

## 2022-02-12 DIAGNOSIS — Z79899 Other long term (current) drug therapy: Secondary | ICD-10-CM | POA: Diagnosis not present

## 2022-02-12 DIAGNOSIS — D649 Anemia, unspecified: Secondary | ICD-10-CM | POA: Insufficient documentation

## 2022-02-12 DIAGNOSIS — I10 Essential (primary) hypertension: Secondary | ICD-10-CM | POA: Diagnosis not present

## 2022-02-12 DIAGNOSIS — Z87891 Personal history of nicotine dependence: Secondary | ICD-10-CM | POA: Insufficient documentation

## 2022-02-12 DIAGNOSIS — R531 Weakness: Secondary | ICD-10-CM | POA: Diagnosis present

## 2022-02-12 LAB — COMPREHENSIVE METABOLIC PANEL
ALT: 9 U/L (ref 0–44)
AST: 10 U/L — ABNORMAL LOW (ref 15–41)
Albumin: 2.7 g/dL — ABNORMAL LOW (ref 3.5–5.0)
Alkaline Phosphatase: 46 U/L (ref 38–126)
Anion gap: 7 (ref 5–15)
BUN: 30 mg/dL — ABNORMAL HIGH (ref 8–23)
CO2: 19 mmol/L — ABNORMAL LOW (ref 22–32)
Calcium: 8.3 mg/dL — ABNORMAL LOW (ref 8.9–10.3)
Chloride: 113 mmol/L — ABNORMAL HIGH (ref 98–111)
Creatinine, Ser: 1.38 mg/dL — ABNORMAL HIGH (ref 0.61–1.24)
GFR, Estimated: 49 mL/min — ABNORMAL LOW (ref >60)
Glucose, Bld: 135 mg/dL — ABNORMAL HIGH (ref 70–99)
Potassium: 4.1 mmol/L (ref 3.5–5.1)
Sodium: 139 mmol/L (ref 135–145)
Total Bilirubin: 0.2 mg/dL — ABNORMAL LOW (ref 0.3–1.2)
Total Protein: 6.1 g/dL — ABNORMAL LOW (ref 6.5–8.1)

## 2022-02-12 LAB — CBC WITH DIFFERENTIAL/PLATELET
Abs Immature Granulocytes: 1.11 10*3/uL — ABNORMAL HIGH (ref 0.00–0.07)
Basophils Absolute: 0 10*3/uL (ref 0.0–0.1)
Basophils Relative: 0 %
Eosinophils Absolute: 0 10*3/uL (ref 0.0–0.5)
Eosinophils Relative: 0 %
HCT: 16.8 % — ABNORMAL LOW (ref 39.0–52.0)
Hemoglobin: 5.4 g/dL — CL (ref 13.0–17.0)
Immature Granulocytes: 9 %
Lymphocytes Relative: 14 %
Lymphs Abs: 1.7 10*3/uL (ref 0.7–4.0)
MCH: 30.7 pg (ref 26.0–34.0)
MCHC: 32.1 g/dL (ref 30.0–36.0)
MCV: 95.5 fL (ref 80.0–100.0)
Monocytes Absolute: 2.5 10*3/uL — ABNORMAL HIGH (ref 0.1–1.0)
Monocytes Relative: 20 %
Neutro Abs: 6.8 10*3/uL (ref 1.7–7.7)
Neutrophils Relative %: 57 %
Platelets: 52 10*3/uL — ABNORMAL LOW (ref 150–400)
RBC: 1.76 MIL/uL — ABNORMAL LOW (ref 4.22–5.81)
RDW: 17.2 % — ABNORMAL HIGH (ref 11.5–15.5)
WBC: 12 10*3/uL — ABNORMAL HIGH (ref 4.0–10.5)
nRBC: 0.3 % — ABNORMAL HIGH (ref 0.0–0.2)

## 2022-02-12 MED ORDER — LACTATED RINGERS IV BOLUS
1000.0000 mL | Freq: Once | INTRAVENOUS | Status: AC
  Administered 2022-02-12: 1000 mL via INTRAVENOUS

## 2022-02-12 MED ORDER — SODIUM CHLORIDE 0.9% IV SOLUTION: Freq: Once | INTRAVENOUS | Status: AC

## 2022-02-12 NOTE — ED Provider Notes (Signed)
[facility] EMERGENCY DEPARTMENT Provider Note  CSN: [id number] Arrival date & time: 02/12/22 2020  Chief Complaint(s) Dizziness and Weakness  HPI Dustin Tucker is a 88 y.o. 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  States that for the last 2 days he has been feeling progressively more weak and states that when he goes from sitting to standing he has significant orthostatic presyncope.  He denies associated chest pain, abdominal pain, nausea, vomiting, dark stools or other systemic symptoms.  He does endorse shortness of breath on exertion.  Of note, patient currently follows with Dr. Delton Coombes and is receiving frequent blood transfusions through his port.   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   Hypertension    Port-A-Cath in place 10/15/2021   Patient Active Problem List   Diagnosis Date Noted   Myelodysplastic syndrome ([place])    Port-A-Cath in place 10/15/2021   CMML (chronic myelomonocytic leukemia) ([place]) 10/01/2021   Chronic myelomonocytic leukemia not having achieved remission ([place]) 10/01/2021   Acute anemia 08/06/2021   Tachy-brady syndrome ([place]) 09/20/2018   Nausea and vomiting 09/20/2018   Essential hypertension 09/20/2018   Atrial fibrillation with rapid ventricular response (H[facility])    PSVT (paroxysmal supraventricular tachycardia) ([place]) 03/24/2015   Near syncope 12/13/2014   Home Medication(s) Prior to Admission medications   Medication Sig Start Date End Date Taking? Authorizing Provider  albuterol (VENTOLIN HFA) 108 (90 Base) MCG/ACT inhaler Inhale 2 puffs into the lungs every 6 (six) hours as needed for wheezing or shortness of breath. 07/28/21   [provider]  alfuzosin (UROXATRAL) 10 MG 24 hr tablet Take 1 tablet (10 mg total) by  mouth daily with breakfast. 12/08/21   Franchot Gallo, MD  azaCITIDine 5 mg/2 mLs in lactated ringers infusion Inject 75 mg/m2 into the vein daily. Days 1-5 every 28 days 10/19/21   [provider]  cholecalciferol (VITAMIN D) 1000 units tablet Take 1,000 Units by mouth daily.    [provider]  cloNIDine (CATAPRES) 0.1 MG tablet Take 0.1 mg by mouth daily. 05/16/18   [provider]  docusate sodium (COLACE) 100 MG capsule Take 1 capsule (100 mg total) by mouth 2 (two) times daily. 12/01/21   Derek Jack, MD  hydrochlorothiazide (HYDRODIURIL) 25 MG tablet Take 25 mg by mouth daily. 06/25/16   [provider]  levocetirizine (XYZAL) 5 MG tablet Take 5 mg by mouth daily. 07/28/21   [provider]  lidocaine-prilocaine (EMLA) cream Apply a dime-sized amount to port a cath site (do not rub in) and cover with plastic wrap one hour prior to infusion appointments 10/15/21   Derek Jack, MD  lisinopril (PRINIVIL,ZESTRIL) 40 MG tablet Take 40 mg by mouth daily. 06/09/16   [provider]  megestrol (MEGACE) 400 MG/10ML suspension Take 10 mLs (400 mg total) by mouth 2 (two) times daily. 01/25/22   Derek Jack, MD  pantoprazole (PROTONIX) 40 MG tablet Take 1 tablet (40 mg total) by mouth daily. 08/09/21   Cherene Altes, MD  prochlorperazine (COMPAZINE) 10 MG tablet TAKE (1) TABLET BY MOUTH EVERY SIX HOURS AS NEEDED. 11/12/21   Derek Jack, MD  vitamin B-12 (CYANOCOBALAMIN) 1000 MCG tablet Take 1,000 mcg by mouth daily.    [provider]  Past Surgical History Past Surgical History:  Procedure Laterality Date   CARPAL TUNNEL RELEASE Right    COLONOSCOPY N/A 09/19/2013   Procedure: COLONOSCOPY;  Surgeon: Rogene Houston, MD;  [facility];  Service: Endoscopy;  Laterality:  N/A;  830   FINGER AMPUTATION Left    4th and 5th   left eye blindness Left    PORTACATH PLACEMENT Right 10/16/2021   Procedure: INSERTION PORT-A-CATH;  Surgeon: Aviva Signs, MD;  Location: [facility] ORS;  Service: General;  Laterality: Right;   SVT ABLATION N/A 09/21/2018   Procedure: SVT ABLATION;  Surgeon: Constance Haw, MD;  Location: [place] [facility];  Service: Cardiovascular;  Laterality: N/A;   Family History Family History  Problem Relation Age of Onset   CAD Mother    CAD Father    Cancer Brother     Social History Social History   Tobacco Use   Smoking status: Former    Packs/day: 0.50    Types: Cigarettes    Start date: 08/09/1956    Quit date: 08/10/1979    Years since quitting: 42.5   Smokeless tobacco: Never  Vaping Use   Vaping Use: Never used  Substance Use Topics   Alcohol use: No    Alcohol/week: 0.0 standard drinks of alcohol   Drug use: No   Allergies Aspirin and Flomax [tamsulosin]  Review of Systems Review of Systems  Constitutional:  Positive for fatigue.  Neurological:  Positive for dizziness.    Physical Exam Vital Signs  I have reviewed the triage vital signs BP (!) 136/54   Pulse 88   Temp 97.6 F (36.4 C) (Oral)   Resp (!) 25   Ht '5\' 6"'$  (1.676 m)   Wt 66.2 kg   SpO2 100%   BMI 23.57 kg/m   Physical Exam Constitutional:      General: He is not in acute distress.    Appearance: Normal appearance. He is ill-appearing.  HENT:     Head: Normocephalic and atraumatic.     Nose: No congestion or rhinorrhea.  Eyes:     General:        Right eye: No discharge.        Left eye: No discharge.     Extraocular Movements: Extraocular movements intact.     Pupils: Pupils are equal, round, and reactive to light.  Cardiovascular:     Rate and Rhythm: Normal rate. Rhythm irregular.     Heart sounds: No murmur heard. Pulmonary:     Effort: No respiratory distress.     Breath sounds: No wheezing or rales.  Abdominal:     General:  There is no distension.     Tenderness: There is no abdominal tenderness.  Musculoskeletal:        General: Normal range of motion.     Cervical back: Normal range of motion.  Skin:    General: Skin is warm and dry.     Coloration: Skin is pale.  Neurological:     General: No focal deficit present.     Mental Status: He is alert.     ED Results and Treatments Labs (all labs ordered are listed, but only abnormal results are displayed) Labs Reviewed  CBC WITH DIFFERENTIAL/PLATELET - Abnormal; Notable for the following components:      Result Value   WBC 12.0 (*)    RBC 1.76 (*)    Hemoglobin 5.4 (*)    HCT 16.8 (*)    RDW 17.2 (*)  Platelets 52 (*)    nRBC 0.3 (*)    Monocytes Absolute 2.5 (*)    Abs Immature Granulocytes 1.11 (*)    All other components within normal limits  COMPREHENSIVE METABOLIC PANEL - Abnormal; Notable for the following components:   Chloride 113 (*)    CO2 19 (*)    Glucose, Bld 135 (*)    BUN 30 (*)    Creatinine, Ser 1.38 (*)    Calcium 8.3 (*)    Total Protein 6.1 (*)    Albumin 2.7 (*)    AST 10 (*)    Total Bilirubin 0.2 (*)    GFR, Estimated 49 (*)    All other components within normal limits  URINALYSIS, ROUTINE W REFLEX MICROSCOPIC  TYPE AND SCREEN  PREPARE RBC (CROSSMATCH)                                                                                                                          Radiology No results found.  Pertinent labs & imaging results that were available during my care of the patient were reviewed by me and considered in my medical decision making (see MDM for details).  Medications Ordered in ED Medications  0.9 %  sodium chloride infusion (Manually program via Guardrails IV Fluids) (has no administration in time range)  lactated ringers bolus 1,000 mL (1,000 mLs Intravenous New Bag/Given 02/12/22 2312)                                                                                                                                      Procedures .Critical Care  Performed by: Teressa Lower, MD Authorized by: Teressa Lower, MD   Critical care provider statement:    Critical care time (minutes):  30   Critical care was time spent personally by me on the following activities:  Development of treatment plan with patient or surrogate, discussions with consultants, evaluation of patient's response to treatment, examination of patient, ordering and review of laboratory studies, ordering and review of radiographic studies, ordering and performing treatments and interventions, pulse oximetry, re-evaluation of patient's condition and review of old charts   (including critical care time)  Medical Decision Making / ED Course   This patient presents to the ED for concern of dizziness, weakness, this involves an extensive number of treatment options, and is a complaint that carries with it a high  risk of complications and morbidity.  The differential diagnosis includes symptomatic anemia, progression of underlying myelodysplastic syndrome, dehydration, electrolyte abnormality, orthostatic presyncope, cardiogenic presyncope  MDM: Patient seen the emergency room for evaluation of dizziness and weakness.  Physical exam reveals a pale appearing patient with an irregular heart rate but is otherwise unremarkable.  Laboratory evaluation with a leukocytosis to 12.0, hemoglobin 5.4 with a platelet count of 52.  BUN elevated to 30 with a creatinine of 1.38 which is a mild elevation.  2 units packed red blood cells ordered for the patient and a consult was placed to Dr. Delton Coombes but Rexer currently pending.  Patient will require admission for symptomatic anemia requiring blood transfusion and patient was admitted.   Additional history obtained:  -External records from outside source obtained and reviewed including: Chart review including previous notes, labs, imaging, consultation notes   Lab Tests: -I ordered,  reviewed, and interpreted labs.   The pertinent results include:   Labs Reviewed  CBC WITH DIFFERENTIAL/PLATELET - Abnormal; Notable for the following components:      Result Value   WBC 12.0 (*)    RBC 1.76 (*)    Hemoglobin 5.4 (*)    HCT 16.8 (*)    RDW 17.2 (*)    Platelets 52 (*)    nRBC 0.3 (*)    Monocytes Absolute 2.5 (*)    Abs Immature Granulocytes 1.11 (*)    All other components within normal limits  COMPREHENSIVE METABOLIC PANEL - Abnormal; Notable for the following components:   Chloride 113 (*)    CO2 19 (*)    Glucose, Bld 135 (*)    BUN 30 (*)    Creatinine, Ser 1.38 (*)    Calcium 8.3 (*)    Total Protein 6.1 (*)    Albumin 2.7 (*)    AST 10 (*)    Total Bilirubin 0.2 (*)    GFR, Estimated 49 (*)    All other components within normal limits  URINALYSIS, ROUTINE W REFLEX MICROSCOPIC  TYPE AND SCREEN  PREPARE RBC (CROSSMATCH)      EKG   EKG Interpretation  Date/Time:  Friday February 12 2022 22:12:50 EDT Ventricular Rate:  75 PR Interval:  248 QRS Duration: 100 QT Interval:  399 QTC Calculation: 446 R Axis:   53 Text Interpretation: Atrial fibrillation Prolonged PR interval Confirmed by Aariyah Sampey ([id number]) on 02/12/2022 11:57:26 PM          Medicines ordered and prescription drug management: Meds ordered this encounter  Medications   lactated ringers bolus 1,000 mL   0.9 %  sodium chloride infusion (Manually program via Guardrails IV Fluids)    -I have reviewed the patients home medicines and have made adjustments as needed  Critical interventions Packed red blood cell administration  Consultations Obtained: I requested consultation with the oncologist Dr. Delton Coombes, but recommendations are currently pending g   Cardiac Monitoring: The patient was maintained on a cardiac monitor.  I personally viewed and interpreted the cardiac monitored which showed an underlying rhythm of: Atrial fibrillation  Social Determinants of Health:   Factors impacting patients care include: none   Reevaluation: After the interventions noted above, I reevaluated the patient and found that they have :stayed the same  Co morbidities that complicate the patient evaluation  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   Hypertension    Port-A-Cath in place  10/15/2021      Dispostion: I considered admission for this patient, and given symptomatic anemia requiring blood transfusion, patient require admission     Final Clinical Impression(s) / ED Diagnoses Final diagnoses:  None     '@PCDICTATION'$ @    Teressa Lower, MD 02/12/22 2358

## 2022-02-12 NOTE — ED Triage Notes (Signed)
Pt arrived via POV from home c/o weakness and dizziness for a few days. Pt reports he feels like his BP has been high but has not checked it at home. BP is 127/90 in Triage. Pt endorses mild nausea and reports he could possibly be dehydrated.

## 2022-02-13 LAB — PREPARE RBC (CROSSMATCH)

## 2022-02-13 MED ORDER — HEPARIN SOD (PORK) LOCK FLUSH 100 UNIT/ML IV SOLN
500.0000 [IU] | Freq: Once | INTRAVENOUS | Status: AC
  Administered 2022-02-13: 500 [IU]
  Filled 2022-02-13: qty 5

## 2022-02-16 ENCOUNTER — Ambulatory Visit (INDEPENDENT_AMBULATORY_CARE_PROVIDER_SITE_OTHER): Payer: Medicare HMO | Admitting: Urology

## 2022-02-16 VITALS — BP 106/65 | HR 103

## 2022-02-16 DIAGNOSIS — R8281 Pyuria: Secondary | ICD-10-CM

## 2022-02-16 DIAGNOSIS — N5089 Other specified disorders of the male genital organs: Secondary | ICD-10-CM

## 2022-02-16 LAB — BPAM RBC
Blood Product Expiration Date: 202308082359
Blood Product Expiration Date: 202308082359
ISSUE DATE / TIME: 202307080036
ISSUE DATE / TIME: 202307080254
Unit Type and Rh: 7300
Unit Type and Rh: 7300

## 2022-02-16 LAB — URINALYSIS, ROUTINE W REFLEX MICROSCOPIC
Bilirubin, UA: NEGATIVE
Glucose, UA: NEGATIVE
Ketones, UA: NEGATIVE
Nitrite, UA: POSITIVE — AB
Specific Gravity, UA: 1.015 (ref 1.005–1.030)
Urobilinogen, Ur: 4 mg/dL — ABNORMAL HIGH (ref 0.2–1.0)
pH, UA: 6 (ref 5.0–7.5)

## 2022-02-16 LAB — TYPE AND SCREEN
ABO/RH(D): B POS
Antibody Screen: NEGATIVE
Unit division: 0
Unit division: 0

## 2022-02-16 LAB — MICROSCOPIC EXAMINATION
Renal Epithel, UA: NONE SEEN /hpf
WBC, UA: >30 /hpf — AB (ref 0–5)

## 2022-02-16 MED ORDER — SULFAMETHOXAZOLE-TRIMETHOPRIM 800-160 MG PO TABS: 1.0000 | ORAL_TABLET | Freq: Two times a day (BID) | ORAL | 0 refills | Status: DC

## 2022-02-16 NOTE — Progress Notes (Signed)
H&P  Chief Complaint: Left scrotal swelling  History of Present Illness: This man presents for evaluation and management of new left scrotal swelling.  He has been seen here in the past for the following issues:  Urinary retention/urethral trauma  5.2.2023: Here for follow-up of hematuria/retention.   He developed gross hematuria following placement of a Foley catheter about a week ago.  Apparently the balloon was inflated within his prostate.  He had immediate pain and gross hematuria.  He came here and catheter was replaced, bladder was irrigated, and his urine has been clear since.   He feels that his retention was secondary to constipation.  His bowels are acting normally now.  He was not placed on any alpha blockers.   5.16.2023: Voiding better.     History of elevated PSA.   He underwent ultrasound biopsy of his prostate in January 2016.  At that time PSA was 8.74 with percent free 16.  Prostatic volume was 61 mL, PSA density 0.14.  There was 1 core at the right base which revealed atypia.  All of the cores were benign.   Seen in October 2020.  At that time PSA was 13.3.   3.15.2022: PSA 14.8   3.21.2023:Here for routine follow-up. No recent PSA. 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  He has had no real urinary symptoms.  He has a good stream and feels like he empties well.     7.11.2023: About a week ago he noted swelling and minimal tenderness of his left testicle.  This has persisted.  He has had no gross hematuria or dysuria.  He has had no fever or chills.  No right-sided complaints. 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   Hypertension    Port-A-Cath in place 10/15/2021    Past Surgical History:  Procedure Laterality Date   CARPAL TUNNEL RELEASE Right    COLONOSCOPY N/A 09/19/2013   Procedure: COLONOSCOPY;  Surgeon: Rogene Houston, MD;  [facility];  Service: Endoscopy;  Laterality:  N/A;  830   FINGER AMPUTATION Left    4th and 5th   left eye blindness Left    PORTACATH PLACEMENT Right 10/16/2021   Procedure: INSERTION PORT-A-CATH;  Surgeon: Aviva Signs, MD;  Location: [facility];  Service: General;  Laterality: Right;   SVT ABLATION N/A 09/21/2018   Procedure: SVT ABLATION;  Surgeon: Constance Haw, MD;  Location: [place] [facility];  Service: Cardiovascular;  Laterality: N/A;    Home Medications:  Allergies as of 02/16/2022       Reactions   Aspirin Other (See Comments)   Gi upset, can tolerate baby aspirin   Flomax [tamsulosin] Nausea Only        Medication List        Accurate as of February 16, 2022  7:57 AM. If you have any questions, ask your nurse or doctor.          albuterol 108 (90 Base) MCG/ACT inhaler Commonly known as: VENTOLIN HFA Inhale 2 puffs into the lungs every 6 (six) hours as needed for wheezing or shortness of breath.   alfuzosin 10 MG 24 hr tablet Commonly known as: UROXATRAL Take 1 tablet (10 mg total) by mouth daily with breakfast.   azaCITIDine 5 mg/2 mLs in lactated ringers infusion Inject 75 mg/m2 into the vein daily. Days 1-5 every 28 days   cholecalciferol 1000 units  tablet Commonly known as: VITAMIN D Take 1,000 Units by mouth daily.   cloNIDine 0.1 MG tablet Commonly known as: CATAPRES Take 0.1 mg by mouth daily.   docusate sodium 100 MG capsule Commonly known as: Colace Take 1 capsule (100 mg total) by mouth 2 (two) times daily.   hydrochlorothiazide 25 MG tablet Commonly known as: HYDRODIURIL Take 25 mg by mouth daily.   levocetirizine 5 MG tablet Commonly known as: XYZAL Take 5 mg by mouth daily.   lidocaine-prilocaine cream Commonly known as: EMLA Apply a dime-sized amount to port a cath site (do not rub in) and cover with plastic wrap one hour prior to infusion appointments   lisinopril 40 MG tablet Commonly known as: ZESTRIL Take 40 mg by mouth daily.   megestrol 400 MG/10ML  suspension Commonly known as: MEGACE Take 10 mLs (400 mg total) by mouth 2 (two) times daily.   pantoprazole 40 MG tablet Commonly known as: PROTONIX Take 1 tablet (40 mg total) by mouth daily.   prochlorperazine 10 MG tablet Commonly known as: COMPAZINE TAKE (1) TABLET BY MOUTH EVERY SIX HOURS AS NEEDED.   vitamin B-12 1000 MCG tablet Commonly known as: CYANOCOBALAMIN Take 1,000 mcg by mouth daily.        Allergies:  Allergies  Allergen Reactions   Aspirin Other (See Comments)    Gi upset, can tolerate baby aspirin   Flomax [Tamsulosin] Nausea Only    Family History  Problem Relation Age of Onset   CAD Mother    CAD Father    Cancer Brother     Social History:  reports that he quit smoking about 42 years ago. His smoking use included cigarettes. He started smoking about 65 years ago. He smoked an average of .5 packs per day. He has never used smokeless tobacco. He reports that he does not drink alcohol and does not use drugs.  ROS: A complete review of systems was performed.  All systems are negative except for pertinent findings as noted.  Physical Exam:  Vital signs in last 24 hours: There were no vitals taken for this visit. Constitutional:  Alert and oriented, No acute distress Cardiovascular: Regular rate  Respiratory: Normal respiratory effort GI: Abdomen is soft, nontender, nondistended, no abdominal masses. No CVAT.  Genitourinary: Phallus normal.  Right testicle somewhat atrophic, nontender.  Left testicle enlarged, nontender.  Some firmness of left epididymis which is located normally posteriorly.  No skin changes. Lymphatic: No lymphadenopathy Neurologic: Grossly intact, no focal deficits Psychiatric: Normal mood and affect  I have reviewed prior pt notes  I have reviewed notes from referring/previous physicians  I have reviewed urinalysis results  I have reviewed prior PSA results  I have reviewed prior urine culture   Impression/Assessment:   Left testicular swelling, etiology unknown.  Plan:  1.  I will send him for scrotal ultrasound  2.  Urine was cultured, antibiotics were sent in

## 2022-02-18 LAB — URINE CULTURE

## 2022-02-22 ENCOUNTER — Telehealth: Payer: Self-pay

## 2022-02-22 NOTE — Telephone Encounter (Signed)
-----   Message from Franchot Gallo, MD sent at 02/22/2022  6:43 AM EDT ----- Let pt know that urine culture was positive--finish all of abx sent in ----- Message ----- From: Audie Box, CMA Sent: 02/18/2022   2:08 PM EDT To: Franchot Gallo, MD  Please review, pt currently taking Bactrim

## 2022-02-22 NOTE — Telephone Encounter (Signed)
Patient made aware and will finish antibiotic

## 2022-02-24 ENCOUNTER — Other Ambulatory Visit: Payer: Self-pay | Admitting: Student

## 2022-02-24 DIAGNOSIS — C9310 Chronic myelomonocytic leukemia not having achieved remission: Secondary | ICD-10-CM

## 2022-02-25 ENCOUNTER — Ambulatory Visit (HOSPITAL_COMMUNITY)
Admission: RE | Admit: 2022-02-25 | Discharge: 2022-02-25 | Disposition: A | Discharge status: Home / Self Care | Payer: Medicare HMO | Source: Ambulatory Visit | Attending: Hematology | Admitting: Hematology

## 2022-02-25 ENCOUNTER — Other Ambulatory Visit: Payer: Self-pay

## 2022-02-25 ENCOUNTER — Encounter (HOSPITAL_COMMUNITY): Payer: Self-pay

## 2022-02-25 DIAGNOSIS — D469 Myelodysplastic syndrome, unspecified: Secondary | ICD-10-CM

## 2022-02-25 DIAGNOSIS — C9310 Chronic myelomonocytic leukemia not having achieved remission: Secondary | ICD-10-CM | POA: Diagnosis not present

## 2022-02-25 DIAGNOSIS — Z87891 Personal history of nicotine dependence: Secondary | ICD-10-CM | POA: Insufficient documentation

## 2022-02-25 DIAGNOSIS — R0609 Other forms of dyspnea: Secondary | ICD-10-CM | POA: Diagnosis not present

## 2022-02-25 LAB — CBC WITH DIFFERENTIAL/PLATELET
Abs Immature Granulocytes: 0.4 10*3/uL — ABNORMAL HIGH (ref 0.00–0.07)
Basophils Absolute: 0 10*3/uL (ref 0.0–0.1)
Basophils Relative: 0 %
Blasts: 2 %
Eosinophils Absolute: 0 10*3/uL (ref 0.0–0.5)
Eosinophils Relative: 0 %
HCT: 21.6 % — ABNORMAL LOW (ref 39.0–52.0)
Hemoglobin: 7.2 g/dL — ABNORMAL LOW (ref 13.0–17.0)
Lymphocytes Relative: 39 %
Lymphs Abs: 2.8 10*3/uL (ref 0.7–4.0)
MCH: 31.2 pg (ref 26.0–34.0)
MCHC: 33.3 g/dL (ref 30.0–36.0)
MCV: 93.5 fL (ref 80.0–100.0)
Monocytes Absolute: 2 10*3/uL — ABNORMAL HIGH (ref 0.1–1.0)
Monocytes Relative: 28 %
Myelocytes: 5 %
Neutro Abs: 1.8 10*3/uL (ref 1.7–7.7)
Neutrophils Relative %: 25 %
Platelets: 35 10*3/uL — ABNORMAL LOW (ref 150–400)
Promyelocytes Relative: 1 %
RBC: 2.31 MIL/uL — ABNORMAL LOW (ref 4.22–5.81)
RDW: 15.6 % — ABNORMAL HIGH (ref 11.5–15.5)
WBC: 7.2 10*3/uL (ref 4.0–10.5)
nRBC: 1 % — ABNORMAL HIGH (ref 0.0–0.2)

## 2022-02-25 MED ORDER — MIDAZOLAM HCL 2 MG/2ML IJ SOLN
INTRAMUSCULAR | Status: AC
  Filled 2022-02-25: qty 4

## 2022-02-25 MED ORDER — LIDOCAINE HCL (PF) 1 % IJ SOLN
INTRAMUSCULAR | Status: AC | PRN
  Administered 2022-02-25: 20 mL

## 2022-02-25 MED ORDER — SODIUM CHLORIDE 0.9 % IV SOLN: INTRAVENOUS | Status: DC

## 2022-02-25 MED ORDER — MIDAZOLAM HCL 2 MG/2ML IJ SOLN
INTRAMUSCULAR | Status: AC | PRN
  Administered 2022-02-25 (×2): .5 mg via INTRAVENOUS

## 2022-02-25 MED ORDER — FENTANYL CITRATE (PF) 100 MCG/2ML IJ SOLN
INTRAMUSCULAR | Status: AC
  Filled 2022-02-25: qty 2

## 2022-02-25 MED ORDER — FENTANYL CITRATE (PF) 100 MCG/2ML IJ SOLN
INTRAMUSCULAR | Status: AC | PRN
  Administered 2022-02-25 (×2): 25 ug via INTRAVENOUS

## 2022-02-25 MED ORDER — HEPARIN SOD (PORK) LOCK FLUSH 100 UNIT/ML IV SOLN
500.0000 [IU] | INTRAVENOUS | Status: AC | PRN
  Administered 2022-02-25: 500 [IU]
  Filled 2022-02-25: qty 5

## 2022-02-25 NOTE — Procedures (Signed)
Interventional Radiology Procedure Note  Procedure: CT BM ASP AND CORE BX    Complications: None  Estimated Blood Loss:  MIN  Findings: 11 G CORE AND ASP    M. TREVOR Alejah Aristizabal, MD    

## 2022-02-25 NOTE — Discharge Instructions (Signed)

## 2022-02-25 NOTE — H&P (Signed)
Chief Complaint: Patient was seen in consultation today for No chief complaint on file.  at the request of Katragadda,Sreedhar  Referring Physician(s): Katragadda,Sreedhar  Supervising Physician: Daryll Brod  Patient Status: [facility] - Out-pt  History of Present Illness: Dustin Tucker is a 88 y.o. 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  He had a bone marrow biopsy with IR in 09/2021.  She began Azacitidine IV in March 2023.  He presents today for bone marrow biopsy to assess treatment response.  He denies n/v/d, hematuria, hematochezia, infections, and fevers. He reports poor taste since start of treatment, and his appetite is poor.  He endorses DOE.     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   Hypertension    Port-A-Cath in place 10/15/2021    Past Surgical History:  Procedure Laterality Date   CARPAL TUNNEL RELEASE Right    COLONOSCOPY N/A 09/19/2013   Procedure: COLONOSCOPY;  Surgeon: Rogene Houston, MD;  Location: [facility];  Service: Endoscopy;  Laterality: N/A;  830   FINGER AMPUTATION Left    4th and 5th   left eye blindness Left    PORTACATH PLACEMENT Right 10/16/2021   Procedure: INSERTION PORT-A-CATH;  Surgeon: Aviva Signs, MD;  Location: [facility];  Service: General;  Laterality: Right;   SVT ABLATION N/A 09/21/2018   Procedure: SVT ABLATION;  Surgeon: Constance Haw, MD;  Location: [facility];  Service: Cardiovascular;  Laterality: N/A;    Allergies: Aspirin and Flomax [tamsulosin]  Medications: Prior to Admission medications   Medication Sig Start Date End Date Taking? Authorizing Provider  albuterol (VENTOLIN HFA) 108 (90 Base) MCG/ACT inhaler Inhale 2 puffs into the lungs every 6 (six) hours as needed for wheezing or shortness of breath. 07/28/21  Yes [provider]  alfuzosin (UROXATRAL) 10 MG 24 hr tablet Take 1  tablet (10 mg total) by mouth daily with breakfast. 12/08/21  Yes Dahlstedt, Annie Main, MD  azaCITIDine 5 mg/2 mLs in lactated ringers infusion Inject 75 mg/m2 into the vein daily. Days 1-5 every 28 days 10/19/21  Yes [provider]  cholecalciferol (VITAMIN D) 1000 units tablet Take 1,000 Units by mouth daily.   Yes [provider]  docusate sodium (COLACE) 100 MG capsule Take 1 capsule (100 mg total) by mouth 2 (two) times daily. 12/01/21  Yes Derek Jack, MD  levocetirizine (XYZAL) 5 MG tablet Take 5 mg by mouth daily. 07/28/21  Yes [provider]  megestrol (MEGACE) 400 MG/10ML suspension Take 10 mLs (400 mg total) by mouth 2 (two) times daily. 01/25/22  Yes Derek Jack, MD  pantoprazole (PROTONIX) 40 MG tablet Take 1 tablet (40 mg total) by mouth daily. 08/09/21  Yes Cherene Altes, MD  prochlorperazine (COMPAZINE) 10 MG tablet TAKE (1) TABLET BY MOUTH EVERY SIX HOURS AS NEEDED. 11/12/21  Yes Derek Jack, MD  sulfamethoxazole-trimethoprim (BACTRIM DS) 800-160 MG tablet Take 1 tablet by mouth 2 (two) times daily. 02/16/22  Yes Dahlstedt, Annie Main, MD  vitamin B-12 (CYANOCOBALAMIN) 1000 MCG tablet Take 1,000 mcg by mouth daily.   Yes [provider]  cloNIDine (CATAPRES) 0.1 MG tablet Take 0.1 mg by mouth daily. 05/16/18   [provider]  hydrochlorothiazide (HYDRODIURIL) 25 MG tablet Take 25 mg by mouth daily. 06/25/16   [provider]  lidocaine-prilocaine (EMLA) cream Apply a dime-sized amount to port  a cath site (do not rub in) and cover with plastic wrap one hour prior to infusion appointments 10/15/21   Derek Jack, MD  lisinopril (PRINIVIL,ZESTRIL) 40 MG tablet Take 40 mg by mouth daily. 06/09/16   [provider]     Family History  Problem Relation Age of Onset   CAD Mother    CAD Father    Cancer Brother     Social History   Socioeconomic History   Marital status: Widowed    Spouse name:  Not on file   Number of children: Not on file   Years of education: Not on file   Highest education level: Not on file  Occupational History   Not on file  Tobacco Use   Smoking status: Former    Packs/day: 0.50    Types: Cigarettes    Start date: 08/09/1956    Quit date: 08/10/1979    Years since quitting: 42.5   Smokeless tobacco: Never  Vaping Use   Vaping Use: Never used  Substance and Sexual Activity   Alcohol use: No    Alcohol/week: 0.0 standard drinks of alcohol   Drug use: No   Sexual activity: Not Currently  Other Topics Concern   Not on file  Social History Narrative   Not on file   Social Determinants of Health   Financial Resource Strain: Not on file  Food Insecurity: Not on file  Transportation Needs: Not on file  Physical Activity: Not on file  Stress: Not on file  Social Connections: Not on file    Review of Systems: A 12 point ROS discussed and pertinent positives are indicated in the HPI above.  All other systems are negative.  Vital Signs: BP (!) 145/79   Pulse 87   Temp 98 F (36.7 C) (Oral)   Resp 16   Ht 5' 5"  (1.651 m)   Wt 146 lb (66.2 kg)   SpO2 97%   BMI 24.30 kg/m   Physical Exam Constitutional:      Appearance: He is ill-appearing.  HENT:     Head: Normocephalic and atraumatic.     Mouth/Throat:     Pharynx: Oropharynx is clear.  Eyes:     Extraocular Movements: Extraocular movements intact.     Conjunctiva/sclera: Conjunctivae normal.  Cardiovascular:     Rate and Rhythm: Normal rate and regular rhythm.     Pulses: Normal pulses.  Pulmonary:     Effort: Pulmonary effort is normal.     Breath sounds: Normal breath sounds.  Abdominal:     General: Abdomen is flat.     Palpations: Abdomen is soft.  Skin:    General: Skin is warm and dry.  Neurological:     General: No focal deficit present.     Mental Status: He is alert and oriented to person, place, and time.  Psychiatric:        Mood and Affect: Mood normal.         Behavior: Behavior normal.     Imaging: No results found.  Labs:  CBC: Recent Labs    01/25/22 1046 02/01/22 0954 02/12/22 2302 02/25/22 0730  WBC 6.3 13.0* 12.0* 7.2  HGB 6.8* 7.1* 5.4* 7.2*  HCT 21.1* 21.3* 16.8* 21.6*  PLT 51* DCLMP 52* 35*    COAGS: No results for input(s): "INR", "APTT" in the last 8760 hours.  BMP: Recent Labs    01/06/22 0912 01/25/22 1046 02/01/22 0954 02/12/22 2302  NA 137 139 137 139  K 3.8 3.8 3.9 4.1  CL 109 108 107 113*  CO2 25 25 22  19*  GLUCOSE 93 99 89 135*  BUN 21 18 22  30*  CALCIUM 8.5* 8.6* 8.4* 8.3*  CREATININE 1.08 1.08 1.15 1.38*  GFRNONAA >60 >60 >60 49*    LIVER FUNCTION TESTS: Recent Labs    01/06/22 0912 01/25/22 1046 02/01/22 0954 02/12/22 2302  BILITOT 1.0 0.7 0.7 0.2*  AST 18 14* 12* 10*  ALT 11 11 11 9   ALKPHOS 48 58 47 46  PROT 6.0* 6.5 5.7* 6.1*  ALBUMIN 3.3* 3.1* 2.9* 2.7*   Assessment and Plan:  Myelodysplastic syndrome --in treatment since March.  Presents today to assess treatment response. --for bone marrow biopsy today with moderate sedation and expected discharge later this morning.  Risks and benefits of bone marrow biopsy was discussed with the patient and/or patient's family including, but not limited to bleeding, infection, damage to adjacent structures or low yield requiring additional tests.  All of the questions were answered and there is agreement to proceed.  Consent signed and in chart.   Thank you for this interesting consult.  I greatly enjoyed meeting Dustin Tucker and look forward to participating in their care.  A copy of this report was sent to the requesting provider on this date.  Electronically Signed: Pasty Spillers, PA 02/25/2022, 8:51 AM   I spent a total of  25 Minutes in face to face in clinical consultation, greater than 50% of which was counseling/coordinating care for MDS

## 2022-03-01 ENCOUNTER — Inpatient Hospital Stay (HOSPITAL_BASED_OUTPATIENT_CLINIC_OR_DEPARTMENT_OTHER): Payer: Medicare HMO | Admitting: Hematology

## 2022-03-01 ENCOUNTER — Other Ambulatory Visit: Payer: Self-pay

## 2022-03-01 ENCOUNTER — Inpatient Hospital Stay (HOSPITAL_COMMUNITY): Payer: Medicare HMO | Attending: Hematology

## 2022-03-01 ENCOUNTER — Inpatient Hospital Stay (HOSPITAL_COMMUNITY): Payer: Medicare HMO

## 2022-03-01 ENCOUNTER — Inpatient Hospital Stay (HOSPITAL_COMMUNITY): Payer: Medicare HMO | Admitting: Dietician

## 2022-03-01 VITALS — BP 128/73 | HR 101 | Temp 97.9°F | Resp 16 | Ht 66.0 in | Wt 141.9 lb

## 2022-03-01 VITALS — BP 133/68 | HR 87 | Temp 97.5°F | Resp 18

## 2022-03-01 DIAGNOSIS — D696 Thrombocytopenia, unspecified: Secondary | ICD-10-CM | POA: Insufficient documentation

## 2022-03-01 DIAGNOSIS — D469 Myelodysplastic syndrome, unspecified: Secondary | ICD-10-CM

## 2022-03-01 DIAGNOSIS — I10 Essential (primary) hypertension: Secondary | ICD-10-CM | POA: Insufficient documentation

## 2022-03-01 DIAGNOSIS — R634 Abnormal weight loss: Secondary | ICD-10-CM | POA: Insufficient documentation

## 2022-03-01 DIAGNOSIS — Z809 Family history of malignant neoplasm, unspecified: Secondary | ICD-10-CM | POA: Insufficient documentation

## 2022-03-01 DIAGNOSIS — R1013 Epigastric pain: Secondary | ICD-10-CM | POA: Insufficient documentation

## 2022-03-01 DIAGNOSIS — Z87891 Personal history of nicotine dependence: Secondary | ICD-10-CM | POA: Diagnosis not present

## 2022-03-01 DIAGNOSIS — C9310 Chronic myelomonocytic leukemia not having achieved remission: Secondary | ICD-10-CM | POA: Insufficient documentation

## 2022-03-01 DIAGNOSIS — Z800 Family history of malignant neoplasm of digestive organs: Secondary | ICD-10-CM | POA: Insufficient documentation

## 2022-03-01 DIAGNOSIS — D539 Nutritional anemia, unspecified: Secondary | ICD-10-CM | POA: Diagnosis not present

## 2022-03-01 LAB — CBC WITH DIFFERENTIAL/PLATELET
Abs Immature Granulocytes: 0 10*3/uL (ref 0.00–0.07)
Basophils Absolute: 0 10*3/uL (ref 0.0–0.1)
Basophils Relative: 0 %
Eosinophils Absolute: 0.1 10*3/uL (ref 0.0–0.5)
Eosinophils Relative: 1 %
HCT: 20.7 % — ABNORMAL LOW (ref 39.0–52.0)
Hemoglobin: 6.8 g/dL — CL (ref 13.0–17.0)
Lymphocytes Relative: 37 %
Lymphs Abs: 3 10*3/uL (ref 0.7–4.0)
MCH: 30.9 pg (ref 26.0–34.0)
MCHC: 32.9 g/dL (ref 30.0–36.0)
MCV: 94.1 fL (ref 80.0–100.0)
Monocytes Absolute: 1.8 10*3/uL — ABNORMAL HIGH (ref 0.1–1.0)
Monocytes Relative: 23 %
Neutro Abs: 3.1 10*3/uL (ref 1.7–7.7)
Neutrophils Relative %: 39 %
Platelets: 46 10*3/uL — ABNORMAL LOW (ref 150–400)
RBC: 2.2 MIL/uL — ABNORMAL LOW (ref 4.22–5.81)
RDW: 15.6 % — ABNORMAL HIGH (ref 11.5–15.5)
Smear Review: DECREASED
WBC: 8 10*3/uL (ref 4.0–10.5)
nRBC: 3.1 % — ABNORMAL HIGH (ref 0.0–0.2)
nRBC: 6 /100 WBC — ABNORMAL HIGH

## 2022-03-01 LAB — COMPREHENSIVE METABOLIC PANEL
ALT: 11 U/L (ref 0–44)
AST: 12 U/L — ABNORMAL LOW (ref 15–41)
Albumin: 3.1 g/dL — ABNORMAL LOW (ref 3.5–5.0)
Alkaline Phosphatase: 55 U/L (ref 38–126)
Anion gap: 5 (ref 5–15)
BUN: 23 mg/dL (ref 8–23)
CO2: 21 mmol/L — ABNORMAL LOW (ref 22–32)
Calcium: 8.5 mg/dL — ABNORMAL LOW (ref 8.9–10.3)
Chloride: 110 mmol/L (ref 98–111)
Creatinine, Ser: 1.14 mg/dL (ref 0.61–1.24)
GFR, Estimated: >60 mL/min (ref >60)
Glucose, Bld: 121 mg/dL — ABNORMAL HIGH (ref 70–99)
Potassium: 3.6 mmol/L (ref 3.5–5.1)
Sodium: 136 mmol/L (ref 135–145)
Total Bilirubin: 0.7 mg/dL (ref 0.3–1.2)
Total Protein: 6.3 g/dL — ABNORMAL LOW (ref 6.5–8.1)

## 2022-03-01 LAB — SURGICAL PATHOLOGY

## 2022-03-01 LAB — MAGNESIUM: Magnesium: 2 mg/dL (ref 1.7–2.4)

## 2022-03-01 LAB — LACTATE DEHYDROGENASE: LDH: 148 U/L (ref 98–192)

## 2022-03-01 LAB — PREPARE RBC (CROSSMATCH)

## 2022-03-01 MED ORDER — SODIUM CHLORIDE 0.9% FLUSH
10.0000 mL | INTRAVENOUS | Status: AC | PRN
  Administered 2022-03-01: 10 mL

## 2022-03-01 MED ORDER — ACETAMINOPHEN 325 MG PO TABS
650.0000 mg | ORAL_TABLET | Freq: Once | ORAL | Status: AC
  Administered 2022-03-01: 650 mg via ORAL
  Filled 2022-03-01: qty 2

## 2022-03-01 MED ORDER — SODIUM CHLORIDE 0.9% IV SOLUTION
250.0000 mL | Freq: Once | INTRAVENOUS | Status: AC
  Administered 2022-03-01: 250 mL via INTRAVENOUS

## 2022-03-01 MED ORDER — DIPHENHYDRAMINE HCL 25 MG PO CAPS
25.0000 mg | ORAL_CAPSULE | Freq: Once | ORAL | Status: AC
  Administered 2022-03-01: 25 mg via ORAL
  Filled 2022-03-01: qty 1

## 2022-03-01 MED ORDER — HEPARIN SOD (PORK) LOCK FLUSH 100 UNIT/ML IV SOLN
500.0000 [IU] | Freq: Every day | INTRAVENOUS | Status: AC | PRN
  Administered 2022-03-01: 500 [IU]

## 2022-03-01 NOTE — Progress Notes (Signed)
CRITICAL VALUE ALERT Critical value received:  HGB 6.8 Date of notification:  03-01-2022 Time of notification: 10:48 am.  Critical value read back:  Yes.   Nurse who received alert:  Bpresnell RN MD notified time and response:  Katragadda @ 10:54am. Patient to receive 1 unit of blood today.    Message received to infuse 2 units of blood per A. Beckie Salts / Dr. Vickey Huger. Patient symptomatic.

## 2022-03-01 NOTE — Patient Instructions (Signed)
[facility]  Discharge Instructions: Thank you for choosing [facility] to provide your oncology and hematology care.  If you have a lab appointment with the [facility], please come in thru the Main Entrance and check in at the main information desk.  Wear comfortable clothing and clothing appropriate for easy access to any Portacath or PICC line.   We strive to give you quality time with your provider. You may need to reschedule your appointment if you arrive late (15 or more minutes).  Arriving late affects you and other patients whose appointments are after yours.  Also, if you miss three or more appointments without notifying the office, you may be dismissed from the clinic at the provider's discretion.      For prescription refill requests, have your pharmacy contact our office and allow 72 hours for refills to be completed.    Today you received 2 units of blood.     BELOW ARE SYMPTOMS THAT SHOULD BE REPORTED IMMEDIATELY: *FEVER GREATER THAN 100.4 F (38 C) OR HIGHER *CHILLS OR SWEATING *NAUSEA AND VOMITING THAT IS NOT CONTROLLED WITH YOUR NAUSEA MEDICATION *UNUSUAL SHORTNESS OF BREATH *UNUSUAL BRUISING OR BLEEDING *URINARY PROBLEMS (pain or burning when urinating, or frequent urination) *BOWEL PROBLEMS (unusual diarrhea, constipation, pain near the anus) TENDERNESS IN MOUTH AND THROAT WITH OR WITHOUT PRESENCE OF ULCERS (sore throat, sores in mouth, or a toothache) UNUSUAL RASH, SWELLING OR PAIN  UNUSUAL VAGINAL DISCHARGE OR ITCHING   Items with * indicate a potential emergency and should be followed up as soon as possible or go to the Emergency Department if any problems should occur.  Please show the CHEMOTHERAPY ALERT CARD or IMMUNOTHERAPY ALERT CARD at check-in to the Emergency Department and triage nurse.  Should you have questions after your visit or need to cancel or reschedule your appointment, please contact [facility]  [phone]  and follow the prompts.  Office hours are 8:00 a.m. to 4:30 p.m. Monday - Friday. Please note that voicemails left after 4:00 p.m. may not be returned until the following business day.  We are closed weekends and major holidays. You have access to a nurse at all times for urgent questions. Please call the main number to the clinic [phone] and follow the prompts.  For any non-urgent questions, you may also contact your provider using MyChart. We now offer e-Visits for anyone 67 and older to request care online for non-urgent symptoms. For details visit [email or web address].   Also download the MyChart app! Go to the app store, search "MyChart", open the app, select , and log in with your MyChart username and password.  Masks are optional in the cancer centers. If you would like for your care team to wear a mask while they are taking care of you, please let them know. For doctor visits, patients may have with them one support person who is at least 86 years old. At this time, visitors are not allowed in the infusion area.

## 2022-03-01 NOTE — Progress Notes (Signed)
Pt presents today for Vidaza per provider's order. Vital signs stable. No treatment today due to continued weight loss and pending bone marrow biopsy per Dr.K. Pt will receive 2 units of blood due to hemoglobin of 6.8 today.  2 units of blood given today per MD orders. Tolerated infusion without adverse affects. Vital signs stable. No complaints at this time. Discharged from clinic ambulatory in stable condition. Alert and oriented x 3. F/U with [facility] as scheduled.

## 2022-03-01 NOTE — Progress Notes (Signed)
Nutrition Follow-up:  Patient with chronic myelomonocytic leukemia. His currently receiving azacitidine q28d. Treatment held today secondary to continued weight loss.   Met with patient in clinic. He reports good appetite. Patient endorses taking megace 2x/day. He continues to have altered taste. The only foods that taste "good" are string beans, baked beans, and orange sherbet. Everything else "taste like its gone bad." Yesterday pt recalls 2 eggs for breakfast, cup of applesauce, 2 Ensure Complete, and baked beans for supper. Most evenings he drinks a shake (sherbet/Ensure) but did not have one last night. Patient endorses oral care 1-2x/day. He has not been doing baking soda salt water rinses regularly. This worked well for him when using in the past. Patient denies nausea, vomiting, constipation, diarrhea.    Medications: megace  Labs: glucose 121, Hgb 6.8 (pt to receive transfusion today)  Anthropometrics: Weights continue to trend down. Pt 141 lb 14.4 oz today decreased from 146 lb 8 oz on 6/19 and 152 lb 12.8 oz on 5/15   5 lbs (3.4%) in 3 weeks, 11 lbs (7.2%) in 6 weeks - significant    NUTRITION DIAGNOSIS: Moderate malnutrition continues    INTERVENTION:  Reinforced education and strategies for altered taste. Encouraged pt to resume baking soda/salt water rinses several times daily and before meals. Additional handout with tips and recipe provided.  Continue appetite stimulant per MD Suggested switching to orange flavored ice cream vs sherbet for more calories  Encouraged soft moist high protein foods for ease of intake - handout with ideas provided  Continue drinking Ensure Complete/equivalent, recommend 3/day Provided 2 samples of Boost VHC for pt to try (530 kcal, 22 g protein) Suggested pt switch to this if able to tolerate. Coupons + ordering information written down for pt to share with his daughter.   MONITORING, EVALUATION, GOAL: weight trends, intake    NEXT VISIT:  Monday August 7 during infusion     

## 2022-03-01 NOTE — Patient Instructions (Addendum)
[facility] at [facility] Discharge Instructions   You were seen and examined today by Dr. Delton Coombes.  He reviewed the results of your lab work which are normal/stable.   We will hold your treatment today due to your continued weight loss.   Continue taking Megace 2 teaspoons twice a day.   You will meet with the dietician today.   Return as scheduled.    Thank you for choosing [facility] at [facility] to provide your oncology and hematology care.  To afford each patient quality time with our provider, please arrive at least 15 minutes before your scheduled appointment time.   If you have a lab appointment with the [facility] please come in thru the Main Entrance and check in at the main information desk.  You need to re-schedule your appointment should you arrive 10 or more minutes late.  We strive to give you quality time with our providers, and arriving late affects you and other patients whose appointments are after yours.  Also, if you no show three or more times for appointments you may be dismissed from the clinic at the providers discretion.     Again, thank you for choosing [facility].  Our hope is that these requests will decrease the amount of time that you wait before being seen by our physicians.       _____________________________________________________________  Should you have questions after your visit to [facility], please contact our office at [phone] and follow the prompts.  Our office hours are 8:00 a.m. and 4:30 p.m. Monday - Friday.  Please note that voicemails left after 4:00 p.m. may not be returned until the following business day.  We are closed weekends and major holidays.  You do have access to a nurse 24-7, just call the main number to the clinic [phone] and do not press any options, hold on the line and a nurse will answer the phone.    For prescription refill requests,  have your pharmacy contact our office and allow 72 hours.    Due to Covid, you will need to wear a mask upon entering the hospital. If you do not have a mask, a mask will be given to you at the Main Entrance upon arrival. For doctor visits, patients may have 1 support person age 31 or older with them. For treatment visits, patients can not have anyone with them due to social distancing guidelines and our immunocompromised population.

## 2022-03-01 NOTE — Progress Notes (Signed)
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  INTERVAL HISTORY:  [name], a 88 y.o. male, returns for routine follow-up and consideration for next cycle of chemotherapy. Dustin Tucker was last seen on 01/25/2022.  Due for cycle #5 of Azacitidine today.   Overall, he tells me he has been feeling fair. He reports constant dull abdominal pain below his umbilicus starting 1 day ago; he reports the pain occasionally improved following eating. He denies burning pain or his abdominal pain radiating to his back. He reports regular BM. He also reports SOB. He has lost 4 lbs since his last visit, and he reports his appetite is poor. He reports trouble swallowing as food such as chicken "balls up" in his mouth. He denies vomiting and nausea.  He is drinking 2-3 Ensure daily, and he started Megace which he reports improves his appetite. He continues to have altered taste. He denies history of acid reflux.   Overall, he is not ready for next cycle of chemo today.   REVIEW OF SYSTEMS:  Review of Systems  Constitutional:  Positive for appetite change, fatigue and unexpected weight change (-4 lbs).  HENT:   Positive for trouble swallowing.    Respiratory:  Positive for shortness of breath.   Gastrointestinal:  Positive for abdominal pain. Negative for constipation, diarrhea, nausea and vomiting.  Neurological:  Positive for dizziness.  All other systems reviewed and are negative.   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   Hypertension    Port-A-Cath in place 10/15/2021   Past Surgical History:  Procedure Laterality Date   CARPAL TUNNEL RELEASE Right    COLONOSCOPY N/A 09/19/2013   Procedure: COLONOSCOPY;  Surgeon: Rogene Houston, MD;  Location: [facility];  Service: Endoscopy;  Laterality: N/A;  830   FINGER AMPUTATION Left    4th and 5th   left eye blindness Left    PORTACATH PLACEMENT Right 10/16/2021   Procedure: INSERTION PORT-A-CATH;  Surgeon: Aviva Signs, MD;  Location: [facility];  Service: General;  Laterality: Right;   SVT ABLATION N/A 09/21/2018   Procedure: SVT ABLATION;  Surgeon: Constance Haw, MD;  Location: [facility];  Service: Cardiovascular;  Laterality: N/A;    SOCIAL HISTORY:  Social History   Socioeconomic History   Marital status: Widowed    Spouse name: Not on file   Number of children: Not on file   Years of education: Not on file   Highest education level: Not on file  Occupational History   Not on file  Tobacco Use   Smoking status: Former    Packs/day:  0.50    Types: Cigarettes    Start date: 08/09/1956    Quit date: 08/10/1979    Years since quitting: 42.5   Smokeless tobacco: Never  Vaping Use   Vaping Use: Never used  Substance and Sexual Activity   Alcohol use: No    Alcohol/week: 0.0 standard drinks of alcohol   Drug use: No   Sexual activity: Not Currently  Other Topics Concern   Not on file  Social History Narrative   Not on file   Social Determinants of Health   Financial Resource Strain: Not on file  Food Insecurity: Not on file  Transportation Needs: Not  on file  Physical Activity: Not on file  Stress: Not on file  Social Connections: Not on file  Intimate Partner Violence: Not on file    FAMILY HISTORY:  Family History  Problem Relation Age of Onset   CAD Mother    CAD Father    Cancer Brother     CURRENT MEDICATIONS:  Current Outpatient Medications  Medication Sig Dispense Refill   albuterol (VENTOLIN HFA) 108 (90 Base) MCG/ACT inhaler Inhale 2 puffs into the lungs every 6 (six) hours as needed for wheezing or shortness of breath.     alfuzosin (UROXATRAL) 10 MG 24 hr tablet Take 1 tablet (10 mg total) by mouth daily with breakfast. 30 tablet 11   azaCITIDine 5 mg/2 mLs in lactated ringers infusion Inject 75 mg/m2 into the vein daily. Days 1-5 every 28 days     cholecalciferol (VITAMIN D) 1000 units tablet Take 1,000 Units by mouth daily.     cloNIDine (CATAPRES) 0.1 MG tablet Take 0.1 mg by mouth daily.     docusate sodium (COLACE) 100 MG capsule Take 1 capsule (100 mg total) by mouth 2 (two) times daily. 60 capsule 1   hydrochlorothiazide (HYDRODIURIL) 25 MG tablet Take 25 mg by mouth daily.     levocetirizine (XYZAL) 5 MG tablet Take 5 mg by mouth daily.     lisinopril (PRINIVIL,ZESTRIL) 40 MG tablet Take 40 mg by mouth daily.     megestrol (MEGACE) 400 MG/10ML suspension Take 10 mLs (400 mg total) by mouth 2 (two) times daily. 480 mL 3   pantoprazole (PROTONIX) 40 MG tablet Take 1 tablet (40 mg total) by mouth daily. 30 tablet 1   sulfamethoxazole-trimethoprim (BACTRIM DS) 800-160 MG tablet Take 1 tablet by mouth 2 (two) times daily. 10 tablet 0   vitamin B-12 (CYANOCOBALAMIN) 1000 MCG tablet Take 1,000 mcg by mouth daily.     lidocaine-prilocaine (EMLA) cream Apply a dime-sized amount to port a cath site (do not rub in) and cover with plastic wrap one hour prior to infusion appointments (Patient not taking: Reported on 03/01/2022) 30 g 3   prochlorperazine (COMPAZINE) 10 MG tablet TAKE (1) TABLET BY MOUTH EVERY SIX HOURS AS  NEEDED. (Patient not taking: Reported on 03/01/2022) 60 tablet 3   No current facility-administered medications for this visit.   Facility-Administered Medications Ordered in Other Visits  Medication Dose Route Frequency Provider Last Rate Last Admin   0.9 %  sodium chloride infusion (Manually program via Guardrails IV Fluids)  250 mL Intravenous Once Derek Jack, MD        ALLERGIES:  Allergies  Allergen Reactions   Aspirin Other (See Comments)    Gi upset, can tolerate baby aspirin   Flomax [Tamsulosin] Nausea Only    PHYSICAL EXAM:  Performance status (ECOG): 1 - Symptomatic but completely ambulatory  Vitals:  03/01/22 0944  BP: 128/73  Pulse: (!) 101  Resp: 16  Temp: 97.9 F (36.6 C)  SpO2: 97%   Wt Readings from Last 3 Encounters:  03/01/22 141 lb 14.4 oz (64.4 kg)  02/25/22 146 lb (66.2 kg)  02/12/22 146 lb (66.2 kg)   Physical Exam Vitals reviewed.  Constitutional:      Appearance: Normal appearance.  Cardiovascular:     Rate and Rhythm: Normal rate and regular rhythm.     Pulses: Normal pulses.     Heart sounds: Normal heart sounds.  Pulmonary:     Effort: Pulmonary effort is normal.     Breath sounds: Normal breath sounds.  Abdominal:     Palpations: Abdomen is soft. There is no mass.     Tenderness: There is no abdominal tenderness.  Musculoskeletal:     Right lower leg: 1+ Edema present.     Left lower leg: 1+ Edema present.  Neurological:     General: No focal deficit present.     Mental Status: He is alert and oriented to person, place, and time.  Psychiatric:        Mood and Affect: Mood normal.        Behavior: Behavior normal.     LABORATORY DATA:  I have reviewed the labs as listed.     Latest Ref Rng & Units 02/25/2022    7:30 AM 02/12/2022   11:02 PM 02/01/2022    9:54 AM  CBC  WBC 4.0 - 10.5 K/uL 7.2  12.0  13.0   Hemoglobin 13.0 - 17.0 g/dL 7.2  5.4  7.1   Hematocrit 39.0 - 52.0 % 21.6  16.8  21.3   Platelets 150 - 400  K/uL 35  52  DCLMP       Latest Ref Rng & Units 02/12/2022   11:02 PM 02/01/2022    9:54 AM 01/25/2022   10:46 AM  CMP  Glucose 70 - 99 mg/dL 135  89  99   BUN 8 - 23 mg/dL _0 Creatinine 0.61 - 1.24 mg/dL 1.38  1.15  1.08   Sodium 135 - 145 mmol/L 139  137  139   Potassium 3.5 - 5.1 mmol/L 4.1  3.9  3.8   Chloride 98 - 111 mmol/L 113  107  108   CO2 22 - 32 mmol/L _1 Calcium 8.9 - 10.3 mg/dL 8.3  8.4  8.6   Total Protein 6.5 - 8.1 g/dL 6.1  5.7  6.5   Total Bilirubin 0.3 - 1.2 mg/dL 0.2  0.7  0.7   Alkaline Phos 38 - 126 U/L 46  47  58   AST 15 - 41 U/L _2 ALT 0 - 44 U/L _3 DIAGNOSTIC IMAGING:  I have independently reviewed the scans and discussed with the patient. CT Biopsy  Result Date: 02/25/2022 INDICATION: MDS, assess for treatment response EXAM: CT GUIDED RIGHT ILIAC BONE MARROW ASPIRATION AND CORE BIOPSY Date:  02/25/2022 02/25/2022 10:02 am Radiologist:  M. Daryll Brod, MD Guidance:  CT FLUOROSCOPY: Fluoroscopy Time: None. MEDICATIONS: 1% lidocaine local ANESTHESIA/SEDATION: 1.0 mg IV Versed; 50 mcg IV Fentanyl Moderate Sedation Time:  10 minute The patient was continuously monitored during the procedure by the interventional radiology nurse under my direct supervision. CONTRAST:  None COMPLICATIONS: None PROCEDURE: Informed consent was obtained from the patient following explanation  of the procedure, risks, benefits and alternatives. The patient understands, agrees and consents for the procedure. All questions were addressed. A time out was performed. The patient was positioned prone and non-contrast localization CT was performed of the pelvis to demonstrate the iliac marrow spaces. Maximal barrier sterile technique utilized including caps, mask, sterile gowns, sterile gloves, large sterile drape, hand hygiene, and Betadine prep. Under sterile conditions and local anesthesia, an 11 gauge coaxial bone biopsy needle was advanced into the right  iliac marrow space. Needle position was confirmed with CT imaging. Initially, bone marrow aspiration was performed. Next, the 11 gauge outer cannula was utilized to obtain a right iliac bone marrow core biopsy. Needle was removed. Hemostasis was obtained with compression. The patient tolerated the procedure well. Samples were prepared with the cytotechnologist. No immediate complications. IMPRESSION: CT guided right iliac bone marrow aspiration and core biopsy. Electronically Signed   By: Jerilynn Mages.  Shick M.D.   On: 02/25/2022 10:38   CT BONE MARROW BIOPSY & ASPIRATION  Result Date: 02/25/2022 INDICATION: MDS, assess for treatment response EXAM: CT GUIDED RIGHT ILIAC BONE MARROW ASPIRATION AND CORE BIOPSY Date:  02/25/2022 02/25/2022 10:02 am Radiologist:  M. Daryll Brod, MD Guidance:  CT FLUOROSCOPY: Fluoroscopy Time: None. MEDICATIONS: 1% lidocaine local ANESTHESIA/SEDATION: 1.0 mg IV Versed; 50 mcg IV Fentanyl Moderate Sedation Time:  10 minute The patient was continuously monitored during the procedure by the interventional radiology nurse under my direct supervision. CONTRAST:  None COMPLICATIONS: None PROCEDURE: Informed consent was obtained from the patient following explanation of the procedure, risks, benefits and alternatives. The patient understands, agrees and consents for the procedure. All questions were addressed. A time out was performed. The patient was positioned prone and non-contrast localization CT was performed of the pelvis to demonstrate the iliac marrow spaces. Maximal barrier sterile technique utilized including caps, mask, sterile gowns, sterile gloves, large sterile drape, hand hygiene, and Betadine prep. Under sterile conditions and local anesthesia, an 11 gauge coaxial bone biopsy needle was advanced into the right iliac marrow space. Needle position was confirmed with CT imaging. Initially, bone marrow aspiration was performed. Next, the 11 gauge outer cannula was utilized to obtain a  right iliac bone marrow core biopsy. Needle was removed. Hemostasis was obtained with compression. The patient tolerated the procedure well. Samples were prepared with the cytotechnologist. No immediate complications. IMPRESSION: CT guided right iliac bone marrow aspiration and core biopsy. Electronically Signed   By: Jerilynn Mages.  Shick M.D.   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  Hemoglobin 15.5 on 09/20/2018.  Platelet count on 09/20/1998 2155. - He was on aspirin 81 mg at presentation.  He received 3 units PRBC.  Ferritin was 244 and percent saturation 37.  D66 was 440 and folic acid normal.  Creatinine was normal. - MMA, copper, folic acid and LDH was normal.  SPEP negative. - Serum EPO 140.  Kappa light chains elevated at 32.4 with ratio 1.55. - Bone marrow biopsy on 09/18/2021: Hypercellular marrow with trilineage dyspoiesis and monocytosis.  Dyspoiesis in the granulocytic precursors without apparent late maturation of neutrophils, increased monocytes but no significant blast population.  Increased megakaryocytes with widely separated nuclear lobes.  Erythroid precursors slightly decreased in number with the type ER.  Ring sideroblasts not identified. - Chromosome analysis and MDS FISH panel was normal. - NGS  testing: NF1, TET2, U2 AF-1, DNMT3A mutations positive. - Based on [facility] molecular model, he has 3 points with circulating immature cells, decreased hemoglobin, decreased platelets.  This puts him in high risk, with overall survival 16 months. - Azacitidine started on 10/19/2021.  Required 2 units PRBC after cycle 2, 3 units PRBC after cycle 3.    Social/family history: - Lives at home by himself.  Son lives in [place].  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  No exposure to chemicals.  Non-smoker. - Brother had colon  cancer.  Another brother had?  Stomach cancer.  2 maternal uncles had cancer, type unknown to the patient.   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 - He did not require 3 units of PRBC after last cycle.  Today's hemoglobin is 6.8.  He will receive 1 more unit. - He underwent bone marrow biopsy on 02/25/2022.  Report is not available today. - I will hold his treatment today due to continuing weight loss. - RTC 2 weeks for follow-up with repeat labs and possible treatment.  2.  Weight loss: - He reports that he is drinking an Ensure 2 cans/day. - He did report epigastric pain started about 8 days ago.  He reports aching pain and eating makes it better. - He has started Megace twice daily which is helping with appetite.  However he does not have good taste. - He lost 4 pounds in the last 4 weeks, total of 20 pounds since March when the treatment was started. - We will let him talk to our dietitian today.  We are holding his treatment.   Orders placed this encounter:  No orders of the defined types were placed in this encounter.    Derek Jack, MD [place] [phone]   I, Thana Ates, am acting as a scribe for Dr. Derek Jack.  I, Derek Jack MD, have reviewed the above documentation for accuracy and completeness, and I agree with the above.

## 2022-03-02 ENCOUNTER — Ambulatory Visit (HOSPITAL_COMMUNITY): Payer: Medicare HMO

## 2022-03-02 LAB — TYPE AND SCREEN
ABO/RH(D): B POS
Antibody Screen: NEGATIVE
Unit division: 0
Unit division: 0

## 2022-03-02 LAB — BPAM RBC
Blood Product Expiration Date: 202308212359
Blood Product Expiration Date: 202308232359
ISSUE DATE / TIME: 202307241206
ISSUE DATE / TIME: 202307241405
Unit Type and Rh: 1700
Unit Type and Rh: 1700

## 2022-03-03 ENCOUNTER — Encounter (HOSPITAL_COMMUNITY): Payer: Self-pay | Admitting: Hematology

## 2022-03-03 ENCOUNTER — Ambulatory Visit (HOSPITAL_COMMUNITY): Payer: Medicare HMO

## 2022-03-04 ENCOUNTER — Ambulatory Visit (HOSPITAL_COMMUNITY): Payer: Medicare HMO

## 2022-03-05 ENCOUNTER — Ambulatory Visit (HOSPITAL_COMMUNITY): Payer: Medicare HMO

## 2022-03-08 ENCOUNTER — Ambulatory Visit (HOSPITAL_COMMUNITY): Payer: Medicare HMO

## 2022-03-08 ENCOUNTER — Other Ambulatory Visit (HOSPITAL_COMMUNITY): Payer: Medicare HMO

## 2022-03-09 ENCOUNTER — Ambulatory Visit (HOSPITAL_COMMUNITY): Payer: Medicare HMO

## 2022-03-13 ENCOUNTER — Encounter (HOSPITAL_COMMUNITY): Payer: Self-pay

## 2022-03-13 ENCOUNTER — Emergency Department (HOSPITAL_COMMUNITY)
Admission: EM | Admit: 2022-03-13 | Discharge: 2022-03-13 | Disposition: A | Discharge status: Home / Self Care | Payer: Medicare HMO | Attending: Student | Admitting: Student

## 2022-03-13 DIAGNOSIS — Z79899 Other long term (current) drug therapy: Secondary | ICD-10-CM | POA: Insufficient documentation

## 2022-03-13 DIAGNOSIS — Z87891 Personal history of nicotine dependence: Secondary | ICD-10-CM | POA: Insufficient documentation

## 2022-03-13 DIAGNOSIS — R42 Dizziness and giddiness: Secondary | ICD-10-CM | POA: Insufficient documentation

## 2022-03-13 DIAGNOSIS — I10 Essential (primary) hypertension: Secondary | ICD-10-CM | POA: Diagnosis not present

## 2022-03-13 DIAGNOSIS — I4891 Unspecified atrial fibrillation: Secondary | ICD-10-CM | POA: Insufficient documentation

## 2022-03-13 LAB — TYPE AND SCREEN
ABO/RH(D): B POS
Antibody Screen: NEGATIVE

## 2022-03-13 LAB — CBC
HCT: 26.4 % — ABNORMAL LOW (ref 39.0–52.0)
Hemoglobin: 8.6 g/dL — ABNORMAL LOW (ref 13.0–17.0)
MCH: 29.8 pg (ref 26.0–34.0)
MCHC: 32.6 g/dL (ref 30.0–36.0)
MCV: 91.3 fL (ref 80.0–100.0)
Platelets: 43 10*3/uL — ABNORMAL LOW (ref 150–400)
RBC: 2.89 MIL/uL — ABNORMAL LOW (ref 4.22–5.81)
RDW: 15.3 % (ref 11.5–15.5)
WBC: 9.7 10*3/uL (ref 4.0–10.5)
nRBC: 0.9 % — ABNORMAL HIGH (ref 0.0–0.2)

## 2022-03-13 LAB — COMPREHENSIVE METABOLIC PANEL
ALT: 10 U/L (ref 0–44)
AST: 12 U/L — ABNORMAL LOW (ref 15–41)
Albumin: 3.1 g/dL — ABNORMAL LOW (ref 3.5–5.0)
Alkaline Phosphatase: 59 U/L (ref 38–126)
Anion gap: 6 (ref 5–15)
BUN: 21 mg/dL (ref 8–23)
CO2: 23 mmol/L (ref 22–32)
Calcium: 8.7 mg/dL — ABNORMAL LOW (ref 8.9–10.3)
Chloride: 109 mmol/L (ref 98–111)
Creatinine, Ser: 1.12 mg/dL (ref 0.61–1.24)
GFR, Estimated: >60 mL/min (ref >60)
Glucose, Bld: 152 mg/dL — ABNORMAL HIGH (ref 70–99)
Potassium: 3.7 mmol/L (ref 3.5–5.1)
Sodium: 138 mmol/L (ref 135–145)
Total Bilirubin: 0.5 mg/dL (ref 0.3–1.2)
Total Protein: 6.3 g/dL — ABNORMAL LOW (ref 6.5–8.1)

## 2022-03-13 MED ORDER — LACTATED RINGERS IV BOLUS
1000.0000 mL | Freq: Once | INTRAVENOUS | Status: AC
  Administered 2022-03-13: 1000 mL via INTRAVENOUS

## 2022-03-13 MED ORDER — HEPARIN SOD (PORK) LOCK FLUSH 100 UNIT/ML IV SOLN
500.0000 [IU] | Freq: Once | INTRAVENOUS | Status: AC
  Administered 2022-03-13: 500 [IU]
  Filled 2022-03-13: qty 5

## 2022-03-13 NOTE — ED Notes (Signed)
Pt ambulated around nursing desk denied dizziness or weakness

## 2022-03-13 NOTE — ED Notes (Signed)
Pt ate entire frozen dinner denies feeling dizzy or nauseous

## 2022-03-13 NOTE — ED Triage Notes (Signed)
Pt states he is having headaches and dizziness since this AM. Pt states that when this happens he needs blood.   Pt endorses stomach pain and headache.

## 2022-03-13 NOTE — ED Provider Notes (Signed)
[facility] EMERGENCY DEPARTMENT Provider Note  CSN: [id number] Arrival date & time: 03/13/22 1436  Chief Complaint(s) Dizziness  HPI Dustin Tucker is a 88 y.o. 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  Patient has been seen multiple times in the emergency department for similar presentations and often requires blood transfusions for symptomatic anemia.  He states that he feels like he normally does when he needs a blood transfusion.  He also states he has been taking decreased p.o. intake because of his new chemo regimen.  He denies chest pain, shortness of breath, headache, fever or other systemic symptoms.   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 Authorizing Provider  albuterol (VENTOLIN HFA) 108 (90 Base) MCG/ACT inhaler Inhale 2 puffs into the lungs every 6 (six) hours as needed for wheezing or shortness of breath. 07/28/21   [provider]  alfuzosin (UROXATRAL) 10 MG 24 hr tablet Take 1 tablet (10 mg total) by mouth daily with breakfast. 12/08/21   Franchot Gallo, MD  azaCITIDine 5 mg/2 mLs in lactated ringers infusion  Inject 75 mg/m2 into the vein daily. Days 1-5 every 28 days 10/19/21   [provider]  cholecalciferol (VITAMIN D) 1000 units tablet Take 1,000 Units by mouth daily.    [provider]  cloNIDine (CATAPRES) 0.1 MG tablet Take 0.1 mg by mouth daily. 05/16/18   [provider]  docusate sodium (COLACE) 100 MG capsule Take 1 capsule (100 mg total) by mouth 2 (two) times daily. 12/01/21   Derek Jack, MD  hydrochlorothiazide (HYDRODIURIL) 25 MG tablet Take 25 mg by mouth daily. 06/25/16   [provider]  levocetirizine (XYZAL) 5 MG tablet Take 5 mg by mouth daily. 07/28/21   [provider]  lidocaine-prilocaine (EMLA) cream Apply a dime-sized amount to port a cath site (do not rub in) and cover with plastic wrap one hour prior to infusion appointments 10/15/21   Derek Jack, MD  lisinopril (PRINIVIL,ZESTRIL) 40 MG tablet Take 40 mg by mouth daily. 06/09/16   [provider]  megestrol (MEGACE) 400 MG/10ML suspension Take 10 mLs (400 mg total) by mouth 2 (two) times daily. 01/25/22   Derek Jack, MD  pantoprazole (PROTONIX) 40 MG tablet Take 1 tablet (40 mg total) by mouth daily. 08/09/21   Cherene Altes, MD  prochlorperazine (COMPAZINE) 10 MG tablet TAKE (1) TABLET BY MOUTH EVERY SIX HOURS AS NEEDED. 11/12/21   Derek Jack, MD  sulfamethoxazole-trimethoprim (BACTRIM DS) 800-160 MG tablet Take 1 tablet by mouth 2 (two) times daily. 02/16/22   Franchot Gallo, MD  vitamin B-12 (CYANOCOBALAMIN) 1000 MCG tablet Take 1,000 mcg by mouth daily.  [provider]                                                                                                                                    Past Surgical History Past Surgical History:  Procedure Laterality Date   CARPAL TUNNEL RELEASE Right    COLONOSCOPY N/A 09/19/2013   Procedure: COLONOSCOPY;  Surgeon: Rogene Houston, MD;  Location: [facility];  Service:  Endoscopy;  Laterality: N/A;  830   FINGER AMPUTATION Left    4th and 5th   left eye blindness Left    PORTACATH PLACEMENT Right 10/16/2021   Procedure: INSERTION PORT-A-CATH;  Surgeon: Aviva Signs, MD;  Location: [facility] ORS;  Service: General;  Laterality: Right;   SVT ABLATION N/A 09/21/2018   Procedure: SVT ABLATION;  Surgeon: Constance Haw, MD;  Location: [place] [facility];  Service: Cardiovascular;  Laterality: N/A;   Family History Family History  Problem Relation Age of Onset   CAD Mother    CAD Father    Cancer Brother     Social History Social History   Tobacco Use   Smoking status: Former    Packs/day: 0.50    Types: Cigarettes    Start date: 08/09/1956    Quit date: 08/10/1979    Years since quitting: 42.6   Smokeless tobacco: Never  Vaping Use   Vaping Use: Never used  Substance Use Topics   Alcohol use: No    Alcohol/week: 0.0 standard drinks of alcohol   Drug use: No   Allergies Aspirin and Flomax [tamsulosin]  Review of Systems Review of Systems  Neurological:  Positive for dizziness.    Physical Exam Vital Signs  I have reviewed the triage vital signs BP 133/70   Pulse 85   Temp 98.4 F (36.9 C) (Oral)   Resp (!) 21   Ht 5' 8.5" (1.74 m)   Wt 67.8 kg   SpO2 98%   BMI 22.40 kg/m   Physical Exam Constitutional:      General: He is not in acute distress.    Appearance: Normal appearance.  HENT:     Head: Normocephalic and atraumatic.     Nose: No congestion or rhinorrhea.  Eyes:     General:        Right eye: No discharge.        Left eye: No discharge.     Extraocular Movements: Extraocular movements intact.     Pupils: Pupils are equal, round, and reactive to light.  Cardiovascular:     Rate and Rhythm: Normal rate and regular rhythm.     Heart sounds: No murmur heard. Pulmonary:     Effort: No respiratory distress.     Breath sounds: No wheezing or rales.  Abdominal:     General: There is no distension.     Tenderness:  There is no abdominal tenderness.  Musculoskeletal:  General: Normal range of motion.     Cervical back: Normal range of motion.  Skin:    General: Skin is warm and dry.  Neurological:     General: No focal deficit present.     Mental Status: He is alert.     ED Results and Treatments Labs (all labs ordered are listed, but only abnormal results are displayed) Labs Reviewed  COMPREHENSIVE METABOLIC PANEL - Abnormal; Notable for the following components:      Result Value   Glucose, Bld 152 (*)    Calcium 8.7 (*)    Total Protein 6.3 (*)    Albumin 3.1 (*)    AST 12 (*)    All other components within normal limits  CBC - Abnormal; Notable for the following components:   RBC 2.89 (*)    Hemoglobin 8.6 (*)    HCT 26.4 (*)    Platelets 43 (*)    nRBC 0.9 (*)    All other components within normal limits  TYPE AND SCREEN                                                                                                                          Radiology No results found.  Pertinent labs & imaging results that were available during my care of the patient were reviewed by me and considered in my medical decision making (see MDM for details).  Medications Ordered in ED Medications - No data to display                                                                                                                                   Procedures Procedures  (including critical care time)  Medical Decision Making / ED Course   This patient presents to the ED for concern of dizziness, this involves an extensive number of treatment options, and is a complaint that carries with it a high risk of complications and morbidity.  The differential diagnosis includes dehydration, orthostasis, symptomatic anemia, electrolyte abnormality  MDM: Patient seen in the emergency room for evaluation of dizziness.  Physical exam is largely unremarkable with no appreciable abdominal tenderness to  palpation.  Laboratory evaluation with a hemoglobin of 8.6 which is rather improved from his baseline and a platelet count of 43 which is at his baseline.  Given his decreased p.o. intake recently, we trialed 1 L lactated Ringer's and patient  was able to ambulate in the emergency department out difficulty or return of his dizziness.  He also tolerated a large volume of p.o. in the emergency department without significant abdominal pain.  Given that his symptoms have improved and his blood counts are reassuringly normal for him, we will not proceed with blood transfusion patient then discharged with outpatient follow-up.   Additional history obtained: 2 -External records from outside source obtained and reviewed including: Chart review including previous notes, labs, imaging, consultation notes   Lab Tests: -I ordered, reviewed, and interpreted labs.   The pertinent results include:   Labs Reviewed  COMPREHENSIVE METABOLIC PANEL - Abnormal; Notable for the following components:      Result Value   Glucose, Bld 152 (*)    Calcium 8.7 (*)    Total Protein 6.3 (*)    Albumin 3.1 (*)    AST 12 (*)    All other components within normal limits  CBC - Abnormal; Notable for the following components:   RBC 2.89 (*)    Hemoglobin 8.6 (*)    HCT 26.4 (*)    Platelets 43 (*)    nRBC 0.9 (*)    All other components within normal limits  TYPE AND SCREEN    Medicines ordered and prescription drug management: No orders of the defined types were placed in this encounter.   -I have reviewed the patients home medicines and have made adjustments as needed  Critical interventions none    Cardiac Monitoring: The patient was maintained on a cardiac monitor.  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     Final Clinical Impression(s) / ED Diagnoses Final diagnoses:  None     '@PCDICTATION'$ @    Teressa Lower, MD 03/14/22 1459

## 2022-03-15 ENCOUNTER — Inpatient Hospital Stay: Payer: Medicare HMO | Attending: Hematology

## 2022-03-15 ENCOUNTER — Inpatient Hospital Stay: Payer: Medicare HMO

## 2022-03-15 ENCOUNTER — Encounter (HOSPITAL_COMMUNITY): Payer: Self-pay | Admitting: Hematology

## 2022-03-15 ENCOUNTER — Inpatient Hospital Stay: Payer: Medicare HMO | Admitting: Dietician

## 2022-03-15 ENCOUNTER — Inpatient Hospital Stay (HOSPITAL_BASED_OUTPATIENT_CLINIC_OR_DEPARTMENT_OTHER): Payer: Medicare HMO | Admitting: Hematology

## 2022-03-15 VITALS — BP 152/84 | HR 81 | Temp 97.9°F | Resp 18

## 2022-03-15 DIAGNOSIS — Z800 Family history of malignant neoplasm of digestive organs: Secondary | ICD-10-CM | POA: Insufficient documentation

## 2022-03-15 DIAGNOSIS — Z87891 Personal history of nicotine dependence: Secondary | ICD-10-CM | POA: Diagnosis not present

## 2022-03-15 DIAGNOSIS — C9310 Chronic myelomonocytic leukemia not having achieved remission: Secondary | ICD-10-CM | POA: Diagnosis not present

## 2022-03-15 DIAGNOSIS — R600 Localized edema: Secondary | ICD-10-CM | POA: Diagnosis not present

## 2022-03-15 DIAGNOSIS — R635 Abnormal weight gain: Secondary | ICD-10-CM | POA: Insufficient documentation

## 2022-03-15 DIAGNOSIS — I10 Essential (primary) hypertension: Secondary | ICD-10-CM | POA: Insufficient documentation

## 2022-03-15 DIAGNOSIS — H5462 Unqualified visual loss, left eye, normal vision right eye: Secondary | ICD-10-CM | POA: Diagnosis not present

## 2022-03-15 DIAGNOSIS — Z79899 Other long term (current) drug therapy: Secondary | ICD-10-CM | POA: Insufficient documentation

## 2022-03-15 DIAGNOSIS — Z5111 Encounter for antineoplastic chemotherapy: Secondary | ICD-10-CM | POA: Diagnosis present

## 2022-03-15 DIAGNOSIS — M7989 Other specified soft tissue disorders: Secondary | ICD-10-CM | POA: Insufficient documentation

## 2022-03-15 DIAGNOSIS — D469 Myelodysplastic syndrome, unspecified: Secondary | ICD-10-CM

## 2022-03-15 DIAGNOSIS — D696 Thrombocytopenia, unspecified: Secondary | ICD-10-CM | POA: Diagnosis not present

## 2022-03-15 LAB — CBC WITH DIFFERENTIAL/PLATELET
Abs Immature Granulocytes: 0.3 10*3/uL — ABNORMAL HIGH (ref 0.00–0.07)
Basophils Absolute: 0.2 10*3/uL — ABNORMAL HIGH (ref 0.0–0.1)
Basophils Relative: 3 %
Blasts: 1 %
Eosinophils Absolute: 0.2 10*3/uL (ref 0.0–0.5)
Eosinophils Relative: 3 %
HCT: 22.6 % — ABNORMAL LOW (ref 39.0–52.0)
Hemoglobin: 7.3 g/dL — ABNORMAL LOW (ref 13.0–17.0)
Lymphocytes Relative: 38 %
Lymphs Abs: 3.2 10*3/uL (ref 0.7–4.0)
MCH: 29.6 pg (ref 26.0–34.0)
MCHC: 32.3 g/dL (ref 30.0–36.0)
MCV: 91.5 fL (ref 80.0–100.0)
Metamyelocytes Relative: 3 %
Monocytes Absolute: 1.4 10*3/uL — ABNORMAL HIGH (ref 0.1–1.0)
Monocytes Relative: 17 %
Myelocytes: 1 %
Neutro Abs: 2.8 10*3/uL (ref 1.7–7.7)
Neutrophils Relative %: 34 %
Platelets: 49 10*3/uL — ABNORMAL LOW (ref 150–400)
RBC: 2.47 MIL/uL — ABNORMAL LOW (ref 4.22–5.81)
RDW: 15.3 % (ref 11.5–15.5)
WBC: 8.3 10*3/uL (ref 4.0–10.5)
nRBC: 1 % — ABNORMAL HIGH (ref 0.0–0.2)

## 2022-03-15 LAB — LACTATE DEHYDROGENASE: LDH: 164 U/L (ref 98–192)

## 2022-03-15 LAB — COMPREHENSIVE METABOLIC PANEL
ALT: 10 U/L (ref 0–44)
AST: 12 U/L — ABNORMAL LOW (ref 15–41)
Albumin: 2.8 g/dL — ABNORMAL LOW (ref 3.5–5.0)
Alkaline Phosphatase: 54 U/L (ref 38–126)
Anion gap: 5 (ref 5–15)
BUN: 21 mg/dL (ref 8–23)
CO2: 23 mmol/L (ref 22–32)
Calcium: 8.5 mg/dL — ABNORMAL LOW (ref 8.9–10.3)
Chloride: 111 mmol/L (ref 98–111)
Creatinine, Ser: 1.08 mg/dL (ref 0.61–1.24)
GFR, Estimated: >60 mL/min (ref >60)
Glucose, Bld: 122 mg/dL — ABNORMAL HIGH (ref 70–99)
Potassium: 3.7 mmol/L (ref 3.5–5.1)
Sodium: 139 mmol/L (ref 135–145)
Total Bilirubin: 0.4 mg/dL (ref 0.3–1.2)
Total Protein: 6 g/dL — ABNORMAL LOW (ref 6.5–8.1)

## 2022-03-15 LAB — MAGNESIUM: Magnesium: 2 mg/dL (ref 1.7–2.4)

## 2022-03-15 LAB — PREPARE RBC (CROSSMATCH)

## 2022-03-15 MED ORDER — FUROSEMIDE 20 MG PO TABS: 20.0000 mg | ORAL_TABLET | Freq: Every day | ORAL | 2 refills | Status: DC | PRN

## 2022-03-15 MED ORDER — HEPARIN SOD (PORK) LOCK FLUSH 100 UNIT/ML IV SOLN
500.0000 [IU] | Freq: Every day | INTRAVENOUS | Status: AC | PRN
  Administered 2022-03-15: 500 [IU]

## 2022-03-15 MED ORDER — DIPHENHYDRAMINE HCL 25 MG PO CAPS
25.0000 mg | ORAL_CAPSULE | Freq: Once | ORAL | Status: AC
  Administered 2022-03-15: 25 mg via ORAL
  Filled 2022-03-15: qty 1

## 2022-03-15 MED ORDER — SODIUM CHLORIDE 0.9% IV SOLUTION
250.0000 mL | Freq: Once | INTRAVENOUS | Status: AC
  Administered 2022-03-15: 250 mL via INTRAVENOUS

## 2022-03-15 MED ORDER — SODIUM CHLORIDE 0.9% FLUSH
10.0000 mL | INTRAVENOUS | Status: AC | PRN
  Administered 2022-03-15: 10 mL

## 2022-03-15 MED ORDER — ACETAMINOPHEN 325 MG PO TABS
650.0000 mg | ORAL_TABLET | Freq: Once | ORAL | Status: AC
  Administered 2022-03-15: 650 mg via ORAL
  Filled 2022-03-15: qty 2

## 2022-03-15 NOTE — Progress Notes (Signed)
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  INTERVAL HISTORY:  [name], a 88 y.o. male, returns for follow-up and possible next cycle of azacitidine.  He has gained 12 pounds and noticed his feet are swollen for the last 1 month.  He is drinking ensures 2 to 3 cans/day.  Denies any fevers, infections.  No chills reported.  REVIEW OF SYSTEMS:  Review of Systems  Constitutional:  Negative for appetite change, fatigue and unexpected weight change.  HENT:   Positive for trouble swallowing.   Respiratory:  Positive for cough. Negative for shortness of breath.   Cardiovascular:  Positive for leg swelling.  Gastrointestinal:  Negative for abdominal pain, constipation, diarrhea, nausea and vomiting.  Neurological:  Positive for dizziness.  All other systems reviewed and are negative.   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   Hypertension    Port-A-Cath in place 10/15/2021   Past Surgical History:  Procedure Laterality Date   CARPAL TUNNEL RELEASE Right    COLONOSCOPY N/A 09/19/2013   Procedure: COLONOSCOPY;  Surgeon: Rogene Houston, MD;  Location: [facility];  Service: Endoscopy;  Laterality: N/A;  830   FINGER AMPUTATION Left    4th and 5th   left eye blindness Left    PORTACATH PLACEMENT Right 10/16/2021   Procedure: INSERTION PORT-A-CATH;  Surgeon: Aviva Signs, MD;  Location: [facility];  Service: General;  Laterality: Right;   SVT ABLATION N/A 09/21/2018   Procedure: SVT ABLATION;  Surgeon: Constance Haw, MD;  Location: [facility];  Service: Cardiovascular;  Laterality: N/A;    SOCIAL HISTORY:  Social History   Socioeconomic History   Marital status: Widowed    Spouse name: Not on file   Number of children: Not on file   Years of education: Not on file   Highest education level: Not on file  Occupational History   Not on file  Tobacco Use   Smoking status: Former    Packs/day: 0.50    Types: Cigarettes    Start date: 08/09/1956    Quit date: 08/10/1979    Years since quitting: 42.6   Smokeless tobacco: Never  Vaping Use   Vaping Use: Never used  Substance and Sexual Activity   Alcohol use: No    Alcohol/week: 0.0 standard drinks of alcohol   Drug use: No   Sexual activity: Not Currently  Other Topics Concern   Not on file  Social History Narrative   Not on file   Social Determinants of Health   Financial Resource Strain: Not on file  Food Insecurity: Not  on file  Transportation Needs: Not on file  Physical Activity: Not on file  Stress: Not on file  Social Connections: Not on file  Intimate Partner Violence: Not on file    FAMILY HISTORY:  Family History  Problem Relation Age of Onset   CAD Mother    CAD Father    Cancer Brother     CURRENT MEDICATIONS:  Current Outpatient Medications  Medication Sig Dispense Refill   albuterol (VENTOLIN HFA) 108 (90 Base) MCG/ACT inhaler Inhale 2 puffs into the lungs every 6 (six) hours as needed for wheezing or shortness of breath.     alfuzosin (UROXATRAL) 10 MG 24 hr tablet Take 1 tablet (10 mg total) by mouth daily with  breakfast. 30 tablet 11   azaCITIDine 5 mg/2 mLs in lactated ringers infusion Inject 75 mg/m2 into the vein daily. Days 1-5 every 28 days     cholecalciferol (VITAMIN D) 1000 units tablet Take 1,000 Units by mouth daily.     cloNIDine (CATAPRES) 0.1 MG tablet Take 0.1 mg by mouth daily.     docusate sodium (COLACE) 100 MG capsule Take 1 capsule (100 mg total) by mouth 2 (two) times daily. 60 capsule 1   furosemide (LASIX) 20 MG tablet Take 1 tablet (20 mg total) by mouth daily as needed (take as needed every morning for ankle swelling). 30 tablet 2   hydrochlorothiazide (HYDRODIURIL) 25 MG tablet Take 25 mg by mouth daily.     levocetirizine (XYZAL) 5 MG tablet Take 5 mg by mouth daily.     lidocaine-prilocaine (EMLA) cream Apply a dime-sized amount to port a cath site (do not rub in) and cover with plastic wrap one hour prior to infusion appointments 30 g 3   lisinopril (PRINIVIL,ZESTRIL) 40 MG tablet Take 40 mg by mouth daily.     megestrol (MEGACE) 400 MG/10ML suspension Take 10 mLs (400 mg total) by mouth 2 (two) times daily. 480 mL 3   pantoprazole (PROTONIX) 40 MG tablet Take 1 tablet (40 mg total) by mouth daily. 30 tablet 1   prochlorperazine (COMPAZINE) 10 MG tablet TAKE (1) TABLET BY MOUTH EVERY SIX HOURS AS NEEDED. 60 tablet 3   sulfamethoxazole-trimethoprim (BACTRIM DS) 800-160 MG tablet Take 1 tablet by mouth 2 (two) times daily. 10 tablet 0   vitamin B-12 (CYANOCOBALAMIN) 1000 MCG tablet Take 1,000 mcg by mouth daily.     No current facility-administered medications for this visit.   Facility-Administered Medications Ordered in Other Visits  Medication Dose Route Frequency Provider Last Rate Last Admin   0.9 %  sodium chloride infusion (Manually program via Guardrails IV Fluids)  250 mL Intravenous Once Derek Jack, MD        ALLERGIES:  Allergies  Allergen Reactions   Aspirin Other (See Comments)    Gi upset, can tolerate baby aspirin   Flomax [Tamsulosin] Nausea  Only    PHYSICAL EXAM:  Performance status (ECOG): 1 - Symptomatic but completely ambulatory  There were no vitals filed for this visit.  Wt Readings from Last 3 Encounters:  03/15/22 153 lb (69.4 kg)  03/13/22 149 lb 8 oz (67.8 kg)  03/01/22 141 lb 14.4 oz (64.4 kg)   Physical Exam Vitals reviewed.  Constitutional:      Appearance: Normal appearance.  Cardiovascular:     Rate and Rhythm: Normal rate and regular rhythm.     Pulses: Normal pulses.     Heart sounds: Normal heart sounds.  Pulmonary:     Effort:  Pulmonary effort is normal.     Breath sounds: Normal breath sounds.  Abdominal:     Palpations: Abdomen is soft. There is no mass.     Tenderness: There is no abdominal tenderness.  Musculoskeletal:     Right lower leg: 1+ Edema present.     Left lower leg: 1+ Edema present.  Neurological:     General: No focal deficit present.     Mental Status: He is alert and oriented to person, place, and time.  Psychiatric:        Mood and Affect: Mood normal.        Behavior: Behavior normal.     LABORATORY DATA:  I have reviewed the labs as listed.     Latest Ref Rng & Units 03/15/2022   10:13 AM 03/13/2022    3:25 PM 03/01/2022    9:52 AM  CBC  WBC 4.0 - 10.5 K/uL 8.3  9.7  8.0   Hemoglobin 13.0 - 17.0 g/dL 7.3  8.6  6.8   Hematocrit 39.0 - 52.0 % 22.6  26.4  20.7   Platelets 150 - 400 K/uL 49  43  46       Latest Ref Rng & Units 03/15/2022   10:13 AM 03/13/2022    3:25 PM 03/01/2022    9:52 AM  CMP  Glucose 70 - 99 mg/dL 122  152  121   BUN 8 - 23 mg/dL _0 Creatinine 0.61 - 1.24 mg/dL 1.08  1.12  1.14   Sodium 135 - 145 mmol/L 139  138  136   Potassium 3.5 - 5.1 mmol/L 3.7  3.7  3.6   Chloride 98 - 111 mmol/L 111  109  110   CO2 22 - 32 mmol/L _1 Calcium 8.9 - 10.3 mg/dL 8.5  8.7  8.5   Total Protein 6.5 - 8.1 g/dL 6.0  6.3  6.3   Total Bilirubin 0.3 - 1.2 mg/dL 0.4  0.5  0.7   Alkaline Phos 38 - 126 U/L 54  59  55   AST 15 - 41 U/L _2 ALT 0 - 44 U/L _3 DIAGNOSTIC IMAGING:  I have independently reviewed the scans and discussed with the patient. CT Biopsy  Result Date: 02/25/2022 INDICATION: MDS, assess for treatment response EXAM: CT GUIDED RIGHT ILIAC BONE MARROW ASPIRATION AND CORE BIOPSY Date:  02/25/2022 02/25/2022 10:02 am Radiologist:  M. Daryll Brod, MD Guidance:  CT FLUOROSCOPY: Fluoroscopy Time: None. MEDICATIONS: 1% lidocaine local ANESTHESIA/SEDATION: 1.0 mg IV Versed; 50 mcg IV Fentanyl Moderate Sedation Time:  10 minute The patient was continuously monitored during the procedure by the interventional radiology nurse under my direct supervision. CONTRAST:  None COMPLICATIONS: None PROCEDURE: Informed consent was obtained from the patient following explanation of the procedure, risks, benefits and alternatives. The patient understands, agrees and consents for the procedure. All questions were addressed. A time out was performed. The patient was positioned prone and non-contrast localization CT was performed of the pelvis to demonstrate the iliac marrow spaces. Maximal barrier sterile technique utilized including caps, mask, sterile gowns, sterile gloves, large sterile drape, hand hygiene, and Betadine prep. Under sterile conditions and local anesthesia, an 11 gauge coaxial bone biopsy needle was advanced into the right iliac marrow space. Needle position was confirmed with CT imaging. Initially, bone marrow aspiration was performed. Next, the  11 gauge outer cannula was utilized to obtain a right iliac bone marrow core biopsy. Needle was removed. Hemostasis was obtained with compression. The patient tolerated the procedure well. Samples were prepared with the cytotechnologist. No immediate complications. IMPRESSION: CT guided right iliac bone marrow aspiration and core biopsy. Electronically Signed   By: Jerilynn Mages.  Shick M.D.   On: 02/25/2022 10:38   CT BONE MARROW BIOPSY & ASPIRATION  Result Date:  02/25/2022 INDICATION: MDS, assess for treatment response EXAM: CT GUIDED RIGHT ILIAC BONE MARROW ASPIRATION AND CORE BIOPSY Date:  02/25/2022 02/25/2022 10:02 am Radiologist:  M. Daryll Brod, MD Guidance:  CT FLUOROSCOPY: Fluoroscopy Time: None. MEDICATIONS: 1% lidocaine local ANESTHESIA/SEDATION: 1.0 mg IV Versed; 50 mcg IV Fentanyl Moderate Sedation Time:  10 minute The patient was continuously monitored during the procedure by the interventional radiology nurse under my direct supervision. CONTRAST:  None COMPLICATIONS: None PROCEDURE: Informed consent was obtained from the patient following explanation of the procedure, risks, benefits and alternatives. The patient understands, agrees and consents for the procedure. All questions were addressed. A time out was performed. The patient was positioned prone and non-contrast localization CT was performed of the pelvis to demonstrate the iliac marrow spaces. Maximal barrier sterile technique utilized including caps, mask, sterile gowns, sterile gloves, large sterile drape, hand hygiene, and Betadine prep. Under sterile conditions and local anesthesia, an 11 gauge coaxial bone biopsy needle was advanced into the right iliac marrow space. Needle position was confirmed with CT imaging. Initially, bone marrow aspiration was performed. Next, the 11 gauge outer cannula was utilized to obtain a right iliac bone marrow core biopsy. Needle was removed. Hemostasis was obtained with compression. The patient tolerated the procedure well. Samples were prepared with the cytotechnologist. No immediate complications. IMPRESSION: CT guided right iliac bone marrow aspiration and core biopsy. Electronically Signed   By: Jerilynn Mages.  Shick M.D.   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  Hemoglobin 15.5 on  09/20/2018.  Platelet count on 09/20/1998 2155. - He was on aspirin 81 mg at presentation.  He received 3 units PRBC.  Ferritin was 244 and percent saturation 37.  T06 was 269 and folic acid normal.  Creatinine was normal. - MMA, copper, folic acid and LDH was normal.  SPEP negative. - Serum EPO 140.  Kappa light chains elevated at 32.4 with ratio 1.55. - Bone marrow biopsy on 09/18/2021: Hypercellular marrow with trilineage dyspoiesis and monocytosis.  Dyspoiesis in the granulocytic precursors without apparent late maturation of neutrophils, increased monocytes but no significant blast population.  Increased megakaryocytes with widely separated nuclear lobes.  Erythroid precursors slightly decreased in number with the type ER.  Ring sideroblasts not identified. - Chromosome analysis and MDS FISH panel was normal. 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 - Based on [facility] molecular model, he has 3 points with circulating immature cells, decreased hemoglobin, decreased platelets.  This puts him in high risk, with overall survival 16 months. - Azacitidine started on 10/19/2021.  Required 2 units PRBC after cycle 2, 3 units PRBC after cycle 3. - BMBX (02/25/2022): Hypercellular marrow with 5% blasts on smear.  Blasts were not seen on flow cytometry.  MDS FISH panel was normal.  Cytogenetics was normal.    Social/family history: - Lives at home by himself.  Son lives in [place].  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  No exposure to chemicals.  Non-smoker. - Brother had colon cancer.  Another brother had?  Stomach cancer.  2 maternal uncles had cancer, type unknown to the patient.   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 - Aspirate smears show 5% blasts.  Previously no blasts were seen.  Chromosome analysis was normal. - We discussed options of increasing azacitidine to full dose.  We also have the  option of adding Revlimid 10 mg 3 weeks on/1 week off if there is no adequate response. - Reviewed labs today which showed normal LFTs.  CBC shows platelet count 49.  White count is 8.3 and hemoglobin 7.3.  He will receive 1 unit PRBC. - We will schedule him to start his next cycle on 03/22/2022. -  2.  Weight loss: - He is drinking 2 to 3 cans of Ensure per day. - He has gained 12 pounds since last visit.  However he also has 2+ edema in the legs.  3.  Leg swelling: - We will start him on Lasix 20 mg in the mornings as needed.   Orders placed this encounter:  No orders of the defined types were placed in this encounter.    Derek Jack, MD [facility] [phone]   I, Thana Ates, am acting as a scribe for Dr. Derek Jack.  I, Derek Jack MD, have reviewed the above documentation for accuracy and completeness, and I agree with the above.

## 2022-03-15 NOTE — Patient Instructions (Signed)
[facility]  Discharge Instructions: Thank you for choosing [facility] to provide your oncology and hematology care.  If you have a lab appointment with the [facility], please come in thru the Main Entrance and check in at the main information desk.  Wear comfortable clothing and clothing appropriate for easy access to any Portacath or PICC line.   We strive to give you quality time with your provider. You may need to reschedule your appointment if you arrive late (15 or more minutes).  Arriving late affects you and other patients whose appointments are after yours.  Also, if you miss three or more appointments without notifying the office, you may be dismissed from the clinic at the provider's discretion.      For prescription refill requests, have your pharmacy contact our office and allow 72 hours for refills to be completed.    Today you received the following chemotherapy and/or immunotherapy agents 1UPRBC      To help prevent nausea and vomiting after your treatment, we encourage you to take your nausea medication as directed.  BELOW ARE SYMPTOMS THAT SHOULD BE REPORTED IMMEDIATELY: *FEVER GREATER THAN 100.4 F (38 C) OR HIGHER *CHILLS OR SWEATING *NAUSEA AND VOMITING THAT IS NOT CONTROLLED WITH YOUR NAUSEA MEDICATION *UNUSUAL SHORTNESS OF BREATH *UNUSUAL BRUISING OR BLEEDING *URINARY PROBLEMS (pain or burning when urinating, or frequent urination) *BOWEL PROBLEMS (unusual diarrhea, constipation, pain near the anus) TENDERNESS IN MOUTH AND THROAT WITH OR WITHOUT PRESENCE OF ULCERS (sore throat, sores in mouth, or a toothache) UNUSUAL RASH, SWELLING OR PAIN  UNUSUAL VAGINAL DISCHARGE OR ITCHING   Items with * indicate a potential emergency and should be followed up as soon as possible or go to the Emergency Department if any problems should occur.  Please show the CHEMOTHERAPY ALERT CARD or IMMUNOTHERAPY ALERT CARD at check-in to the Emergency  Department and triage nurse.  Should you have questions after your visit or need to cancel or reschedule your appointment, please contact [facility] [phone]  and follow the prompts.  Office hours are 8:00 a.m. to 4:30 p.m. Monday - Friday. Please note that voicemails left after 4:00 p.m. may not be returned until the following business day.  We are closed weekends and major holidays. You have access to a nurse at all times for urgent questions. Please call the main number to the clinic [phone] and follow the prompts.  For any non-urgent questions, you may also contact your provider using MyChart. We now offer e-Visits for anyone 61 and older to request care online for non-urgent symptoms. For details visit [email or web address].   Also download the MyChart app! Go to the app store, search "MyChart", open the app, select [facility], and log in with your MyChart username and password.  Masks are optional in the cancer centers. If you would like for your care team to wear a mask while they are taking care of you, please let them know. For doctor visits, patients may have with them one support person who is at least 86 years old. At this time, visitors are not allowed in the infusion area.

## 2022-03-15 NOTE — Patient Instructions (Addendum)
[facility] Discharge Instructions   You were seen and examined today by Dr. Delton Coombes.  He reviewed the results of your lab work. Your hemoglobin is 7.3. We will give you 1 unit of blood today.   We will hold your treatment this week while we wait for cytogenetics report to come back to see if we need to change your treatment.   Return as scheduled next week.    Thank you for choosing [facility] at [facility] to provide your oncology and hematology care.  To afford each patient quality time with our provider, please arrive at least 15 minutes before your scheduled appointment time.   If you have a lab appointment with the [facility] please come in thru the Main Entrance and check in at the main information desk.  You need to re-schedule your appointment should you arrive 10 or more minutes late.  We strive to give you quality time with our providers, and arriving late affects you and other patients whose appointments are after yours.  Also, if you no show three or more times for appointments you may be dismissed from the clinic at the providers discretion.     Again, thank you for choosing [facility].  Our hope is that these requests will decrease the amount of time that you wait before being seen by our physicians.       _____________________________________________________________  Should you have questions after your visit to [facility], please contact our office at [phone] and follow the prompts.  Our office hours are 8:00 a.m. and 4:30 p.m. Monday - Friday.  Please note that voicemails left after 4:00 p.m. may not be returned until the following business day.  We are closed weekends and major holidays.  You do have access to a nurse 24-7, just call the main number to the clinic [phone] and do not press any options, hold on the line and a nurse will answer the phone.    For  prescription refill requests, have your pharmacy contact our office and allow 72 hours.    Due to Covid, you will need to wear a mask upon entering the hospital. If you do not have a mask, a mask will be given to you at the Main Entrance upon arrival. For doctor visits, patients may have 1 support person age 34 or older with them. For treatment visits, patients can not have anyone with them due to social distancing guidelines and our immunocompromised population.

## 2022-03-15 NOTE — Progress Notes (Signed)
Nutrition Follow-up:  Patient with chronic myelomonocytic leukemia. He is receiving azacitidine q28d (held since 7/24).   Met with patient in infusion. He reports appetite is about the same. Patient is taking appetite stimulant. He continues to endorse altered taste. Patient restarted baking soda salt water rinses before eating. This has been working well to "get bitter taste out my mouth." Patient tried Boost VHC samples. He liked these and daughter ordered them. Patient reports having one more Ensure to drink before starting on Boost. Patient recalls baked beans, stuffed pepper, apple pie, 2 Ensure Complete yesterday. He endorses lower extremity swelling today. Patient will start Lasix daily per MD.   Medications: lasix 20 mg (new on 8/7)  Labs: glucose 122  Anthropometrics: Weight 153 lb today increased (? Fluid)  8/5 - 149 lb 8 oz 7/24 - 141 lb 14.4 oz  6/19 - 146 lb 8 oz    NUTRITION DIAGNOSIS: Moderate malnutrition continues    INTERVENTION:  Continue baking soda salt water rinses several times daily before meals Continue appetite stimulant per MD Pt to start Boost VHC (530 kcal, 23 g protein 8/8) recommend 2/day    MONITORING, EVALUATION, GOAL: weight trends, oral intake   NEXT VISIT: To be scheduled ~4 weeks with treatment

## 2022-03-15 NOTE — Progress Notes (Signed)
Patient presents today for Vidaza infusion per providers order.  Labs and vital signs within parameters for treatment.    Vidaza being held per MD, patient will receive 1UPRBC today per providers order.   Stable during transfusion without adverse affects.  Vital signs stable.  No complaints at this time.  Discharge from clinic ambulatory in stable condition.  Alert and oriented X 3.  Follow up with [facility] as scheduled.

## 2022-03-16 ENCOUNTER — Inpatient Hospital Stay: Payer: Medicare HMO

## 2022-03-16 LAB — TYPE AND SCREEN
ABO/RH(D): B POS
Antibody Screen: NEGATIVE
Unit division: 0

## 2022-03-16 LAB — BPAM RBC
Blood Product Expiration Date: 202308182359
ISSUE DATE / TIME: 202308071357
Unit Type and Rh: 7300

## 2022-03-17 ENCOUNTER — Ambulatory Visit (HOSPITAL_COMMUNITY): Admission: RE | Admit: 2022-03-17 | Payer: Medicare HMO | Source: Ambulatory Visit

## 2022-03-17 ENCOUNTER — Inpatient Hospital Stay: Payer: Medicare HMO

## 2022-03-18 ENCOUNTER — Inpatient Hospital Stay: Payer: Medicare HMO

## 2022-03-19 ENCOUNTER — Inpatient Hospital Stay: Payer: Medicare HMO

## 2022-03-22 ENCOUNTER — Inpatient Hospital Stay: Payer: Medicare HMO

## 2022-03-22 VITALS — BP 136/67 | HR 81 | Temp 98.9°F | Resp 18 | Wt 152.4 lb

## 2022-03-22 DIAGNOSIS — Z5111 Encounter for antineoplastic chemotherapy: Secondary | ICD-10-CM | POA: Diagnosis not present

## 2022-03-22 DIAGNOSIS — C9310 Chronic myelomonocytic leukemia not having achieved remission: Secondary | ICD-10-CM

## 2022-03-22 DIAGNOSIS — Z95828 Presence of other vascular implants and grafts: Secondary | ICD-10-CM

## 2022-03-22 DIAGNOSIS — D469 Myelodysplastic syndrome, unspecified: Secondary | ICD-10-CM

## 2022-03-22 LAB — COMPREHENSIVE METABOLIC PANEL
ALT: 12 U/L (ref 0–44)
AST: 14 U/L — ABNORMAL LOW (ref 15–41)
Albumin: 3 g/dL — ABNORMAL LOW (ref 3.5–5.0)
Alkaline Phosphatase: 59 U/L (ref 38–126)
Anion gap: 6 (ref 5–15)
BUN: 24 mg/dL — ABNORMAL HIGH (ref 8–23)
CO2: 21 mmol/L — ABNORMAL LOW (ref 22–32)
Calcium: 8.7 mg/dL — ABNORMAL LOW (ref 8.9–10.3)
Chloride: 111 mmol/L (ref 98–111)
Creatinine, Ser: 1.04 mg/dL (ref 0.61–1.24)
GFR, Estimated: >60 mL/min (ref >60)
Glucose, Bld: 126 mg/dL — ABNORMAL HIGH (ref 70–99)
Potassium: 3.6 mmol/L (ref 3.5–5.1)
Sodium: 138 mmol/L (ref 135–145)
Total Bilirubin: 0.4 mg/dL (ref 0.3–1.2)
Total Protein: 6.3 g/dL — ABNORMAL LOW (ref 6.5–8.1)

## 2022-03-22 LAB — CBC WITH DIFFERENTIAL/PLATELET
Abs Immature Granulocytes: 1 10*3/uL — ABNORMAL HIGH (ref 0.00–0.07)
Basophils Absolute: 0 10*3/uL (ref 0.0–0.1)
Basophils Relative: 0 %
Eosinophils Absolute: 0 10*3/uL (ref 0.0–0.5)
Eosinophils Relative: 0 %
HCT: 22.8 % — ABNORMAL LOW (ref 39.0–52.0)
Hemoglobin: 7.6 g/dL — ABNORMAL LOW (ref 13.0–17.0)
Lymphocytes Relative: 37 %
Lymphs Abs: 6.1 10*3/uL — ABNORMAL HIGH (ref 0.7–4.0)
MCH: 29.5 pg (ref 26.0–34.0)
MCHC: 33.3 g/dL (ref 30.0–36.0)
MCV: 88.4 fL (ref 80.0–100.0)
Metamyelocytes Relative: 5 %
Monocytes Absolute: 4.6 10*3/uL — ABNORMAL HIGH (ref 0.1–1.0)
Monocytes Relative: 28 %
Myelocytes: 1 %
Neutro Abs: 4.8 10*3/uL (ref 1.7–7.7)
Neutrophils Relative %: 29 %
Platelets: 55 10*3/uL — ABNORMAL LOW (ref 150–400)
RBC: 2.58 MIL/uL — ABNORMAL LOW (ref 4.22–5.81)
RDW: 16.1 % — ABNORMAL HIGH (ref 11.5–15.5)
WBC: 16.5 10*3/uL — ABNORMAL HIGH (ref 4.0–10.5)
nRBC: 0.4 % — ABNORMAL HIGH (ref 0.0–0.2)

## 2022-03-22 LAB — SAMPLE TO BLOOD BANK

## 2022-03-22 LAB — MAGNESIUM: Magnesium: 2.1 mg/dL (ref 1.7–2.4)

## 2022-03-22 LAB — PREPARE RBC (CROSSMATCH)

## 2022-03-22 MED ORDER — HEPARIN SOD (PORK) LOCK FLUSH 100 UNIT/ML IV SOLN
500.0000 [IU] | Freq: Every day | INTRAVENOUS | Status: AC | PRN
  Administered 2022-03-22: 500 [IU]

## 2022-03-22 MED ORDER — DIPHENHYDRAMINE HCL 25 MG PO CAPS
ORAL_CAPSULE | ORAL | Status: AC
  Filled 2022-03-22: qty 1

## 2022-03-22 MED ORDER — PALONOSETRON HCL INJECTION 0.25 MG/5ML
0.2500 mg | Freq: Once | INTRAVENOUS | Status: AC
  Administered 2022-03-22: 0.25 mg via INTRAVENOUS

## 2022-03-22 MED ORDER — ACETAMINOPHEN 325 MG PO TABS
ORAL_TABLET | ORAL | Status: AC
  Filled 2022-03-22: qty 2

## 2022-03-22 MED ORDER — SODIUM CHLORIDE 0.9% FLUSH: 10.0000 mL | INTRAVENOUS | Status: DC | PRN

## 2022-03-22 MED ORDER — SODIUM CHLORIDE 0.9 % IV SOLN
75.0000 mg/m2 | Freq: Once | INTRAVENOUS | Status: AC
  Administered 2022-03-22: 140 mg via INTRAVENOUS
  Filled 2022-03-22: qty 14

## 2022-03-22 MED ORDER — SODIUM CHLORIDE 0.9% IV SOLUTION
250.0000 mL | Freq: Once | INTRAVENOUS | Status: AC
  Administered 2022-03-22: 250 mL via INTRAVENOUS

## 2022-03-22 MED ORDER — PALONOSETRON HCL INJECTION 0.25 MG/5ML
INTRAVENOUS | Status: AC
  Filled 2022-03-22: qty 5

## 2022-03-22 MED ORDER — DIPHENHYDRAMINE HCL 25 MG PO CAPS
25.0000 mg | ORAL_CAPSULE | Freq: Once | ORAL | Status: AC
  Administered 2022-03-22: 25 mg via ORAL

## 2022-03-22 MED ORDER — SODIUM CHLORIDE 0.9 % IV SOLN
10.0000 mg | Freq: Once | INTRAVENOUS | Status: AC
  Administered 2022-03-22: 10 mg via INTRAVENOUS
  Filled 2022-03-22: qty 10

## 2022-03-22 MED ORDER — HEPARIN SOD (PORK) LOCK FLUSH 100 UNIT/ML IV SOLN: 500.0000 [IU] | Freq: Once | INTRAVENOUS | Status: DC | PRN

## 2022-03-22 MED ORDER — SODIUM CHLORIDE 0.9% FLUSH
10.0000 mL | INTRAVENOUS | Status: AC | PRN
  Administered 2022-03-22: 10 mL

## 2022-03-22 MED ORDER — SODIUM CHLORIDE 0.9 % IV SOLN: Freq: Once | INTRAVENOUS | Status: AC

## 2022-03-22 MED ORDER — ACETAMINOPHEN 325 MG PO TABS
650.0000 mg | ORAL_TABLET | Freq: Once | ORAL | Status: AC
  Administered 2022-03-22: 650 mg via ORAL

## 2022-03-22 NOTE — Progress Notes (Signed)
Patient presents today for possible treatment of Vidaza. WBC 16.5, ANC 4.8, Hgb 7.6. Dr. Delton Coombes made aware of labs. Per Dr. Delton Coombes patient okay for treatment today at '75mg'$ /m2, give blood if symptomatic. Patient reports weakness, 1 unit of blood ordered for patient.  Patient tolerated chemotherapy with no complaints voiced. Side effects with management reviewed understanding verbalized. Port site clean and dry with no bruising or swelling noted at site. Good blood return noted before and after administration of chemotherapy.  Patient tolerated 1 unit of PRBCs with no complaints voiced. Side effects with management reviewed with understanding verbalized. Port site clean and dry with no bruising or swelling noted at site. Good blood return noted before and after administration of therapy. Band aid applied. Patient left in satisfactory condition with VSS and no s/s of distress noted.

## 2022-03-22 NOTE — Patient Instructions (Signed)
[facility]  Discharge Instructions: Thank you for choosing [facility] to provide your oncology and hematology care.  If you have a lab appointment with the [facility], please come in thru the Main Entrance and check in at the main information desk.  Wear comfortable clothing and clothing appropriate for easy access to any Portacath or PICC line.   We strive to give you quality time with your provider. You may need to reschedule your appointment if you arrive late (15 or more minutes).  Arriving late affects you and other patients whose appointments are after yours.  Also, if you miss three or more appointments without notifying the office, you may be dismissed from the clinic at the provider's discretion.      For prescription refill requests, have your pharmacy contact our office and allow 72 hours for refills to be completed.    Today you received the following chemotherapy and/or immunotherapy agents Vidaza and 1 unit of blood, return as scheduled.   To help prevent nausea and vomiting after your treatment, we encourage you to take your nausea medication as directed.  BELOW ARE SYMPTOMS THAT SHOULD BE REPORTED IMMEDIATELY: *FEVER GREATER THAN 100.4 F (38 C) OR HIGHER *CHILLS OR SWEATING *NAUSEA AND VOMITING THAT IS NOT CONTROLLED WITH YOUR NAUSEA MEDICATION *UNUSUAL SHORTNESS OF BREATH *UNUSUAL BRUISING OR BLEEDING *URINARY PROBLEMS (pain or burning when urinating, or frequent urination) *BOWEL PROBLEMS (unusual diarrhea, constipation, pain near the anus) TENDERNESS IN MOUTH AND THROAT WITH OR WITHOUT PRESENCE OF ULCERS (sore throat, sores in mouth, or a toothache) UNUSUAL RASH, SWELLING OR PAIN  UNUSUAL VAGINAL DISCHARGE OR ITCHING   Items with * indicate a potential emergency and should be followed up as soon as possible or go to the Emergency Department if any problems should occur.  Please show the CHEMOTHERAPY ALERT CARD or IMMUNOTHERAPY  ALERT CARD at check-in to the Emergency Department and triage nurse.  Should you have questions after your visit or need to cancel or reschedule your appointment, please contact [facility] [phone]  and follow the prompts.  Office hours are 8:00 a.m. to 4:30 p.m. Monday - Friday. Please note that voicemails left after 4:00 p.m. may not be returned until the following business day.  We are closed weekends and major holidays. You have access to a nurse at all times for urgent questions. Please call the main number to the clinic [phone] and follow the prompts.  For any non-urgent questions, you may also contact your provider using MyChart. We now offer e-Visits for anyone 53 and older to request care online for non-urgent symptoms. For details visit [email or web address].   Also download the MyChart app! Go to the app store, search "MyChart", open the app, select [facility], and log in with your MyChart username and password.  Masks are optional in the cancer centers. If you would like for your care team to wear a mask while they are taking care of you, please let them know. For doctor visits, patients may have with them one support person who is at least 86 years old. At this time, visitors are not allowed in the infusion area.

## 2022-03-23 ENCOUNTER — Inpatient Hospital Stay: Payer: Medicare HMO

## 2022-03-23 ENCOUNTER — Inpatient Hospital Stay (HOSPITAL_BASED_OUTPATIENT_CLINIC_OR_DEPARTMENT_OTHER): Payer: Medicare HMO | Admitting: Hematology

## 2022-03-23 ENCOUNTER — Inpatient Hospital Stay: Payer: Medicare HMO | Admitting: Hematology

## 2022-03-23 VITALS — BP 145/73 | HR 110 | Temp 98.9°F | Resp 19 | Wt 153.4 lb

## 2022-03-23 VITALS — BP 110/47 | HR 77 | Temp 98.2°F | Resp 18

## 2022-03-23 DIAGNOSIS — Z5111 Encounter for antineoplastic chemotherapy: Secondary | ICD-10-CM | POA: Diagnosis not present

## 2022-03-23 DIAGNOSIS — C9310 Chronic myelomonocytic leukemia not having achieved remission: Secondary | ICD-10-CM

## 2022-03-23 DIAGNOSIS — Z95828 Presence of other vascular implants and grafts: Secondary | ICD-10-CM

## 2022-03-23 LAB — BPAM RBC
Blood Product Expiration Date: 202309072359
ISSUE DATE / TIME: 202308141222
Unit Type and Rh: 1700

## 2022-03-23 LAB — TYPE AND SCREEN
ABO/RH(D): B POS
Antibody Screen: NEGATIVE
Unit division: 0

## 2022-03-23 MED ORDER — SODIUM CHLORIDE 0.9 % IV SOLN
75.0000 mg/m2 | Freq: Once | INTRAVENOUS | Status: AC
  Administered 2022-03-23: 140 mg via INTRAVENOUS
  Filled 2022-03-23: qty 14

## 2022-03-23 MED ORDER — SODIUM CHLORIDE 0.9 % IV SOLN: Freq: Once | INTRAVENOUS | Status: AC

## 2022-03-23 MED ORDER — SODIUM CHLORIDE 0.9 % IV SOLN
10.0000 mg | Freq: Once | INTRAVENOUS | Status: AC
  Administered 2022-03-23: 10 mg via INTRAVENOUS
  Filled 2022-03-23: qty 10

## 2022-03-23 MED ORDER — HEPARIN SOD (PORK) LOCK FLUSH 100 UNIT/ML IV SOLN
500.0000 [IU] | Freq: Once | INTRAVENOUS | Status: AC | PRN
  Administered 2022-03-23: 500 [IU]

## 2022-03-23 MED ORDER — SODIUM CHLORIDE 0.9% FLUSH
10.0000 mL | INTRAVENOUS | Status: DC | PRN
  Administered 2022-03-23 (×2): 10 mL

## 2022-03-23 NOTE — Patient Instructions (Signed)
[facility]  Discharge Instructions: Thank you for choosing [facility] to provide your oncology and hematology care.  If you have a lab appointment with the Cancer Center, please come in thru the Main Entrance and check in at the main information desk.  Wear comfortable clothing and clothing appropriate for easy access to any Portacath or PICC line.   We strive to give you quality time with your provider. You may need to reschedule your appointment if you arrive late (15 or more minutes).  Arriving late affects you and other patients whose appointments are after yours.  Also, if you miss three or more appointments without notifying the office, you may be dismissed from the clinic at the provider's discretion.      For prescription refill requests, have your pharmacy contact our office and allow 72 hours for refills to be completed.    Today you received the following chemotherapy and/or immunotherapy agents Vidaza, return as scheduled.   To help prevent nausea and vomiting after your treatment, we encourage you to take your nausea medication as directed.  BELOW ARE SYMPTOMS THAT SHOULD BE REPORTED IMMEDIATELY: *FEVER GREATER THAN 100.4 F (38 C) OR HIGHER *CHILLS OR SWEATING *NAUSEA AND VOMITING THAT IS NOT CONTROLLED WITH YOUR NAUSEA MEDICATION *UNUSUAL SHORTNESS OF BREATH *UNUSUAL BRUISING OR BLEEDING *URINARY PROBLEMS (pain or burning when urinating, or frequent urination) *BOWEL PROBLEMS (unusual diarrhea, constipation, pain near the anus) TENDERNESS IN MOUTH AND THROAT WITH OR WITHOUT PRESENCE OF ULCERS (sore throat, sores in mouth, or a toothache) UNUSUAL RASH, SWELLING OR PAIN  UNUSUAL VAGINAL DISCHARGE OR ITCHING   Items with * indicate a potential emergency and should be followed up as soon as possible or go to the Emergency Department if any problems should occur.  Please show the CHEMOTHERAPY ALERT CARD or IMMUNOTHERAPY ALERT CARD at  check-in to the Emergency Department and triage nurse.  Should you have questions after your visit or need to cancel or reschedule your appointment, please contact [facility] AT  [phone]  and follow the prompts.  Office hours are 8:00 a.m. to 4:30 p.m. Monday - Friday. Please note that voicemails left after 4:00 p.m. may not be returned until the following business day.  We are closed weekends and major holidays. You have access to a nurse at all times for urgent questions. Please call the main number to the clinic [phone] and follow the prompts.  For any non-urgent questions, you may also contact your provider using MyChart. We now offer e-Visits for anyone 18 and older to request care online for non-urgent symptoms. For details visit [email or web address].   Also download the MyChart app! Go to the app store, search "MyChart", open the app, select [facility], and log in with your MyChart username and password.  Masks are optional in the cancer centers. If you would like for your care team to wear a mask while they are taking care of you, please let them know. For doctor visits, patients may have with them one support person who is at least 86 years old. At this time, visitors are not allowed in the infusion area.  

## 2022-03-23 NOTE — Progress Notes (Signed)
Patient tolerated chemotherapy with no complaints voiced. Side effects with management reviewed understanding verbalized. Port site clean and dry with no bruising or swelling noted at site. Good blood return noted before and after administration of chemotherapy.  Patient left in satisfactory condition with VSS and no s/s of distress noted. 

## 2022-03-23 NOTE — Progress Notes (Signed)
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  INTERVAL HISTORY:  [name], a 88 y.o. male, r seen for follow-up of CMML and possible neck cycle of azacitidine.  He reports chronic cough and dizziness which have been stable.  Energy levels are 30% and appetite is 80%.  Denies any fevers or infections.  REVIEW OF SYSTEMS:  Review of Systems  Constitutional:  Negative for appetite change, fatigue and unexpected weight change.  HENT:   Negative for trouble swallowing.   Respiratory:  Positive for cough. Negative for shortness of breath.   Cardiovascular:  Negative for leg swelling.  Gastrointestinal:  Negative for abdominal pain, constipation, diarrhea, nausea and vomiting.  Neurological:  Positive for dizziness.  All other systems reviewed and are negative.   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   Hypertension    Port-A-Cath in place 10/15/2021   Past Surgical History:  Procedure Laterality Date   CARPAL TUNNEL RELEASE Right    COLONOSCOPY N/A 09/19/2013   Procedure: COLONOSCOPY;  Surgeon: Rogene Houston, MD;  [facility]  [facility];  Service: Endoscopy;  Laterality: N/A;  830   FINGER AMPUTATION Left    4th and 5th   left eye blindness Left    PORTACATH PLACEMENT Right 10/16/2021   Procedure: INSERTION PORT-A-CATH;  Surgeon: Aviva Signs, MD;  Location: [facility] ORS;  Service: General;  Laterality: Right;   SVT ABLATION N/A 09/21/2018   Procedure: SVT ABLATION;  Surgeon: Constance Haw, MD;  Location: [facility];  Service: Cardiovascular;  Laterality: N/A;    SOCIAL HISTORY:  Social History   Socioeconomic History   Marital status: Widowed    Spouse name: Not on file   Number of children: Not on file   Years of education: Not on file   Highest education level: Not on file  Occupational History   Not on file  Tobacco Use   Smoking status: Former    Packs/day: 0.50    Types: Cigarettes    Start date: 08/09/1956    Quit date: 08/10/1979    Years since quitting: 42.6   Smokeless tobacco: Never  Vaping Use   Vaping Use: Never used  Substance and Sexual Activity   Alcohol use: No    Alcohol/week: 0.0 standard drinks of alcohol   Drug use: No   Sexual activity: Not Currently  Other Topics Concern   Not on file  Social History Narrative   Not on file   Social Determinants of Health   Financial Resource Strain: Not on file  Food Insecurity: Not on file  Transportation Needs: Not  on file  Physical Activity: Not on file  Stress: Not on file  Social Connections: Not on file  Intimate Partner Violence: Not on file    FAMILY HISTORY:  Family History  Problem Relation Age of Onset   CAD Mother    CAD Father    Cancer Brother     CURRENT MEDICATIONS:  Current Outpatient Medications  Medication Sig Dispense Refill   albuterol (VENTOLIN HFA) 108 (90 Base) MCG/ACT inhaler Inhale 2 puffs into the lungs every 6 (six) hours as needed for wheezing or shortness of breath.     alfuzosin (UROXATRAL) 10 MG 24 hr tablet Take 1 tablet (10 mg total) by mouth daily with breakfast. 30 tablet 11    azaCITIDine 5 mg/2 mLs in lactated ringers infusion Inject 75 mg/m2 into the vein daily. Days 1-5 every 28 days     cholecalciferol (VITAMIN D) 1000 units tablet Take 1,000 Units by mouth daily.     cloNIDine (CATAPRES) 0.1 MG tablet Take 0.1 mg by mouth daily.     docusate sodium (COLACE) 100 MG capsule Take 1 capsule (100 mg total) by mouth 2 (two) times daily. 60 capsule 1   furosemide (LASIX) 20 MG tablet Take 1 tablet (20 mg total) by mouth daily as needed (take as needed every morning for ankle swelling). 30 tablet 2   hydrochlorothiazide (HYDRODIURIL) 25 MG tablet Take 25 mg by mouth daily.     levocetirizine (XYZAL) 5 MG tablet Take 5 mg by mouth daily.     lidocaine-prilocaine (EMLA) cream Apply a dime-sized amount to port a cath site (do not rub in) and cover with plastic wrap one hour prior to infusion appointments 30 g 3   lisinopril (PRINIVIL,ZESTRIL) 40 MG tablet Take 40 mg by mouth daily.     megestrol (MEGACE) 400 MG/10ML suspension Take 10 mLs (400 mg total) by mouth 2 (two) times daily. 480 mL 3   pantoprazole (PROTONIX) 40 MG tablet Take 1 tablet (40 mg total) by mouth daily. 30 tablet 1   prochlorperazine (COMPAZINE) 10 MG tablet TAKE (1) TABLET BY MOUTH EVERY SIX HOURS AS NEEDED. 60 tablet 3   sulfamethoxazole-trimethoprim (BACTRIM DS) 800-160 MG tablet Take 1 tablet by mouth 2 (two) times daily. 10 tablet 0   vitamin B-12 (CYANOCOBALAMIN) 1000 MCG tablet Take 1,000 mcg by mouth daily.     No current facility-administered medications for this visit.   Facility-Administered Medications Ordered in Other Visits  Medication Dose Route Frequency Provider Last Rate Last Admin   0.9 %  sodium chloride infusion (Manually program via Guardrails IV Fluids)  250 mL Intravenous Once Derek Jack, MD       acetaminophen (TYLENOL) 325 MG tablet            diphenhydrAMINE (BENADRYL) 25 mg capsule            palonosetron (ALOXI) 0.25 MG/5ML injection            sodium chloride  flush (NS) 0.9 % injection 10 mL  10 mL Intracatheter PRN Derek Jack, MD   10 mL at 03/23/22 1025    ALLERGIES:  Allergies  Allergen Reactions   Aspirin Other (See Comments)    Gi upset, can tolerate baby aspirin   Flomax [Tamsulosin] Nausea Only    PHYSICAL EXAM:  Performance status (ECOG): 1 - Symptomatic but completely ambulatory  Vitals:   03/23/22 0805  BP: (!) 145/73  Pulse: (!) 110  Resp: 19  Temp: 98.9 F (37.2 C)  SpO2: 99%    Wt Readings from Last 3 Encounters:  03/23/22 153 lb 6.4 oz (69.6 kg)  03/22/22 152 lb 6.4 oz (69.1 kg)  03/15/22 153 lb (69.4 kg)   Physical Exam Vitals reviewed.  Constitutional:      Appearance: Normal appearance.  Cardiovascular:     Rate and Rhythm: Normal rate and regular rhythm.     Pulses: Normal pulses.     Heart sounds: Normal heart sounds.  Pulmonary:     Effort: Pulmonary effort is normal.     Breath sounds: Normal breath sounds.  Abdominal:     Palpations: Abdomen is soft. There is no mass.     Tenderness: There is no abdominal tenderness.  Musculoskeletal:     Right lower leg: 1+ Edema present.     Left lower leg: 1+ Edema present.  Neurological:     General: No focal deficit present.     Mental Status: He is alert and oriented to person, place, and time.  Psychiatric:        Mood and Affect: Mood normal.        Behavior: Behavior normal.     LABORATORY DATA:  I have reviewed the labs as listed.     Latest Ref Rng & Units 03/22/2022    9:28 AM 03/15/2022   10:13 AM 03/13/2022    3:25 PM  CBC  WBC 4.0 - 10.5 K/uL 16.5  8.3  9.7   Hemoglobin 13.0 - 17.0 g/dL 7.6  7.3  8.6   Hematocrit 39.0 - 52.0 % 22.8  22.6  26.4   Platelets 150 - 400 K/uL 55  49  43       Latest Ref Rng & Units 03/22/2022    9:28 AM 03/15/2022   10:13 AM 03/13/2022    3:25 PM  CMP  Glucose 70 - 99 mg/dL 126  122  152   BUN 8 - 23 mg/dL 24  21  21    Creatinine 0.61 - 1.24 mg/dL 1.04  1.08  1.12   Sodium 135 - 145 mmol/L 138  139   138   Potassium 3.5 - 5.1 mmol/L 3.6  3.7  3.7   Chloride 98 - 111 mmol/L 111  111  109   CO2 22 - 32 mmol/L 21  23  23    Calcium 8.9 - 10.3 mg/dL 8.7  8.5  8.7   Total Protein 6.5 - 8.1 g/dL 6.3  6.0  6.3   Total Bilirubin 0.3 - 1.2 mg/dL 0.4  0.4  0.5   Alkaline Phos 38 - 126 U/L 59  54  59   AST 15 - 41 U/L 14  12  12    ALT 0 - 44 U/L 12  10  10      DIAGNOSTIC IMAGING:  I have independently reviewed the scans and discussed with the patient. CT Biopsy  Result Date: 02/25/2022 INDICATION: MDS, assess for treatment response EXAM: CT GUIDED RIGHT ILIAC BONE MARROW ASPIRATION AND CORE BIOPSY Date:  02/25/2022 02/25/2022 10:02 am Radiologist:  M. Daryll Brod, MD Guidance:  CT FLUOROSCOPY: Fluoroscopy Time: None. MEDICATIONS: 1% lidocaine local ANESTHESIA/SEDATION: 1.0 mg IV Versed; 50 mcg IV Fentanyl Moderate Sedation Time:  10 minute The patient was continuously monitored during the procedure by the interventional radiology nurse under my direct supervision. CONTRAST:  None COMPLICATIONS: None PROCEDURE: Informed consent was obtained from the patient following explanation of the procedure, risks, benefits and alternatives. The patient understands, agrees and consents for the  procedure. All questions were addressed. A time out was performed. The patient was positioned prone and non-contrast localization CT was performed of the pelvis to demonstrate the iliac marrow spaces. Maximal barrier sterile technique utilized including caps, mask, sterile gowns, sterile gloves, large sterile drape, hand hygiene, and Betadine prep. Under sterile conditions and local anesthesia, an 11 gauge coaxial bone biopsy needle was advanced into the right iliac marrow space. Needle position was confirmed with CT imaging. Initially, bone marrow aspiration was performed. Next, the 11 gauge outer cannula was utilized to obtain a right iliac bone marrow core biopsy. Needle was removed. Hemostasis was obtained with compression. The  patient tolerated the procedure well. Samples were prepared with the cytotechnologist. No immediate complications. IMPRESSION: CT guided right iliac bone marrow aspiration and core biopsy. Electronically Signed   By: Jerilynn Mages.  Shick M.D.   On: 02/25/2022 10:38   CT BONE MARROW BIOPSY & ASPIRATION  Result Date: 02/25/2022 INDICATION: MDS, assess for treatment response EXAM: CT GUIDED RIGHT ILIAC BONE MARROW ASPIRATION AND CORE BIOPSY Date:  02/25/2022 02/25/2022 10:02 am Radiologist:  M. Daryll Brod, MD Guidance:  CT FLUOROSCOPY: Fluoroscopy Time: None. MEDICATIONS: 1% lidocaine local ANESTHESIA/SEDATION: 1.0 mg IV Versed; 50 mcg IV Fentanyl Moderate Sedation Time:  10 minute The patient was continuously monitored during the procedure by the interventional radiology nurse under my direct supervision. CONTRAST:  None COMPLICATIONS: None PROCEDURE: Informed consent was obtained from the patient following explanation of the procedure, risks, benefits and alternatives. The patient understands, agrees and consents for the procedure. All questions were addressed. A time out was performed. The patient was positioned prone and non-contrast localization CT was performed of the pelvis to demonstrate the iliac marrow spaces. Maximal barrier sterile technique utilized including caps, mask, sterile gowns, sterile gloves, large sterile drape, hand hygiene, and Betadine prep. Under sterile conditions and local anesthesia, an 11 gauge coaxial bone biopsy needle was advanced into the right iliac marrow space. Needle position was confirmed with CT imaging. Initially, bone marrow aspiration was performed. Next, the 11 gauge outer cannula was utilized to obtain a right iliac bone marrow core biopsy. Needle was removed. Hemostasis was obtained with compression. The patient tolerated the procedure well. Samples were prepared with the cytotechnologist. No immediate complications. IMPRESSION: CT guided right iliac bone marrow aspiration and  core biopsy. Electronically Signed   By: Jerilynn Mages.  Shick M.D.   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  Hemoglobin 15.5 on 09/20/2018.  Platelet count on 09/20/1998 2155. - He was on aspirin 81 mg at presentation.  He received 3 units PRBC.  Ferritin was 244 and percent saturation 37.  O75 was 643 and folic acid normal.  Creatinine was normal. - MMA, copper, folic acid and LDH was normal.  SPEP negative. - Serum EPO 140.  Kappa light chains elevated at 32.4 with ratio 1.55. - Bone marrow biopsy on 09/18/2021: Hypercellular marrow with trilineage dyspoiesis and monocytosis.  Dyspoiesis in the granulocytic precursors without apparent late maturation of neutrophils, increased monocytes but no significant blast population.  Increased megakaryocytes with widely separated nuclear lobes.  Erythroid precursors slightly decreased in number with the type ER.  Ring sideroblasts not identified. - Chromosome analysis and MDS FISH panel was normal. 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 - Based on [employer] model, he  has 3 points with circulating immature cells, decreased hemoglobin, decreased platelets.  This puts him in high risk, with overall survival 16 months. - Azacitidine started on 10/19/2021.  Required 2 units PRBC after cycle 2, 3 units PRBC after cycle 3. - BMBX (02/25/2022): Hypercellular marrow with 5% blasts on smear.  Blasts were not seen on flow cytometry.  MDS FISH panel was normal.  Cytogenetics was normal.    Social/family history: - Lives at home by himself.  Son lives in [place].  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  No exposure to chemicals.  Non-smoker. - Brother had colon cancer.  Another brother had?  Stomach cancer.  2 maternal uncles had cancer, type unknown to the  patient.   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  Hemoglobin is 7.6.  Platelets are 55.  Predominantly monocytes on differential.      - Recent bone marrow biopsy was discussed with the patient.  Aspirate smears show 5% blasts.  Previously no blasts were seen.  Chromosome analysis was normal.        - I have discussed option of increasing azacitidine to full dose.  Other option is to add Revlimid 10 mg 3 weeks on/1 week off.        - We will proceed with full dose azacitidine days 1 through 7 at 75 mg per metered square.  Will monitor blood counts weekly.  He will receive 1 unit PRBC.        - RTC 6 weeks for follow-up.   2.  Weight loss: - Continue 2 to 3 cans of Ensure per day.  He is continuing to gain weight.  3.  Leg swelling: - Continue Lasix 20 mg in the mornings as needed.   Orders placed this encounter:  No orders of the defined types were placed in this encounter.    Derek Jack, MD [facility] [phone]

## 2022-03-23 NOTE — Patient Instructions (Signed)
[facility] at [facility] Discharge Instructions   You were seen and examined today by Dr. Delton Coombes.  He discussed the results of your bone marrow biopsy. There are more blast cells present versus the last time we checked. We will increase the dose of your Vidaza. We will also extend the length of treatment to 7 days versus 5 days.    We will proceed with your treatment today.  We will check your blood work weekly.  Return as scheduled.    Thank you for choosing [facility] at [facility] to provide your oncology and hematology care.  To afford each patient quality time with our provider, please arrive at least 15 minutes before your scheduled appointment time.   If you have a lab appointment with the [facility] please come in thru the Main Entrance and check in at the main information desk.  You need to re-schedule your appointment should you arrive 10 or more minutes late.  We strive to give you quality time with our providers, and arriving late affects you and other patients whose appointments are after yours.  Also, if you no show three or more times for appointments you may be dismissed from the clinic at the providers discretion.     Again, thank you for choosing [facility].  Our hope is that these requests will decrease the amount of time that you wait before being seen by our physicians.       _____________________________________________________________  Should you have questions after your visit to [facility], please contact our office at [phone] and follow the prompts.  Our office hours are 8:00 a.m. and 4:30 p.m. Monday - Friday.  Please note that voicemails left after 4:00 p.m. may not be returned until the following business day.  We are closed weekends and major holidays.  You do have access to a nurse 24-7, just call the main number to the clinic [phone] and do not press any options, hold  on the line and a nurse will answer the phone.    For prescription refill requests, have your pharmacy contact our office and allow 72 hours.    Due to Covid, you will need to wear a mask upon entering the hospital. If you do not have a mask, a mask will be given to you at the Main Entrance upon arrival. For doctor visits, patients may have 1 support person age 21 or older with them. For treatment visits, patients can not have anyone with them due to social distancing guidelines and our immunocompromised population.

## 2022-03-24 ENCOUNTER — Inpatient Hospital Stay: Payer: Medicare HMO

## 2022-03-24 VITALS — BP 114/67 | HR 96 | Temp 97.3°F | Resp 18

## 2022-03-24 DIAGNOSIS — C9310 Chronic myelomonocytic leukemia not having achieved remission: Secondary | ICD-10-CM

## 2022-03-24 DIAGNOSIS — Z95828 Presence of other vascular implants and grafts: Secondary | ICD-10-CM

## 2022-03-24 DIAGNOSIS — Z5111 Encounter for antineoplastic chemotherapy: Secondary | ICD-10-CM | POA: Diagnosis not present

## 2022-03-24 MED ORDER — HEPARIN SOD (PORK) LOCK FLUSH 100 UNIT/ML IV SOLN
500.0000 [IU] | Freq: Once | INTRAVENOUS | Status: AC | PRN
  Administered 2022-03-24: 500 [IU]

## 2022-03-24 MED ORDER — SODIUM CHLORIDE 0.9 % IV SOLN
75.0000 mg/m2 | Freq: Once | INTRAVENOUS | Status: AC
  Administered 2022-03-24: 140 mg via INTRAVENOUS
  Filled 2022-03-24: qty 14

## 2022-03-24 MED ORDER — SODIUM CHLORIDE 0.9 % IV SOLN: Freq: Once | INTRAVENOUS | Status: AC

## 2022-03-24 MED ORDER — SODIUM CHLORIDE 0.9 % IV SOLN
10.0000 mg | Freq: Once | INTRAVENOUS | Status: AC
  Administered 2022-03-24: 10 mg via INTRAVENOUS
  Filled 2022-03-24: qty 10

## 2022-03-24 MED ORDER — SODIUM CHLORIDE 0.9% FLUSH
10.0000 mL | INTRAVENOUS | Status: DC | PRN
  Administered 2022-03-24: 10 mL

## 2022-03-24 MED ORDER — PALONOSETRON HCL INJECTION 0.25 MG/5ML
0.2500 mg | Freq: Once | INTRAVENOUS | Status: AC
  Administered 2022-03-24: 0.25 mg via INTRAVENOUS

## 2022-03-24 NOTE — Progress Notes (Signed)
Patient presents today for Day3 Vidaza, treatmnent parameters met. Patient tolerated chemotherapy with no complaints voiced. Side effects with management reviewed understanding verbalized. Port site clean and dry with no bruising or swelling noted at site. Good blood return noted before and after administration of chemotherapy.  Patient left in satisfactory condition with VSS and no s/s of distress noted.

## 2022-03-24 NOTE — Patient Instructions (Signed)
[facility]  Discharge Instructions: Thank you for choosing [facility] to provide your oncology and hematology care.  If you have a lab appointment with the Cancer Center, please come in thru the Main Entrance and check in at the main information desk.  Wear comfortable clothing and clothing appropriate for easy access to any Portacath or PICC line.   We strive to give you quality time with your provider. You may need to reschedule your appointment if you arrive late (15 or more minutes).  Arriving late affects you and other patients whose appointments are after yours.  Also, if you miss three or more appointments without notifying the office, you may be dismissed from the clinic at the provider's discretion.      For prescription refill requests, have your pharmacy contact our office and allow 72 hours for refills to be completed.    Today you received the following chemotherapy and/or immunotherapy agents Vidaza, return as scheduled.   To help prevent nausea and vomiting after your treatment, we encourage you to take your nausea medication as directed.  BELOW ARE SYMPTOMS THAT SHOULD BE REPORTED IMMEDIATELY: *FEVER GREATER THAN 100.4 F (38 C) OR HIGHER *CHILLS OR SWEATING *NAUSEA AND VOMITING THAT IS NOT CONTROLLED WITH YOUR NAUSEA MEDICATION *UNUSUAL SHORTNESS OF BREATH *UNUSUAL BRUISING OR BLEEDING *URINARY PROBLEMS (pain or burning when urinating, or frequent urination) *BOWEL PROBLEMS (unusual diarrhea, constipation, pain near the anus) TENDERNESS IN MOUTH AND THROAT WITH OR WITHOUT PRESENCE OF ULCERS (sore throat, sores in mouth, or a toothache) UNUSUAL RASH, SWELLING OR PAIN  UNUSUAL VAGINAL DISCHARGE OR ITCHING   Items with * indicate a potential emergency and should be followed up as soon as possible or go to the Emergency Department if any problems should occur.  Please show the CHEMOTHERAPY ALERT CARD or IMMUNOTHERAPY ALERT CARD at  check-in to the Emergency Department and triage nurse.  Should you have questions after your visit or need to cancel or reschedule your appointment, please contact [facility] [phone]  and follow the prompts.  Office hours are 8:00 a.m. to 4:30 p.m. Monday - Friday. Please note that voicemails left after 4:00 p.m. may not be returned until the following business day.  We are closed weekends and major holidays. You have access to a nurse at all times for urgent questions. Please call the main number to the clinic [phone] and follow the prompts.  For any non-urgent questions, you may also contact your provider using MyChart. We now offer e-Visits for anyone 18 and older to request care online for non-urgent symptoms. For details visit [email or web address].   Also download the MyChart app! Go to the app store, search "MyChart", open the app, select [facility], and log in with your MyChart username and password.  Masks are optional in the cancer centers. If you would like for your care team to wear a mask while they are taking care of you, please let them know. You may have one support person who is at least 86 years old accompany you for your appointments.  

## 2022-03-25 ENCOUNTER — Inpatient Hospital Stay: Payer: Medicare HMO

## 2022-03-25 VITALS — BP 122/60 | HR 82 | Temp 97.8°F | Resp 18

## 2022-03-25 DIAGNOSIS — Z95828 Presence of other vascular implants and grafts: Secondary | ICD-10-CM

## 2022-03-25 DIAGNOSIS — C9310 Chronic myelomonocytic leukemia not having achieved remission: Secondary | ICD-10-CM

## 2022-03-25 DIAGNOSIS — Z5111 Encounter for antineoplastic chemotherapy: Secondary | ICD-10-CM | POA: Diagnosis not present

## 2022-03-25 MED ORDER — HEPARIN SOD (PORK) LOCK FLUSH 100 UNIT/ML IV SOLN
500.0000 [IU] | Freq: Once | INTRAVENOUS | Status: AC | PRN
  Administered 2022-03-25: 500 [IU]

## 2022-03-25 MED ORDER — SODIUM CHLORIDE 0.9 % IV SOLN: Freq: Once | INTRAVENOUS | Status: AC

## 2022-03-25 MED ORDER — SODIUM CHLORIDE 0.9% FLUSH
10.0000 mL | INTRAVENOUS | Status: DC | PRN
  Administered 2022-03-25: 10 mL

## 2022-03-25 MED ORDER — SODIUM CHLORIDE 0.9 % IV SOLN
10.0000 mg | Freq: Once | INTRAVENOUS | Status: AC
  Administered 2022-03-25: 10 mg via INTRAVENOUS
  Filled 2022-03-25: qty 10

## 2022-03-25 MED ORDER — SODIUM CHLORIDE 0.9 % IV SOLN
75.0000 mg/m2 | Freq: Once | INTRAVENOUS | Status: AC
  Administered 2022-03-25: 140 mg via INTRAVENOUS
  Filled 2022-03-25: qty 14

## 2022-03-25 NOTE — Patient Instructions (Signed)
[facility]  Discharge Instructions: Thank you for choosing [facility] to provide your oncology and hematology care.  If you have a lab appointment with the Cancer Center, please come in thru the Main Entrance and check in at the main information desk.  Wear comfortable clothing and clothing appropriate for easy access to any Portacath or PICC line.   We strive to give you quality time with your provider. You may need to reschedule your appointment if you arrive late (15 or more minutes).  Arriving late affects you and other patients whose appointments are after yours.  Also, if you miss three or more appointments without notifying the office, you may be dismissed from the clinic at the provider's discretion.      For prescription refill requests, have your pharmacy contact our office and allow 72 hours for refills to be completed.    Today you received the following chemotherapy and/or immunotherapy agents Vidaza      To help prevent nausea and vomiting after your treatment, we encourage you to take your nausea medication as directed.  BELOW ARE SYMPTOMS THAT SHOULD BE REPORTED IMMEDIATELY: *FEVER GREATER THAN 100.4 F (38 C) OR HIGHER *CHILLS OR SWEATING *NAUSEA AND VOMITING THAT IS NOT CONTROLLED WITH YOUR NAUSEA MEDICATION *UNUSUAL SHORTNESS OF BREATH *UNUSUAL BRUISING OR BLEEDING *URINARY PROBLEMS (pain or burning when urinating, or frequent urination) *BOWEL PROBLEMS (unusual diarrhea, constipation, pain near the anus) TENDERNESS IN MOUTH AND THROAT WITH OR WITHOUT PRESENCE OF ULCERS (sore throat, sores in mouth, or a toothache) UNUSUAL RASH, SWELLING OR PAIN  UNUSUAL VAGINAL DISCHARGE OR ITCHING   Items with * indicate a potential emergency and should be followed up as soon as possible or go to the Emergency Department if any problems should occur.  Please show the CHEMOTHERAPY ALERT CARD or IMMUNOTHERAPY ALERT CARD at check-in to the Emergency  Department and triage nurse.  Should you have questions after your visit or need to cancel or reschedule your appointment, please contact [facility] [phone]  and follow the prompts.  Office hours are 8:00 a.m. to 4:30 p.m. Monday - Friday. Please note that voicemails left after 4:00 p.m. may not be returned until the following business day.  We are closed weekends and major holidays. You have access to a nurse at all times for urgent questions. Please call the main number to the clinic [phone] and follow the prompts.  For any non-urgent questions, you may also contact your provider using MyChart. We now offer e-Visits for anyone 18 and older to request care online for non-urgent symptoms. For details visit [email or web address].   Also download the MyChart app! Go to the app store, search "MyChart", open the app, select [facility], and log in with your MyChart username and password.  Masks are optional in the cancer centers. If you would like for your care team to wear a mask while they are taking care of you, please let them know. You may have one support person who is at least 86 years old accompany you for your appointments.  

## 2022-03-25 NOTE — Progress Notes (Signed)
Patient presents today for Vidaza infusion per providers order.  Vital signs within parameters for treatment.  Patient has no new complaints at this time.    Vidaza given today per MD orders.  Stable during infusion without adverse affects.  Vital signs stable.  No complaints at this time.  Discharge from clinic ambulatory in stable condition.  Alert and oriented X 3.  Follow up with [facility] as scheduled.    

## 2022-03-26 ENCOUNTER — Inpatient Hospital Stay: Payer: Medicare HMO

## 2022-03-26 VITALS — BP 118/52 | HR 53 | Temp 97.9°F | Resp 16

## 2022-03-26 DIAGNOSIS — Z5111 Encounter for antineoplastic chemotherapy: Secondary | ICD-10-CM | POA: Diagnosis not present

## 2022-03-26 DIAGNOSIS — Z95828 Presence of other vascular implants and grafts: Secondary | ICD-10-CM

## 2022-03-26 DIAGNOSIS — C9310 Chronic myelomonocytic leukemia not having achieved remission: Secondary | ICD-10-CM

## 2022-03-26 MED ORDER — HEPARIN SOD (PORK) LOCK FLUSH 100 UNIT/ML IV SOLN
500.0000 [IU] | Freq: Once | INTRAVENOUS | Status: AC | PRN
  Administered 2022-03-26: 500 [IU]

## 2022-03-26 MED ORDER — SODIUM CHLORIDE 0.9 % IV SOLN
10.0000 mg | Freq: Once | INTRAVENOUS | Status: AC
  Administered 2022-03-26: 10 mg via INTRAVENOUS
  Filled 2022-03-26: qty 10

## 2022-03-26 MED ORDER — SODIUM CHLORIDE 0.9 % IV SOLN: Freq: Once | INTRAVENOUS | Status: AC

## 2022-03-26 MED ORDER — SODIUM CHLORIDE 0.9 % IV SOLN
75.0000 mg/m2 | Freq: Once | INTRAVENOUS | Status: AC
  Administered 2022-03-26: 140 mg via INTRAVENOUS
  Filled 2022-03-26: qty 14

## 2022-03-26 MED ORDER — SODIUM CHLORIDE 0.9% FLUSH
10.0000 mL | INTRAVENOUS | Status: DC | PRN
  Administered 2022-03-26: 10 mL

## 2022-03-26 MED ORDER — PALONOSETRON HCL INJECTION 0.25 MG/5ML
0.2500 mg | Freq: Once | INTRAVENOUS | Status: AC
  Administered 2022-03-26: 0.25 mg via INTRAVENOUS
  Filled 2022-03-26: qty 5

## 2022-03-26 NOTE — Progress Notes (Signed)
Patient tolerated chemotherapy with no complaints voiced.  Side effects with management reviewed with understanding verbalized.  Port site clean and dry with no bruising or swelling noted at site.  Good blood return noted before and after administration of chemotherapy.  Band aid applied.  Patient left in satisfactory condition with VSS and no s/s of distress noted.   

## 2022-03-26 NOTE — Patient Instructions (Signed)
[facility]  Discharge Instructions: Thank you for choosing [facility] to provide your oncology and hematology care.  If you have a lab appointment with the [facility], please come in thru the Main Entrance and check in at the main information desk.  Wear comfortable clothing and clothing appropriate for easy access to any Portacath or PICC line.   We strive to give you quality time with your provider. You may need to reschedule your appointment if you arrive late (15 or more minutes).  Arriving late affects you and other patients whose appointments are after yours.  Also, if you miss three or more appointments without notifying the office, you may be dismissed from the clinic at the provider's discretion.      For prescription refill requests, have your pharmacy contact our office and allow 72 hours for refills to be completed.    Today you received the following chemotherapy and/or immunotherapy agents vidaza.  Azacitidine Injection What is this medication? AZACITIDINE (ay [place]rice) treats blood and bone marrow cancers. It works by slowing down the growth of cancer cells. This medicine may be used for other purposes; ask your health care provider or pharmacist if you have questions. COMMON BRAND NAME(S): Vidaza What should I tell my care team before I take this medication? They need to know if you have any of these conditions: Kidney disease Liver disease Low blood cell levels, such as low white cells, platelets, or red blood cells Low levels of albumin in the blood Low levels of bicarbonate in the blood An unusual or allergic reaction to azacitidine, mannitol, other medications, foods, dyes, or preservatives If you or your partner are pregnant or trying to get pregnant Breast-feeding How should I use this medication? This medication is injected into a vein or under the skin. It is given by your care team in a hospital or clinic  setting. Talk to your care team about the use of this medication in children. While it may be prescribed for children as young as 1 month for selected conditions, precautions do apply. Overdosage: If you think you have taken too much of this medicine contact a poison control center or emergency room at once. NOTE: This medicine is only for you. Do not share this medicine with others. What if I miss a dose? Keep appointments for follow-up doses. It is important not to miss your dose. Call your care team if you are unable to keep an appointment. What may interact with this medication? Interactions are not expected. This list may not describe all possible interactions. Give your health care provider a list of all the medicines, herbs, non-prescription drugs, or dietary supplements you use. Also tell them if you smoke, drink alcohol, or use illegal drugs. Some items may interact with your medicine. What should I watch for while using this medication? Your condition will be monitored carefully while you are receiving this medication. You may need blood work while taking this medication. This medication may make you feel generally unwell. This is not uncommon as chemotherapy can affect healthy cells as well as cancer cells. Report any side effects. Continue your course of treatment even though you feel ill unless your care team tells you to stop. Other product types may be available that contain the medication azacitidine. The injection and oral products should not be used in place of one another. Talk to your care team if you have questions. This medication can cause serious side  effects. To reduce the risk, your care team may give you other medications to take before receiving this one. Be sure to follow the directions from your care team. This medication may increase your risk of getting an infection. Call your care team for advice if you get a fever, chills, sore throat, or other symptoms of a cold or  flu. Do not treat yourself. Try to avoid being around people who are sick. Avoid taking medications that contain aspirin, acetaminophen, ibuprofen, naproxen, or ketoprofen unless instructed by your care team. These medications may hide a fever. This medication may increase your risk to bruise or bleed. Call your care team if you notice any unusual bleeding. Be careful brushing or flossing your teeth or using a toothpick because you may get an infection or bleed more easily. If you have any dental work done, tell your dentist you are receiving this medication. Talk to your care team if you or your partner may be pregnant. Serious birth defects can occur if you take this medication during pregnancy and for 6 months after the last dose. You will need a negative pregnancy test before starting this medication. Contraception is recommended while taking his medication and for 6 months after the last dose. Your care team can help you find the option that works for you. If your partner can get pregnant, use a condom during sex while taking this medication and for 3 months after the last dose. Do not breastfeed while taking this medication and for 1 week after the last dose. This medication may cause infertility. Talk to your care team if you are concerned about your fertility. What side effects may I notice from receiving this medication? Side effects that you should report to your care team as soon as possible: Allergic reactions--skin rash, itching, hives, swelling of the face, lips, tongue, or throat Infection--fever, chills, cough, sore throat, wounds that don't heal, pain or trouble when passing urine, general feeling of discomfort or being unwell Kidney injury--decrease in the amount of urine, swelling of the ankles, hands, or feet Liver injury--right upper belly pain, loss of appetite, nausea, light-colored stool, dark yellow or brown urine, yellowing skin or eyes, unusual weakness or fatigue Low red  blood cell level--unusual weakness or fatigue, dizziness, headache, trouble breathing Tumor lysis syndrome (TLS)--nausea, vomiting, diarrhea, decrease in the amount of urine, dark urine, unusual weakness or fatigue, confusion, muscle pain or cramps, fast or irregular heartbeat, joint pain Unusual bruising or bleeding Side effects that usually do not require medical attention (report to your care team if they continue or are bothersome): Constipation Diarrhea Nausea Pain, redness, or irritation at injection site Vomiting This list may not describe all possible side effects. Call your doctor for medical advice about side effects. You may report side effects to FDA at 1-800-FDA-1088. Where should I keep my medication? This medication is given in a hospital or clinic. It will not be stored at home. NOTE: This sheet is a summary. It may not cover all possible information. If you have questions about this medicine, talk to your doctor, pharmacist, or health care provider.  2023 Elsevier/Gold Standard (2021-12-10 00:00:00)       To help prevent nausea and vomiting after your treatment, we encourage you to take your nausea medication as directed.  BELOW ARE SYMPTOMS THAT SHOULD BE REPORTED IMMEDIATELY: *FEVER GREATER THAN 100.4 F (38 C) OR HIGHER *CHILLS OR SWEATING *NAUSEA AND VOMITING THAT IS NOT CONTROLLED WITH YOUR NAUSEA MEDICATION *UNUSUAL SHORTNESS OF BREATH *  UNUSUAL BRUISING OR BLEEDING *URINARY PROBLEMS (pain or burning when urinating, or frequent urination) *BOWEL PROBLEMS (unusual diarrhea, constipation, pain near the anus) TENDERNESS IN MOUTH AND THROAT WITH OR WITHOUT PRESENCE OF ULCERS (sore throat, sores in mouth, or a toothache) UNUSUAL RASH, SWELLING OR PAIN  UNUSUAL VAGINAL DISCHARGE OR ITCHING   Items with * indicate a potential emergency and should be followed up as soon as possible or go to the Emergency Department if any problems should occur.  Please show the  CHEMOTHERAPY ALERT CARD or IMMUNOTHERAPY ALERT CARD at check-in to the Emergency Department and triage nurse.  Should you have questions after your visit or need to cancel or reschedule your appointment, please contact [facility] [phone]  and follow the prompts.  Office hours are 8:00 a.m. to 4:30 p.m. Monday - Friday. Please note that voicemails left after 4:00 p.m. may not be returned until the following business day.  We are closed weekends and major holidays. You have access to a nurse at all times for urgent questions. Please call the main number to the clinic [phone] and follow the prompts.  For any non-urgent questions, you may also contact your provider using MyChart. We now offer e-Visits for anyone 60 and older to request care online for non-urgent symptoms. For details visit [email or web address].   Also download the MyChart app! Go to the app store, search "MyChart", open the app, select [facility], and log in with your MyChart username and password.  Masks are optional in the cancer centers. If you would like for your care team to wear a mask while they are taking care of you, please let them know. You may have one support person who is at least 86 years old accompany you for your appointments.

## 2022-03-29 ENCOUNTER — Inpatient Hospital Stay: Payer: Medicare HMO | Admitting: Dietician

## 2022-03-29 ENCOUNTER — Inpatient Hospital Stay: Payer: Medicare HMO

## 2022-03-29 VITALS — BP 123/61 | HR 78 | Temp 97.7°F | Resp 18

## 2022-03-29 DIAGNOSIS — C9310 Chronic myelomonocytic leukemia not having achieved remission: Secondary | ICD-10-CM

## 2022-03-29 DIAGNOSIS — Z5111 Encounter for antineoplastic chemotherapy: Secondary | ICD-10-CM | POA: Diagnosis not present

## 2022-03-29 DIAGNOSIS — D469 Myelodysplastic syndrome, unspecified: Secondary | ICD-10-CM

## 2022-03-29 DIAGNOSIS — Z95828 Presence of other vascular implants and grafts: Secondary | ICD-10-CM

## 2022-03-29 LAB — MAGNESIUM: Magnesium: 2.1 mg/dL (ref 1.7–2.4)

## 2022-03-29 LAB — CBC WITH DIFFERENTIAL/PLATELET
Abs Immature Granulocytes: 1.1 10*3/uL — ABNORMAL HIGH (ref 0.00–0.07)
Basophils Absolute: 0 10*3/uL (ref 0.0–0.1)
Basophils Relative: 0 %
Eosinophils Absolute: 0 10*3/uL (ref 0.0–0.5)
Eosinophils Relative: 0 %
HCT: 23.2 % — ABNORMAL LOW (ref 39.0–52.0)
Hemoglobin: 7.6 g/dL — ABNORMAL LOW (ref 13.0–17.0)
Lymphocytes Relative: 33 %
Lymphs Abs: 4.5 10*3/uL — ABNORMAL HIGH (ref 0.7–4.0)
MCH: 28.8 pg (ref 26.0–34.0)
MCHC: 32.8 g/dL (ref 30.0–36.0)
MCV: 87.9 fL (ref 80.0–100.0)
Metamyelocytes Relative: 4 %
Monocytes Absolute: 3.2 10*3/uL — ABNORMAL HIGH (ref 0.1–1.0)
Monocytes Relative: 24 %
Myelocytes: 3 %
Neutro Abs: 4.7 10*3/uL (ref 1.7–7.7)
Neutrophils Relative %: 35 %
Platelets: 24 10*3/uL — CL (ref 150–400)
Promyelocytes Relative: 1 %
RBC: 2.64 MIL/uL — ABNORMAL LOW (ref 4.22–5.81)
RDW: 15.8 % — ABNORMAL HIGH (ref 11.5–15.5)
WBC: 13.5 10*3/uL — ABNORMAL HIGH (ref 4.0–10.5)
nRBC: 0.4 % — ABNORMAL HIGH (ref 0.0–0.2)
nRBC: 2 /100 WBC — ABNORMAL HIGH

## 2022-03-29 LAB — PREPARE RBC (CROSSMATCH)

## 2022-03-29 LAB — SAMPLE TO BLOOD BANK

## 2022-03-29 LAB — COMPREHENSIVE METABOLIC PANEL
ALT: 13 U/L (ref 0–44)
AST: 13 U/L — ABNORMAL LOW (ref 15–41)
Albumin: 3.1 g/dL — ABNORMAL LOW (ref 3.5–5.0)
Alkaline Phosphatase: 54 U/L (ref 38–126)
Anion gap: 3 — ABNORMAL LOW (ref 5–15)
BUN: 33 mg/dL — ABNORMAL HIGH (ref 8–23)
CO2: 24 mmol/L (ref 22–32)
Calcium: 8.5 mg/dL — ABNORMAL LOW (ref 8.9–10.3)
Chloride: 110 mmol/L (ref 98–111)
Creatinine, Ser: 1.17 mg/dL (ref 0.61–1.24)
GFR, Estimated: 60 mL/min — ABNORMAL LOW (ref >60)
Glucose, Bld: 138 mg/dL — ABNORMAL HIGH (ref 70–99)
Potassium: 4.1 mmol/L (ref 3.5–5.1)
Sodium: 137 mmol/L (ref 135–145)
Total Bilirubin: 0.3 mg/dL (ref 0.3–1.2)
Total Protein: 6 g/dL — ABNORMAL LOW (ref 6.5–8.1)

## 2022-03-29 MED ORDER — SODIUM CHLORIDE 0.9 % IV SOLN: Freq: Once | INTRAVENOUS | Status: AC

## 2022-03-29 MED ORDER — PALONOSETRON HCL INJECTION 0.25 MG/5ML
0.2500 mg | Freq: Once | INTRAVENOUS | Status: AC
  Administered 2022-03-29: 0.25 mg via INTRAVENOUS

## 2022-03-29 MED ORDER — SODIUM CHLORIDE 0.9 % IV SOLN
75.0000 mg/m2 | Freq: Once | INTRAVENOUS | Status: AC
  Administered 2022-03-29: 140 mg via INTRAVENOUS
  Filled 2022-03-29: qty 14

## 2022-03-29 MED ORDER — SODIUM CHLORIDE 0.9 % IV SOLN
10.0000 mg | Freq: Once | INTRAVENOUS | Status: AC
  Administered 2022-03-29: 10 mg via INTRAVENOUS
  Filled 2022-03-29: qty 10

## 2022-03-29 MED ORDER — HEPARIN SOD (PORK) LOCK FLUSH 100 UNIT/ML IV SOLN
500.0000 [IU] | Freq: Once | INTRAVENOUS | Status: AC | PRN
  Administered 2022-03-29: 500 [IU]

## 2022-03-29 MED ORDER — SODIUM CHLORIDE 0.9% FLUSH
10.0000 mL | INTRAVENOUS | Status: DC | PRN
  Administered 2022-03-29: 10 mL

## 2022-03-29 NOTE — Progress Notes (Signed)
Patients port flushed without difficulty.  Good blood return noted with no bruising or swelling noted at site.  Patient remains accessed for chemotherapy treatment.  

## 2022-03-29 NOTE — Patient Instructions (Signed)
[facility]  Discharge Instructions: Thank you for choosing [facility] to provide your oncology and hematology care.  If you have a lab appointment with the Cancer Center, please come in thru the Main Entrance and check in at the main information desk.  Wear comfortable clothing and clothing appropriate for easy access to any Portacath or PICC line.   We strive to give you quality time with your provider. You may need to reschedule your appointment if you arrive late (15 or more minutes).  Arriving late affects you and other patients whose appointments are after yours.  Also, if you miss three or more appointments without notifying the office, you may be dismissed from the clinic at the provider's discretion.      For prescription refill requests, have your pharmacy contact our office and allow 72 hours for refills to be completed.    Today you received the following chemotherapy and/or immunotherapy agents Vidaza.   To help prevent nausea and vomiting after your treatment, we encourage you to take your nausea medication as directed.  BELOW ARE SYMPTOMS THAT SHOULD BE REPORTED IMMEDIATELY: *FEVER GREATER THAN 100.4 F (38 C) OR HIGHER *CHILLS OR SWEATING *NAUSEA AND VOMITING THAT IS NOT CONTROLLED WITH YOUR NAUSEA MEDICATION *UNUSUAL SHORTNESS OF BREATH *UNUSUAL BRUISING OR BLEEDING *URINARY PROBLEMS (pain or burning when urinating, or frequent urination) *BOWEL PROBLEMS (unusual diarrhea, constipation, pain near the anus) TENDERNESS IN MOUTH AND THROAT WITH OR WITHOUT PRESENCE OF ULCERS (sore throat, sores in mouth, or a toothache) UNUSUAL RASH, SWELLING OR PAIN  UNUSUAL VAGINAL DISCHARGE OR ITCHING   Items with * indicate a potential emergency and should be followed up as soon as possible or go to the Emergency Department if any problems should occur.  Please show the CHEMOTHERAPY ALERT CARD or IMMUNOTHERAPY ALERT CARD at check-in to the Emergency  Department and triage nurse.  Should you have questions after your visit or need to cancel or reschedule your appointment, please contact [facility] [phone]  and follow the prompts.  Office hours are 8:00 a.m. to 4:30 p.m. Monday - Friday. Please note that voicemails left after 4:00 p.m. may not be returned until the following business day.  We are closed weekends and major holidays. You have access to a nurse at all times for urgent questions. Please call the main number to the clinic [phone] and follow the prompts.  For any non-urgent questions, you may also contact your provider using MyChart. We now offer e-Visits for anyone 18 and older to request care online for non-urgent symptoms. For details visit [email or web address].   Also download the MyChart app! Go to the app store, search "MyChart", open the app, select [facility], and log in with your MyChart username and password.  Masks are optional in the cancer centers. If you would like for your care team to wear a mask while they are taking care of you, please let them know. You may have one support person who is at least 86 years old accompany you for your appointments.  Azacitidine Injection What is this medication? AZACITIDINE (ay za SITE i deen) treats blood and bone marrow cancers. It works by slowing down the growth of cancer cells. This medicine may be used for other purposes; ask your health care provider or pharmacist if you have questions. COMMON BRAND NAME(S): Vidaza What should I tell my care team before I take this medication? They need to know if you have any   of these conditions: Kidney disease Liver disease Low blood cell levels, such as low white cells, platelets, or red blood cells Low levels of albumin in the blood Low levels of bicarbonate in the blood An unusual or allergic reaction to azacitidine, mannitol, other medications, foods, dyes, or preservatives If you or your partner are  pregnant or trying to get pregnant Breast-feeding How should I use this medication? This medication is injected into a vein or under the skin. It is given by your care team in a hospital or clinic setting. Talk to your care team about the use of this medication in children. While it may be prescribed for children as young as 1 month for selected conditions, precautions do apply. Overdosage: If you think you have taken too much of this medicine contact a poison control center or emergency room at once. NOTE: This medicine is only for you. Do not share this medicine with others. What if I miss a dose? Keep appointments for follow-up doses. It is important not to miss your dose. Call your care team if you are unable to keep an appointment. What may interact with this medication? Interactions are not expected. This list may not describe all possible interactions. Give your health care provider a list of all the medicines, herbs, non-prescription drugs, or dietary supplements you use. Also tell them if you smoke, drink alcohol, or use illegal drugs. Some items may interact with your medicine. What should I watch for while using this medication? Your condition will be monitored carefully while you are receiving this medication. You may need blood work while taking this medication. This medication may make you feel generally unwell. This is not uncommon as chemotherapy can affect healthy cells as well as cancer cells. Report any side effects. Continue your course of treatment even though you feel ill unless your care team tells you to stop. Other product types may be available that contain the medication azacitidine. The injection and oral products should not be used in place of one another. Talk to your care team if you have questions. This medication can cause serious side effects. To reduce the risk, your care team may give you other medications to take before receiving this one. Be sure to follow the  directions from your care team. This medication may increase your risk of getting an infection. Call your care team for advice if you get a fever, chills, sore throat, or other symptoms of a cold or flu. Do not treat yourself. Try to avoid being around people who are sick. Avoid taking medications that contain aspirin, acetaminophen, ibuprofen, naproxen, or ketoprofen unless instructed by your care team. These medications may hide a fever. This medication may increase your risk to bruise or bleed. Call your care team if you notice any unusual bleeding. Be careful brushing or flossing your teeth or using a toothpick because you may get an infection or bleed more easily. If you have any dental work done, tell your dentist you are receiving this medication. Talk to your care team if you or your partner may be pregnant. Serious birth defects can occur if you take this medication during pregnancy and for 6 months after the last dose. You will need a negative pregnancy test before starting this medication. Contraception is recommended while taking his medication and for 6 months after the last dose. Your care team can help you find the option that works for you. If your partner can get pregnant, use a condom during sex while   taking this medication and for 3 months after the last dose. Do not breastfeed while taking this medication and for 1 week after the last dose. This medication may cause infertility. Talk to your care team if you are concerned about your fertility. What side effects may I notice from receiving this medication? Side effects that you should report to your care team as soon as possible: Allergic reactions--skin rash, itching, hives, swelling of the face, lips, tongue, or throat Infection--fever, chills, cough, sore throat, wounds that don't heal, pain or trouble when passing urine, general feeling of discomfort or being unwell Kidney injury--decrease in the amount of urine, swelling of the  ankles, hands, or feet Liver injury--right upper belly pain, loss of appetite, nausea, light-colored stool, dark yellow or brown urine, yellowing skin or eyes, unusual weakness or fatigue Low red blood cell level--unusual weakness or fatigue, dizziness, headache, trouble breathing Tumor lysis syndrome (TLS)--nausea, vomiting, diarrhea, decrease in the amount of urine, dark urine, unusual weakness or fatigue, confusion, muscle pain or cramps, fast or irregular heartbeat, joint pain Unusual bruising or bleeding Side effects that usually do not require medical attention (report to your care team if they continue or are bothersome): Constipation Diarrhea Nausea Pain, redness, or irritation at injection site Vomiting This list may not describe all possible side effects. Call your doctor for medical advice about side effects. You may report side effects to FDA at 1-800-FDA-1088. Where should I keep my medication? This medication is given in a hospital or clinic. It will not be stored at home. NOTE: This sheet is a summary. It may not cover all possible information. If you have questions about this medicine, talk to your doctor, pharmacist, or health care provider.  2023 Elsevier/Gold Standard (2021-12-10 00:00:00)   

## 2022-03-29 NOTE — Progress Notes (Signed)
Pt presents today for treatment today. Pt's hemglobin was 7.6 and platelets 24 today. Verbal order given by A.Anderson to proceed with treatment today. Pt will receive 1 unit of blood tomorrow per Dr.K.  Lacie Scotts given today per MD orders. Tolerated infusion without adverse affects. Vital signs stable. No complaints at this time. Discharged from clinic ambulatory in stable condition. Alert and oriented x 3. F/U with [facility] as scheduled.

## 2022-03-29 NOTE — Progress Notes (Signed)
Nutrition Follow-up:  Patient with chronic myelomonocytic leukemia. He is receiving azacitidine q28d.   Met with patient in infusion. He reports appetite has improved and is eating well. Patient states his stomach has "started accepting food again." Patient reports altered taste has improved. Yesterday he recalls bowl of oatmeal, spinach, "some type of meat", watermelon, cantaloupe. He is drinking 2 Boost VHC (530 kcal, 22 g protein). Patient endorses occasional constipation. He takes stool softener as needed. Patient reports having regular BM last night.  -mild pitting RLE edema per RD assessment today. Reports 20 mg lasix daily  Medications: megace, lasix  Labs: glucose 138, BUN 33  Anthropometrics: Last weight 153 lb 6.4 oz on 8/15   8/7 - 153 lb 7/24 - 141 lb 14.4 oz  6/19 - 146 lb 8 oz   NUTRITION DIAGNOSIS: Moderate malnutrition continues, but stable   INTERVENTION:  Continue 2 Boost VHC/day to promote weight gain Continue appetite stimulant per MD    MONITORING, EVALUATION, GOAL: weight trends, intake    NEXT VISIT: Thursday September 7 during infusion

## 2022-03-30 ENCOUNTER — Inpatient Hospital Stay: Payer: Medicare HMO

## 2022-03-30 ENCOUNTER — Ambulatory Visit: Payer: Medicare HMO | Admitting: Urology

## 2022-03-30 VITALS — BP 126/56 | HR 56 | Temp 98.2°F | Resp 16

## 2022-03-30 DIAGNOSIS — Z95828 Presence of other vascular implants and grafts: Secondary | ICD-10-CM

## 2022-03-30 DIAGNOSIS — Z5111 Encounter for antineoplastic chemotherapy: Secondary | ICD-10-CM | POA: Diagnosis not present

## 2022-03-30 DIAGNOSIS — C9310 Chronic myelomonocytic leukemia not having achieved remission: Secondary | ICD-10-CM

## 2022-03-30 MED ORDER — SODIUM CHLORIDE 0.9% IV SOLUTION
250.0000 mL | Freq: Once | INTRAVENOUS | Status: AC
  Administered 2022-03-30: 250 mL via INTRAVENOUS

## 2022-03-30 MED ORDER — HEPARIN SOD (PORK) LOCK FLUSH 100 UNIT/ML IV SOLN
500.0000 [IU] | Freq: Every day | INTRAVENOUS | Status: AC | PRN
  Administered 2022-03-30: 500 [IU]

## 2022-03-30 MED ORDER — SODIUM CHLORIDE 0.9 % IV SOLN: Freq: Once | INTRAVENOUS | Status: AC

## 2022-03-30 MED ORDER — DIPHENHYDRAMINE HCL 25 MG PO CAPS
25.0000 mg | ORAL_CAPSULE | Freq: Once | ORAL | Status: AC
  Administered 2022-03-30: 25 mg via ORAL
  Filled 2022-03-30: qty 1

## 2022-03-30 MED ORDER — ACETAMINOPHEN 325 MG PO TABS
650.0000 mg | ORAL_TABLET | Freq: Once | ORAL | Status: AC
  Administered 2022-03-30: 650 mg via ORAL
  Filled 2022-03-30: qty 2

## 2022-03-30 MED ORDER — SODIUM CHLORIDE 0.9 % IV SOLN
10.0000 mg | Freq: Once | INTRAVENOUS | Status: AC
  Administered 2022-03-30: 10 mg via INTRAVENOUS
  Filled 2022-03-30: qty 10

## 2022-03-30 MED ORDER — SODIUM CHLORIDE 0.9% FLUSH
10.0000 mL | INTRAVENOUS | Status: AC | PRN
  Administered 2022-03-30: 10 mL

## 2022-03-30 MED ORDER — SODIUM CHLORIDE 0.9 % IV SOLN
75.0000 mg/m2 | Freq: Once | INTRAVENOUS | Status: AC
  Administered 2022-03-30: 140 mg via INTRAVENOUS
  Filled 2022-03-30: qty 14

## 2022-03-30 NOTE — Patient Instructions (Signed)
[facility]  Discharge Instructions: Thank you for choosing [facility] to provide your oncology and hematology care.  If you have a lab appointment with the [facility], please come in thru the Main Entrance and check in at the main information desk.  Wear comfortable clothing and clothing appropriate for easy access to any Portacath or PICC line.   We strive to give you quality time with your provider. You may need to reschedule your appointment if you arrive late (15 or more minutes).  Arriving late affects you and other patients whose appointments are after yours.  Also, if you miss three or more appointments without notifying the office, you may be dismissed from the clinic at the provider's discretion.      For prescription refill requests, have your pharmacy contact our office and allow 72 hours for refills to be completed.    Today you received the following chemotherapy and/or immunotherapy agents vidaza, one unit of blood       To help prevent nausea and vomiting after your treatment, we encourage you to take your nausea medication as directed.  BELOW ARE SYMPTOMS THAT SHOULD BE REPORTED IMMEDIATELY: *FEVER GREATER THAN 100.4 F (38 C) OR HIGHER *CHILLS OR SWEATING *NAUSEA AND VOMITING THAT IS NOT CONTROLLED WITH YOUR NAUSEA MEDICATION *UNUSUAL SHORTNESS OF BREATH *UNUSUAL BRUISING OR BLEEDING *URINARY PROBLEMS (pain or burning when urinating, or frequent urination) *BOWEL PROBLEMS (unusual diarrhea, constipation, pain near the anus) TENDERNESS IN MOUTH AND THROAT WITH OR WITHOUT PRESENCE OF ULCERS (sore throat, sores in mouth, or a toothache) UNUSUAL RASH, SWELLING OR PAIN  UNUSUAL VAGINAL DISCHARGE OR ITCHING   Items with * indicate a potential emergency and should be followed up as soon as possible or go to the Emergency Department if any problems should occur.  Please show the CHEMOTHERAPY ALERT CARD or IMMUNOTHERAPY ALERT CARD at  check-in to the Emergency Department and triage nurse.  Should you have questions after your visit or need to cancel or reschedule your appointment, please contact [facility] [phone]  and follow the prompts.  Office hours are 8:00 a.m. to 4:30 p.m. Monday - Friday. Please note that voicemails left after 4:00 p.m. may not be returned until the following business day.  We are closed weekends and major holidays. You have access to a nurse at all times for urgent questions. Please call the main number to the clinic [phone] and follow the prompts.  For any non-urgent questions, you may also contact your provider using MyChart. We now offer e-Visits for anyone 47 and older to request care online for non-urgent symptoms. For details visit [email or web address].   Also download the MyChart app! Go to the app store, search "MyChart", open the app, select [facility], and log in with your MyChart username and password.  Masks are optional in the cancer centers. If you would like for your care team to wear a mask while they are taking care of you, please let them know. You may have one support person who is at least 86 years old accompany you for your appointments.

## 2022-03-30 NOTE — Progress Notes (Unsigned)
One unit of blood and Treatment given per orders. Patient tolerated it well without problems. Vitals stable and discharged home from clinic ambulatory. Follow up as scheduled.

## 2022-03-31 LAB — TYPE AND SCREEN
ABO/RH(D): B POS
Antibody Screen: NEGATIVE
Unit division: 0

## 2022-03-31 LAB — BPAM RBC
Blood Product Expiration Date: 202309222359
ISSUE DATE / TIME: 202308221010
Unit Type and Rh: 1700

## 2022-04-06 ENCOUNTER — Inpatient Hospital Stay: Payer: Medicare HMO

## 2022-04-06 ENCOUNTER — Ambulatory Visit (INDEPENDENT_AMBULATORY_CARE_PROVIDER_SITE_OTHER): Payer: Medicare HMO | Admitting: Urology

## 2022-04-06 VITALS — BP 147/74 | HR 111

## 2022-04-06 DIAGNOSIS — R339 Retention of urine, unspecified: Secondary | ICD-10-CM

## 2022-04-06 DIAGNOSIS — Z5111 Encounter for antineoplastic chemotherapy: Secondary | ICD-10-CM | POA: Diagnosis not present

## 2022-04-06 DIAGNOSIS — N138 Other obstructive and reflux uropathy: Secondary | ICD-10-CM

## 2022-04-06 DIAGNOSIS — R972 Elevated prostate specific antigen [PSA]: Secondary | ICD-10-CM

## 2022-04-06 DIAGNOSIS — D469 Myelodysplastic syndrome, unspecified: Secondary | ICD-10-CM

## 2022-04-06 DIAGNOSIS — R8281 Pyuria: Secondary | ICD-10-CM

## 2022-04-06 DIAGNOSIS — C9310 Chronic myelomonocytic leukemia not having achieved remission: Secondary | ICD-10-CM

## 2022-04-06 DIAGNOSIS — N5089 Other specified disorders of the male genital organs: Secondary | ICD-10-CM | POA: Diagnosis not present

## 2022-04-06 LAB — CBC WITH DIFFERENTIAL/PLATELET
Abs Immature Granulocytes: 0 10*3/uL (ref 0.00–0.07)
Basophils Absolute: 0 10*3/uL (ref 0.0–0.1)
Basophils Relative: 0 %
Eosinophils Absolute: 0 10*3/uL (ref 0.0–0.5)
Eosinophils Relative: 0 %
HCT: 24.3 % — ABNORMAL LOW (ref 39.0–52.0)
Hemoglobin: 7.9 g/dL — ABNORMAL LOW (ref 13.0–17.0)
Lymphocytes Relative: 26 %
Lymphs Abs: 2.3 10*3/uL (ref 0.7–4.0)
MCH: 29.4 pg (ref 26.0–34.0)
MCHC: 32.5 g/dL (ref 30.0–36.0)
MCV: 90.3 fL (ref 80.0–100.0)
Monocytes Absolute: 2.6 10*3/uL — ABNORMAL HIGH (ref 0.1–1.0)
Monocytes Relative: 30 %
Neutro Abs: 3.9 10*3/uL (ref 1.7–7.7)
Neutrophils Relative %: 44 %
Platelets: 22 10*3/uL — CL (ref 150–400)
RBC: 2.69 MIL/uL — ABNORMAL LOW (ref 4.22–5.81)
RDW: 15.4 % (ref 11.5–15.5)
WBC: 8.8 10*3/uL (ref 4.0–10.5)
nRBC: 0 % (ref 0.0–0.2)

## 2022-04-06 LAB — URINALYSIS, ROUTINE W REFLEX MICROSCOPIC
Bilirubin, UA: NEGATIVE
Glucose, UA: NEGATIVE
Nitrite, UA: POSITIVE — AB
Specific Gravity, UA: 1.015 (ref 1.005–1.030)
Urobilinogen, Ur: >8 mg/dL — ABNORMAL HIGH (ref 0.2–1.0)
pH, UA: 7 (ref 5.0–7.5)

## 2022-04-06 LAB — COMPREHENSIVE METABOLIC PANEL
ALT: 13 U/L (ref 0–44)
AST: 14 U/L — ABNORMAL LOW (ref 15–41)
Albumin: 3 g/dL — ABNORMAL LOW (ref 3.5–5.0)
Alkaline Phosphatase: 46 U/L (ref 38–126)
Anion gap: 7 (ref 5–15)
BUN: 25 mg/dL — ABNORMAL HIGH (ref 8–23)
CO2: 21 mmol/L — ABNORMAL LOW (ref 22–32)
Calcium: 8.4 mg/dL — ABNORMAL LOW (ref 8.9–10.3)
Chloride: 111 mmol/L (ref 98–111)
Creatinine, Ser: 1.17 mg/dL (ref 0.61–1.24)
GFR, Estimated: 60 mL/min — ABNORMAL LOW (ref >60)
Glucose, Bld: 115 mg/dL — ABNORMAL HIGH (ref 70–99)
Potassium: 3.9 mmol/L (ref 3.5–5.1)
Sodium: 139 mmol/L (ref 135–145)
Total Bilirubin: 0.6 mg/dL (ref 0.3–1.2)
Total Protein: 5.6 g/dL — ABNORMAL LOW (ref 6.5–8.1)

## 2022-04-06 LAB — MICROSCOPIC EXAMINATION
Renal Epithel, UA: NONE SEEN /hpf
WBC, UA: >30 /hpf — AB (ref 0–5)

## 2022-04-06 LAB — SURGICAL PATHOLOGY

## 2022-04-06 LAB — MAGNESIUM: Magnesium: 2.1 mg/dL (ref 1.7–2.4)

## 2022-04-06 LAB — SAMPLE TO BLOOD BANK

## 2022-04-06 MED ORDER — SODIUM CHLORIDE 0.9% FLUSH
10.0000 mL | Freq: Once | INTRAVENOUS | Status: AC
  Administered 2022-04-06: 10 mL via INTRAVENOUS

## 2022-04-06 MED ORDER — HEPARIN SOD (PORK) LOCK FLUSH 100 UNIT/ML IV SOLN
500.0000 [IU] | Freq: Once | INTRAVENOUS | Status: AC
  Administered 2022-04-06: 500 [IU] via INTRAVENOUS

## 2022-04-06 NOTE — Progress Notes (Signed)
CRITICAL VALUE ALERT Critical value received:  platelets 22 Date of notification:  04/06/22 Time of notification: 0881 Critical value read back:  Yes.   Nurse who received alert:  C. Makaylia Hewett RN MD notified time and response:  0945, Dr. Delton Coombes  , no new orders at this time.  Vitals stable and discharged home from clinic ambulatory. Follow up as scheduled.

## 2022-04-06 NOTE — Patient Instructions (Signed)
[facility]  Discharge Instructions: Thank you for choosing [facility] to provide your oncology and hematology care.  If you have a lab appointment with the Cancer Center, please come in thru the Main Entrance and check in at the main information desk.  Wear comfortable clothing and clothing appropriate for easy access to any Portacath or PICC line.   We strive to give you quality time with your provider. You may need to reschedule your appointment if you arrive late (15 or more minutes).  Arriving late affects you and other patients whose appointments are after yours.  Also, if you miss three or more appointments without notifying the office, you may be dismissed from the clinic at the provider's discretion.      For prescription refill requests, have your pharmacy contact our office and allow 72 hours for refills to be completed.    Today you received the following chemotherapy and/or immunotherapy agents Port flush      To help prevent nausea and vomiting after your treatment, we encourage you to take your nausea medication as directed.  BELOW ARE SYMPTOMS THAT SHOULD BE REPORTED IMMEDIATELY: *FEVER GREATER THAN 100.4 F (38 C) OR HIGHER *CHILLS OR SWEATING *NAUSEA AND VOMITING THAT IS NOT CONTROLLED WITH YOUR NAUSEA MEDICATION *UNUSUAL SHORTNESS OF BREATH *UNUSUAL BRUISING OR BLEEDING *URINARY PROBLEMS (pain or burning when urinating, or frequent urination) *BOWEL PROBLEMS (unusual diarrhea, constipation, pain near the anus) TENDERNESS IN MOUTH AND THROAT WITH OR WITHOUT PRESENCE OF ULCERS (sore throat, sores in mouth, or a toothache) UNUSUAL RASH, SWELLING OR PAIN  UNUSUAL VAGINAL DISCHARGE OR ITCHING   Items with * indicate a potential emergency and should be followed up as soon as possible or go to the Emergency Department if any problems should occur.  Please show the CHEMOTHERAPY ALERT CARD or IMMUNOTHERAPY ALERT CARD at check-in to the  Emergency Department and triage nurse.  Should you have questions after your visit or need to cancel or reschedule your appointment, please contact [facility] [phone]  and follow the prompts.  Office hours are 8:00 a.m. to 4:30 p.m. Monday - Friday. Please note that voicemails left after 4:00 p.m. may not be returned until the following business day.  We are closed weekends and major holidays. You have access to a nurse at all times for urgent questions. Please call the main number to the clinic [phone] and follow the prompts.  For any non-urgent questions, you may also contact your provider using MyChart. We now offer e-Visits for anyone 18 and older to request care online for non-urgent symptoms. For details visit [email or web address].   Also download the MyChart app! Go to the app store, search "MyChart", open the app, select , and log in with your MyChart username and password.  Masks are optional in the cancer centers. If you would like for your care team to wear a mask while they are taking care of you, please let them know. You may have one support person who is at least 86 years old accompany you for your appointments.  

## 2022-04-06 NOTE — Progress Notes (Signed)
History of Present Illness:    This man presents for evaluation and management of new left scrotal swelling.  He has been seen here in the past for the following issues:   Urinary retention/urethral trauma  5.2.2023: Here for follow-up of hematuria/retention.   He developed gross hematuria following placement of a Foley catheter about a week ago.  Apparently the balloon was inflated within his prostate.  He had immediate pain and gross hematuria.  He came here and catheter was replaced, bladder was irrigated, and his urine has been clear since.   He feels that his retention was secondary to constipation.  His bowels are acting normally now.  He was not placed on any alpha blockers.   5.16.2023: Voiding better.     History of elevated PSA.   He underwent ultrasound biopsy of his prostate in January 2016.  At that time PSA was 8.74 with percent free 16.  Prostatic volume was 61 mL, PSA density 0.14.  There was 1 core at the right base which revealed atypia.  All of the cores were benign.   Seen in October 2020.  At that time PSA was 13.3.   3.15.2022: PSA 14.8   3.21.2023:Here for routine follow-up. No recent PSA. 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  He has had no real urinary symptoms.  He has a good stream and feels like he empties well.     7.11.2023: About a week ago he noted swelling and minimal tenderness of his left testicle.  This has persisted.  He has had no gross hematuria or dysuria.  He has had no fever or chills.  No right-sided complaints.  He was sent for scrotal ultrasound, this was never scheduled.  Urine culture did grow Klebsiella and he was treated with 5 days of Bactrim DS.  8.29.2023: Here for recheck.  He has had decreased swelling of his left hemiscrotum.  No pain, no urinary tract symptoms.  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   Hypertension    Port-A-Cath in place 10/15/2021     Past Surgical History:  Procedure Laterality Date   CARPAL TUNNEL RELEASE Right    COLONOSCOPY N/A 09/19/2013   Procedure: COLONOSCOPY;  Surgeon: Rogene Houston, MD;  Location: [facility];  Service: Endoscopy;  Laterality: N/A;  830   FINGER AMPUTATION Left    4th and 5th   left eye blindness Left    PORTACATH PLACEMENT Right 10/16/2021   Procedure: INSERTION PORT-A-CATH;  Surgeon: Aviva Signs, MD;  Location: [facility];  Service: General;  Laterality: Right;   SVT ABLATION N/A 09/21/2018   Procedure: SVT ABLATION;  Surgeon: Constance Haw, MD;  Location: [facility];  Service: Cardiovascular;  Laterality: N/A;    Home Medications:  Allergies as of 04/06/2022       Reactions   Aspirin Other (See Comments)   Gi upset, can tolerate baby aspirin   Flomax [tamsulosin] Nausea Only        Medication List        Accurate as of April 06, 2022  8:35 AM. If you have any questions, ask your nurse or doctor.          albuterol 108 (90 Base) MCG/ACT inhaler Commonly known as: VENTOLIN HFA Inhale 2 puffs into the lungs every 6 (six) hours as needed for wheezing or shortness of breath.   alfuzosin 10  MG 24 hr tablet Commonly known as: UROXATRAL Take 1 tablet (10 mg total) by mouth daily with breakfast.   azaCITIDine 5 mg/2 mLs in lactated ringers infusion Inject 75 mg/m2 into the vein daily. Days 1-5 every 28 days   cholecalciferol 1000 units tablet Commonly known as: VITAMIN D Take 1,000 Units by mouth daily.   cloNIDine 0.1 MG tablet Commonly known as: CATAPRES Take 0.1 mg by mouth daily.   cyanocobalamin 1000 MCG tablet Commonly known as: VITAMIN B12 Take 1,000 mcg by mouth daily.   docusate sodium 100 MG capsule Commonly known as: Colace Take 1 capsule (100 mg total) by mouth 2 (two) times daily.   furosemide 20 MG tablet Commonly known as: LASIX Take 1 tablet (20 mg total) by mouth daily as needed (take as needed every morning for ankle  swelling).   hydrochlorothiazide 25 MG tablet Commonly known as: HYDRODIURIL Take 25 mg by mouth daily.   levocetirizine 5 MG tablet Commonly known as: XYZAL Take 5 mg by mouth daily.   lidocaine-prilocaine cream Commonly known as: EMLA Apply a dime-sized amount to port a cath site (do not rub in) and cover with plastic wrap one hour prior to infusion appointments   lisinopril 40 MG tablet Commonly known as: ZESTRIL Take 40 mg by mouth daily.   megestrol 400 MG/10ML suspension Commonly known as: MEGACE Take 10 mLs (400 mg total) by mouth 2 (two) times daily.   pantoprazole 40 MG tablet Commonly known as: PROTONIX Take 1 tablet (40 mg total) by mouth daily.   prochlorperazine 10 MG tablet Commonly known as: COMPAZINE TAKE (1) TABLET BY MOUTH EVERY SIX HOURS AS NEEDED.   sulfamethoxazole-trimethoprim 800-160 MG tablet Commonly known as: BACTRIM DS Take 1 tablet by mouth 2 (two) times daily.        Allergies:  Allergies  Allergen Reactions   Aspirin Other (See Comments)    Gi upset, can tolerate baby aspirin   Flomax [Tamsulosin] Nausea Only    Family History  Problem Relation Age of Onset   CAD Mother    CAD Father    Cancer Brother     Social History:  reports that he quit smoking about 42 years ago. His smoking use included cigarettes. He started smoking about 65 years ago. He smoked an average of .5 packs per day. He has never used smokeless tobacco. He reports that he does not drink alcohol and does not use drugs.  ROS: A complete review of systems was performed.  All systems are negative except for pertinent findings as noted.  Physical Exam:  Vital signs in last 24 hours: There were no vitals taken for this visit. Constitutional:  Alert and oriented, No acute distress Cardiovascular: Regular rate  Respiratory: Normal respiratory effor Genitourinary: Phallus normal.  Testicles atrophied bilaterally.  No significant left scrotal hydrocele.  Mild  induration of left epididymis but no tenderness. Lymphatic: No lymphadenopathy Neurologic: Grossly intact, no focal deficits Psychiatric: Normal mood and affect  I have reviewed prior pt notes  I have reviewed urinalysis results   I have reviewed prior PSA results-we are not following this anymore  I have reviewed prior urine culture--Klebsiella from 02/16/2022   Impression/Assessment:  1.  History of elevated PSA, in this 88 year old male with other medical conditions, we are not following any more  2.  Probable epididymitis with reactive hydrocele, resolved  Plan:  1.  I reassured him about his exam.  No further follow-up needed for his scrotal issues  2.  I will have him come back to see me on an as-needed basis

## 2022-04-06 NOTE — Progress Notes (Signed)
Patients port flushed without difficulty.  Good blood return noted with no bruising or swelling noted at site.  Stable during access and blood draw.  Patient remaining accessed for possible blood transfusion.

## 2022-04-07 ENCOUNTER — Inpatient Hospital Stay: Payer: Medicare HMO

## 2022-04-10 ENCOUNTER — Other Ambulatory Visit: Payer: Self-pay | Admitting: Hematology

## 2022-04-10 DIAGNOSIS — Z95828 Presence of other vascular implants and grafts: Secondary | ICD-10-CM

## 2022-04-10 DIAGNOSIS — C9310 Chronic myelomonocytic leukemia not having achieved remission: Secondary | ICD-10-CM

## 2022-04-14 ENCOUNTER — Inpatient Hospital Stay: Payer: Medicare HMO | Attending: Hematology

## 2022-04-14 ENCOUNTER — Inpatient Hospital Stay: Payer: Medicare HMO

## 2022-04-14 VITALS — BP 139/65 | HR 73 | Temp 96.0°F | Resp 18

## 2022-04-14 DIAGNOSIS — R634 Abnormal weight loss: Secondary | ICD-10-CM | POA: Insufficient documentation

## 2022-04-14 DIAGNOSIS — D469 Myelodysplastic syndrome, unspecified: Secondary | ICD-10-CM

## 2022-04-14 DIAGNOSIS — I10 Essential (primary) hypertension: Secondary | ICD-10-CM | POA: Insufficient documentation

## 2022-04-14 DIAGNOSIS — M25473 Effusion, unspecified ankle: Secondary | ICD-10-CM | POA: Diagnosis not present

## 2022-04-14 DIAGNOSIS — M129 Arthropathy, unspecified: Secondary | ICD-10-CM | POA: Diagnosis not present

## 2022-04-14 DIAGNOSIS — C9310 Chronic myelomonocytic leukemia not having achieved remission: Secondary | ICD-10-CM

## 2022-04-14 DIAGNOSIS — R5383 Other fatigue: Secondary | ICD-10-CM | POA: Diagnosis not present

## 2022-04-14 DIAGNOSIS — M7989 Other specified soft tissue disorders: Secondary | ICD-10-CM | POA: Diagnosis not present

## 2022-04-14 DIAGNOSIS — Z79899 Other long term (current) drug therapy: Secondary | ICD-10-CM | POA: Insufficient documentation

## 2022-04-14 LAB — COMPREHENSIVE METABOLIC PANEL
ALT: 12 U/L (ref 0–44)
AST: 15 U/L (ref 15–41)
Albumin: 3.1 g/dL — ABNORMAL LOW (ref 3.5–5.0)
Alkaline Phosphatase: 50 U/L (ref 38–126)
Anion gap: 3 — ABNORMAL LOW (ref 5–15)
BUN: 25 mg/dL — ABNORMAL HIGH (ref 8–23)
CO2: 26 mmol/L (ref 22–32)
Calcium: 8.4 mg/dL — ABNORMAL LOW (ref 8.9–10.3)
Chloride: 112 mmol/L — ABNORMAL HIGH (ref 98–111)
Creatinine, Ser: 1.09 mg/dL (ref 0.61–1.24)
GFR, Estimated: >60 mL/min (ref >60)
Glucose, Bld: 111 mg/dL — ABNORMAL HIGH (ref 70–99)
Potassium: 3.4 mmol/L — ABNORMAL LOW (ref 3.5–5.1)
Sodium: 141 mmol/L (ref 135–145)
Total Bilirubin: 0.7 mg/dL (ref 0.3–1.2)
Total Protein: 5.7 g/dL — ABNORMAL LOW (ref 6.5–8.1)

## 2022-04-14 LAB — CBC WITH DIFFERENTIAL/PLATELET
Abs Immature Granulocytes: 1.1 10*3/uL — ABNORMAL HIGH (ref 0.00–0.07)
Basophils Absolute: 0 10*3/uL (ref 0.0–0.1)
Basophils Relative: 0 %
Eosinophils Absolute: 0.1 10*3/uL (ref 0.0–0.5)
Eosinophils Relative: 1 %
HCT: 20 % — ABNORMAL LOW (ref 39.0–52.0)
Hemoglobin: 6.5 g/dL — CL (ref 13.0–17.0)
Lymphocytes Relative: 34 %
Lymphs Abs: 2.5 10*3/uL (ref 0.7–4.0)
MCH: 29.5 pg (ref 26.0–34.0)
MCHC: 32.5 g/dL (ref 30.0–36.0)
MCV: 90.9 fL (ref 80.0–100.0)
Metamyelocytes Relative: 12 %
Monocytes Absolute: 1.3 10*3/uL — ABNORMAL HIGH (ref 0.1–1.0)
Monocytes Relative: 18 %
Myelocytes: 2 %
Neutro Abs: 2.4 10*3/uL (ref 1.7–7.7)
Neutrophils Relative %: 32 %
Platelets: 24 10*3/uL — CL (ref 150–400)
Promyelocytes Relative: 1 %
RBC: 2.2 MIL/uL — ABNORMAL LOW (ref 4.22–5.81)
RDW: 15.4 % (ref 11.5–15.5)
WBC: 7.4 10*3/uL (ref 4.0–10.5)
nRBC: 0.5 % — ABNORMAL HIGH (ref 0.0–0.2)

## 2022-04-14 LAB — PREPARE RBC (CROSSMATCH)

## 2022-04-14 LAB — MAGNESIUM: Magnesium: 1.9 mg/dL (ref 1.7–2.4)

## 2022-04-14 MED ORDER — ACETAMINOPHEN 325 MG PO TABS
650.0000 mg | ORAL_TABLET | Freq: Once | ORAL | Status: AC
  Administered 2022-04-14: 650 mg via ORAL
  Filled 2022-04-14: qty 2

## 2022-04-14 MED ORDER — HEPARIN SOD (PORK) LOCK FLUSH 100 UNIT/ML IV SOLN
500.0000 [IU] | Freq: Every day | INTRAVENOUS | Status: AC | PRN
  Administered 2022-04-14: 500 [IU]

## 2022-04-14 MED ORDER — DIPHENHYDRAMINE HCL 25 MG PO CAPS
25.0000 mg | ORAL_CAPSULE | Freq: Once | ORAL | Status: AC
  Administered 2022-04-14: 25 mg via ORAL
  Filled 2022-04-14: qty 1

## 2022-04-14 MED ORDER — SODIUM CHLORIDE 0.9% FLUSH
10.0000 mL | INTRAVENOUS | Status: AC | PRN
  Administered 2022-04-14: 10 mL

## 2022-04-14 MED ORDER — SODIUM CHLORIDE 0.9% IV SOLUTION
250.0000 mL | Freq: Once | INTRAVENOUS | Status: AC
  Administered 2022-04-14: 250 mL via INTRAVENOUS

## 2022-04-14 NOTE — Progress Notes (Signed)
Patients port flushed without difficulty.  Good blood return noted with no bruising or swelling noted at site.  Patient remains accessed for possible blood products.

## 2022-04-14 NOTE — Patient Instructions (Signed)
[facility]  Discharge Instructions: Thank you for choosing [facility] to provide your oncology and hematology care.  If you have a lab appointment with the Cancer Center, please come in thru the Main Entrance and check in at the main information desk.  Wear comfortable clothing and clothing appropriate for easy access to any Portacath or PICC line.   We strive to give you quality time with your provider. You may need to reschedule your appointment if you arrive late (15 or more minutes).  Arriving late affects you and other patients whose appointments are after yours.  Also, if you miss three or more appointments without notifying the office, you may be dismissed from the clinic at the provider's discretion.      For prescription refill requests, have your pharmacy contact our office and allow 72 hours for refills to be completed.     To help prevent nausea and vomiting after your treatment, we encourage you to take your nausea medication as directed.  BELOW ARE SYMPTOMS THAT SHOULD BE REPORTED IMMEDIATELY: *FEVER GREATER THAN 100.4 F (38 C) OR HIGHER *CHILLS OR SWEATING *NAUSEA AND VOMITING THAT IS NOT CONTROLLED WITH YOUR NAUSEA MEDICATION *UNUSUAL SHORTNESS OF BREATH *UNUSUAL BRUISING OR BLEEDING *URINARY PROBLEMS (pain or burning when urinating, or frequent urination) *BOWEL PROBLEMS (unusual diarrhea, constipation, pain near the anus) TENDERNESS IN MOUTH AND THROAT WITH OR WITHOUT PRESENCE OF ULCERS (sore throat, sores in mouth, or a toothache) UNUSUAL RASH, SWELLING OR PAIN  UNUSUAL VAGINAL DISCHARGE OR ITCHING   Items with * indicate a potential emergency and should be followed up as soon as possible or go to the Emergency Department if any problems should occur.  Please show the CHEMOTHERAPY ALERT CARD or IMMUNOTHERAPY ALERT CARD at check-in to the Emergency Department and triage nurse.  Should you have questions after your visit or need to  cancel or reschedule your appointment, please contact [facility] [phone]  and follow the prompts.  Office hours are 8:00 a.m. to 4:30 p.m. Monday - Friday. Please note that voicemails left after 4:00 p.m. may not be returned until the following business day.  We are closed weekends and major holidays. You have access to a nurse at all times for urgent questions. Please call the main number to the clinic [phone] and follow the prompts.  For any non-urgent questions, you may also contact your provider using MyChart. We now offer e-Visits for anyone 18 and older to request care online for non-urgent symptoms. For details visit [email or web address].   Also download the MyChart app! Go to the app store, search "MyChart", open the app, select [facility], and log in with your MyChart username and password.  Masks are optional in the cancer centers. If you would like for your care team to wear a mask while they are taking care of you, please let them know. You may have one support person who is at least 86 years old accompany you for your appointments.  

## 2022-04-14 NOTE — Progress Notes (Signed)
Patient presents today for possible blood transfusion.  Patient is in satisfactory condition with no complaints voiced.  Vital signs are stable.    Hemoglobin today is 6.5 and platelets are 24.  Patient will receive one unit of PRBC today per standing orders by Dr. Delton Coombes.  We will proceed with transfusion per MD orders.   Patient tolerated transfusion well with no complaints voiced.  Patient left ambulatory in stable condition.  Vital signs stable at discharge.  Follow up as scheduled.

## 2022-04-14 NOTE — Progress Notes (Signed)
CRITICAL VALUE ALERT Critical value received:  HGB 6.5, platelets 24. Date of notification:  04-15-2023 Time of notification: 09:32 am.  Critical value read back:  Yes.   Nurse who received alert:  B. Ameet Sandy RN MD notified time and response:  Katragadda @ 09:50 am. Standing orders in place. Patient will receive 1 unit of blood today.

## 2022-04-15 ENCOUNTER — Inpatient Hospital Stay: Payer: Medicare HMO | Admitting: Dietician

## 2022-04-15 ENCOUNTER — Inpatient Hospital Stay: Payer: Medicare HMO

## 2022-04-15 LAB — TYPE AND SCREEN
ABO/RH(D): B POS
Antibody Screen: NEGATIVE
Unit division: 0

## 2022-04-15 LAB — BPAM RBC
Blood Product Expiration Date: 202309082359
ISSUE DATE / TIME: 202309061046
Unit Type and Rh: 7300

## 2022-04-19 ENCOUNTER — Inpatient Hospital Stay: Payer: Medicare HMO | Admitting: Dietician

## 2022-04-19 ENCOUNTER — Telehealth: Payer: Self-pay | Admitting: Dietician

## 2022-04-19 NOTE — Telephone Encounter (Signed)
Nutrition Follow-up:  Patient with chronic myelomonocytic leukemia. He is receiving azacitidine q28d  Spoke with patient via telephone. He reports appetite is not as good as it has been. Patient has not been taking Megace recently. He needs to go pick this up from the pharmacy. Patient is drinking 2 Boost VHC everyday. States his daughter makes sure of that. Patient reports his ankles have been sore and needs to make an appointment with the foot doctor. He continues to have lower extremity swelling. Patient says he is taking Lasix every morning.   Medications: reviewed   Labs: reviewed   Anthropometrics: No new weights at this time. Last weight 153 lb 6.4 oz on 8/15  8/7 - 153 lb 7/24 - 141 lb 14.4 oz    NUTRITION DIAGNOSIS: Moderate malnutrition continues    INTERVENTION:  Continue drinking 2 Boost VHC daily Patient will restart Megace after picking up from pharmacy Encouraged keeping feet elevated as much as possible     MONITORING, EVALUATION, GOAL: weight trends, intake    NEXT VISIT: Monday October 2 during infusion

## 2022-04-21 ENCOUNTER — Inpatient Hospital Stay: Payer: Medicare HMO

## 2022-04-21 DIAGNOSIS — C9310 Chronic myelomonocytic leukemia not having achieved remission: Secondary | ICD-10-CM

## 2022-04-21 DIAGNOSIS — D469 Myelodysplastic syndrome, unspecified: Secondary | ICD-10-CM

## 2022-04-21 LAB — CBC WITH DIFFERENTIAL/PLATELET
Abs Immature Granulocytes: 0.2 K/uL — ABNORMAL HIGH (ref 0.00–0.07)
Band Neutrophils: 2 %
Basophils Absolute: 0 K/uL (ref 0.0–0.1)
Basophils Relative: 0 %
Blasts: 2 %
Eosinophils Absolute: 0.1 K/uL (ref 0.0–0.5)
Eosinophils Relative: 1 %
HCT: 19.5 % — ABNORMAL LOW (ref 39.0–52.0)
Hemoglobin: 6.2 g/dL — CL (ref 13.0–17.0)
Lymphocytes Relative: 35 %
Lymphs Abs: 3.3 K/uL (ref 0.7–4.0)
MCH: 29.2 pg (ref 26.0–34.0)
MCHC: 31.8 g/dL (ref 30.0–36.0)
MCV: 92 fL (ref 80.0–100.0)
Metamyelocytes Relative: 2 %
Monocytes Absolute: 2.5 K/uL — ABNORMAL HIGH (ref 0.1–1.0)
Monocytes Relative: 27 %
Neutro Abs: 3.1 K/uL (ref 1.7–7.7)
Neutrophils Relative %: 31 %
Platelets: 22 K/uL — CL (ref 150–400)
RBC: 2.12 MIL/uL — ABNORMAL LOW (ref 4.22–5.81)
RDW: 15.8 % — ABNORMAL HIGH (ref 11.5–15.5)
WBC: 9.3 K/uL (ref 4.0–10.5)
nRBC: 2 % — ABNORMAL HIGH (ref 0.0–0.2)

## 2022-04-21 LAB — SAMPLE TO BLOOD BANK

## 2022-04-21 LAB — PREPARE RBC (CROSSMATCH)

## 2022-04-21 MED ORDER — SODIUM CHLORIDE 0.9% IV SOLUTION
250.0000 mL | Freq: Once | INTRAVENOUS | Status: AC
  Administered 2022-04-21: 250 mL via INTRAVENOUS

## 2022-04-21 MED ORDER — ACETAMINOPHEN 325 MG PO TABS
650.0000 mg | ORAL_TABLET | Freq: Once | ORAL | Status: AC
  Administered 2022-04-21: 650 mg via ORAL
  Filled 2022-04-21: qty 2

## 2022-04-21 MED ORDER — DIPHENHYDRAMINE HCL 25 MG PO CAPS
25.0000 mg | ORAL_CAPSULE | Freq: Once | ORAL | Status: AC
  Administered 2022-04-21: 25 mg via ORAL
  Filled 2022-04-21: qty 1

## 2022-04-21 MED ORDER — HEPARIN SOD (PORK) LOCK FLUSH 100 UNIT/ML IV SOLN
500.0000 [IU] | Freq: Every day | INTRAVENOUS | Status: AC | PRN
  Administered 2022-04-21: 500 [IU]

## 2022-04-21 MED ORDER — SODIUM CHLORIDE 0.9% FLUSH
10.0000 mL | INTRAVENOUS | Status: AC | PRN
  Administered 2022-04-21: 10 mL

## 2022-04-21 NOTE — Progress Notes (Signed)
CRITICAL VALUE ALERT Critical value received:  hgb 6.2, platelets 22 Date of notification:  04/21/22 Time of notification: 9470 Critical value read back:  Yes.   Nurse who received alert:  C. Asuncion Tapscott RN MD notified time and response:  Tarri Abernethy PA-C. Will give one unit of blood per orders.

## 2022-04-21 NOTE — Patient Instructions (Signed)
[facility]  Discharge Instructions: Thank you for choosing [facility] to provide your oncology and hematology care.  If you have a lab appointment with the Cancer Center, please come in thru the Main Entrance and check in at the main information desk.  Wear comfortable clothing and clothing appropriate for easy access to any Portacath or PICC line.   We strive to give you quality time with your provider. You may need to reschedule your appointment if you arrive late (15 or more minutes).  Arriving late affects you and other patients whose appointments are after yours.  Also, if you miss three or more appointments without notifying the office, you may be dismissed from the clinic at the provider's discretion.      For prescription refill requests, have your pharmacy contact our office and allow 72 hours for refills to be completed.     To help prevent nausea and vomiting after your treatment, we encourage you to take your nausea medication as directed.  BELOW ARE SYMPTOMS THAT SHOULD BE REPORTED IMMEDIATELY: *FEVER GREATER THAN 100.4 F (38 C) OR HIGHER *CHILLS OR SWEATING *NAUSEA AND VOMITING THAT IS NOT CONTROLLED WITH YOUR NAUSEA MEDICATION *UNUSUAL SHORTNESS OF BREATH *UNUSUAL BRUISING OR BLEEDING *URINARY PROBLEMS (pain or burning when urinating, or frequent urination) *BOWEL PROBLEMS (unusual diarrhea, constipation, pain near the anus) TENDERNESS IN MOUTH AND THROAT WITH OR WITHOUT PRESENCE OF ULCERS (sore throat, sores in mouth, or a toothache) UNUSUAL RASH, SWELLING OR PAIN  UNUSUAL VAGINAL DISCHARGE OR ITCHING   Items with * indicate a potential emergency and should be followed up as soon as possible or go to the Emergency Department if any problems should occur.  Please show the CHEMOTHERAPY ALERT CARD or IMMUNOTHERAPY ALERT CARD at check-in to the Emergency Department and triage nurse.  Should you have questions after your visit or need to  cancel or reschedule your appointment, please contact [facility] [phone]  and follow the prompts.  Office hours are 8:00 a.m. to 4:30 p.m. Monday - Friday. Please note that voicemails left after 4:00 p.m. may not be returned until the following business day.  We are closed weekends and major holidays. You have access to a nurse at all times for urgent questions. Please call the main number to the clinic [phone] and follow the prompts.  For any non-urgent questions, you may also contact your provider using MyChart. We now offer e-Visits for anyone 18 and older to request care online for non-urgent symptoms. For details visit [email or web address].   Also download the MyChart app! Go to the app store, search "MyChart", open the app, select , and log in with your MyChart username and password.  Masks are optional in the cancer centers. If you would like for your care team to wear a mask while they are taking care of you, please let them know. You may have one support person who is at least 86 years old accompany you for your appointments.  

## 2022-04-21 NOTE — Progress Notes (Signed)
Patient presents today for one unit of PRBC.  Patient is in satisfactory condition with no new complaints voiced.  Vital signs are stable.  Hemoglobin today is 6.2.  We will proceed with transfusion per provider orders.  Patient tolerated transfusion well with no complaints voiced.  Patient left ambulatory in stable condition.  Vital signs stable at discharge.  Follow up as scheduled.

## 2022-04-22 ENCOUNTER — Inpatient Hospital Stay: Payer: Medicare HMO

## 2022-04-22 LAB — BPAM RBC
Blood Product Expiration Date: 202310112359
ISSUE DATE / TIME: 202309131228
Unit Type and Rh: 1700

## 2022-04-22 LAB — TYPE AND SCREEN
ABO/RH(D): B POS
Antibody Screen: NEGATIVE
Unit division: 0

## 2022-04-28 ENCOUNTER — Inpatient Hospital Stay: Payer: Medicare HMO

## 2022-04-28 VITALS — BP 140/77 | HR 77 | Temp 97.3°F | Resp 16

## 2022-04-28 DIAGNOSIS — C9310 Chronic myelomonocytic leukemia not having achieved remission: Secondary | ICD-10-CM

## 2022-04-28 DIAGNOSIS — D469 Myelodysplastic syndrome, unspecified: Secondary | ICD-10-CM

## 2022-04-28 LAB — CBC WITH DIFFERENTIAL/PLATELET
Abs Immature Granulocytes: 1.3 10*3/uL — ABNORMAL HIGH (ref 0.00–0.07)
Basophils Absolute: 0 10*3/uL (ref 0.0–0.1)
Basophils Relative: 0 %
Blasts: 3 %
Eosinophils Absolute: 0.2 10*3/uL (ref 0.0–0.5)
Eosinophils Relative: 2 %
HCT: 19.6 % — ABNORMAL LOW (ref 39.0–52.0)
Hemoglobin: 6.5 g/dL — CL (ref 13.0–17.0)
Lymphocytes Relative: 34 %
Lymphs Abs: 2.9 10*3/uL (ref 0.7–4.0)
MCH: 30.1 pg (ref 26.0–34.0)
MCHC: 33.2 g/dL (ref 30.0–36.0)
MCV: 90.7 fL (ref 80.0–100.0)
Metamyelocytes Relative: 6 %
Monocytes Absolute: 2.5 10*3/uL — ABNORMAL HIGH (ref 0.1–1.0)
Monocytes Relative: 30 %
Myelocytes: 4 %
Neutro Abs: 1.3 10*3/uL — ABNORMAL LOW (ref 1.7–7.7)
Neutrophils Relative %: 16 %
Platelets: 24 10*3/uL — CL (ref 150–400)
Promyelocytes Relative: 5 %
RBC: 2.16 MIL/uL — ABNORMAL LOW (ref 4.22–5.81)
RDW: 16.1 % — ABNORMAL HIGH (ref 11.5–15.5)
WBC: 8.4 10*3/uL (ref 4.0–10.5)
nRBC: 2.5 % — ABNORMAL HIGH (ref 0.0–0.2)

## 2022-04-28 LAB — COMPREHENSIVE METABOLIC PANEL
ALT: 11 U/L (ref 0–44)
AST: 16 U/L (ref 15–41)
Albumin: 2.9 g/dL — ABNORMAL LOW (ref 3.5–5.0)
Alkaline Phosphatase: 50 U/L (ref 38–126)
Anion gap: 6 (ref 5–15)
BUN: 25 mg/dL — ABNORMAL HIGH (ref 8–23)
CO2: 25 mmol/L (ref 22–32)
Calcium: 8.5 mg/dL — ABNORMAL LOW (ref 8.9–10.3)
Chloride: 111 mmol/L (ref 98–111)
Creatinine, Ser: 1.13 mg/dL (ref 0.61–1.24)
GFR, Estimated: >60 mL/min (ref >60)
Glucose, Bld: 92 mg/dL (ref 70–99)
Potassium: 3.7 mmol/L (ref 3.5–5.1)
Sodium: 142 mmol/L (ref 135–145)
Total Bilirubin: 0.6 mg/dL (ref 0.3–1.2)
Total Protein: 5.8 g/dL — ABNORMAL LOW (ref 6.5–8.1)

## 2022-04-28 LAB — PREPARE RBC (CROSSMATCH)

## 2022-04-28 LAB — MAGNESIUM: Magnesium: 1.9 mg/dL (ref 1.7–2.4)

## 2022-04-28 MED ORDER — SODIUM CHLORIDE 0.9% IV SOLUTION
250.0000 mL | Freq: Once | INTRAVENOUS | Status: AC
  Administered 2022-04-28: 250 mL via INTRAVENOUS

## 2022-04-28 MED ORDER — DIPHENHYDRAMINE HCL 25 MG PO CAPS
25.0000 mg | ORAL_CAPSULE | Freq: Once | ORAL | Status: AC
  Administered 2022-04-28: 25 mg via ORAL
  Filled 2022-04-28: qty 1

## 2022-04-28 MED ORDER — SODIUM CHLORIDE 0.9% FLUSH
10.0000 mL | INTRAVENOUS | Status: AC | PRN
  Administered 2022-04-28: 10 mL

## 2022-04-28 MED ORDER — HEPARIN SOD (PORK) LOCK FLUSH 100 UNIT/ML IV SOLN
500.0000 [IU] | Freq: Every day | INTRAVENOUS | Status: AC | PRN
  Administered 2022-04-28: 500 [IU]

## 2022-04-28 MED ORDER — ACETAMINOPHEN 325 MG PO TABS
650.0000 mg | ORAL_TABLET | Freq: Once | ORAL | Status: AC
  Administered 2022-04-28: 650 mg via ORAL
  Filled 2022-04-28: qty 2

## 2022-04-28 NOTE — Progress Notes (Signed)
Patients port flushed without difficulty.  Good blood return noted with no bruising or swelling noted at site.  Patient remains accessed for possible blood transfusion.  

## 2022-04-28 NOTE — Progress Notes (Signed)
CRITICAL VALUE ALERT Critical value received:  hgb 6.5, platelets 24 Date of notification:  04/28/22 Time of notification: 04/28/22 Critical value read back:  Yes.   Nurse who received alert:  C. Pierrette Scheu RN MD notified time and response:  [phone], Dr. Alen Blew.  Will give one unit of blood per MD.

## 2022-04-28 NOTE — Patient Instructions (Signed)
[facility]  Discharge Instructions: Thank you for choosing [facility] to provide your oncology and hematology care.  If you have a lab appointment with the [facility], please come in thru the Main Entrance and check in at the main information desk.  Wear comfortable clothing and clothing appropriate for easy access to any Portacath or PICC line.   We strive to give you quality time with your provider. You may need to reschedule your appointment if you arrive late (15 or more minutes).  Arriving late affects you and other patients whose appointments are after yours.  Also, if you miss three or more appointments without notifying the office, you may be dismissed from the clinic at the provider's discretion.      For prescription refill requests, have your pharmacy contact our office and allow 72 hours for refills to be completed.    Today you received the following, one unit of blood today   To help prevent nausea and vomiting after your treatment, we encourage you to take your nausea medication as directed.  BELOW ARE SYMPTOMS THAT SHOULD BE REPORTED IMMEDIATELY: *FEVER GREATER THAN 100.4 F (38 C) OR HIGHER *CHILLS OR SWEATING *NAUSEA AND VOMITING THAT IS NOT CONTROLLED WITH YOUR NAUSEA MEDICATION *UNUSUAL SHORTNESS OF BREATH *UNUSUAL BRUISING OR BLEEDING *URINARY PROBLEMS (pain or burning when urinating, or frequent urination) *BOWEL PROBLEMS (unusual diarrhea, constipation, pain near the anus) TENDERNESS IN MOUTH AND THROAT WITH OR WITHOUT PRESENCE OF ULCERS (sore throat, sores in mouth, or a toothache) UNUSUAL RASH, SWELLING OR PAIN  UNUSUAL VAGINAL DISCHARGE OR ITCHING   Items with * indicate a potential emergency and should be followed up as soon as possible or go to the Emergency Department if any problems should occur.  Please show the CHEMOTHERAPY ALERT CARD or IMMUNOTHERAPY ALERT CARD at check-in to the Emergency Department and triage  nurse.  Should you have questions after your visit or need to cancel or reschedule your appointment, please contact [facility] [phone]  and follow the prompts.  Office hours are 8:00 a.m. to 4:30 p.m. Monday - Friday. Please note that voicemails left after 4:00 p.m. may not be returned until the following business day.  We are closed weekends and major holidays. You have access to a nurse at all times for urgent questions. Please call the main number to the clinic [phone] and follow the prompts.  For any non-urgent questions, you may also contact your provider using MyChart. We now offer e-Visits for anyone 55 and older to request care online for non-urgent symptoms. For details visit [email or web address].   Also download the MyChart app! Go to the app store, search "MyChart", open the app, select [facility], and log in with your MyChart username and password.  Masks are optional in the cancer centers. If you would like for your care team to wear a mask while they are taking care of you, please let them know. You may have one support person who is at least 86 years old accompany you for your appointments.

## 2022-04-28 NOTE — Progress Notes (Signed)
One unit of blood given per orders. Patient tolerated it well without problems. Vitals stable and discharged home from clinic ambulatory. Follow up as scheduled.  

## 2022-04-29 ENCOUNTER — Inpatient Hospital Stay: Payer: Medicare HMO

## 2022-04-29 LAB — TYPE AND SCREEN
ABO/RH(D): B POS
Antibody Screen: NEGATIVE
Unit division: 0

## 2022-04-29 LAB — BPAM RBC
Blood Product Expiration Date: 202310262359
ISSUE DATE / TIME: 202309201019
Unit Type and Rh: 1700

## 2022-05-03 ENCOUNTER — Inpatient Hospital Stay: Payer: Medicare HMO

## 2022-05-03 ENCOUNTER — Inpatient Hospital Stay (HOSPITAL_BASED_OUTPATIENT_CLINIC_OR_DEPARTMENT_OTHER): Payer: Medicare HMO | Admitting: Hematology

## 2022-05-03 ENCOUNTER — Encounter: Payer: Self-pay | Admitting: Hematology

## 2022-05-03 DIAGNOSIS — C9310 Chronic myelomonocytic leukemia not having achieved remission: Secondary | ICD-10-CM

## 2022-05-03 DIAGNOSIS — Z95828 Presence of other vascular implants and grafts: Secondary | ICD-10-CM

## 2022-05-03 LAB — CBC WITH DIFFERENTIAL/PLATELET
Abs Immature Granulocytes: 0.4 10*3/uL — ABNORMAL HIGH (ref 0.00–0.07)
Band Neutrophils: 2 %
Basophils Absolute: 0 10*3/uL (ref 0.0–0.1)
Basophils Relative: 0 %
Blasts: 2 %
Eosinophils Absolute: 0.1 10*3/uL (ref 0.0–0.5)
Eosinophils Relative: 1 %
HCT: 20.9 % — ABNORMAL LOW (ref 39.0–52.0)
Hemoglobin: 6.8 g/dL — CL (ref 13.0–17.0)
Lymphocytes Relative: 42 %
Lymphs Abs: 3.3 10*3/uL (ref 0.7–4.0)
MCH: 29.7 pg (ref 26.0–34.0)
MCHC: 32.5 g/dL (ref 30.0–36.0)
MCV: 91.3 fL (ref 80.0–100.0)
Metamyelocytes Relative: 3 %
Monocytes Absolute: 2.5 10*3/uL — ABNORMAL HIGH (ref 0.1–1.0)
Monocytes Relative: 32 %
Myelocytes: 2 %
Neutro Abs: 1.4 10*3/uL — ABNORMAL LOW (ref 1.7–7.7)
Neutrophils Relative %: 16 %
Platelets: 22 10*3/uL — CL (ref 150–400)
RBC: 2.29 MIL/uL — ABNORMAL LOW (ref 4.22–5.81)
RDW: 16.3 % — ABNORMAL HIGH (ref 11.5–15.5)
WBC: 7.9 10*3/uL (ref 4.0–10.5)
nRBC: 2 /100 WBC — ABNORMAL HIGH
nRBC: 2.3 % — ABNORMAL HIGH (ref 0.0–0.2)

## 2022-05-03 LAB — COMPREHENSIVE METABOLIC PANEL
ALT: 11 U/L (ref 0–44)
AST: 14 U/L — ABNORMAL LOW (ref 15–41)
Albumin: 3 g/dL — ABNORMAL LOW (ref 3.5–5.0)
Alkaline Phosphatase: 51 U/L (ref 38–126)
Anion gap: 6 (ref 5–15)
BUN: 20 mg/dL (ref 8–23)
CO2: 23 mmol/L (ref 22–32)
Calcium: 8.3 mg/dL — ABNORMAL LOW (ref 8.9–10.3)
Chloride: 111 mmol/L (ref 98–111)
Creatinine, Ser: 1.02 mg/dL (ref 0.61–1.24)
GFR, Estimated: >60 mL/min (ref >60)
Glucose, Bld: 86 mg/dL (ref 70–99)
Potassium: 3.8 mmol/L (ref 3.5–5.1)
Sodium: 140 mmol/L (ref 135–145)
Total Bilirubin: 1 mg/dL (ref 0.3–1.2)
Total Protein: 5.6 g/dL — ABNORMAL LOW (ref 6.5–8.1)

## 2022-05-03 LAB — PREPARE RBC (CROSSMATCH)

## 2022-05-03 LAB — MAGNESIUM: Magnesium: 2.1 mg/dL (ref 1.7–2.4)

## 2022-05-03 MED ORDER — DIPHENHYDRAMINE HCL 25 MG PO CAPS
25.0000 mg | ORAL_CAPSULE | Freq: Once | ORAL | Status: AC
  Administered 2022-05-03: 25 mg via ORAL
  Filled 2022-05-03: qty 1

## 2022-05-03 MED ORDER — SODIUM CHLORIDE 0.9% IV SOLUTION
250.0000 mL | Freq: Once | INTRAVENOUS | Status: AC
  Administered 2022-05-03: 250 mL via INTRAVENOUS

## 2022-05-03 MED ORDER — SODIUM CHLORIDE 0.9% FLUSH
10.0000 mL | INTRAVENOUS | Status: AC | PRN
  Administered 2022-05-03: 10 mL

## 2022-05-03 MED ORDER — ACETAMINOPHEN 325 MG PO TABS
650.0000 mg | ORAL_TABLET | Freq: Once | ORAL | Status: AC
  Administered 2022-05-03: 650 mg via ORAL
  Filled 2022-05-03: qty 2

## 2022-05-03 MED ORDER — HEPARIN SOD (PORK) LOCK FLUSH 100 UNIT/ML IV SOLN
500.0000 [IU] | Freq: Every day | INTRAVENOUS | Status: AC | PRN
  Administered 2022-05-03: 500 [IU]

## 2022-05-03 NOTE — Patient Instructions (Signed)
[facility]  Discharge Instructions  You were seen and examined today by Dr. Delton Coombes.  Dr. Delton Coombes discussed your most recent lab work. Your platelets remain low, too low for treatment.  We will recheck your labs in one week and then do treatment if the platelets are above 45.  You will need a repeat BMBx after your next treatment.  Follow-up as scheduled.  Thank you for choosing [facility] to provide your oncology and hematology care.   To afford each patient quality time with our provider, please arrive at least 15 minutes before your scheduled appointment time. You may need to reschedule your appointment if you arrive late (10 or more minutes). Arriving late affects you and other patients whose appointments are after yours.  Also, if you miss three or more appointments without notifying the office, you may be dismissed from the clinic at the provider's discretion.    Again, thank you for choosing [facility].  Our hope is that these requests will decrease the amount of time that you wait before being seen by our physicians.   If you have a lab appointment with the [facility] please come in thru the Main Entrance and check in at the main information desk.           _____________________________________________________________  Should you have questions after your visit to [facility], please contact our office at [phone] and follow the prompts.  Our office hours are 8:00 a.m. to 4:30 p.m. Monday - Thursday and 8:00 a.m. to 2:30 p.m. Friday.  Please note that voicemails left after 4:00 p.m. may not be returned until the following business day.  We are closed weekends and all major holidays.  You do have access to a nurse 24-7, just call the main number to the clinic [phone] and do not press any options, hold on the line and a nurse will answer the phone.    For prescription refill  requests, have your pharmacy contact our office and allow 72 hours.    Masks are optional in the cancer centers. If you would like for your care team to wear a mask while they are taking care of you, please let them know. You may have one support person who is at least 86 years old accompany you for your appointments.

## 2022-05-03 NOTE — Progress Notes (Signed)
Patient has been assessed, vital signs and labs have been reviewed by Dr. Delton Coombes. Platelets 22, Hgb 6.8. Per MD, no treatment today. Orders received for 2 units PRBCs. Patient to be reassessed in one week and may proceed with treatment if platelets are greater than 45 at that time. Primary RN and pharmacy aware.

## 2022-05-03 NOTE — Progress Notes (Signed)
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  INTERVAL HISTORY:  [name], a 88 y.o. male, seen for follow-up of CMML and next cycle of azacitidine.  He received cycle 5 of azacitidine on 03/22/2022 with increased dose 75 mg per metered squared for 7 days.  He is drinking boost 1 can twice daily.  Reports ankle swelling since.  He is taking Lasix 20 mg daily.  Mild fatigue was also reported.  REVIEW OF SYSTEMS:  Review of Systems  Constitutional:  Positive for fatigue.  HENT:   Positive for trouble swallowing.   Respiratory:  Positive for shortness of breath.   Cardiovascular:  Positive for leg swelling.  Neurological:  Positive for dizziness.  All other systems reviewed and are negative.   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   Hypertension    Port-A-Cath in place 10/15/2021   Past Surgical History:  Procedure Laterality Date   CARPAL TUNNEL RELEASE Right    COLONOSCOPY N/A 09/19/2013   Procedure:  COLONOSCOPY;  Surgeon: Rogene Houston, MD;  Location: [facility];  Service: Endoscopy;  Laterality: N/A;  830   FINGER AMPUTATION Left    4th and 5th   left eye blindness Left    PORTACATH PLACEMENT Right 10/16/2021   Procedure: INSERTION PORT-A-CATH;  Surgeon: Aviva Signs, MD;  Location: [facility];  Service: General;  Laterality: Right;   SVT ABLATION N/A 09/21/2018   Procedure: SVT ABLATION;  Surgeon: Constance Haw, MD;  Location: [facility];  Service: Cardiovascular;  Laterality: N/A;    SOCIAL HISTORY:  Social History   Socioeconomic History   Marital status: Widowed    Spouse name: Not on file   Number of children: Not on file   Years of education: Not on file   Highest education level: Not on file  Occupational History   Not on file  Tobacco Use   Smoking status: Former    Packs/day: 0.50    Types: Cigarettes    Start date: 08/09/1956    Quit date: 08/10/1979    Years since quitting: 42.7   Smokeless tobacco: Never  Vaping Use   Vaping Use: Never used  Substance and Sexual Activity   Alcohol use: No    Alcohol/week: 0.0 standard drinks of alcohol   Drug use: No   Sexual activity: Not Currently  Other Topics Concern   Not on file  Social History Narrative   Not on file   Social  Determinants of Health   Financial Resource Strain: Not on file  Food Insecurity: Not on file  Transportation Needs: Not on file  Physical Activity: Not on file  Stress: Not on file  Social Connections: Not on file  Intimate Partner Violence: Not on file    FAMILY HISTORY:  Family History  Problem Relation Age of Onset   CAD Mother    CAD Father    Cancer Brother     CURRENT MEDICATIONS:  Current Outpatient Medications  Medication Sig Dispense Refill   albuterol (VENTOLIN HFA) 108 (90 Base) MCG/ACT inhaler Inhale 2 puffs into the lungs every 6 (six) hours as needed for wheezing or shortness of breath.     alfuzosin (UROXATRAL) 10 MG 24 hr tablet Take 1 tablet (10  mg total) by mouth daily with breakfast. 30 tablet 11   azaCITIDine 5 mg/2 mLs in lactated ringers infusion Inject 75 mg/m2 into the vein daily. Days 1-5 every 28 days     cholecalciferol (VITAMIN D) 1000 units tablet Take 1,000 Units by mouth daily.     docusate sodium (COLACE) 100 MG capsule Take 1 capsule (100 mg total) by mouth 2 (two) times daily. 60 capsule 1   furosemide (LASIX) 20 MG tablet Take 1 tablet (20 mg total) by mouth daily as needed (take as needed every morning for ankle swelling). 30 tablet 2   levocetirizine (XYZAL) 5 MG tablet Take 5 mg by mouth daily.     megestrol (MEGACE) 400 MG/10ML suspension Take 10 mLs (400 mg total) by mouth 2 (two) times daily. 480 mL 3   pantoprazole (PROTONIX) 40 MG tablet Take 1 tablet (40 mg total) by mouth daily. 30 tablet 1   sulfamethoxazole-trimethoprim (BACTRIM DS) 800-160 MG tablet Take 1 tablet by mouth 2 (two) times daily. 10 tablet 0   vitamin B-12 (CYANOCOBALAMIN) 1000 MCG tablet Take 1,000 mcg by mouth daily.     cloNIDine (CATAPRES) 0.1 MG tablet Take 0.1 mg by mouth daily. (Patient not taking: Reported on 04/21/2022)     hydrochlorothiazide (HYDRODIURIL) 25 MG tablet Take 25 mg by mouth daily. (Patient not taking: Reported on 05/03/2022)     lisinopril (PRINIVIL,ZESTRIL) 40 MG tablet Take 40 mg by mouth daily. (Patient not taking: Reported on 05/03/2022)     No current facility-administered medications for this visit.   Facility-Administered Medications Ordered in Other Visits  Medication Dose Route Frequency Provider Last Rate Last Admin   0.9 %  sodium chloride infusion (Manually program via Guardrails IV Fluids)  250 mL Intravenous Once Derek Jack, MD       acetaminophen (TYLENOL) 325 MG tablet            diphenhydrAMINE (BENADRYL) 25 mg capsule            palonosetron (ALOXI) 0.25 MG/5ML injection             ALLERGIES:  Allergies  Allergen Reactions   Aspirin Other (See Comments)    Gi upset, can tolerate baby  aspirin   Flomax [Tamsulosin] Nausea Only    PHYSICAL EXAM:  Performance status (ECOG): 1 - Symptomatic but completely ambulatory  There were no vitals filed for this visit.   Wt Readings from Last 3 Encounters:  05/03/22 163 lb (73.9 kg)  03/23/22 153 lb 6.4 oz (69.6 kg)  03/22/22 152 lb 6.4 oz (69.1 kg)   Physical Exam Vitals reviewed.  Constitutional:      Appearance: Normal appearance.  Cardiovascular:     Rate  and Rhythm: Normal rate and regular rhythm.     Pulses: Normal pulses.     Heart sounds: Normal heart sounds.  Pulmonary:     Effort: Pulmonary effort is normal.     Breath sounds: Normal breath sounds.  Abdominal:     Palpations: Abdomen is soft. There is no mass.     Tenderness: There is no abdominal tenderness.  Musculoskeletal:     Right lower leg: 1+ Edema present.     Left lower leg: 1+ Edema present.  Neurological:     General: No focal deficit present.     Mental Status: He is alert and oriented to person, place, and time.  Psychiatric:        Mood and Affect: Mood normal.        Behavior: Behavior normal.     LABORATORY DATA:  I have reviewed the labs as listed.     Latest Ref Rng & Units 05/03/2022    8:47 AM 04/28/2022    8:20 AM 04/21/2022   10:38 AM  CBC  WBC 4.0 - 10.5 K/uL 7.9  8.4  9.3   Hemoglobin 13.0 - 17.0 g/dL 6.8  6.5  6.2   Hematocrit 39.0 - 52.0 % 20.9  19.6  19.5   Platelets 150 - 400 K/uL _0 C    C Corrected result       Latest Ref Rng & Units 05/03/2022    8:47 AM 04/28/2022    8:20 AM 04/14/2022    8:48 AM  CMP  Glucose 70 - 99 mg/dL 86  92  111   BUN 8 - 23 mg/dL _1 Creatinine 0.61 - 1.24 mg/dL 1.02  1.13  1.09   Sodium 135 - 145 mmol/L 140  142  141   Potassium 3.5 - 5.1 mmol/L 3.8  3.7  3.4   Chloride 98 - 111 mmol/L 111  111  112   CO2 22 - 32 mmol/L _2 Calcium 8.9 - 10.3 mg/dL 8.3  8.5  8.4   Total Protein 6.5 - 8.1 g/dL 5.6  5.8  5.7   Total Bilirubin 0.3 - 1.2 mg/dL 1.0  0.6   0.7   Alkaline Phos 38 - 126 U/L 51  50  50   AST 15 - 41 U/L _3 ALT 0 - 44 U/L _4 DIAGNOSTIC IMAGING:  I have independently reviewed the scans and discussed with the patient. No results found.   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  Hemoglobin 15.5 on 09/20/2018.  Platelet count on 09/20/1998 2155. - He was on aspirin 81 mg at presentation.  He received 3 units PRBC.  Ferritin was 244 and percent saturation 37.  L46 was 503 and folic acid normal.  Creatinine was normal. - MMA, copper, folic acid and LDH was normal.  SPEP negative. - Serum EPO 140.  Kappa light chains elevated at 32.4 with ratio 1.55. - Bone marrow biopsy on 09/18/2021: Hypercellular marrow with trilineage dyspoiesis and monocytosis.  Dyspoiesis in the granulocytic precursors without apparent late maturation of neutrophils, increased monocytes but no significant blast population.  Increased megakaryocytes with widely separated nuclear lobes.  Erythroid precursors slightly decreased in number with the type  ER.  Ring sideroblasts not identified. - Chromosome analysis and MDS FISH panel was normal. 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 - Based on [facility] molecular model, he has 3 points with circulating immature cells, decreased hemoglobin, decreased platelets.  This puts him in high risk, with overall survival 16 months. - Azacitidine started on 10/19/2021.  Required 2 units PRBC after cycle 2, 3 units PRBC after cycle 3. - BMBX (02/25/2022): Hypercellular marrow with 5% blasts on smear.  Blasts were not seen on flow cytometry.  MDS FISH panel was normal.  Cytogenetics was normal.      -Discussed option of increasing azacitidine dose versus adding Revlimid 10 mg 3 weeks on/1 week off.  We have increased azacitidine to 75 mg x 7 days on  cycle 5 on 03/22/2022.    Social/family history: - Lives at home by himself.  Son lives in [place].  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  No exposure to chemicals.  Non-smoker. - Brother had colon cancer.  Another brother had?  Stomach cancer.  2 maternal uncles had cancer, type unknown to the patient.   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  He did receive intermittent transfusions.  Did not have any major GI side effects. - Complained of fatigue from anemia.  Hemoglobin today 6.8.  We will schedule him for 2 units of PRBC. - Platelet count is still low at 22,000.  I will hold his treatment today.  Will wait until platelet count comes to 40-50 K.  Will recheck CBC on 05/10/2022 for possible treatment. - Recommend RTC in 8 weeks with repeat bone marrow biopsy 1 to 2 weeks prior to next visit.  Will request cytogenetics and MDS FISH panel on bone marrow biopsy.   2.  Weight loss: - Continue boost 1 can twice daily.  Weight is stable.  3.  Leg swelling: - He is taking Lasix 20 mg daily.  He still has 1+ edema with some tenderness at ankles.  Will increase Lasix to 40 mg daily.   Orders placed this encounter:  Orders Placed This Encounter  Procedures   [place], [place] [phone]

## 2022-05-03 NOTE — Progress Notes (Signed)
CRITICAL VALUE ALERT Critical value received:  WBC 22, HGB 6.8 Date of notification:  05-03-22 Time of notification: 0955 Critical value read back:  Yes.   Nurse who received alert:  C. Myasia Sinatra RN MD notified time and response:  [phone], Dr. Delton Coombes, will give 2 units of blood per orders today.

## 2022-05-03 NOTE — Patient Instructions (Signed)
[facility]  Discharge Instructions: Thank you for choosing [facility] to provide your oncology and hematology care.  If you have a lab appointment with the [facility], please come in thru the Main Entrance and check in at the main information desk.  Wear comfortable clothing and clothing appropriate for easy access to any Portacath or PICC line.   We strive to give you quality time with your provider. You may need to reschedule your appointment if you arrive late (15 or more minutes).  Arriving late affects you and other patients whose appointments are after yours.  Also, if you miss three or more appointments without notifying the office, you may be dismissed from the clinic at the provider's discretion.      For prescription refill requests, have your pharmacy contact our office and allow 72 hours for refills to be completed.    Today you received the following chemotherapy and/or immunotherapy agents 2 UPRBC      To help prevent nausea and vomiting after your treatment, we encourage you to take your nausea medication as directed.  BELOW ARE SYMPTOMS THAT SHOULD BE REPORTED IMMEDIATELY: *FEVER GREATER THAN 100.4 F (38 C) OR HIGHER *CHILLS OR SWEATING *NAUSEA AND VOMITING THAT IS NOT CONTROLLED WITH YOUR NAUSEA MEDICATION *UNUSUAL SHORTNESS OF BREATH *UNUSUAL BRUISING OR BLEEDING *URINARY PROBLEMS (pain or burning when urinating, or frequent urination) *BOWEL PROBLEMS (unusual diarrhea, constipation, pain near the anus) TENDERNESS IN MOUTH AND THROAT WITH OR WITHOUT PRESENCE OF ULCERS (sore throat, sores in mouth, or a toothache) UNUSUAL RASH, SWELLING OR PAIN  UNUSUAL VAGINAL DISCHARGE OR ITCHING   Items with * indicate a potential emergency and should be followed up as soon as possible or go to the Emergency Department if any problems should occur.  Please show the CHEMOTHERAPY ALERT CARD or IMMUNOTHERAPY ALERT CARD at check-in to the Emergency  Department and triage nurse.  Should you have questions after your visit or need to cancel or reschedule your appointment, please contact [facility] [phone]  and follow the prompts.  Office hours are 8:00 a.m. to 4:30 p.m. Monday - Friday. Please note that voicemails left after 4:00 p.m. may not be returned until the following business day.  We are closed weekends and major holidays. You have access to a nurse at all times for urgent questions. Please call the main number to the clinic [phone] and follow the prompts.  For any non-urgent questions, you may also contact your provider using MyChart. We now offer e-Visits for anyone 75 and older to request care online for non-urgent symptoms. For details visit [email or web address].   Also download the MyChart app! Go to the app store, search "MyChart", open the app, select [facility], and log in with your MyChart username and password.  Masks are optional in the cancer centers. If you would like for your care team to wear a mask while they are taking care of you, please let them know. You may have one support person who is at least 86 years old accompany you for your appointments.

## 2022-05-03 NOTE — Progress Notes (Signed)
Patients port flushed without difficulty.  Good blood return noted with no bruising or swelling noted at site.  Stable during access and blood draw.  Patient to remain accessed for treatment. 

## 2022-05-03 NOTE — Progress Notes (Signed)
Patient presents today for Vidaza infusion per providers order.  Vital signs within parameters for treatment.  Labs reviewed and ANC is 1.4, Hgb 6.8 and platelets 22, MD aware.  Patient to receive 2 UPRBC with treatment.  Treatment being held today and MD will reassess next week and treat if platelets are above 45.  Stable during transfusion without adverse affects.  Vital signs stable.  No complaints at this time.  Discharge from clinic ambulatory in stable condition.  Alert and oriented X 3.  Follow up with [facility] as scheduled.

## 2022-05-04 ENCOUNTER — Inpatient Hospital Stay: Payer: Medicare HMO

## 2022-05-04 LAB — BPAM RBC
Blood Product Expiration Date: 202310192359
Blood Product Expiration Date: 202310252359
ISSUE DATE / TIME: 202309251050
ISSUE DATE / TIME: 202309251229
Unit Type and Rh: 1700
Unit Type and Rh: 1700

## 2022-05-04 LAB — TYPE AND SCREEN
ABO/RH(D): B POS
Antibody Screen: NEGATIVE
Unit division: 0
Unit division: 0

## 2022-05-05 ENCOUNTER — Inpatient Hospital Stay: Payer: Medicare HMO

## 2022-05-05 ENCOUNTER — Other Ambulatory Visit: Payer: Self-pay

## 2022-05-05 DIAGNOSIS — D469 Myelodysplastic syndrome, unspecified: Secondary | ICD-10-CM

## 2022-05-06 ENCOUNTER — Inpatient Hospital Stay: Payer: Medicare HMO

## 2022-05-07 ENCOUNTER — Inpatient Hospital Stay: Payer: Medicare HMO

## 2022-05-10 ENCOUNTER — Inpatient Hospital Stay: Payer: Medicare HMO | Attending: Hematology

## 2022-05-10 ENCOUNTER — Inpatient Hospital Stay: Payer: Medicare HMO | Admitting: Dietician

## 2022-05-10 ENCOUNTER — Inpatient Hospital Stay: Payer: Medicare HMO

## 2022-05-10 DIAGNOSIS — C9310 Chronic myelomonocytic leukemia not having achieved remission: Secondary | ICD-10-CM | POA: Insufficient documentation

## 2022-05-10 DIAGNOSIS — Z79899 Other long term (current) drug therapy: Secondary | ICD-10-CM | POA: Diagnosis not present

## 2022-05-10 DIAGNOSIS — Z95828 Presence of other vascular implants and grafts: Secondary | ICD-10-CM

## 2022-05-10 DIAGNOSIS — D469 Myelodysplastic syndrome, unspecified: Secondary | ICD-10-CM

## 2022-05-10 LAB — CBC WITH DIFFERENTIAL/PLATELET
Abs Immature Granulocytes: 0.68 10*3/uL — ABNORMAL HIGH (ref 0.00–0.07)
Basophils Absolute: 0.1 10*3/uL (ref 0.0–0.1)
Basophils Relative: 1 %
Eosinophils Absolute: 0.1 10*3/uL (ref 0.0–0.5)
Eosinophils Relative: 1 %
HCT: 25.2 % — ABNORMAL LOW (ref 39.0–52.0)
Hemoglobin: 8.4 g/dL — ABNORMAL LOW (ref 13.0–17.0)
Immature Granulocytes: 9 %
Lymphocytes Relative: 32 %
Lymphs Abs: 2.5 10*3/uL (ref 0.7–4.0)
MCH: 30.7 pg (ref 26.0–34.0)
MCHC: 33.3 g/dL (ref 30.0–36.0)
MCV: 92 fL (ref 80.0–100.0)
Monocytes Absolute: 3.1 10*3/uL — ABNORMAL HIGH (ref 0.1–1.0)
Monocytes Relative: 39 %
Neutro Abs: 1.4 10*3/uL — ABNORMAL LOW (ref 1.7–7.7)
Neutrophils Relative %: 18 %
Platelets: 20 10*3/uL — CL (ref 150–400)
RBC: 2.74 MIL/uL — ABNORMAL LOW (ref 4.22–5.81)
RDW: 16.2 % — ABNORMAL HIGH (ref 11.5–15.5)
WBC: 7.7 10*3/uL (ref 4.0–10.5)
nRBC: 1 % — ABNORMAL HIGH (ref 0.0–0.2)

## 2022-05-10 LAB — MAGNESIUM: Magnesium: 2 mg/dL (ref 1.7–2.4)

## 2022-05-10 LAB — COMPREHENSIVE METABOLIC PANEL
ALT: 12 U/L (ref 0–44)
AST: 16 U/L (ref 15–41)
Albumin: 3.1 g/dL — ABNORMAL LOW (ref 3.5–5.0)
Alkaline Phosphatase: 52 U/L (ref 38–126)
Anion gap: 6 (ref 5–15)
BUN: 23 mg/dL (ref 8–23)
CO2: 25 mmol/L (ref 22–32)
Calcium: 8.7 mg/dL — ABNORMAL LOW (ref 8.9–10.3)
Chloride: 108 mmol/L (ref 98–111)
Creatinine, Ser: 1.09 mg/dL (ref 0.61–1.24)
GFR, Estimated: >60 mL/min (ref >60)
Glucose, Bld: 121 mg/dL — ABNORMAL HIGH (ref 70–99)
Potassium: 3.6 mmol/L (ref 3.5–5.1)
Sodium: 139 mmol/L (ref 135–145)
Total Bilirubin: 0.8 mg/dL (ref 0.3–1.2)
Total Protein: 6 g/dL — ABNORMAL LOW (ref 6.5–8.1)

## 2022-05-10 LAB — SAMPLE TO BLOOD BANK

## 2022-05-10 MED ORDER — SODIUM CHLORIDE 0.9% FLUSH
10.0000 mL | Freq: Once | INTRAVENOUS | Status: AC
  Administered 2022-05-10: 10 mL via INTRAVENOUS

## 2022-05-10 MED ORDER — HEPARIN SOD (PORK) LOCK FLUSH 100 UNIT/ML IV SOLN
500.0000 [IU] | Freq: Once | INTRAVENOUS | Status: AC
  Administered 2022-05-10: 500 [IU] via INTRAVENOUS

## 2022-05-10 NOTE — Progress Notes (Signed)
CRITICAL VALUE ALERT Critical value received:  Platelets 20. Date of notification:  05-10-2022 Time of notification: 10:02 am.  Critical value read back:  Yes.   Nurse who received alert:  B. Rashmi Tallent RN.  MD notified time and response:  Dr. Delton Coombes @ 10:03 am. No new orders received.

## 2022-05-10 NOTE — Patient Instructions (Signed)
[facility]  Discharge Instructions: Thank you for choosing [facility] to provide your oncology and hematology care.  If you have a lab appointment with the [facility], please come in thru the Main Entrance and check in at the main information desk.  Wear comfortable clothing and clothing appropriate for easy access to any Portacath or PICC line.   We strive to give you quality time with your provider. You may need to reschedule your appointment if you arrive late (15 or more minutes).  Arriving late affects you and other patients whose appointments are after yours.  Also, if you miss three or more appointments without notifying the office, you may be dismissed from the clinic at the provider's discretion.      For prescription refill requests, have your pharmacy contact our office and allow 72 hours for refills to be completed.    Today you received the following port flush and lab drawn, no treatment given today. Return as scheduled.   To help prevent nausea and vomiting after your treatment, we encourage you to take your nausea medication as directed.  BELOW ARE SYMPTOMS THAT SHOULD BE REPORTED IMMEDIATELY: *FEVER GREATER THAN 100.4 F (38 C) OR HIGHER *CHILLS OR SWEATING *NAUSEA AND VOMITING THAT IS NOT CONTROLLED WITH YOUR NAUSEA MEDICATION *UNUSUAL SHORTNESS OF BREATH *UNUSUAL BRUISING OR BLEEDING *URINARY PROBLEMS (pain or burning when urinating, or frequent urination) *BOWEL PROBLEMS (unusual diarrhea, constipation, pain near the anus) TENDERNESS IN MOUTH AND THROAT WITH OR WITHOUT PRESENCE OF ULCERS (sore throat, sores in mouth, or a toothache) UNUSUAL RASH, SWELLING OR PAIN  UNUSUAL VAGINAL DISCHARGE OR ITCHING   Items with * indicate a potential emergency and should be followed up as soon as possible or go to the Emergency Department if any problems should occur.  Please show the CHEMOTHERAPY ALERT CARD or IMMUNOTHERAPY ALERT CARD at  check-in to the Emergency Department and triage nurse.  Should you have questions after your visit or need to cancel or reschedule your appointment, please contact [facility] [phone]  and follow the prompts.  Office hours are 8:00 a.m. to 4:30 p.m. Monday - Friday. Please note that voicemails left after 4:00 p.m. may not be returned until the following business day.  We are closed weekends and major holidays. You have access to a nurse at all times for urgent questions. Please call the main number to the clinic [phone] and follow the prompts.  For any non-urgent questions, you may also contact your provider using MyChart. We now offer e-Visits for anyone 88 and older to request care online for non-urgent symptoms. For details visit [email or web address].   Also download the MyChart app! Go to the app store, search "MyChart", open the app, select [facility], and log in with your MyChart username and password.  Masks are optional in the cancer centers. If you would like for your care team to wear a mask while they are taking care of you, please let them know. You may have one support person who is at least 86 years old accompany you for your appointments.

## 2022-05-10 NOTE — Progress Notes (Signed)
Patient presents today for possible treatment. Platelets 20, per Dr. Delton Coombes no treatment today, have patient return next week for labs and possible treatment if platelets are over 40. Patient made aware and given print out of new scheduled. Port flushed with good blood return noted. No bruising or swelling at site. Bandaid applied and patient discharged in satisfactory condition. VVS stable with no signs or symptoms of distressed noted.

## 2022-05-10 NOTE — Progress Notes (Signed)
Nutrition Follow-up:  Patient with chronic myelomonocytic leukemia. He is receiving azacitidine q28d  Patient arrived early for appointments. Nutrition follow-up completed in waiting area. Patient reports apple juice does not taste good. He did try drinking this in the morning after brushing his teeth. Patient reports taste overall has improved. He says foods will begin tasting bad again once he starts next cycle. Patient recalls meats and eggs specifically that taste "like they have gone bad." Hot sauce has helped "cover up the taste" in the past. He has not been doing baking soda salt water rinses lately. Patient agreeable to resume rinses several times daily. Patient often forgets to take appetite stimulant. Says he takes Megace a few times/week. He is drinking Boost VHC. Most of the time patient drinks one, but some days he will drink two. Yesterday patient ate 2 eggs for breakfast, drank 2 Boost VHC, 3 wings for dinner. Patient states he drinks too much water. If cold, he drinks the bottle in a few sips. Patient reports bilateral lower extremity swelling has significantly improved since increasing lasix to 40 mg/day. Ankle soreness has resolved. Patient states he is getting around much better. Patient denies nausea, vomiting, diarrhea, constipation.    Medications: lasix 40 mg/day (9/25)  Labs: glucose 121  Anthropometrics: Last wt 163 lb on 9/25 increased from 153 lb 6.4 oz on 8/15 (fluid)  No updated wt at this time for review.   NUTRITION DIAGNOSIS: Moderate malnutrition continues, but stable   INTERVENTION:  Reviewed strategies for altered taste, pt will start baking soda salt water rinses several times daily and before meals Suggested sauces/gravy on meats for altered taste + ease of intake Continue drinking Boost VHC (530 kcal, 22 grams protein) - recommend 2x/day  Take appetite stimulant as prescribed per MD     MONITORING, EVALUATION, GOAL: weight trends, intake    NEXT  VISIT: Thursday November 16 during infusion

## 2022-05-11 ENCOUNTER — Inpatient Hospital Stay: Payer: Medicare HMO

## 2022-05-12 ENCOUNTER — Inpatient Hospital Stay: Payer: Medicare HMO

## 2022-05-13 ENCOUNTER — Inpatient Hospital Stay: Payer: Medicare HMO

## 2022-05-14 ENCOUNTER — Inpatient Hospital Stay: Payer: Medicare HMO

## 2022-05-17 ENCOUNTER — Inpatient Hospital Stay: Payer: Medicare HMO

## 2022-05-17 VITALS — BP 130/67 | HR 72 | Temp 97.2°F | Resp 18 | Wt 157.6 lb

## 2022-05-17 DIAGNOSIS — C9310 Chronic myelomonocytic leukemia not having achieved remission: Secondary | ICD-10-CM | POA: Diagnosis not present

## 2022-05-17 DIAGNOSIS — Z95828 Presence of other vascular implants and grafts: Secondary | ICD-10-CM

## 2022-05-17 DIAGNOSIS — D469 Myelodysplastic syndrome, unspecified: Secondary | ICD-10-CM

## 2022-05-17 LAB — CBC WITH DIFFERENTIAL/PLATELET
Abs Immature Granulocytes: 0.4 10*3/uL — ABNORMAL HIGH (ref 0.00–0.07)
Band Neutrophils: 2 %
Basophils Absolute: 0 10*3/uL (ref 0.0–0.1)
Basophils Relative: 0 %
Eosinophils Absolute: 0 10*3/uL (ref 0.0–0.5)
Eosinophils Relative: 0 %
HCT: 22.3 % — ABNORMAL LOW (ref 39.0–52.0)
Hemoglobin: 7.2 g/dL — ABNORMAL LOW (ref 13.0–17.0)
Lymphocytes Relative: 45 %
Lymphs Abs: 6 10*3/uL — ABNORMAL HIGH (ref 0.7–4.0)
MCH: 29.9 pg (ref 26.0–34.0)
MCHC: 32.3 g/dL (ref 30.0–36.0)
MCV: 92.5 fL (ref 80.0–100.0)
Metamyelocytes Relative: 1 %
Monocytes Absolute: 3.9 10*3/uL — ABNORMAL HIGH (ref 0.1–1.0)
Monocytes Relative: 29 %
Myelocytes: 2 %
Neutro Abs: 3.1 10*3/uL (ref 1.7–7.7)
Neutrophils Relative %: 21 %
Platelets: 21 10*3/uL — CL (ref 150–400)
RBC: 2.41 MIL/uL — ABNORMAL LOW (ref 4.22–5.81)
RDW: 16 % — ABNORMAL HIGH (ref 11.5–15.5)
WBC: 13.4 10*3/uL — ABNORMAL HIGH (ref 4.0–10.5)
nRBC: 0.9 % — ABNORMAL HIGH (ref 0.0–0.2)

## 2022-05-17 LAB — SAMPLE TO BLOOD BANK

## 2022-05-17 LAB — COMPREHENSIVE METABOLIC PANEL
ALT: 13 U/L (ref 0–44)
AST: 19 U/L (ref 15–41)
Albumin: 3.1 g/dL — ABNORMAL LOW (ref 3.5–5.0)
Alkaline Phosphatase: 59 U/L (ref 38–126)
Anion gap: 8 (ref 5–15)
BUN: 26 mg/dL — ABNORMAL HIGH (ref 8–23)
CO2: 23 mmol/L (ref 22–32)
Calcium: 8.5 mg/dL — ABNORMAL LOW (ref 8.9–10.3)
Chloride: 109 mmol/L (ref 98–111)
Creatinine, Ser: 1.2 mg/dL (ref 0.61–1.24)
GFR, Estimated: 58 mL/min — ABNORMAL LOW (ref >60)
Glucose, Bld: 149 mg/dL — ABNORMAL HIGH (ref 70–99)
Potassium: 3.5 mmol/L (ref 3.5–5.1)
Sodium: 140 mmol/L (ref 135–145)
Total Bilirubin: 0.7 mg/dL (ref 0.3–1.2)
Total Protein: 6.3 g/dL — ABNORMAL LOW (ref 6.5–8.1)

## 2022-05-17 LAB — MAGNESIUM: Magnesium: 2 mg/dL (ref 1.7–2.4)

## 2022-05-17 LAB — PREPARE RBC (CROSSMATCH)

## 2022-05-17 MED ORDER — SODIUM CHLORIDE 0.9% FLUSH
10.0000 mL | INTRAVENOUS | Status: AC | PRN
  Administered 2022-05-17: 10 mL

## 2022-05-17 MED ORDER — HEPARIN SOD (PORK) LOCK FLUSH 100 UNIT/ML IV SOLN
500.0000 [IU] | Freq: Every day | INTRAVENOUS | Status: AC | PRN
  Administered 2022-05-17: 500 [IU]

## 2022-05-17 MED ORDER — DIPHENHYDRAMINE HCL 25 MG PO CAPS
25.0000 mg | ORAL_CAPSULE | Freq: Once | ORAL | Status: AC
  Administered 2022-05-17: 25 mg via ORAL
  Filled 2022-05-17: qty 1

## 2022-05-17 MED ORDER — ACETAMINOPHEN 325 MG PO TABS
650.0000 mg | ORAL_TABLET | Freq: Once | ORAL | Status: AC
  Administered 2022-05-17: 650 mg via ORAL
  Filled 2022-05-17: qty 2

## 2022-05-17 MED ORDER — SODIUM CHLORIDE 0.9% IV SOLUTION
250.0000 mL | Freq: Once | INTRAVENOUS | Status: AC
  Administered 2022-05-17: 250 mL via INTRAVENOUS

## 2022-05-17 NOTE — Progress Notes (Signed)
Pt presents today for Vidaza per provider's order. Vital signs stable and pt voiced no new complaints at this time. Pt's hemoglobin noted to be 7.2 and platelets 21 today. Message sent to Dr.K and he stated no treatment today due to low platelet count. Pt will receive 1 unit of blood today per Dr.K.  Discharged from clinic ambulatory in stable condition. Alert and oriented x 3. F/U with [facility] as scheduled.

## 2022-05-17 NOTE — Patient Instructions (Signed)
[facility] [place]  Discharge Instructions: Thank you for choosing [facility] to provide your oncology and hematology care.  If you have a lab appointment with the Cancer Center, please come in thru the Main Entrance and check in at the main information desk.  Wear comfortable clothing and clothing appropriate for easy access to any Portacath or PICC line.   We strive to give you quality time with your provider. You may need to reschedule your appointment if you arrive late (15 or more minutes).  Arriving late affects you and other patients whose appointments are after yours.  Also, if you miss three or more appointments without notifying the office, you may be dismissed from the clinic at the provider's discretion.      For prescription refill requests, have your pharmacy contact our office and allow 72 hours for refills to be completed.    Today you received 1 unit of blood     BELOW ARE SYMPTOMS THAT SHOULD BE REPORTED IMMEDIATELY: *FEVER GREATER THAN 100.4 F (38 C) OR HIGHER *CHILLS OR SWEATING *NAUSEA AND VOMITING THAT IS NOT CONTROLLED WITH YOUR NAUSEA MEDICATION *UNUSUAL SHORTNESS OF BREATH *UNUSUAL BRUISING OR BLEEDING *URINARY PROBLEMS (pain or burning when urinating, or frequent urination) *BOWEL PROBLEMS (unusual diarrhea, constipation, pain near the anus) TENDERNESS IN MOUTH AND THROAT WITH OR WITHOUT PRESENCE OF ULCERS (sore throat, sores in mouth, or a toothache) UNUSUAL RASH, SWELLING OR PAIN  UNUSUAL VAGINAL DISCHARGE OR ITCHING   Items with * indicate a potential emergency and should be followed up as soon as possible or go to the Emergency Department if any problems should occur.  Please show the CHEMOTHERAPY ALERT CARD or IMMUNOTHERAPY ALERT CARD at check-in to the Emergency Department and triage nurse.  Should you have questions after your visit or need to cancel or reschedule your appointment, please contact [facility]  [place] [phone]  and follow the prompts.  Office hours are 8:00 a.m. to 4:30 p.m. Monday - Friday. Please note that voicemails left after 4:00 p.m. may not be returned until the following business day.  We are closed weekends and major holidays. You have access to a nurse at all times for urgent questions. Please call the main number to the clinic [phone] and follow the prompts.  For any non-urgent questions, you may also contact your provider using MyChart. We now offer e-Visits for anyone 18 and older to request care online for non-urgent symptoms. For details visit [email or web address].   Also download the MyChart app! Go to the app store, search "MyChart", open the app, select [facility], and log in with your MyChart username and password.  Masks are optional in the cancer centers. If you would like for your care team to wear a mask while they are taking care of you, please let them know. You may have one support person who is at least 86 years old accompany you for your appointments.  

## 2022-05-18 ENCOUNTER — Inpatient Hospital Stay: Payer: Medicare HMO

## 2022-05-18 LAB — BPAM RBC
Blood Product Expiration Date: 202311022359
ISSUE DATE / TIME: 202310091046
Unit Type and Rh: 1700

## 2022-05-18 LAB — TYPE AND SCREEN
ABO/RH(D): B POS
Antibody Screen: NEGATIVE
Unit division: 0

## 2022-05-19 ENCOUNTER — Inpatient Hospital Stay: Payer: Medicare HMO

## 2022-05-20 ENCOUNTER — Inpatient Hospital Stay: Payer: Medicare HMO

## 2022-05-21 ENCOUNTER — Inpatient Hospital Stay: Payer: Medicare HMO

## 2022-05-24 ENCOUNTER — Inpatient Hospital Stay: Payer: Medicare HMO

## 2022-05-24 VITALS — BP 155/68 | HR 65 | Temp 97.4°F | Resp 17

## 2022-05-24 DIAGNOSIS — D469 Myelodysplastic syndrome, unspecified: Secondary | ICD-10-CM

## 2022-05-24 DIAGNOSIS — C9310 Chronic myelomonocytic leukemia not having achieved remission: Secondary | ICD-10-CM

## 2022-05-24 DIAGNOSIS — Z95828 Presence of other vascular implants and grafts: Secondary | ICD-10-CM

## 2022-05-24 LAB — CBC WITH DIFFERENTIAL/PLATELET
Abs Immature Granulocytes: 1 10*3/uL — ABNORMAL HIGH (ref 0.00–0.07)
Basophils Absolute: 0 10*3/uL (ref 0.0–0.1)
Basophils Relative: 0 %
Blasts: 2 %
Eosinophils Absolute: 0 10*3/uL (ref 0.0–0.5)
Eosinophils Relative: 0 %
HCT: 23.5 % — ABNORMAL LOW (ref 39.0–52.0)
Hemoglobin: 7.8 g/dL — ABNORMAL LOW (ref 13.0–17.0)
Lymphocytes Relative: 39 %
Lymphs Abs: 4.8 10*3/uL — ABNORMAL HIGH (ref 0.7–4.0)
MCH: 30.6 pg (ref 26.0–34.0)
MCHC: 33.2 g/dL (ref 30.0–36.0)
MCV: 92.2 fL (ref 80.0–100.0)
Metamyelocytes Relative: 5 %
Monocytes Absolute: 4.1 10*3/uL — ABNORMAL HIGH (ref 0.1–1.0)
Monocytes Relative: 33 %
Myelocytes: 3 %
Neutro Abs: 2.2 10*3/uL (ref 1.7–7.7)
Neutrophils Relative %: 18 %
Platelets: 20 10*3/uL — CL (ref 150–400)
RBC: 2.55 MIL/uL — ABNORMAL LOW (ref 4.22–5.81)
RDW: 15.9 % — ABNORMAL HIGH (ref 11.5–15.5)
WBC: 12.3 10*3/uL — ABNORMAL HIGH (ref 4.0–10.5)
nRBC: 0.2 % (ref 0.0–0.2)

## 2022-05-24 LAB — COMPREHENSIVE METABOLIC PANEL
ALT: 11 U/L (ref 0–44)
AST: 13 U/L — ABNORMAL LOW (ref 15–41)
Albumin: 3 g/dL — ABNORMAL LOW (ref 3.5–5.0)
Alkaline Phosphatase: 61 U/L (ref 38–126)
Anion gap: 7 (ref 5–15)
BUN: 20 mg/dL (ref 8–23)
CO2: 20 mmol/L — ABNORMAL LOW (ref 22–32)
Calcium: 8.8 mg/dL — ABNORMAL LOW (ref 8.9–10.3)
Chloride: 113 mmol/L — ABNORMAL HIGH (ref 98–111)
Creatinine, Ser: 1.26 mg/dL — ABNORMAL HIGH (ref 0.61–1.24)
GFR, Estimated: 55 mL/min — ABNORMAL LOW (ref >60)
Glucose, Bld: 137 mg/dL — ABNORMAL HIGH (ref 70–99)
Potassium: 3.6 mmol/L (ref 3.5–5.1)
Sodium: 140 mmol/L (ref 135–145)
Total Bilirubin: 0.4 mg/dL (ref 0.3–1.2)
Total Protein: 6.5 g/dL (ref 6.5–8.1)

## 2022-05-24 LAB — PREPARE RBC (CROSSMATCH)

## 2022-05-24 LAB — MAGNESIUM: Magnesium: 2 mg/dL (ref 1.7–2.4)

## 2022-05-24 MED ORDER — SODIUM CHLORIDE 0.9% IV SOLUTION
250.0000 mL | Freq: Once | INTRAVENOUS | Status: AC
  Administered 2022-05-24: 250 mL via INTRAVENOUS

## 2022-05-24 MED ORDER — SODIUM CHLORIDE 0.9% FLUSH
10.0000 mL | INTRAVENOUS | Status: AC | PRN
  Administered 2022-05-24: 10 mL

## 2022-05-24 MED ORDER — DIPHENHYDRAMINE HCL 25 MG PO CAPS
25.0000 mg | ORAL_CAPSULE | Freq: Once | ORAL | Status: AC
  Administered 2022-05-24: 25 mg via ORAL
  Filled 2022-05-24: qty 1

## 2022-05-24 MED ORDER — ACETAMINOPHEN 325 MG PO TABS
650.0000 mg | ORAL_TABLET | Freq: Once | ORAL | Status: AC
  Administered 2022-05-24: 650 mg via ORAL
  Filled 2022-05-24: qty 2

## 2022-05-24 MED ORDER — HEPARIN SOD (PORK) LOCK FLUSH 100 UNIT/ML IV SOLN
500.0000 [IU] | Freq: Once | INTRAVENOUS | Status: AC
  Administered 2022-05-24: 500 [IU] via INTRAVENOUS

## 2022-05-24 NOTE — Progress Notes (Signed)
Pt presents today for [place] per provider's order. Vital signs stable. Pt's hemoglobin is 7.8 and platelets are 20 today. No treatment today due to platelets below 40. Pt will receive 1 unit of blood due to hemoglobin below 8 per Dr.K.  1 unit of blood given today per MD orders. Tolerated infusion without adverse affects. Vital signs stable. No complaints at this time. Discharged from clinic ambulatory in stable condition. New schedule given to patient. Alert and oriented x 3. F/U with [facility] as scheduled.

## 2022-05-24 NOTE — Patient Instructions (Signed)
[facility] [place]  Discharge Instructions: Thank you for choosing [facility] to provide your oncology and hematology care.  If you have a lab appointment with the Cancer Center, please come in thru the Main Entrance and check in at the main information desk.  Wear comfortable clothing and clothing appropriate for easy access to any Portacath or PICC line.   We strive to give you quality time with your provider. You may need to reschedule your appointment if you arrive late (15 or more minutes).  Arriving late affects you and other patients whose appointments are after yours.  Also, if you miss three or more appointments without notifying the office, you may be dismissed from the clinic at the provider's discretion.      For prescription refill requests, have your pharmacy contact our office and allow 72 hours for refills to be completed.    Today you received 1 unit of blood     BELOW ARE SYMPTOMS THAT SHOULD BE REPORTED IMMEDIATELY: *FEVER GREATER THAN 100.4 F (38 C) OR HIGHER *CHILLS OR SWEATING *NAUSEA AND VOMITING THAT IS NOT CONTROLLED WITH YOUR NAUSEA MEDICATION *UNUSUAL SHORTNESS OF BREATH *UNUSUAL BRUISING OR BLEEDING *URINARY PROBLEMS (pain or burning when urinating, or frequent urination) *BOWEL PROBLEMS (unusual diarrhea, constipation, pain near the anus) TENDERNESS IN MOUTH AND THROAT WITH OR WITHOUT PRESENCE OF ULCERS (sore throat, sores in mouth, or a toothache) UNUSUAL RASH, SWELLING OR PAIN  UNUSUAL VAGINAL DISCHARGE OR ITCHING   Items with * indicate a potential emergency and should be followed up as soon as possible or go to the Emergency Department if any problems should occur.  Please show the CHEMOTHERAPY ALERT CARD or IMMUNOTHERAPY ALERT CARD at check-in to the Emergency Department and triage nurse.  Should you have questions after your visit or need to cancel or reschedule your appointment, please contact [facility]   [phone]  and follow the prompts.  Office hours are 8:00 a.m. to 4:30 p.m. Monday - Friday. Please note that voicemails left after 4:00 p.m. may not be returned until the following business day.  We are closed weekends and major holidays. You have access to a nurse at all times for urgent questions. Please call the main number to the clinic [phone] and follow the prompts.  For any non-urgent questions, you may also contact your provider using MyChart. We now offer e-Visits for anyone 18 and older to request care online for non-urgent symptoms. For details visit [email or web address].   Also download the MyChart app! Go to the app store, search "MyChart", open the app, select [facility], and log in with your MyChart username and password.  Masks are optional in the cancer centers. If you would like for your care team to wear a mask while they are taking care of you, please let them know. You may have one support person who is at least 86 years old accompany you for your appointments.  

## 2022-05-24 NOTE — Progress Notes (Signed)
CRITICAL VALUE STICKER  CRITICAL VALUE: platelets 20  DATE & TIME NOTIFIED: 05/24/2022 at [place] (representative from lab): Avis Epley   MD NOTIFIED: Derek Jack, MD   TIME OF NOTIFICATION: 1009  RESPONSE: no further action at this time

## 2022-05-25 ENCOUNTER — Inpatient Hospital Stay: Payer: Medicare HMO

## 2022-05-25 LAB — BPAM RBC
Blood Product Expiration Date: 202311062359
ISSUE DATE / TIME: 202310161123
Unit Type and Rh: 1700

## 2022-05-25 LAB — TYPE AND SCREEN
ABO/RH(D): B POS
Antibody Screen: NEGATIVE
Unit division: 0

## 2022-05-26 ENCOUNTER — Ambulatory Visit: Payer: Medicare HMO

## 2022-05-27 ENCOUNTER — Ambulatory Visit: Payer: Medicare HMO

## 2022-05-28 ENCOUNTER — Ambulatory Visit: Payer: Medicare HMO

## 2022-05-31 ENCOUNTER — Inpatient Hospital Stay: Payer: Medicare HMO | Admitting: Dietician

## 2022-05-31 ENCOUNTER — Inpatient Hospital Stay: Payer: Medicare HMO

## 2022-05-31 DIAGNOSIS — C9310 Chronic myelomonocytic leukemia not having achieved remission: Secondary | ICD-10-CM | POA: Diagnosis not present

## 2022-05-31 DIAGNOSIS — Z95828 Presence of other vascular implants and grafts: Secondary | ICD-10-CM

## 2022-05-31 DIAGNOSIS — D469 Myelodysplastic syndrome, unspecified: Secondary | ICD-10-CM

## 2022-05-31 LAB — COMPREHENSIVE METABOLIC PANEL
ALT: 12 U/L (ref 0–44)
AST: 15 U/L (ref 15–41)
Albumin: 3.2 g/dL — ABNORMAL LOW (ref 3.5–5.0)
Alkaline Phosphatase: 59 U/L (ref 38–126)
Anion gap: 8 (ref 5–15)
BUN: 25 mg/dL — ABNORMAL HIGH (ref 8–23)
CO2: 21 mmol/L — ABNORMAL LOW (ref 22–32)
Calcium: 8.8 mg/dL — ABNORMAL LOW (ref 8.9–10.3)
Chloride: 111 mmol/L (ref 98–111)
Creatinine, Ser: 1.31 mg/dL — ABNORMAL HIGH (ref 0.61–1.24)
GFR, Estimated: 52 mL/min — ABNORMAL LOW (ref >60)
Glucose, Bld: 113 mg/dL — ABNORMAL HIGH (ref 70–99)
Potassium: 3.6 mmol/L (ref 3.5–5.1)
Sodium: 140 mmol/L (ref 135–145)
Total Bilirubin: 0.4 mg/dL (ref 0.3–1.2)
Total Protein: 6.7 g/dL (ref 6.5–8.1)

## 2022-05-31 LAB — MAGNESIUM: Magnesium: 2.1 mg/dL (ref 1.7–2.4)

## 2022-05-31 LAB — CBC WITH DIFFERENTIAL/PLATELET
Abs Immature Granulocytes: 0 10*3/uL (ref 0.00–0.07)
Basophils Absolute: 0.2 10*3/uL — ABNORMAL HIGH (ref 0.0–0.1)
Basophils Relative: 1 %
Blasts: 1 %
Eosinophils Absolute: 0 10*3/uL (ref 0.0–0.5)
Eosinophils Relative: 0 %
HCT: 26.3 % — ABNORMAL LOW (ref 39.0–52.0)
Hemoglobin: 8.7 g/dL — ABNORMAL LOW (ref 13.0–17.0)
Lymphocytes Relative: 55 %
Lymphs Abs: 8.5 10*3/uL — ABNORMAL HIGH (ref 0.7–4.0)
MCH: 29.8 pg (ref 26.0–34.0)
MCHC: 33.1 g/dL (ref 30.0–36.0)
MCV: 90.1 fL (ref 80.0–100.0)
Monocytes Absolute: 4.5 10*3/uL — ABNORMAL HIGH (ref 0.1–1.0)
Monocytes Relative: 29 %
Neutro Abs: 2.2 10*3/uL (ref 1.7–7.7)
Neutrophils Relative %: 14 %
Platelets: 22 10*3/uL — CL (ref 150–400)
RBC: 2.92 MIL/uL — ABNORMAL LOW (ref 4.22–5.81)
RDW: 15.8 % — ABNORMAL HIGH (ref 11.5–15.5)
WBC: 15.5 10*3/uL — ABNORMAL HIGH (ref 4.0–10.5)
nRBC: 0.5 % — ABNORMAL HIGH (ref 0.0–0.2)

## 2022-05-31 LAB — SAMPLE TO BLOOD BANK

## 2022-05-31 MED ORDER — SODIUM CHLORIDE 0.9% FLUSH
10.0000 mL | Freq: Once | INTRAVENOUS | Status: AC
  Administered 2022-05-31: 10 mL via INTRAVENOUS

## 2022-05-31 MED ORDER — HEPARIN SOD (PORK) LOCK FLUSH 100 UNIT/ML IV SOLN
500.0000 [IU] | Freq: Once | INTRAVENOUS | Status: AC
  Administered 2022-05-31: 500 [IU] via INTRAVENOUS

## 2022-05-31 NOTE — Progress Notes (Signed)
CRITICAL VALUE ALERT Critical value received:  platelets 22 Date of notification:  05-31-22 Time of notification: 0950 Critical value read back:  Yes.   Nurse who received alert:  C.Zalman Hull RN MD notified time and response:  1000, Dr. Learta Codding , will hold treatment today.

## 2022-05-31 NOTE — Progress Notes (Signed)
Nutrition Follow-up:  Patient with chronic myelomonocytic leukemia. He is receiving azacitidine q28d.  Met with patient in infusion. He continues to have altered taste. Patient is using baking soda salt water rinses before meals. This helps some. Patient has not been drinking Boost VHC recently. This has been going right through him. Patient says he has tried 3 different times. He denies nausea, vomiting, constipation. Patient reports lower extremity swelling has resolved.    Medications: reviewed   Labs: glucose 113, BUN 25, Cr 1.31  Anthropometrics: Wt 155 lb 12.8 oz today decreased 2.5% in one week - this is severe (? Fluid)  10/16 - 159 lb 9.6 oz 10/9 - 157 lb 9.6 oz  9/25  - 163 lb 8/15 - 153 lb 6.4 oz   NUTRITION DIAGNOSIS: Moderate malnutrition continues    INTERVENTION:  Suggested pt try drinking 1/4 Boost VHC at a time and increase as tolerated - recommend 2/day (530 kcal, 23 g protein) Continue baking soda salt water rinses several times daily Continue taking appetite stimulant per MD    MONITORING, EVALUATION, GOAL: weight trends, intake   NEXT VISIT: Thursday November 16 during infusion

## 2022-05-31 NOTE — Progress Notes (Signed)
Patient presents today for possible treatment, patient's hemoglobin 8.7, platelets 22, d/t platelets <40 we will hold treatment. Patient's upcoming schedule printed and reviewed with patient.  Port flushed with good blood return noted. No bruising or swelling at site. Bandaid applied and patient discharged in satisfactory condition. VVS stable with no signs or symptoms of distressed noted.

## 2022-06-01 ENCOUNTER — Inpatient Hospital Stay: Payer: Medicare HMO

## 2022-06-02 ENCOUNTER — Ambulatory Visit: Payer: Medicare HMO

## 2022-06-03 ENCOUNTER — Ambulatory Visit: Payer: Medicare HMO

## 2022-06-04 ENCOUNTER — Ambulatory Visit: Payer: Medicare HMO

## 2022-06-07 ENCOUNTER — Other Ambulatory Visit: Payer: Medicare HMO

## 2022-06-07 ENCOUNTER — Ambulatory Visit: Payer: Medicare HMO | Admitting: Hematology

## 2022-06-07 ENCOUNTER — Ambulatory Visit: Payer: Medicare HMO

## 2022-06-07 ENCOUNTER — Inpatient Hospital Stay: Payer: Medicare HMO

## 2022-06-07 VITALS — BP 135/61 | HR 83 | Temp 98.4°F | Resp 18

## 2022-06-07 DIAGNOSIS — Z95828 Presence of other vascular implants and grafts: Secondary | ICD-10-CM

## 2022-06-07 DIAGNOSIS — C9310 Chronic myelomonocytic leukemia not having achieved remission: Secondary | ICD-10-CM

## 2022-06-07 DIAGNOSIS — D469 Myelodysplastic syndrome, unspecified: Secondary | ICD-10-CM

## 2022-06-07 LAB — COMPREHENSIVE METABOLIC PANEL
ALT: 12 U/L (ref 0–44)
AST: 13 U/L — ABNORMAL LOW (ref 15–41)
Albumin: 2.7 g/dL — ABNORMAL LOW (ref 3.5–5.0)
Alkaline Phosphatase: 57 U/L (ref 38–126)
Anion gap: 10 (ref 5–15)
BUN: 33 mg/dL — ABNORMAL HIGH (ref 8–23)
CO2: 18 mmol/L — ABNORMAL LOW (ref 22–32)
Calcium: 8.7 mg/dL — ABNORMAL LOW (ref 8.9–10.3)
Chloride: 113 mmol/L — ABNORMAL HIGH (ref 98–111)
Creatinine, Ser: 1.59 mg/dL — ABNORMAL HIGH (ref 0.61–1.24)
GFR, Estimated: 41 mL/min — ABNORMAL LOW (ref >60)
Glucose, Bld: 145 mg/dL — ABNORMAL HIGH (ref 70–99)
Potassium: 2.9 mmol/L — ABNORMAL LOW (ref 3.5–5.1)
Sodium: 141 mmol/L (ref 135–145)
Total Bilirubin: 0.5 mg/dL (ref 0.3–1.2)
Total Protein: 6.4 g/dL — ABNORMAL LOW (ref 6.5–8.1)

## 2022-06-07 LAB — CBC WITH DIFFERENTIAL/PLATELET
Abs Immature Granulocytes: 1.9 10*3/uL — ABNORMAL HIGH (ref 0.00–0.07)
Band Neutrophils: 2 %
Basophils Absolute: 0 10*3/uL (ref 0.0–0.1)
Basophils Relative: 0 %
Eosinophils Absolute: 0 10*3/uL (ref 0.0–0.5)
Eosinophils Relative: 0 %
HCT: 20.2 % — ABNORMAL LOW (ref 39.0–52.0)
Hemoglobin: 6.8 g/dL — CL (ref 13.0–17.0)
Lymphocytes Relative: 40 %
Lymphs Abs: 7.4 10*3/uL — ABNORMAL HIGH (ref 0.7–4.0)
MCH: 30 pg (ref 26.0–34.0)
MCHC: 33.7 g/dL (ref 30.0–36.0)
MCV: 89 fL (ref 80.0–100.0)
Metamyelocytes Relative: 4 %
Monocytes Absolute: 4.6 10*3/uL — ABNORMAL HIGH (ref 0.1–1.0)
Monocytes Relative: 25 %
Myelocytes: 3 %
Neutro Abs: 4.6 10*3/uL (ref 1.7–7.7)
Neutrophils Relative %: 23 %
Platelets: 16 10*3/uL — CL (ref 150–400)
Promyelocytes Relative: 3 %
RBC: 2.27 MIL/uL — ABNORMAL LOW (ref 4.22–5.81)
RDW: 15.8 % — ABNORMAL HIGH (ref 11.5–15.5)
WBC: 18.5 10*3/uL — ABNORMAL HIGH (ref 4.0–10.5)
nRBC: 0.4 % — ABNORMAL HIGH (ref 0.0–0.2)

## 2022-06-07 LAB — MAGNESIUM: Magnesium: 2 mg/dL (ref 1.7–2.4)

## 2022-06-07 LAB — SAMPLE TO BLOOD BANK

## 2022-06-07 LAB — PREPARE RBC (CROSSMATCH)

## 2022-06-07 MED ORDER — DIPHENHYDRAMINE HCL 25 MG PO CAPS
25.0000 mg | ORAL_CAPSULE | Freq: Once | ORAL | Status: AC
  Administered 2022-06-07: 25 mg via ORAL
  Filled 2022-06-07: qty 1

## 2022-06-07 MED ORDER — SODIUM CHLORIDE 0.9% FLUSH
10.0000 mL | INTRAVENOUS | Status: AC | PRN
  Administered 2022-06-07: 10 mL

## 2022-06-07 MED ORDER — SODIUM CHLORIDE 0.9% IV SOLUTION
250.0000 mL | Freq: Once | INTRAVENOUS | Status: AC
  Administered 2022-06-07: 250 mL via INTRAVENOUS

## 2022-06-07 MED ORDER — POTASSIUM CHLORIDE CRYS ER 20 MEQ PO TBCR
40.0000 meq | EXTENDED_RELEASE_TABLET | Freq: Once | ORAL | Status: AC
  Administered 2022-06-07: 40 meq via ORAL
  Filled 2022-06-07: qty 2

## 2022-06-07 MED ORDER — ACETAMINOPHEN 325 MG PO TABS
650.0000 mg | ORAL_TABLET | Freq: Once | ORAL | Status: AC
  Administered 2022-06-07: 650 mg via ORAL
  Filled 2022-06-07: qty 2

## 2022-06-07 MED ORDER — HEPARIN SOD (PORK) LOCK FLUSH 100 UNIT/ML IV SOLN
500.0000 [IU] | Freq: Every day | INTRAVENOUS | Status: AC | PRN
  Administered 2022-06-07: 500 [IU]

## 2022-06-07 NOTE — Progress Notes (Signed)
CRITICAL VALUE ALERT Critical value received:  HGB 6.8. Platelets 16.  Date of notification:  06-07-2022 Time of notification: 10:27 am.  Critical value read back:  Yes.   Nurse who received alert:  B. Kinzie Wickes RN.  MD notified time and response:  Dr. Delton Coombes / A. Anderson RN @ 10:34 am. Dr. Delton Coombes standing orders followed. Patient will receive 1 unit of blood and 1 unit of platelets today.

## 2022-06-07 NOTE — Progress Notes (Signed)
Labs reviewed. No treatment today due to low platelet count still per MD. Will give one unit of blood and platelets per MD orders. Patient is aware  Blood and platelets  given per orders. Patient tolerated it well without problems. Vitals stable and discharged home from clinic ambulatory. Follow up as scheduled.

## 2022-06-07 NOTE — Patient Instructions (Signed)
[facility]  Discharge Instructions: Thank you for choosing [facility] to provide your oncology and hematology care.  If you have a lab appointment with the [facility], please come in thru the Main Entrance and check in at the main information desk.  Wear comfortable clothing and clothing appropriate for easy access to any Portacath or PICC line.   We strive to give you quality time with your provider. You may need to reschedule your appointment if you arrive late (15 or more minutes).  Arriving late affects you and other patients whose appointments are after yours.  Also, if you miss three or more appointments without notifying the office, you may be dismissed from the clinic at the provider's discretion.      For prescription refill requests, have your pharmacy contact our office and allow 72 hours for refills to be completed.    Today you received one unit of blood and one unit of platelets   To help prevent nausea and vomiting after your treatment, we encourage you to take your nausea medication as directed.  BELOW ARE SYMPTOMS THAT SHOULD BE REPORTED IMMEDIATELY: *FEVER GREATER THAN 100.4 F (38 C) OR HIGHER *CHILLS OR SWEATING *NAUSEA AND VOMITING THAT IS NOT CONTROLLED WITH YOUR NAUSEA MEDICATION *UNUSUAL SHORTNESS OF BREATH *UNUSUAL BRUISING OR BLEEDING *URINARY PROBLEMS (pain or burning when urinating, or frequent urination) *BOWEL PROBLEMS (unusual diarrhea, constipation, pain near the anus) TENDERNESS IN MOUTH AND THROAT WITH OR WITHOUT PRESENCE OF ULCERS (sore throat, sores in mouth, or a toothache) UNUSUAL RASH, SWELLING OR PAIN  UNUSUAL VAGINAL DISCHARGE OR ITCHING   Items with * indicate a potential emergency and should be followed up as soon as possible or go to the Emergency Department if any problems should occur.  Please show the CHEMOTHERAPY ALERT CARD or IMMUNOTHERAPY ALERT CARD at check-in to the Emergency Department and triage  nurse.  Should you have questions after your visit or need to cancel or reschedule your appointment, please contact [facility] [phone]  and follow the prompts.  Office hours are 8:00 a.m. to 4:30 p.m. Monday - Friday. Please note that voicemails left after 4:00 p.m. may not be returned until the following business day.  We are closed weekends and major holidays. You have access to a nurse at all times for urgent questions. Please call the main number to the clinic [phone] and follow the prompts.  For any non-urgent questions, you may also contact your provider using MyChart. We now offer e-Visits for anyone 21 and older to request care online for non-urgent symptoms. For details visit [email or web address].   Also download the MyChart app! Go to the app store, search "MyChart", open the app, select [facility], and log in with your MyChart username and password.  Masks are optional in the cancer centers. If you would like for your care team to wear a mask while they are taking care of you, please let them know. You may have one support person who is at least 86 years old accompany you for your appointments.

## 2022-06-08 ENCOUNTER — Ambulatory Visit: Payer: Medicare HMO

## 2022-06-08 LAB — TYPE AND SCREEN
ABO/RH(D): B POS
Antibody Screen: NEGATIVE
Unit division: 0

## 2022-06-08 LAB — PREPARE PLATELET PHERESIS: Unit division: 0

## 2022-06-08 LAB — BPAM RBC
Blood Product Expiration Date: 202311192359
ISSUE DATE / TIME: 202310301309
Unit Type and Rh: 1700

## 2022-06-08 LAB — BPAM PLATELET PHERESIS
Blood Product Expiration Date: 202311012359
ISSUE DATE / TIME: 202310301159
Unit Type and Rh: 5100

## 2022-06-09 ENCOUNTER — Ambulatory Visit: Payer: Medicare HMO

## 2022-06-10 ENCOUNTER — Ambulatory Visit: Payer: Medicare HMO

## 2022-06-11 ENCOUNTER — Ambulatory Visit: Payer: Medicare HMO

## 2022-06-14 ENCOUNTER — Ambulatory Visit (HOSPITAL_COMMUNITY): Payer: Medicare HMO

## 2022-06-14 ENCOUNTER — Other Ambulatory Visit: Payer: Medicare HMO

## 2022-06-14 ENCOUNTER — Ambulatory Visit: Payer: Medicare HMO

## 2022-06-14 ENCOUNTER — Inpatient Hospital Stay: Payer: Medicare HMO

## 2022-06-14 ENCOUNTER — Inpatient Hospital Stay: Payer: Medicare HMO | Attending: Hematology

## 2022-06-14 ENCOUNTER — Other Ambulatory Visit: Payer: Self-pay | Admitting: Student

## 2022-06-14 VITALS — BP 135/73 | HR 68 | Temp 98.2°F | Resp 18

## 2022-06-14 DIAGNOSIS — C9310 Chronic myelomonocytic leukemia not having achieved remission: Secondary | ICD-10-CM | POA: Insufficient documentation

## 2022-06-14 DIAGNOSIS — D469 Myelodysplastic syndrome, unspecified: Secondary | ICD-10-CM

## 2022-06-14 DIAGNOSIS — Z95828 Presence of other vascular implants and grafts: Secondary | ICD-10-CM

## 2022-06-14 LAB — CBC WITH DIFFERENTIAL/PLATELET
Abs Immature Granulocytes: 3.1 10*3/uL — ABNORMAL HIGH (ref 0.00–0.07)
Basophils Absolute: 0 10*3/uL (ref 0.0–0.1)
Basophils Relative: 0 %
Blasts: 2 %
Eosinophils Absolute: 0 10*3/uL (ref 0.0–0.5)
Eosinophils Relative: 0 %
HCT: 20.4 % — ABNORMAL LOW (ref 39.0–52.0)
Hemoglobin: 6.8 g/dL — CL (ref 13.0–17.0)
Lymphocytes Relative: 40 %
Lymphs Abs: 13.9 10*3/uL — ABNORMAL HIGH (ref 0.7–4.0)
MCH: 29.4 pg (ref 26.0–34.0)
MCHC: 33.3 g/dL (ref 30.0–36.0)
MCV: 88.3 fL (ref 80.0–100.0)
Metamyelocytes Relative: 4 %
Monocytes Absolute: 12.5 10*3/uL — ABNORMAL HIGH (ref 0.1–1.0)
Monocytes Relative: 36 %
Myelocytes: 3 %
Neutro Abs: 4.5 10*3/uL (ref 1.7–7.7)
Neutrophils Relative %: 13 %
Platelets: 22 10*3/uL — CL (ref 150–400)
Promyelocytes Relative: 2 %
RBC: 2.31 MIL/uL — ABNORMAL LOW (ref 4.22–5.81)
RDW: 16.5 % — ABNORMAL HIGH (ref 11.5–15.5)
WBC: 34.7 10*3/uL — ABNORMAL HIGH (ref 4.0–10.5)
nRBC: 0.1 % (ref 0.0–0.2)

## 2022-06-14 LAB — PREPARE RBC (CROSSMATCH)

## 2022-06-14 LAB — SAMPLE TO BLOOD BANK

## 2022-06-14 MED ORDER — SODIUM CHLORIDE 0.9% FLUSH
10.0000 mL | Freq: Once | INTRAVENOUS | Status: AC
  Administered 2022-06-14: 10 mL via INTRAVENOUS

## 2022-06-14 MED ORDER — DIPHENHYDRAMINE HCL 25 MG PO CAPS
25.0000 mg | ORAL_CAPSULE | Freq: Once | ORAL | Status: AC
  Administered 2022-06-14: 25 mg via ORAL
  Filled 2022-06-14: qty 1

## 2022-06-14 MED ORDER — SODIUM CHLORIDE 0.9% FLUSH
10.0000 mL | INTRAVENOUS | Status: AC | PRN
  Administered 2022-06-14: 10 mL

## 2022-06-14 MED ORDER — SODIUM CHLORIDE 0.9% IV SOLUTION
250.0000 mL | Freq: Once | INTRAVENOUS | Status: AC
  Administered 2022-06-14: 250 mL via INTRAVENOUS

## 2022-06-14 MED ORDER — HEPARIN SOD (PORK) LOCK FLUSH 100 UNIT/ML IV SOLN
500.0000 [IU] | Freq: Every day | INTRAVENOUS | Status: AC | PRN
  Administered 2022-06-14: 500 [IU]

## 2022-06-14 MED ORDER — ACETAMINOPHEN 325 MG PO TABS
650.0000 mg | ORAL_TABLET | Freq: Once | ORAL | Status: AC
  Administered 2022-06-14: 650 mg via ORAL
  Filled 2022-06-14: qty 2

## 2022-06-14 NOTE — Patient Instructions (Signed)
[facility]  Discharge Instructions: Thank you for choosing [facility] to provide your oncology and hematology care.  If you have a lab appointment with the Cancer Center, please come in thru the Main Entrance and check in at the main information desk.  Wear comfortable clothing and clothing appropriate for easy access to any Portacath or PICC line.   We strive to give you quality time with your provider. You may need to reschedule your appointment if you arrive late (15 or more minutes).  Arriving late affects you and other patients whose appointments are after yours.  Also, if you miss three or more appointments without notifying the office, you may be dismissed from the clinic at the provider's discretion.      For prescription refill requests, have your pharmacy contact our office and allow 72 hours for refills to be completed.    Today you received 2 units of blood.    BELOW ARE SYMPTOMS THAT SHOULD BE REPORTED IMMEDIATELY: *FEVER GREATER THAN 100.4 F (38 C) OR HIGHER *CHILLS OR SWEATING *NAUSEA AND VOMITING THAT IS NOT CONTROLLED WITH YOUR NAUSEA MEDICATION *UNUSUAL SHORTNESS OF BREATH *UNUSUAL BRUISING OR BLEEDING *URINARY PROBLEMS (pain or burning when urinating, or frequent urination) *BOWEL PROBLEMS (unusual diarrhea, constipation, pain near the anus) TENDERNESS IN MOUTH AND THROAT WITH OR WITHOUT PRESENCE OF ULCERS (sore throat, sores in mouth, or a toothache) UNUSUAL RASH, SWELLING OR PAIN  UNUSUAL VAGINAL DISCHARGE OR ITCHING   Items with * indicate a potential emergency and should be followed up as soon as possible or go to the Emergency Department if any problems should occur.  Please show the CHEMOTHERAPY ALERT CARD or IMMUNOTHERAPY ALERT CARD at check-in to the Emergency Department and triage nurse.  Should you have questions after your visit or need to cancel or reschedule your appointment, please contact [facility]  [place] [phone]  and follow the prompts.  Office hours are 8:00 a.m. to 4:30 p.m. Monday - Friday. Please note that voicemails left after 4:00 p.m. may not be returned until the following business day.  We are closed weekends and major holidays. You have access to a nurse at all times for urgent questions. Please call the main number to the clinic [phone] and follow the prompts.  For any non-urgent questions, you may also contact your provider using MyChart. We now offer e-Visits for anyone 18 and older to request care online for non-urgent symptoms. For details visit [email or web address].   Also download the MyChart app! Go to the app store, search "MyChart", open the app, select [facility], and log in with your MyChart username and password.  Masks are optional in the cancer centers. If you would like for your care team to wear a mask while they are taking care of you, please let them know. You may have one support person who is at least 86 years old accompany you for your appointments.  

## 2022-06-14 NOTE — Progress Notes (Signed)
CRITICAL VALUE ALERT Critical value received:  hgb 6.8, platelets 22 Date of notification:  06-14-22 Time of notification: 0938 Critical value read back:  Yes.   Nurse who received alert:  C. Julaine Zimny RN MD notified time and response:  0945, Dr. Delton Coombes, give 2 units today per MD.

## 2022-06-14 NOTE — Progress Notes (Signed)
Pt presents today for possible blood per provider's order. Pt's hemoglobin noted to be 6.8 and platelets are 22. Message sent to Dr. Raliegh Ip and he stated to give 2 units of blood.   2 units of blood given today per MD orders. Tolerated infusion without adverse affects. Vital signs stable. No complaints at this time. Discharged from clinic ambulatory in stable condition. Alert and oriented x 3. F/U with [facility] as scheduled.

## 2022-06-14 NOTE — H&P (Signed)
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 male with PMH of CML diagnosed 10/01/21. Patient is currently being followed and treated by oncology. 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 Pt has been receiving chemotherapy. Pt has once again been referred to IR for bone marrow biopsy and aspiration to evaluate response to treatment at the request of Dr. Ellin Saba.   Pt denies fever, chills, CP or N/V.  He endorses DOE and back pain.  He is NPO per order.   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   Hypertension    Port-A-Cath in place 10/15/2021    Past Surgical History:  Procedure Laterality Date   CARPAL TUNNEL RELEASE Right    COLONOSCOPY N/A 09/19/2013   Procedure: COLONOSCOPY;  Surgeon: Malissa Hippo, MD;  Location: [facility] ENDO SUITE;  Service: Endoscopy;  Laterality: N/A;  830   FINGER AMPUTATION Left    4th and 5th   left eye blindness Left    PORTACATH PLACEMENT Right 10/16/2021   Procedure: INSERTION PORT-A-CATH;  Surgeon: Franky Macho, MD;  Location: [facility] ORS;  Service: General;  Laterality: Right;   SVT ABLATION N/A 09/21/2018   Procedure: SVT ABLATION;  Surgeon: Regan Lemming, MD;  Location: [facility] INVASIVE CV LAB;  Service: Cardiovascular;  Laterality: N/A;    Allergies: Aspirin and Flomax [tamsulosin]  Medications: Prior to Admission medications   Medication Sig Start Date End Date Taking? Authorizing Provider  albuterol (VENTOLIN HFA) 108 (90 Base) MCG/ACT inhaler Inhale 2 puffs into the lungs every 6 (six) hours as needed for wheezing or shortness  of breath. 07/28/21   [provider]  alfuzosin (UROXATRAL) 10 MG 24 hr tablet Take 1 tablet (10 mg total) by mouth daily with breakfast. 12/08/21   Marcine Matar, MD  azaCITIDine 5 mg/2 mLs in lactated ringers infusion Inject 75 mg/m2 into the vein daily. Days 1-5 every 28 days 10/19/21   [provider]  cholecalciferol (VITAMIN D) 1000 units tablet Take 1,000 Units by mouth daily.    [provider]  cloNIDine (CATAPRES) 0.1 MG tablet Take 0.1 mg by mouth daily. 05/16/18   [provider]  docusate sodium (COLACE) 100 MG capsule Take 1 capsule (100 mg total) by mouth 2 (two) times daily. 12/01/21   Doreatha Massed, MD  furosemide (LASIX) 20 MG tablet Take 1 tablet (20 mg total) by mouth daily as needed (take as needed every morning for ankle swelling). 03/15/22   Doreatha Massed, MD  hydrochlorothiazide (HYDRODIURIL) 25 MG tablet Take 25 mg by mouth daily. 06/25/16   [provider]  levocetirizine (XYZAL) 5 MG tablet Take 5 mg by mouth daily. 07/28/21   [provider]  lisinopril (PRINIVIL,ZESTRIL) 40 MG tablet Take 40 mg by mouth daily. 06/09/16   [provider]  megestrol (MEGACE) 400 MG/10ML suspension Take 10 mLs (400 mg total) by mouth 2 (two) times daily. 01/25/22   Doreatha Massed, MD  pantoprazole (PROTONIX) 40 MG tablet Take 1 tablet (40 mg total) by mouth daily. 08/09/21   Lonia Blood, MD  sulfamethoxazole-trimethoprim (BACTRIM DS) 800-160 MG tablet  Take 1 tablet by mouth 2 (two) times daily. 02/16/22   Marcine Matar, MD  vitamin B-12 (CYANOCOBALAMIN) 1000 MCG tablet Take 1,000 mcg by mouth daily.    [provider]  prochlorperazine (COMPAZINE) 10 MG tablet TAKE (1) TABLET BY MOUTH EVERY SIX HOURS AS NEEDED. 11/12/21 04/10/22  Doreatha Massed, MD     Family History  Problem Relation Age of Onset   CAD Mother    CAD Father    Cancer Brother     Social History   Socioeconomic History    Marital status: Widowed    Spouse name: Not on file   Number of children: Not on file   Years of education: Not on file   Highest education level: Not on file  Occupational History   Not on file  Tobacco Use   Smoking status: Former    Packs/day: 0.50    Types: Cigarettes    Start date: 08/09/1956    Quit date: 08/10/1979    Years since quitting: 42.8   Smokeless tobacco: Never  Vaping Use   Vaping Use: Never used  Substance and Sexual Activity   Alcohol use: No    Alcohol/week: 0.0 standard drinks of alcohol   Drug use: No   Sexual activity: Not Currently  Other Topics Concern   Not on file  Social History Narrative   Not on file   Social Determinants of Health   Financial Resource Strain: Not on file  Food Insecurity: Not on file  Transportation Needs: Not on file  Physical Activity: Not on file  Stress: Not on file  Social Connections: Not on file     Review of Systems: A 12 point ROS discussed and pertinent positives are indicated in the HPI above.  All other systems are negative.  Review of Systems  Constitutional:  Negative for chills and fever.  Respiratory:  Positive for shortness of breath. Negative for cough.   Cardiovascular:  Negative for chest pain.  Gastrointestinal:  Negative for abdominal pain, nausea and vomiting.  Musculoskeletal:  Positive for back pain.  Neurological:  Negative for headaches.  Hematological:  Does not bruise/bleed easily.    Vital Signs: BP (!) 142/62   Pulse 84   Temp 98.3 F (36.8 C) (Oral)   Resp 18   Ht 5\' 9"  (1.753 m)   Wt 154 lb 5.2 oz (70 kg)   SpO2 98%   BMI 22.79 kg/m     Physical Exam Constitutional:      General: He is not in acute distress.    Appearance: Normal appearance. He is not ill-appearing.  Cardiovascular:     Rate and Rhythm: Normal rate and regular rhythm.     Pulses: Normal pulses.     Heart sounds: Normal heart sounds.  Pulmonary:     Effort: Pulmonary effort is normal. No respiratory  distress.     Breath sounds: Normal breath sounds.  Abdominal:     General: Bowel sounds are normal. There is no distension.     Palpations: Abdomen is soft.     Tenderness: There is no abdominal tenderness. There is no guarding.  Musculoskeletal:     Right lower leg: Edema present.     Left lower leg: Edema present.  Skin:    Comments: Right chest port in place  Neurological:     Mental Status: He is alert.     Imaging: No results found.  Labs:  CBC: Recent Labs    05/24/22 0816 05/31/22 3244  06/07/22 0906 06/14/22 0849  WBC 12.3* 15.5* 18.5* 34.7*  HGB 7.8* 8.7* 6.8* 6.8*  HCT 23.5* 26.3* 20.2* 20.4*  PLT 20* 22* 16* 22*    COAGS: No results for input(s): "INR", "APTT" in the last 8760 hours.  BMP: Recent Labs    05/17/22 0824 05/24/22 0816 05/31/22 0817 06/07/22 0906  NA 140 140 140 141  K 3.5 3.6 3.6 2.9*  CL 109 113* 111 113*  CO2 23 20* 21* 18*  GLUCOSE 149* 137* 113* 145*  BUN 26* 20 25* 33*  CALCIUM 8.5* 8.8* 8.8* 8.7*  CREATININE 1.20 1.26* 1.31* 1.59*  GFRNONAA 58* 55* 52* 41*    LIVER FUNCTION TESTS: Recent Labs    05/17/22 0824 05/24/22 0816 05/31/22 0817 06/07/22 0906  BILITOT 0.7 0.4 0.4 0.5  AST 19 13* 15 13*  ALT 13 11 12 12   ALKPHOS 59 61 59 57  PROT 6.3* 6.5 6.7 6.4*  ALBUMIN 3.1* 3.0* 3.2* 2.7*    TUMOR MARKERS: No results for input(s): "AFPTM", "CEA", "CA199", "CHROMGRNA" in the last 8760 hours.  Assessment and Plan: 88 yo male presents to IR for bone marrow biopsy to evaluate response to treatment for CML.   Pt is A&O, calm and pleasant.  He is in no distress.  Pt accompanied by son for procedure today.   Risks and benefits of bone marrow biopsy and aspiration with moderate sedation was discussed with the patient and/or patient's family including, but not limited to bleeding, infection, damage to adjacent structures or low yield requiring additional tests.  All of the questions were answered and there is agreement  to proceed.  Consent signed and in chart.  Thank you for this interesting consult.  I greatly enjoyed meeting MATTHER LABELL and look forward to participating in their care.  A copy of this report was sent to the requesting provider on this date.  Electronically Signed: Shon Hough, NP 06/15/2022, 10:00 AM   I spent a total of 20 minutes in face to face in clinical consultation, greater than 50% of which was counseling/coordinating care for CML.

## 2022-06-15 ENCOUNTER — Encounter (HOSPITAL_COMMUNITY): Payer: Self-pay

## 2022-06-15 ENCOUNTER — Ambulatory Visit (HOSPITAL_COMMUNITY)
Admission: RE | Admit: 2022-06-15 | Discharge: 2022-06-15 | Disposition: A | Discharge status: Home / Self Care | Payer: Medicare HMO | Source: Ambulatory Visit | Attending: Hematology | Admitting: Hematology

## 2022-06-15 ENCOUNTER — Other Ambulatory Visit: Payer: Self-pay

## 2022-06-15 ENCOUNTER — Ambulatory Visit: Payer: Medicare HMO

## 2022-06-15 VITALS — BP 138/70 | HR 82 | Temp 98.2°F | Resp 16 | Ht 69.0 in | Wt 154.3 lb

## 2022-06-15 DIAGNOSIS — C9200 Acute myeloblastic leukemia, not having achieved remission: Secondary | ICD-10-CM | POA: Diagnosis not present

## 2022-06-15 DIAGNOSIS — C9310 Chronic myelomonocytic leukemia not having achieved remission: Secondary | ICD-10-CM | POA: Diagnosis present

## 2022-06-15 DIAGNOSIS — M549 Dorsalgia, unspecified: Secondary | ICD-10-CM | POA: Insufficient documentation

## 2022-06-15 DIAGNOSIS — D696 Thrombocytopenia, unspecified: Secondary | ICD-10-CM | POA: Insufficient documentation

## 2022-06-15 DIAGNOSIS — Z1379 Encounter for other screening for genetic and chromosomal anomalies: Secondary | ICD-10-CM | POA: Diagnosis not present

## 2022-06-15 DIAGNOSIS — R0609 Other forms of dyspnea: Secondary | ICD-10-CM | POA: Insufficient documentation

## 2022-06-15 DIAGNOSIS — D469 Myelodysplastic syndrome, unspecified: Secondary | ICD-10-CM

## 2022-06-15 LAB — BPAM RBC
Blood Product Expiration Date: 202311272359
Blood Product Expiration Date: 202312052359
ISSUE DATE / TIME: 202311061041
ISSUE DATE / TIME: 202311061222
Unit Type and Rh: 1700
Unit Type and Rh: 5100

## 2022-06-15 LAB — TYPE AND SCREEN
ABO/RH(D): B POS
Antibody Screen: NEGATIVE
Unit division: 0
Unit division: 0

## 2022-06-15 LAB — CBC WITH DIFFERENTIAL/PLATELET
Abs Immature Granulocytes: 4.52 10*3/uL — ABNORMAL HIGH (ref 0.00–0.07)
Basophils Absolute: 0.2 10*3/uL — ABNORMAL HIGH (ref 0.0–0.1)
Basophils Relative: 1 %
Eosinophils Absolute: 0.3 10*3/uL (ref 0.0–0.5)
Eosinophils Relative: 1 %
HCT: 25.1 % — ABNORMAL LOW (ref 39.0–52.0)
Hemoglobin: 8.6 g/dL — ABNORMAL LOW (ref 13.0–17.0)
Immature Granulocytes: 13 %
Lymphocytes Relative: 12 %
Lymphs Abs: 4.3 10*3/uL — ABNORMAL HIGH (ref 0.7–4.0)
MCH: 30.2 pg (ref 26.0–34.0)
MCHC: 34.3 g/dL (ref 30.0–36.0)
MCV: 88.1 fL (ref 80.0–100.0)
Monocytes Absolute: 21.7 10*3/uL — ABNORMAL HIGH (ref 0.1–1.0)
Monocytes Relative: 59 %
Neutro Abs: 5.2 10*3/uL (ref 1.7–7.7)
Neutrophils Relative %: 14 %
Platelets: 19 10*3/uL — CL (ref 150–400)
RBC: 2.85 MIL/uL — ABNORMAL LOW (ref 4.22–5.81)
RDW: 15.5 % (ref 11.5–15.5)
WBC: 36.2 10*3/uL — ABNORMAL HIGH (ref 4.0–10.5)
nRBC: 0.1 % (ref 0.0–0.2)

## 2022-06-15 LAB — BASIC METABOLIC PANEL
Anion gap: 10 (ref 5–15)
BUN: 25 mg/dL — ABNORMAL HIGH (ref 8–23)
CO2: 20 mmol/L — ABNORMAL LOW (ref 22–32)
Calcium: 8.5 mg/dL — ABNORMAL LOW (ref 8.9–10.3)
Chloride: 114 mmol/L — ABNORMAL HIGH (ref 98–111)
Creatinine, Ser: 1.61 mg/dL — ABNORMAL HIGH (ref 0.61–1.24)
GFR, Estimated: 41 mL/min — ABNORMAL LOW (ref >60)
Glucose, Bld: 94 mg/dL (ref 70–99)
Potassium: 3.1 mmol/L — ABNORMAL LOW (ref 3.5–5.1)
Sodium: 144 mmol/L (ref 135–145)

## 2022-06-15 MED ORDER — FENTANYL CITRATE (PF) 100 MCG/2ML IJ SOLN
INTRAMUSCULAR | Status: AC | PRN
  Administered 2022-06-15 (×2): 50 ug via INTRAVENOUS

## 2022-06-15 MED ORDER — POTASSIUM CHLORIDE CRYS ER 20 MEQ PO TBCR: 20.0000 meq | EXTENDED_RELEASE_TABLET | Freq: Every day | ORAL | 1 refills | Status: DC

## 2022-06-15 MED ORDER — HEPARIN SOD (PORK) LOCK FLUSH 100 UNIT/ML IV SOLN
500.0000 [IU] | Freq: Once | INTRAVENOUS | Status: AC
  Administered 2022-06-15: 500 [IU] via INTRAVENOUS
  Filled 2022-06-15: qty 5

## 2022-06-15 MED ORDER — FENTANYL CITRATE (PF) 100 MCG/2ML IJ SOLN
INTRAMUSCULAR | Status: AC
  Filled 2022-06-15: qty 2

## 2022-06-15 MED ORDER — MIDAZOLAM HCL 2 MG/2ML IJ SOLN
INTRAMUSCULAR | Status: AC
  Filled 2022-06-15: qty 2

## 2022-06-15 MED ORDER — MIDAZOLAM HCL 2 MG/2ML IJ SOLN
INTRAMUSCULAR | Status: AC | PRN
  Administered 2022-06-15 (×2): .5 mg via INTRAVENOUS

## 2022-06-15 MED ORDER — LIDOCAINE HCL (PF) 1 % IJ SOLN
INTRAMUSCULAR | Status: AC | PRN
  Administered 2022-06-15: 20 mL

## 2022-06-15 MED ORDER — SODIUM CHLORIDE 0.9 % IV SOLN: INTRAVENOUS | Status: DC

## 2022-06-15 NOTE — Discharge Instructions (Signed)
Please call Interventional Radiology clinic [phone] with any questions or concerns.  You may remove your dressing and shower tomorrow.   Moderate Conscious Sedation, Adult, Care After This sheet gives you information about how to care for yourself after your procedure. Your health care provider may also give you more specific instructions. If you have problems or questions, contact your health careprovider. What can I expect after the procedure? After the procedure, it is common to have: Sleepiness for several hours. Impaired judgment for several hours. Difficulty with balance. Vomiting if you eat too soon. Follow these instructions at home: For the time period you were told by your health care provider: Rest. Do not participate in activities where you could fall or become injured. Do not drive or use machinery. Do not drink alcohol. Do not take sleeping pills or medicines that cause drowsiness. Do not make important decisions or sign legal documents. Do not take care of children on your own. Eating and drinking  Follow the diet recommended by your health care provider. Drink enough fluid to keep your urine pale yellow. If you vomit: Drink water, juice, or soup when you can drink without vomiting. Make sure you have little or no nausea before eating solid foods.  General instructions Take over-the-counter and prescription medicines only as told by your health care provider. Have a responsible adult stay with you for the time you are told. It is important to have someone help care for you until you are awake and alert. Do not smoke. Keep all follow-up visits as told by your health care provider. This is important. Contact a health care provider if: You are still sleepy or having trouble with balance after 24 hours. You feel light-headed. You keep feeling nauseous or you keep vomiting. You develop a rash. You have a fever. You have redness or swelling around the IV  site. Get help right away if: You have trouble breathing. You have new-onset confusion at home. Summary After the procedure, it is common to feel sleepy, have impaired judgment, or feel nauseous if you eat too soon. Rest after you get home. Know the things you should not do after the procedure. Follow the diet recommended by your health care provider and drink enough fluid to keep your urine pale yellow. Get help right away if you have trouble breathing or new-onset confusion at home. This information is not intended to replace advice given to you by your health care provider. Make sure you discuss any questions you have with your healthcare provider. Document Revised: 11/23/2019 Document Reviewed: 06/21/2019 Elsevier Patient Education  2022 Elsevier Inc.   Bone Marrow Aspiration and Bone Marrow Biopsy, Adult, Care After This sheet gives you information about how to care for yourself after your procedure. Your health care provider may also give you more specific instructions. If you have problems or questions, contact your health careprovider. What can I expect after the procedure? After the procedure, it is common to have: Mild pain and tenderness. Swelling. Bruising. Follow these instructions at home: Puncture site care Follow instructions from your health care provider about how to take care of the puncture site. Make sure you: Wash your hands with soap and water before and after you change your bandage (dressing). If soap and water are not available, use hand sanitizer. Change your dressing as told by your health care provider. Check your puncture site every day for signs of infection. Check for: More redness, swelling, or pain. Fluid or blood. Warmth. Pus or a   bad smell.  Activity Return to your normal activities as told by your health care provider. Ask your health care provider what activities are safe for you. Do not lift anything that is heavier than 10 lb (4.5 kg), or the  limit that you are told, until your health care provider says that it is safe. Do not drive for 24 hours if you were given a sedative during your procedure. General instructions Take over-the-counter and prescription medicines only as told by your health care provider. Do not take baths, swim, or use a hot tub until your health care provider approves. Ask your health care provider if you may take showers. You may only be allowed to take sponge baths. If directed, put ice on the affected area. To do this: Put ice in a plastic bag. Place a towel between your skin and the bag. Leave the ice on for 20 minutes, 2-3 times a day. Keep all follow-up visits as told by your health care provider. This is important.  Contact a health care provider if: Your pain is not controlled with medicine. You have a fever. You have more redness, swelling, or pain around the puncture site. You have fluid or blood coming from the puncture site. Your puncture site feels warm to the touch. You have pus or a bad smell coming from the puncture site. Summary After the procedure, it is common to have mild pain, tenderness, swelling, and bruising. Follow instructions from your health care provider about how to take care of the puncture site and what activities are safe for you. Take over-the-counter and prescription medicines only as told by your health care provider. Contact a health care provider if you have any signs of infection, such as fluid or blood coming from the puncture site. This information is not intended to replace advice given to you by your health care provider. Make sure you discuss any questions you have with your healthcare provider. Document Revised: 12/12/2018 Document Reviewed: 12/12/2018 Elsevier Patient Education  2022 Elsevier Inc.  

## 2022-06-15 NOTE — Progress Notes (Signed)
CRITICAL VALUE STICKER  CRITICAL VALUE: plt level 19    RECEIVER (on-site recipient of call): Otila Back, RN  DATE & TIME NOTIFIED: 06/15/22 11:53 AM  MESSENGER (representative from lab):Lab  MD NOTIFIED: Dr. Anselm Pancoast 11:57 via pager   TIME OF NOTIFICATION: 11:57   RESPONSE: Dr. Anselm Pancoast in procedure. Call back from [address], RT who relayed msg to provider.  Also called PA- K. Allred, PA who assessed patient during intake.  No new orders.

## 2022-06-15 NOTE — Telephone Encounter (Signed)
Patient presents to clinic with instructions from Interventional Radiologist to check with Dr. Delton Coombes regarding K3.1 today. Dr. Delton Coombes aware and has reviewed the chart. Dr. Delton Coombes has prescribed 25mq Potassium daily. Patient and son aware.

## 2022-06-15 NOTE — Procedures (Addendum)
Interventional Radiology Procedure:   Indications: Chronic myelomonocytic leukemia not having achieved remission   Procedure: CT guided bone marrow biopsy  Findings: 2 aspirates and 2 cores from right ilium  Complications: None     EBL: Minimal, less than 10 ml  Plan: Discharge to home in one hour.   Alyce Inscore R. Anselm Pancoast, MD  Pager: [phone]

## 2022-06-17 ENCOUNTER — Inpatient Hospital Stay (HOSPITAL_COMMUNITY)
Admission: EM | Admit: 2022-06-17 | Discharge: 2022-06-23 | DRG: 690 | Disposition: A | Discharge status: Skilled Nursing Facility | Payer: Medicare HMO | Attending: Family Medicine | Admitting: Family Medicine

## 2022-06-17 ENCOUNTER — Other Ambulatory Visit: Payer: Self-pay

## 2022-06-17 ENCOUNTER — Emergency Department (HOSPITAL_COMMUNITY): Payer: Medicare HMO

## 2022-06-17 ENCOUNTER — Encounter (HOSPITAL_COMMUNITY): Payer: Self-pay | Admitting: Emergency Medicine

## 2022-06-17 DIAGNOSIS — Z6822 Body mass index (BMI) 22.0-22.9, adult: Secondary | ICD-10-CM

## 2022-06-17 DIAGNOSIS — I10 Essential (primary) hypertension: Secondary | ICD-10-CM | POA: Diagnosis present

## 2022-06-17 DIAGNOSIS — Z886 Allergy status to analgesic agent status: Secondary | ICD-10-CM

## 2022-06-17 DIAGNOSIS — R531 Weakness: Principal | ICD-10-CM

## 2022-06-17 DIAGNOSIS — D638 Anemia in other chronic diseases classified elsewhere: Secondary | ICD-10-CM | POA: Diagnosis present

## 2022-06-17 DIAGNOSIS — R6510 Systemic inflammatory response syndrome (SIRS) of non-infectious origin without acute organ dysfunction: Secondary | ICD-10-CM | POA: Insufficient documentation

## 2022-06-17 DIAGNOSIS — Z888 Allergy status to other drugs, medicaments and biological substances status: Secondary | ICD-10-CM

## 2022-06-17 DIAGNOSIS — N390 Urinary tract infection, site not specified: Secondary | ICD-10-CM | POA: Diagnosis not present

## 2022-06-17 DIAGNOSIS — R7989 Other specified abnormal findings of blood chemistry: Secondary | ICD-10-CM | POA: Insufficient documentation

## 2022-06-17 DIAGNOSIS — R140 Abdominal distension (gaseous): Secondary | ICD-10-CM | POA: Diagnosis present

## 2022-06-17 DIAGNOSIS — E860 Dehydration: Secondary | ICD-10-CM | POA: Diagnosis not present

## 2022-06-17 DIAGNOSIS — K219 Gastro-esophageal reflux disease without esophagitis: Secondary | ICD-10-CM | POA: Insufficient documentation

## 2022-06-17 DIAGNOSIS — Z20822 Contact with and (suspected) exposure to covid-19: Secondary | ICD-10-CM | POA: Diagnosis present

## 2022-06-17 DIAGNOSIS — N1831 Chronic kidney disease, stage 3a: Secondary | ICD-10-CM | POA: Diagnosis present

## 2022-06-17 DIAGNOSIS — E876 Hypokalemia: Secondary | ICD-10-CM | POA: Insufficient documentation

## 2022-06-17 DIAGNOSIS — Z87891 Personal history of nicotine dependence: Secondary | ICD-10-CM

## 2022-06-17 DIAGNOSIS — I129 Hypertensive chronic kidney disease with stage 1 through stage 4 chronic kidney disease, or unspecified chronic kidney disease: Secondary | ICD-10-CM | POA: Diagnosis present

## 2022-06-17 DIAGNOSIS — D6481 Anemia due to antineoplastic chemotherapy: Secondary | ICD-10-CM | POA: Diagnosis present

## 2022-06-17 DIAGNOSIS — D696 Thrombocytopenia, unspecified: Secondary | ICD-10-CM | POA: Diagnosis present

## 2022-06-17 DIAGNOSIS — D649 Anemia, unspecified: Secondary | ICD-10-CM | POA: Insufficient documentation

## 2022-06-17 DIAGNOSIS — K5900 Constipation, unspecified: Secondary | ICD-10-CM | POA: Diagnosis present

## 2022-06-17 DIAGNOSIS — A419 Sepsis, unspecified organism: Secondary | ICD-10-CM

## 2022-06-17 DIAGNOSIS — R300 Dysuria: Secondary | ICD-10-CM | POA: Diagnosis present

## 2022-06-17 DIAGNOSIS — C9210 Chronic myeloid leukemia, BCR/ABL-positive, not having achieved remission: Secondary | ICD-10-CM | POA: Diagnosis present

## 2022-06-17 DIAGNOSIS — Z79899 Other long term (current) drug therapy: Secondary | ICD-10-CM

## 2022-06-17 DIAGNOSIS — E440 Moderate protein-calorie malnutrition: Secondary | ICD-10-CM | POA: Insufficient documentation

## 2022-06-17 DIAGNOSIS — B961 Klebsiella pneumoniae [K. pneumoniae] as the cause of diseases classified elsewhere: Secondary | ICD-10-CM | POA: Diagnosis present

## 2022-06-17 DIAGNOSIS — H5462 Unqualified visual loss, left eye, normal vision right eye: Secondary | ICD-10-CM | POA: Diagnosis present

## 2022-06-17 DIAGNOSIS — E872 Acidosis, unspecified: Secondary | ICD-10-CM | POA: Diagnosis present

## 2022-06-17 DIAGNOSIS — Z89022 Acquired absence of left finger(s): Secondary | ICD-10-CM

## 2022-06-17 DIAGNOSIS — N1832 Chronic kidney disease, stage 3b: Secondary | ICD-10-CM

## 2022-06-17 DIAGNOSIS — Z8249 Family history of ischemic heart disease and other diseases of the circulatory system: Secondary | ICD-10-CM

## 2022-06-17 LAB — CBC WITH DIFFERENTIAL/PLATELET
Abs Immature Granulocytes: 1.3 10*3/uL — ABNORMAL HIGH (ref 0.00–0.07)
Band Neutrophils: 5 %
Basophils Absolute: 0 10*3/uL (ref 0.0–0.1)
Basophils Relative: 0 %
Blasts: 4 %
Eosinophils Absolute: 0.3 10*3/uL (ref 0.0–0.5)
Eosinophils Relative: 1 %
HCT: 26.8 % — ABNORMAL LOW (ref 39.0–52.0)
Hemoglobin: 9 g/dL — ABNORMAL LOW (ref 13.0–17.0)
Lymphocytes Relative: 35 %
Lymphs Abs: 11.6 10*3/uL — ABNORMAL HIGH (ref 0.7–4.0)
MCH: 29.9 pg (ref 26.0–34.0)
MCHC: 33.6 g/dL (ref 30.0–36.0)
MCV: 89 fL (ref 80.0–100.0)
Metamyelocytes Relative: 2 %
Monocytes Absolute: 10.9 10*3/uL — ABNORMAL HIGH (ref 0.1–1.0)
Monocytes Relative: 33 %
Myelocytes: 2 %
Neutro Abs: 7.6 10*3/uL (ref 1.7–7.7)
Neutrophils Relative %: 18 %
RBC: 3.01 MIL/uL — ABNORMAL LOW (ref 4.22–5.81)
RDW: 16.1 % — ABNORMAL HIGH (ref 11.5–15.5)
Smear Review: DECREASED
WBC Morphology: ABNORMAL
WBC: 33 10*3/uL — ABNORMAL HIGH (ref 4.0–10.5)
nRBC: 0.1 % (ref 0.0–0.2)

## 2022-06-17 LAB — URINALYSIS, ROUTINE W REFLEX MICROSCOPIC
Bilirubin Urine: NEGATIVE
Glucose, UA: NEGATIVE mg/dL
Ketones, ur: NEGATIVE mg/dL
Nitrite: NEGATIVE
Protein, ur: 100 mg/dL — AB
Specific Gravity, Urine: 1.013 (ref 1.005–1.030)
WBC, UA: >50 WBC/hpf — ABNORMAL HIGH (ref 0–5)
pH: 5 (ref 5.0–8.0)

## 2022-06-17 LAB — COMPREHENSIVE METABOLIC PANEL
ALT: 20 U/L (ref 0–44)
AST: 20 U/L (ref 15–41)
Albumin: 2.4 g/dL — ABNORMAL LOW (ref 3.5–5.0)
Alkaline Phosphatase: 71 U/L (ref 38–126)
Anion gap: 9 (ref 5–15)
BUN: 26 mg/dL — ABNORMAL HIGH (ref 8–23)
CO2: 16 mmol/L — ABNORMAL LOW (ref 22–32)
Calcium: 8.3 mg/dL — ABNORMAL LOW (ref 8.9–10.3)
Chloride: 116 mmol/L — ABNORMAL HIGH (ref 98–111)
Creatinine, Ser: 1.53 mg/dL — ABNORMAL HIGH (ref 0.61–1.24)
GFR, Estimated: 43 mL/min — ABNORMAL LOW (ref >60)
Glucose, Bld: 129 mg/dL — ABNORMAL HIGH (ref 70–99)
Potassium: 3.2 mmol/L — ABNORMAL LOW (ref 3.5–5.1)
Sodium: 141 mmol/L (ref 135–145)
Total Bilirubin: 0.9 mg/dL (ref 0.3–1.2)
Total Protein: 6.3 g/dL — ABNORMAL LOW (ref 6.5–8.1)

## 2022-06-17 LAB — LACTIC ACID, PLASMA: Lactic Acid, Venous: 1.4 mmol/L (ref 0.5–1.9)

## 2022-06-17 LAB — RESP PANEL BY RT-PCR (FLU A&B, COVID) ARPGX2
Influenza A by PCR: NEGATIVE
Influenza B by PCR: NEGATIVE
SARS Coronavirus 2 by RT PCR: NEGATIVE

## 2022-06-17 LAB — BLOOD GAS, VENOUS
Acid-base deficit: 5.5 mmol/L — ABNORMAL HIGH (ref 0.0–2.0)
Bicarbonate: 17.9 mmol/L — ABNORMAL LOW (ref 20.0–28.0)
Drawn by: 66582
O2 Saturation: 65.1 %
Patient temperature: 36.8
pCO2, Ven: 27 mmHg — ABNORMAL LOW (ref 44–60)
pH, Ven: 7.43 (ref 7.25–7.43)
pO2, Ven: 37 mmHg (ref 32–45)

## 2022-06-17 LAB — SURGICAL PATHOLOGY

## 2022-06-17 LAB — BRAIN NATRIURETIC PEPTIDE: B Natriuretic Peptide: 167 pg/mL — ABNORMAL HIGH (ref 0.0–100.0)

## 2022-06-17 LAB — TROPONIN I (HIGH SENSITIVITY)
Troponin I (High Sensitivity): 11 ng/L (ref <18)
Troponin I (High Sensitivity): 14 ng/L (ref <18)

## 2022-06-17 LAB — TYPE AND SCREEN
ABO/RH(D): B POS
Antibody Screen: NEGATIVE

## 2022-06-17 LAB — D-DIMER, QUANTITATIVE: D-Dimer, Quant: 4.83 ug/mL-FEU — ABNORMAL HIGH (ref 0.00–0.50)

## 2022-06-17 MED ORDER — ONDANSETRON HCL 4 MG/2ML IJ SOLN
4.0000 mg | Freq: Four times a day (QID) | INTRAMUSCULAR | Status: DC | PRN
  Administered 2022-06-21 – 2022-06-23 (×2): 4 mg via INTRAVENOUS
  Filled 2022-06-17 (×2): qty 2

## 2022-06-17 MED ORDER — HEPARIN SODIUM (PORCINE) 5000 UNIT/ML IJ SOLN
5000.0000 [IU] | Freq: Three times a day (TID) | INTRAMUSCULAR | Status: DC
  Administered 2022-06-17 – 2022-06-18 (×2): 5000 [IU] via SUBCUTANEOUS
  Filled 2022-06-17 (×2): qty 1

## 2022-06-17 MED ORDER — LACTATED RINGERS IV SOLN: INTRAVENOUS | Status: AC

## 2022-06-17 MED ORDER — SODIUM CHLORIDE 0.9 % IV SOLN
2.0000 g | INTRAVENOUS | Status: DC
  Administered 2022-06-17 – 2022-06-21 (×5): 2 g via INTRAVENOUS
  Filled 2022-06-17 (×5): qty 20

## 2022-06-17 MED ORDER — ALBUTEROL SULFATE (2.5 MG/3ML) 0.083% IN NEBU: 2.5000 mg | INHALATION_SOLUTION | RESPIRATORY_TRACT | Status: DC | PRN

## 2022-06-17 MED ORDER — ACETAMINOPHEN 650 MG RE SUPP: 650.0000 mg | Freq: Four times a day (QID) | RECTAL | Status: DC | PRN

## 2022-06-17 MED ORDER — ALBUTEROL SULFATE HFA 108 (90 BASE) MCG/ACT IN AERS: 2.0000 | INHALATION_SPRAY | RESPIRATORY_TRACT | Status: DC | PRN

## 2022-06-17 MED ORDER — MORPHINE SULFATE (PF) 2 MG/ML IV SOLN
2.0000 mg | INTRAVENOUS | Status: DC | PRN
  Administered 2022-06-20 – 2022-06-23 (×5): 2 mg via INTRAVENOUS
  Filled 2022-06-17 (×5): qty 1

## 2022-06-17 MED ORDER — ACETAMINOPHEN 325 MG PO TABS
650.0000 mg | ORAL_TABLET | Freq: Four times a day (QID) | ORAL | Status: DC | PRN
  Administered 2022-06-20 – 2022-06-23 (×3): 650 mg via ORAL
  Filled 2022-06-17 (×3): qty 2

## 2022-06-17 MED ORDER — ONDANSETRON HCL 4 MG PO TABS: 4.0000 mg | ORAL_TABLET | Freq: Four times a day (QID) | ORAL | Status: DC | PRN

## 2022-06-17 MED ORDER — PANTOPRAZOLE SODIUM 40 MG PO TBEC
40.0000 mg | DELAYED_RELEASE_TABLET | Freq: Every day | ORAL | Status: DC
  Administered 2022-06-18 – 2022-06-23 (×6): 40 mg via ORAL
  Filled 2022-06-17 (×6): qty 1

## 2022-06-17 MED ORDER — OXYCODONE HCL 5 MG PO TABS
5.0000 mg | ORAL_TABLET | ORAL | Status: DC | PRN
  Administered 2022-06-19 – 2022-06-21 (×7): 5 mg via ORAL
  Filled 2022-06-17 (×7): qty 1

## 2022-06-17 MED ORDER — LACTATED RINGERS IV BOLUS (SEPSIS)
1000.0000 mL | Freq: Once | INTRAVENOUS | Status: AC
  Administered 2022-06-17: 1000 mL via INTRAVENOUS

## 2022-06-17 NOTE — ED Triage Notes (Addendum)
Pt via POV c/o SOB and tremors since earlier today. Pt has intermittent tremor noted in triage with O2 saturation of 96% on room air and tachycardic at 127 bpm. A/O x 4. Pt appears pale in triage and says he requires and received weekly blood transfusions due to chronic anemia.

## 2022-06-17 NOTE — Sepsis Progress Note (Signed)
Elink monitoring for the code sepsis protocol.  

## 2022-06-17 NOTE — ED Provider Notes (Signed)
[facility] EMERGENCY DEPARTMENT Provider Note   CSN: [id number] Arrival date & time: 06/17/22  1654     History  Chief Complaint  Patient presents with   Shortness of Breath    Dustin Tucker is a 88 y.o. male.  Pt with hx CML, c/o general weakness, chills, body aches, and non prod cough. Symptoms acute onset in past day, constant, moderate. Denies sore throat or runny nose. No specific known ill contacts. No chest pain. Mild sob/doe. No abd pain or nvd. No dysuria. No new extremity pain or swelling. No fevers. +recent chemotherapy.   The history is provided by the patient and medical records.  Shortness of Breath Associated symptoms: cough   Associated symptoms: no abdominal pain, no chest pain, no fever, no neck pain, no rash, no sore throat and no vomiting        Home Medications Prior to Admission medications   Medication Sig Start Date End Date Taking? Authorizing Provider  potassium chloride SA (KLOR-CON M) 20 MEQ tablet Take 1 tablet (20 mEq total) by mouth daily. 06/15/22   Derek Jack, MD  albuterol (VENTOLIN HFA) 108 (90 Base) MCG/ACT inhaler Inhale 2 puffs into the lungs every 6 (six) hours as needed for wheezing or shortness of breath. 07/28/21   [provider]  alfuzosin (UROXATRAL) 10 MG 24 hr tablet Take 1 tablet (10 mg total) by mouth daily with breakfast. 12/08/21   Franchot Gallo, MD  azaCITIDine 5 mg/2 mLs in lactated ringers infusion Inject 75 mg/m2 into the vein daily. Days 1-5 every 28 days 10/19/21   [provider]  cholecalciferol (VITAMIN D) 1000 units tablet Take 1,000 Units by mouth daily.    [provider]  cloNIDine (CATAPRES) 0.1 MG tablet Take 0.1 mg by mouth daily. 05/16/18   [provider]  docusate sodium (COLACE) 100 MG capsule Take 1 capsule (100 mg total) by mouth 2 (two) times daily. 12/01/21   Derek Jack, MD  furosemide (LASIX) 20 MG tablet Take 1 tablet (20 mg total) by mouth daily  as needed (take as needed every morning for ankle swelling). 03/15/22   Derek Jack, MD  hydrochlorothiazide (HYDRODIURIL) 25 MG tablet Take 25 mg by mouth daily. 06/25/16   [provider]  levocetirizine (XYZAL) 5 MG tablet Take 5 mg by mouth daily. 07/28/21   [provider]  lisinopril (PRINIVIL,ZESTRIL) 40 MG tablet Take 40 mg by mouth daily. 06/09/16   [provider]  megestrol (MEGACE) 400 MG/10ML suspension Take 10 mLs (400 mg total) by mouth 2 (two) times daily. 01/25/22   Derek Jack, MD  pantoprazole (PROTONIX) 40 MG tablet Take 1 tablet (40 mg total) by mouth daily. 08/09/21   Cherene Altes, MD  sulfamethoxazole-trimethoprim (BACTRIM DS) 800-160 MG tablet Take 1 tablet by mouth 2 (two) times daily. 02/16/22   Franchot Gallo, MD  vitamin B-12 (CYANOCOBALAMIN) 1000 MCG tablet Take 1,000 mcg by mouth daily.    [provider]  prochlorperazine (COMPAZINE) 10 MG tablet TAKE (1) TABLET BY MOUTH EVERY SIX HOURS AS NEEDED. 11/12/21 04/10/22  Derek Jack, MD      Allergies    Aspirin and Flomax [tamsulosin]    Review of Systems   Review of Systems  Constitutional:  Positive for chills. Negative for fever.  HENT:  Negative for sore throat.   Eyes:  Negative for redness.  Respiratory:  Positive for cough and shortness of breath.   Cardiovascular:  Negative for chest pain.  Gastrointestinal:  Negative for abdominal pain, diarrhea and vomiting.  Genitourinary:  Negative for dysuria and flank pain.  Musculoskeletal:  Positive for myalgias. Negative for back pain, neck pain and neck stiffness.  Skin:  Negative for rash.  Neurological:  Negative for weakness and numbness.  Hematological:  Does not bruise/bleed easily.  Psychiatric/Behavioral:  Negative for confusion.     Physical Exam Updated Vital Signs BP (!) 150/73 (BP Location: Right Arm)   Pulse (!) 126   Temp 98.3 F (36.8 C) (Oral)   Resp (!) 26   Ht 1.753 m (5'  9")   Wt 68.9 kg   SpO2 99%   BMI 22.45 kg/m  Physical Exam Vitals and nursing note reviewed.  Constitutional:      Appearance: Normal appearance. He is well-developed.  HENT:     Head: Atraumatic.     Nose: Nose normal.     Mouth/Throat:     Mouth: Mucous membranes are moist.     Pharynx: Oropharynx is clear.  Eyes:     General: No scleral icterus.    Conjunctiva/sclera: Conjunctivae normal.     Comments: Blindness left eye/chronic  Neck:     Trachea: No tracheal deviation.     Comments: No stiffness or rigidity.  Cardiovascular:     Rate and Rhythm: Normal rate and regular rhythm.     Pulses: Normal pulses.     Heart sounds: Normal heart sounds. No murmur heard.    No friction rub. No gallop.  Pulmonary:     Effort: Pulmonary effort is normal. No accessory muscle usage or respiratory distress.     Breath sounds: Normal breath sounds.     Comments: Port right chest without sign of infection. Abdominal:     General: Bowel sounds are normal. There is no distension.     Palpations: Abdomen is soft.     Tenderness: There is no abdominal tenderness.  Genitourinary:    Comments: No cva tenderness. Musculoskeletal:        General: No swelling or tenderness.     Cervical back: Normal range of motion and neck supple. No rigidity.     Right lower leg: No edema.     Left lower leg: No edema.  Skin:    General: Skin is warm and dry.     Findings: No rash.  Neurological:     Mental Status: He is alert.     Comments: Alert, speech clear. Motor/sens grossly intact bil.   Psychiatric:        Mood and Affect: Mood normal.     ED Results / Procedures / Treatments   Labs (all labs ordered are listed, but only abnormal results are displayed) Results for orders placed or performed during the hospital encounter of 06/17/22  Blood culture (routine x 2)   Specimen: BLOOD  Result Value Ref Range   Specimen Description BLOOD BLOOD RIGHT HAND    Special Requests       Immunocompromised BOTTLES DRAWN AEROBIC AND ANAEROBIC Blood Culture adequate volume Performed at [facility], [address], [place], [place] [address]    Culture PENDING    Report Status PENDING   Blood culture (routine x 2)   Specimen: Porta Cath; Blood  Result Value Ref Range   Specimen Description PORTA CATH    Special Requests      Immunocompromised BOTTLES DRAWN AEROBIC AND ANAEROBIC Blood Culture adequate volume Performed at [facility], [address], [place], [place] [address]    Culture PENDING  Report Status PENDING   Comprehensive metabolic panel  Result Value Ref Range   Sodium 141 135 - 145 mmol/L   Potassium 3.2 (L) 3.5 - 5.1 mmol/L   Chloride 116 (H) 98 - 111 mmol/L   CO2 16 (L) 22 - 32 mmol/L   Glucose, Bld 129 (H) 70 - 99 mg/dL   BUN 26 (H) 8 - 23 mg/dL   Creatinine, Ser 1.53 (H) 0.61 - 1.24 mg/dL   Calcium 8.3 (L) 8.9 - 10.3 mg/dL   Total Protein 6.3 (L) 6.5 - 8.1 g/dL   Albumin 2.4 (L) 3.5 - 5.0 g/dL   AST 20 15 - 41 U/L   ALT 20 0 - 44 U/L   Alkaline Phosphatase 71 38 - 126 U/L   Total Bilirubin 0.9 0.3 - 1.2 mg/dL   GFR, Estimated 43 (L) >60 mL/min   Anion gap 9 5 - 15  Lactic acid, plasma  Result Value Ref Range   Lactic Acid, Venous 1.4 0.5 - 1.9 mmol/L  Urinalysis, Routine w reflex microscopic  Result Value Ref Range   Color, Urine AMBER (A) YELLOW   APPearance CLOUDY (A) CLEAR   Specific Gravity, Urine 1.013 1.005 - 1.030   pH 5.0 5.0 - 8.0   Glucose, UA NEGATIVE NEGATIVE mg/dL   Hgb urine dipstick MODERATE (A) NEGATIVE   Bilirubin Urine NEGATIVE NEGATIVE   Ketones, ur NEGATIVE NEGATIVE mg/dL   Protein, ur 100 (A) NEGATIVE mg/dL   Nitrite NEGATIVE NEGATIVE   Leukocytes,Ua LARGE (A) NEGATIVE   RBC / HPF 21-50 0 - 5 RBC/hpf   WBC, UA >50 (H) 0 - 5 WBC/hpf   Bacteria, UA MANY (A) NONE SEEN   Squamous Epithelial / LPF 0-5 0 - 5   WBC Clumps PRESENT   CBC with Differential  Result Value Ref Range   WBC 33.0 (H) 4.0 - 10.5 K/uL   RBC  3.01 (L) 4.22 - 5.81 MIL/uL   Hemoglobin 9.0 (L) 13.0 - 17.0 g/dL   HCT 26.8 (L) 39.0 - 52.0 %   MCV 89.0 80.0 - 100.0 fL   MCH 29.9 26.0 - 34.0 pg   MCHC 33.6 30.0 - 36.0 g/dL   RDW 16.1 (H) 11.5 - 15.5 %   Platelets DCLMP 150 - 400 K/uL   nRBC 0.1 0.0 - 0.2 %   Neutrophils Relative % 18 %   Neutro Abs 7.6 1.7 - 7.7 K/uL   Band Neutrophils 5 %   Lymphocytes Relative 35 %   Lymphs Abs 11.6 (H) 0.7 - 4.0 K/uL   Monocytes Relative 33 %   Monocytes Absolute 10.9 (H) 0.1 - 1.0 K/uL   Eosinophils Relative 1 %   Eosinophils Absolute 0.3 0.0 - 0.5 K/uL   Basophils Relative 0 %   Basophils Absolute 0.0 0.0 - 0.1 K/uL   WBC Morphology Abnormal lymphocytes present    RBC Morphology MORPHOLOGY UNREMARKABLE    Smear Review PLATELETS APPEAR DECREASED    Metamyelocytes Relative 2 %   Myelocytes 2 %   Blasts 4 %   Abs Immature Granulocytes 1.30 (H) 0.00 - 0.07 K/uL   Abnormal Lymphocytes Present PRESENT    Large Granular Lymphocytes PRESENT    Reactive, Benign Lymphocytes PRESENT    Plasma cells PRESENT    Smudge Cells PRESENT   Brain natriuretic peptide  Result Value Ref Range   B Natriuretic Peptide 167.0 (H) 0.0 - 100.0 pg/mL  Type and screen  Result Value Ref Range   ABO/RH(D) B  POS    Antibody Screen NEG    Sample Expiration      06/20/2022,2359 Performed at [facility], [address], [place], [place] [address]   Troponin I (High Sensitivity)  Result Value Ref Range   Troponin I (High Sensitivity) 11 <18 ng/L   CT BONE MARROW BIOPSY & ASPIRATION  Result Date: 06/15/2022 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 EXAM: CT GUIDED BONE MARROW ASPIRATES AND BIOPSY Physician: Stephan Minister. Anselm Pancoast, MD MEDICATIONS: None. ANESTHESIA/SEDATION: Moderate (conscious) sedation was employed during this procedure. A total of Versed 106m and fentanyl 100 mcg was administered intravenously at the order of the provider performing the procedure. Total intra-service  moderate sedation time: 12 minutes. Patient's level of consciousness and vital signs were monitored continuously by radiology nurse throughout the procedure under the supervision of the provider performing the procedure. COMPLICATIONS: None immediate. PROCEDURE: The procedure was explained to the patient. The risks and benefits of the procedure were discussed and the patient's questions were addressed. Informed consent was obtained from the patient. The patient was placed prone on CT table. Images of the pelvis were obtained. The right side of back was prepped and draped in sterile fashion. The skin and right posterior ilium were anesthetized with 1% lidocaine. 11 gauge bone needle was directed into the right ilium with CT guidance. Two aspirates and two core biopsies were obtained. Bandage placed over the puncture site. RADIATION DOSE REDUCTION: This exam was performed according to the departmental dose-optimization program which includes automated exposure control, adjustment of the mA and/or kV according to patient size and/or use of iterative reconstruction technique. IMPRESSION: CT guided bone marrow aspiration and core biopsy. Electronically Signed   By: AMarkus DaftM.D.   On: 06/15/2022 16:36    EKG EKG Interpretation  Date/Time:  Thursday June 17 2022 18:28:32 EST Ventricular Rate:  110 PR Interval:  186 QRS Duration: 93 QT Interval:  324 QTC Calculation: 439 R Axis:   37 Text Interpretation: Atrial fibrillation Non-specific ST-t changes Baseline wander Confirmed by SLajean [phone] on 06/17/2022 6:41:18 PM  Radiology DG Chest Port 1 View  Result Date: 06/17/2022 CLINICAL DATA:  Shortness of breath. EXAM: PORTABLE CHEST 1 VIEW COMPARISON:  Radiograph 10/16/2021 FINDINGS: Accessed right chest port in place. Chronic elevation of right hemidiaphragm. Stable mild cardiomegaly with unchanged mediastinal contours. No acute airspace disease, pleural effusion or pneumothorax. Stable osseous  structures. IMPRESSION: 1. No acute abnormality. Stable mild cardiomegaly. 2. Chronic elevation of right hemidiaphragm. Electronically Signed   By: MKeith RakeM.D.   On: 06/17/2022 19:31    Procedures Procedures    Medications Ordered in ED Medications  albuterol (VENTOLIN HFA) 108 (90 Base) MCG/ACT inhaler 2 puff (has no administration in time range)    ED Course/ Medical Decision Making/ A&P                           Medical Decision Making Problems Addressed: Chronic anemia: chronic illness or injury with exacerbation, progression, or side effects of treatment that poses a threat to life or bodily functions CML (chronic myelocytic [place]) [place]: chronic illness or injury with exacerbation, progression, or side effects of treatment that poses a threat to life or bodily functions Dehydration: acute illness or injury with systemic symptoms that poses a threat to life or bodily functions Generalized weakness: acute illness or injury with systemic symptoms Hypokalemia: acute illness or injury Sepsis secondary to UTI ([facility]: acute illness or  injury with systemic symptoms that poses a threat to life or bodily functions Stage 3b chronic kidney disease ([place]): chronic illness or injury with exacerbation, progression, or side effects of treatment that poses a threat to life or bodily functions Thrombocytopenia ([place]): chronic illness or injury  Amount and/or Complexity of Data Reviewed Independent Historian:     Details: Fam, hx External Data Reviewed: notes. Labs: ordered. Decision-making details documented in ED Course. Radiology: ordered and independent interpretation performed. Decision-making details documented in ED Course. ECG/medicine tests: ordered and independent interpretation performed. Decision-making details documented in ED Course. Discussion of management or test interpretation with external provider(s): hospitalists  Risk Prescription drug management. Decision  regarding hospitalization.   Iv ns. Continuous pulse ox and cardiac monitoring. Labs ordered/sent. Imaging ordered.   Reviewed nursing notes and prior charts for additional history. External reports reviewed. Additional history from:  Cardiac monitor: afib, 120.   Labs reviewed/interpreted by me - lactate normal. Wbc chr high. Hco3 low. K low. Ua c/w uti.   Xrays reviewed/interpreted by me - no pna.   Cultures sent.   Ivf bolus.  Iv abx.   Hospitalists consulted for admission.  CRITICAL CARE RE: general weakness, cml, uti w urosepsis, ckd.  Performed by: Mirna Mires Total critical care time: 40 minutes Critical care time was exclusive of separately billable procedures and treating other patients. Critical care was necessary to treat or prevent imminent or life-threatening deterioration. Critical care was time spent personally by me on the following activities: development of treatment plan with patient and/or surrogate as well as nursing, discussions with consultants, evaluation of patient's response to treatment, examination of patient, obtaining history from patient or surrogate, ordering and performing treatments and interventions, ordering and review of laboratory studies, ordering and review of radiographic studies, pulse oximetry and re-evaluation of patient's condition.           Final Clinical Impression(s) / ED Diagnoses Final diagnoses:  None    Rx / DC Orders ED Discharge Orders     None         Lajean Saver, MD 06/17/22 2021

## 2022-06-17 NOTE — Plan of Care (Signed)

## 2022-06-18 ENCOUNTER — Encounter (HOSPITAL_COMMUNITY): Payer: Self-pay | Admitting: Family Medicine

## 2022-06-18 ENCOUNTER — Observation Stay (HOSPITAL_COMMUNITY): Payer: Medicare HMO

## 2022-06-18 DIAGNOSIS — I10 Essential (primary) hypertension: Secondary | ICD-10-CM

## 2022-06-18 DIAGNOSIS — E860 Dehydration: Secondary | ICD-10-CM

## 2022-06-18 DIAGNOSIS — N3000 Acute cystitis without hematuria: Secondary | ICD-10-CM | POA: Diagnosis not present

## 2022-06-18 DIAGNOSIS — D649 Anemia, unspecified: Secondary | ICD-10-CM

## 2022-06-18 DIAGNOSIS — E876 Hypokalemia: Secondary | ICD-10-CM | POA: Insufficient documentation

## 2022-06-18 DIAGNOSIS — R7989 Other specified abnormal findings of blood chemistry: Secondary | ICD-10-CM | POA: Insufficient documentation

## 2022-06-18 DIAGNOSIS — R6510 Systemic inflammatory response syndrome (SIRS) of non-infectious origin without acute organ dysfunction: Secondary | ICD-10-CM | POA: Insufficient documentation

## 2022-06-18 DIAGNOSIS — K219 Gastro-esophageal reflux disease without esophagitis: Secondary | ICD-10-CM

## 2022-06-18 LAB — BLOOD CULTURE ID PANEL (REFLEXED) - BCID2

## 2022-06-18 LAB — CBC WITH DIFFERENTIAL/PLATELET
Abs Immature Granulocytes: 7.8 10*3/uL — ABNORMAL HIGH (ref 0.00–0.07)
Band Neutrophils: 1 %
Basophils Absolute: 0 10*3/uL (ref 0.0–0.1)
Basophils Relative: 0 %
Blasts: 7 %
Eosinophils Absolute: 0 10*3/uL (ref 0.0–0.5)
Eosinophils Relative: 0 %
HCT: 23.4 % — ABNORMAL LOW (ref 39.0–52.0)
Hemoglobin: 7.9 g/dL — ABNORMAL LOW (ref 13.0–17.0)
Lymphocytes Relative: 15 %
Lymphs Abs: 6.9 10*3/uL — ABNORMAL HIGH (ref 0.7–4.0)
MCH: 30.2 pg (ref 26.0–34.0)
MCHC: 33.8 g/dL (ref 30.0–36.0)
MCV: 89.3 fL (ref 80.0–100.0)
Metamyelocytes Relative: 4 %
Monocytes Absolute: 19.8 10*3/uL — ABNORMAL HIGH (ref 0.1–1.0)
Monocytes Relative: 43 %
Myelocytes: 10 %
Neutro Abs: 8.3 10*3/uL — ABNORMAL HIGH (ref 1.7–7.7)
Neutrophils Relative %: 17 %
Platelets: 13 10*3/uL — CL (ref 150–400)
Promyelocytes Relative: 3 %
RBC: 2.62 MIL/uL — ABNORMAL LOW (ref 4.22–5.81)
RDW: 16.2 % — ABNORMAL HIGH (ref 11.5–15.5)
WBC Morphology: ABNORMAL
WBC: 46 10*3/uL — ABNORMAL HIGH (ref 4.0–10.5)
nRBC: 0 % (ref 0.0–0.2)

## 2022-06-18 LAB — COMPREHENSIVE METABOLIC PANEL
ALT: 59 U/L — ABNORMAL HIGH (ref 0–44)
AST: 83 U/L — ABNORMAL HIGH (ref 15–41)
Albumin: 2.1 g/dL — ABNORMAL LOW (ref 3.5–5.0)
Alkaline Phosphatase: 61 U/L (ref 38–126)
Anion gap: 7 (ref 5–15)
BUN: 22 mg/dL (ref 8–23)
CO2: 18 mmol/L — ABNORMAL LOW (ref 22–32)
Calcium: 8.3 mg/dL — ABNORMAL LOW (ref 8.9–10.3)
Chloride: 119 mmol/L — ABNORMAL HIGH (ref 98–111)
Creatinine, Ser: 1.44 mg/dL — ABNORMAL HIGH (ref 0.61–1.24)
GFR, Estimated: 47 mL/min — ABNORMAL LOW (ref >60)
Glucose, Bld: 109 mg/dL — ABNORMAL HIGH (ref 70–99)
Potassium: 3.4 mmol/L — ABNORMAL LOW (ref 3.5–5.1)
Sodium: 144 mmol/L (ref 135–145)
Total Bilirubin: 0.7 mg/dL (ref 0.3–1.2)
Total Protein: 5.6 g/dL — ABNORMAL LOW (ref 6.5–8.1)

## 2022-06-18 LAB — MAGNESIUM: Magnesium: 2.1 mg/dL (ref 1.7–2.4)

## 2022-06-18 MED ORDER — POTASSIUM CHLORIDE 20 MEQ PO PACK
40.0000 meq | PACK | Freq: Once | ORAL | Status: AC
  Administered 2022-06-18: 40 meq via ORAL
  Filled 2022-06-18: qty 2

## 2022-06-18 MED ORDER — TECHNETIUM TO 99M ALBUMIN AGGREGATED
4.0000 | Freq: Once | INTRAVENOUS | Status: AC | PRN
  Administered 2022-06-18: 4.4 via INTRAVENOUS

## 2022-06-18 NOTE — Care Management Obs Status (Signed)
[place] NOTIFICATION   Patient Details  Name: Dustin Tucker MRN: [id number] Date of Birth: 1932/02/17   Medicare Observation Status Notification Given:  Yes    Tommy Medal 06/18/2022, 4:32 PM

## 2022-06-18 NOTE — H&P (Signed)
History and Physical    Patient: Dustin Tucker MWN:[id number] DOB: 16-Jul-1932 DOA: 06/17/2022 DOS: the patient was seen and examined on 06/18/2022 PCP: Jani Gravel, MD  Patient coming from: Home  Chief Complaint:  Chief Complaint  Patient presents with   Shortness of Breath   HPI: Dustin Tucker is a 88 y.o. male with medical history significant of hypertension, GERD, CML, and more presents to ED with chief complaint of fatigue, malaise, myalgias, dyspnea.  Patient reports this all started approximately 24 hours prior to presentation.  Patient reports the myalgias have been the most bothersome.  He has not had any cough.  His dyspnea started at the same time as the myalgias.  It is not worse on exertion.  He has no associated chest pain, no associated fever.  Patient reports he feels dehydrated.  He denies palpitations and near syncope.  He reports that he does have generalized weakness.  He has had no appetite.  He has had an unknown amount of weight loss.  Patient reports that at first he attributed his appetite to chemo, but he has not had a chemo treatment in a month because of the weight loss per his report. He has had some dysuria but he is not sure when it started.  He denies any hematuria.  He does have associated concentration of his urine per his report it is darker in color.  Patient has no other complaints at this time.  Patient does not smoke, does not drink, does not use illicit drugs.  He is vaccinated for COVID.  Patient is full code.   Review of Systems: As mentioned in the history of present illness. All other systems reviewed and are negative. 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   Hypertension    Port-A-Cath in place 10/15/2021   Past Surgical History:  Procedure Laterality Date   CARPAL TUNNEL RELEASE Right    COLONOSCOPY N/A 09/19/2013   Procedure: COLONOSCOPY;  Surgeon: Rogene Houston, MD;  Location:  [facility];  Service: Endoscopy;  Laterality: N/A;  830   FINGER AMPUTATION Left    4th and 5th   left eye blindness Left    PORTACATH PLACEMENT Right 10/16/2021   Procedure: INSERTION PORT-A-CATH;  Surgeon: Aviva Signs, MD;  Location: [facility];  Service: General;  Laterality: Right;   SVT ABLATION N/A 09/21/2018   Procedure: SVT ABLATION;  Surgeon: Constance Haw, MD;  Location: [place] [facility];  Service: Cardiovascular;  Laterality: N/A;   Social History:  reports that he quit smoking about 42 years ago. His smoking use included cigarettes. He started smoking about 65 years ago. He smoked an average of .5 packs per day. He has never used smokeless tobacco. He reports that he does not drink alcohol and does not use drugs.  Allergies  Allergen Reactions   Aspirin Other (See Comments)    Gi upset, can tolerate baby aspirin   Flomax [Tamsulosin] Nausea Only    Family History  Problem Relation Age of Onset   CAD Mother    CAD Father    Cancer Brother     Prior to Admission medications   Medication Sig Start Date End Date Taking? Authorizing Provider  acetaminophen (TYLENOL) 500 MG tablet Take 500 mg by mouth at bedtime.   Yes [provider]  alfuzosin (UROXATRAL) 10 MG 24 hr tablet Take 1 tablet (  10 mg total) by mouth daily with breakfast. 12/08/21  Yes Dahlstedt, Annie Main, MD  azaCITIDine 5 mg/2 mLs in lactated ringers infusion Inject 75 mg/m2 into the vein daily. Days 1-5 every 28 days 10/19/21  Yes [provider]  docusate sodium (COLACE) 100 MG capsule Take 1 capsule (100 mg total) by mouth 2 (two) times daily. 12/01/21  Yes Derek Jack, MD  furosemide (LASIX) 20 MG tablet Take 1 tablet (20 mg total) by mouth daily as needed (take as needed every morning for ankle swelling). 03/15/22  Yes Derek Jack, MD  levocetirizine (XYZAL) 5 MG tablet Take 5 mg by mouth daily. 07/28/21  Yes [provider]  megestrol (MEGACE) 400 MG/10ML  suspension Take 10 mLs (400 mg total) by mouth 2 (two) times daily. 01/25/22  Yes Derek Jack, MD  pantoprazole (PROTONIX) 40 MG tablet Take 1 tablet (40 mg total) by mouth daily. 08/09/21  Yes Cherene Altes, MD  potassium chloride SA (KLOR-CON M) 20 MEQ tablet Take 1 tablet (20 mEq total) by mouth daily. 06/15/22  Yes Derek Jack, MD  albuterol (VENTOLIN HFA) 108 (90 Base) MCG/ACT inhaler Inhale 2 puffs into the lungs every 6 (six) hours as needed for wheezing or shortness of breath. Patient not taking: Reported on 06/17/2022 07/28/21   [provider]  cloNIDine (CATAPRES) 0.1 MG tablet Take 0.1 mg by mouth daily. 05/16/18   [provider]  sulfamethoxazole-trimethoprim (BACTRIM DS) 800-160 MG tablet Take 1 tablet by mouth 2 (two) times daily. Patient not taking: Reported on 06/17/2022 02/16/22   Franchot Gallo, MD  prochlorperazine (COMPAZINE) 10 MG tablet TAKE (1) TABLET BY MOUTH EVERY SIX HOURS AS NEEDED. 11/12/21 04/10/22  Derek Jack, MD    Physical Exam: Vitals:   06/17/22 2159 06/17/22 2203 06/17/22 2233 06/18/22 0210  BP:  131/79 139/77 (!) 132/51  Pulse:  (!) 103 (!) 105 100  Resp:  (!) '22 20 20  '$ Temp:  100.1 F (37.8 C) 99.2 F (37.3 C) 98.8 F (37.1 C)  TempSrc:   Oral Oral  SpO2:  100% 100% 98%  Weight: 67.4 kg     Height: '5\' 5"'$  (1.651 m)      1.  General: Patient lying supine in bed,  no acute distress   2. Psychiatric: Alert and oriented x 3, mood and behavior normal for situation, pleasant and cooperative with exam   3. Neurologic: Speech and language are normal, face is symmetric, moves all 4 extremities voluntarily, at baseline without acute deficits on limited exam   4. HEENMT:  Head is atraumatic, normocephalic, pupils reactive to light, neck is supple, trachea is midline, mucous membranes are moist   5. Respiratory : Tachypneic, lungs are clear to auscultation bilaterally without wheezing, rhonchi, rales, no  cyanosis, no increase in work of breathing or accessory muscle use   6. Cardiovascular : Heart rate tachycardic, rhythm is regular, no murmurs, rubs or gallops, no peripheral edema, peripheral pulses palpated   7. Gastrointestinal:  Abdomen is soft, nondistended, nontender to palpation bowel sounds active, no masses or organomegaly palpated   8. Skin:  Skin is warm, dry and intact without rashes, acute lesions, or ulcers on limited exam   9.Musculoskeletal:  No acute deformities or trauma, no asymmetry in tone, no peripheral edema, peripheral pulses palpated, no tenderness to palpation in the extremities  Data Reviewed: In the ED Temp 98.3, heart rate 82-126, respiratory rate 26-34, blood pressure 121/58-150/73, satting 98-99% BNP 167 Leukocytosis in the setting of CML  with a white blood cell count of 33, hemoglobin 9.0 Chemistry reveals a hypokalemia hyperchloremia, decreased bicarb, elevated BUN and creatinine-but creatinine is near baseline Trope stable at 11, 14 Blood culture pending Chest x-ray shows no acute cardiopulmonary abnormality. UA shows greater than 50 white blood cells-negative UTI especially with patient's complaint of dysuria COVID and flu negative Albuterol, Rocephin, 1 L LR given in the ED Admission requested for SIRS criteria and a chronically ill gentleman with risk of decompensation  Assessment and Plan: * UTI (urinary tract infection) - UA is indicative of UTI - Continue Rocephin - His urine culture has grown Klebsiella that is sensitive to Rocephin - Updated urine culture pending - Blood cultures pending - Continue to monitor  Elevated d-dimer - D-dimer 4.83 - Patient has been tachypneic from 26-34 with some tachycardia @ 126 - Given renal insufficiency VQ scan in the a.m.   Dehydration - Secondary to poor p.o. intake - Electrolyte derangements are associated with chloride 116, bicarb 16, BUN 26, creatinine is close to baseline at 1.53 - Continue  IV hydration  Hypokalemia - Likely secondary to poor p.o. intake - Replace and recheck - Monitor on telemetry  GERD (gastroesophageal reflux disease) - Continue Protonix  Chronic anemia - Hemoglobin currently 9.0 - This seems to be his baseline - No signs of bleeding - Known myelodysplastic syndrome - Last chemo treatment was 1 month ago per his report - Continue to monitor  SIRS (systemic inflammatory response syndrome) (HCC) - Heart rate 126, respiratory rate 26/34, elevated white blood cell count and the setting of leukemia - No evidence of endorgan damage - Continue to treat per UTI  Essential hypertension - Looks like Clonidine is currently being held in the outpatient setting - Blood pressure stable 121/58 - Continue to monitor      Advance Care Planning:   Code Status: Full Code   Consults: None  Family Communication: No family at bedside  Severity of Illness: The appropriate patient status for this patient is OBSERVATION. Observation status is judged to be reasonable and necessary in order to provide the required intensity of service to ensure the patient's safety. The patient's presenting symptoms, physical exam findings, and initial radiographic and laboratory data in the context of their medical condition is felt to place them at decreased risk for further clinical deterioration. Furthermore, it is anticipated that the patient will be medically stable for discharge from the hospital within 2 midnights of admission.   Author: Rolla Plate, DO 06/18/2022 2:51 AM  For on call review [email or web address].

## 2022-06-18 NOTE — Assessment & Plan Note (Signed)
-  Continue Protonix -Patient denies nausea/vomiting and currently no reflux symptoms reported.

## 2022-06-18 NOTE — Assessment & Plan Note (Addendum)
-   Secondary to poor p.o. intake - Significant abnormality in patient's electrolytes profile including metabolic acidosis with low bicarbonate, hyponatremia and hypokalemia. -Continue to maintain adequate hydration -Continue electrolyte repletion and follow trend.   -Renal function/creatinine close to baseline after fluid resuscitation provided.Marland Kitchen

## 2022-06-18 NOTE — Assessment & Plan Note (Addendum)
-   Hemoglobin currently 8.3; most likely due to hemodilution. - no overt bleeding appreciated -Most likely anemia of chronic disease and induced by chemotherapy -Continue to follow hemoglobin trend -Transfuse for hemoglobin less than 7.

## 2022-06-18 NOTE — Assessment & Plan Note (Addendum)
-   D-dimer 4.83 - Patient has been tachypneic from 26-34 with some tachycardia @ 126 - Given renal insufficiency VQ scan in the a.m.

## 2022-06-18 NOTE — Progress Notes (Signed)
Lab called and patient had a blood culture aerobic bottle showed up as positive for gram negative rods, notified Dr. Dyann Kief

## 2022-06-18 NOTE — Assessment & Plan Note (Signed)
-  Heart rate 126, respiratory rate around 26-34 at time of admission; and most likely driven by dehydration process.   -Patient WBC is elevated in the setting of leukemia; despite presence of UTI patient's dehydration is most likely responsible for tachycardia. -Continue to treat for UTI -Sepsis has not rule in given presentation and explanation for underlying abnormalities.Marland Kitchen

## 2022-06-18 NOTE — TOC Progression Note (Signed)
Transition of Care [facility]) - Progression Note    Patient Details  Name: Dustin Tucker MRN: [id number] Date of Birth: Jun 03, 1932  Transition of Care [facility]) CM/SW Contact  Salome Arnt, [place] Phone Number: 06/18/2022, 10:40 AM  Clinical Narrative:  Transition of Care [facility]) Screening Note   Patient Details  Name: Dustin Tucker Date of Birth: Oct 20, 1931   Transition of Care [facility]) CM/SW Contact:    Salome Arnt, [place] Phone Number: 06/18/2022, 10:40 AM    Transition of Care Department [facility]) has reviewed patient and no TOC needs have been identified at this time. We will continue to monitor patient advancement through interdisciplinary progression rounds. If new patient transition needs arise, please place a TOC consult.             Expected Discharge Plan and Services                                                 Social Determinants of Health (SDOH) Interventions    Readmission Risk Interventions     No data to display

## 2022-06-18 NOTE — Progress Notes (Signed)
Patient seen And examined; admitted after midnight secondary to generalized weakness/general malaise and shortness of breath.  Work-up in the ED suggestive of UTI and dehydration.  Given underlying history of CML, along with his complaints of shortness of breath, tachycardia and elevated D-dimer VQ scan has been ordered and is currently pending.  Patient with underlying thrombocytopenia making unsafe the use of heparin products.  Stable vital signs.  Please refer to H&P written by Dr. Clearence Ped on 06/18/22 for further info/details on admission.  Plan: -Continue current antibiotics and follow culture results. -Maintain adequate hydration and provide supportive care -Holding heparin products in the setting of platelet count int he 19 K range. -Follow-up VQ scan results.  Barton Dubois MD [phone]

## 2022-06-18 NOTE — Assessment & Plan Note (Signed)
-  Stable vital signs: -Continue to follow blood pressure -Continue holding antihypertensive agents.

## 2022-06-18 NOTE — Assessment & Plan Note (Signed)
-   UA is indicative of UTI - Continue Rocephin - His urine culture has grown Klebsiella that is sensitive to Rocephin - Updated urine culture pending - Blood cultures pending - Continue to monitor

## 2022-06-18 NOTE — Assessment & Plan Note (Signed)
-   Likely secondary to poor p.o. intake - Replace and recheck - Monitor on telemetry

## 2022-06-19 DIAGNOSIS — E876 Hypokalemia: Secondary | ICD-10-CM

## 2022-06-19 DIAGNOSIS — Z79899 Other long term (current) drug therapy: Secondary | ICD-10-CM | POA: Diagnosis not present

## 2022-06-19 DIAGNOSIS — I129 Hypertensive chronic kidney disease with stage 1 through stage 4 chronic kidney disease, or unspecified chronic kidney disease: Secondary | ICD-10-CM | POA: Diagnosis present

## 2022-06-19 DIAGNOSIS — Z20822 Contact with and (suspected) exposure to covid-19: Secondary | ICD-10-CM | POA: Diagnosis present

## 2022-06-19 DIAGNOSIS — R531 Weakness: Secondary | ICD-10-CM

## 2022-06-19 DIAGNOSIS — E860 Dehydration: Secondary | ICD-10-CM | POA: Diagnosis present

## 2022-06-19 DIAGNOSIS — N390 Urinary tract infection, site not specified: Secondary | ICD-10-CM | POA: Diagnosis present

## 2022-06-19 DIAGNOSIS — E440 Moderate protein-calorie malnutrition: Secondary | ICD-10-CM | POA: Diagnosis present

## 2022-06-19 DIAGNOSIS — R300 Dysuria: Secondary | ICD-10-CM | POA: Diagnosis present

## 2022-06-19 DIAGNOSIS — K5900 Constipation, unspecified: Secondary | ICD-10-CM | POA: Diagnosis present

## 2022-06-19 DIAGNOSIS — C9210 Chronic myeloid leukemia, BCR/ABL-positive, not having achieved remission: Secondary | ICD-10-CM

## 2022-06-19 DIAGNOSIS — Z87891 Personal history of nicotine dependence: Secondary | ICD-10-CM | POA: Diagnosis not present

## 2022-06-19 DIAGNOSIS — D696 Thrombocytopenia, unspecified: Secondary | ICD-10-CM | POA: Diagnosis present

## 2022-06-19 DIAGNOSIS — Z886 Allergy status to analgesic agent status: Secondary | ICD-10-CM | POA: Diagnosis not present

## 2022-06-19 DIAGNOSIS — B961 Klebsiella pneumoniae [K. pneumoniae] as the cause of diseases classified elsewhere: Secondary | ICD-10-CM | POA: Diagnosis present

## 2022-06-19 DIAGNOSIS — N1832 Chronic kidney disease, stage 3b: Secondary | ICD-10-CM

## 2022-06-19 DIAGNOSIS — Z8249 Family history of ischemic heart disease and other diseases of the circulatory system: Secondary | ICD-10-CM | POA: Diagnosis not present

## 2022-06-19 DIAGNOSIS — Z888 Allergy status to other drugs, medicaments and biological substances status: Secondary | ICD-10-CM | POA: Diagnosis not present

## 2022-06-19 DIAGNOSIS — N3000 Acute cystitis without hematuria: Secondary | ICD-10-CM | POA: Diagnosis not present

## 2022-06-19 DIAGNOSIS — D638 Anemia in other chronic diseases classified elsewhere: Secondary | ICD-10-CM | POA: Diagnosis present

## 2022-06-19 DIAGNOSIS — K219 Gastro-esophageal reflux disease without esophagitis: Secondary | ICD-10-CM | POA: Diagnosis present

## 2022-06-19 DIAGNOSIS — D649 Anemia, unspecified: Secondary | ICD-10-CM | POA: Diagnosis not present

## 2022-06-19 DIAGNOSIS — R140 Abdominal distension (gaseous): Secondary | ICD-10-CM | POA: Diagnosis present

## 2022-06-19 DIAGNOSIS — N1831 Chronic kidney disease, stage 3a: Secondary | ICD-10-CM | POA: Diagnosis present

## 2022-06-19 DIAGNOSIS — I10 Essential (primary) hypertension: Secondary | ICD-10-CM | POA: Diagnosis not present

## 2022-06-19 DIAGNOSIS — D6481 Anemia due to antineoplastic chemotherapy: Secondary | ICD-10-CM | POA: Diagnosis present

## 2022-06-19 DIAGNOSIS — Z89022 Acquired absence of left finger(s): Secondary | ICD-10-CM | POA: Diagnosis not present

## 2022-06-19 DIAGNOSIS — H5462 Unqualified visual loss, left eye, normal vision right eye: Secondary | ICD-10-CM | POA: Diagnosis present

## 2022-06-19 DIAGNOSIS — E872 Acidosis, unspecified: Secondary | ICD-10-CM | POA: Diagnosis present

## 2022-06-19 NOTE — Progress Notes (Signed)
Progress Note   Patient: Dustin Tucker:[id number] DOB: 21-Mar-1932 DOA: 06/17/2022     0 DOS: the patient was seen and examined on 06/19/2022   Brief hospital course: As per H&P written by Dr. Clearence Ped On 06/18/2022 Dustin Tucker is a 88 y.o. male with medical history significant of hypertension, GERD, CML, and more presents to ED with chief complaint of fatigue, malaise, myalgias, dyspnea.  Patient reports this all started approximately 24 hours prior to presentation.  Patient reports the myalgias have been the most bothersome.  He has not had any cough.  His dyspnea started at the same time as the myalgias.  It is not worse on exertion.  He has no associated chest pain, no associated fever.  Patient reports he feels dehydrated.  He denies palpitations and near syncope.  He reports that he does have generalized weakness.  He has had no appetite.  He has had an unknown amount of weight loss.  Patient reports that at first he attributed his appetite to chemo, but he has not had a chemo treatment in a month because of the weight loss per his report. He has had some dysuria but he is not sure when it started.  He denies any hematuria.  He does have associated concentration of his urine per his report it is darker in color.  Patient has no other complaints at this time.   Patient does not smoke, does not drink, does not use illicit drugs.  He is vaccinated for COVID.  Patient is full code.   Assessment and Plan: * UTI (urinary tract infection) - UA indicative of UTI at time of admission -Patient also reporting suprapubic tenderness and increased frequency. -Continue empirical management with Rocephin and follow culture result -Maintain adequate hydration -Continue supportive care and follow clinical improvement. -Previous blood culture in his records demonstrating Klebsiella microorganism sensitive to Rocephin.  Elevated d-dimer - D-dimer 4.83 - -Currently denying chest pain or shortness  of breath -VQ scan negative for pulmonary embolism. -Continue maintaining adequate hydration and follow clinical response.   Dehydration - Secondary to poor p.o. intake - Significant abnormality in patient's electrolytes profile including metabolic acidosis with low bicarbonate, hyponatremia and hypokalemia. -Continue to maintain adequate hydration -Continue electrolyte repletion and follow trend.   -Renal function/creatinine close to baseline.    Hypokalemia - Likely secondary to poor p.o. intake - Continue repletion and follow electrolytes trend. -Continue telemetry monitoring.  GERD (gastroesophageal reflux disease) - Continue Protonix -Patient denies nausea/vomiting and currently no reflux symptoms reported.  Chronic anemia - Hemoglobin currently 7.9; most likely due to hemodilution. - no overt bleeding appreciated -Most likely anemia of chronic disease and induced by chemotherapy -Continue to follow hemoglobin trend -Transfuse for hemoglobin less than 7.    SIRS (systemic inflammatory response syndrome) (HCC) - Heart rate 126, respiratory rate around 26-34 at time of admission; and most likely driven by dehydration process.   -Patient WBC is elevated in the setting of leukemia; despite presence of UTI patient's dehydration is most likely responsible for tachycardia. - No evidence of endorgan damage - Continue to treat for UTI -Sepsis has not rule in.  Essential hypertension - Stable vital signs: -Continue to follow blood pressure -Continue holding antihypertensive agents.   Subjective:  Chronically ill in appearance; appears severely underweight.  No chest pain, no nausea, no vomiting.  Reporting suprapubic tenderness and general malaise.  Expressed breathing has overall improved and expressed no fever or chills.  Physical Exam: Vitals:  06/18/22 1212 06/18/22 2012 06/19/22 0336 06/19/22 1333  BP: 125/62 (!) 127/56 138/68 (!) 150/71  Pulse: 85 87 75 95  Resp:  '17 16 18 18  '$ Temp: 97.9 F (36.6 C) (!) 100.5 F (38.1 C) 99.3 F (37.4 C) 98.1 F (36.7 C)  TempSrc:  Oral Oral Oral  SpO2: 98% 99% 97%   Weight:      Height:       General exam: Alert, awake, oriented x 3; chronically ill in appearance; reporting improvement in his breathing and denying chest pain.  Still complaining of intermittent abdominal discomfort and some suprapubic tenderness. Respiratory system: Good saturation on room air; no wheezing or crackles appreciated on exam.  Positive scattered rhonchi. Cardiovascular system:RRR. No rubs or gallops. Gastrointestinal system: Abdomen is nondistended, soft and nontender. No organomegaly or masses felt. Normal bowel sounds heard. Central nervous system: Alert and oriented. No focal neurological deficits. Extremities: No cyanosis or clubbing. Skin: No petechiae. Psychiatry: Judgement and insight appear normal. Mood & affect appropriate.   Data Reviewed: CBC: wbc 46K, hemoglobin 7.9 and platelet count 19 K VQ scan: Negative for pulmonary embolism. Comprehensive metabolic panel: Sodium 837, potassium 3.4, chloride 119, bicarb 18, BUN 22 and creatinine 1.44, AST 83, ALT 59 and GFR 47.  Family Communication: no family at bedside  Disposition: Status is: Inpatient Remains inpatient appropriate because: continue IV antibiotics.   Planned Discharge Destination: Home   Time spent: 35 minutes  Author: Barton Dubois, MD 06/19/2022 3:25 PM  For on call review [email or web address].

## 2022-06-20 DIAGNOSIS — N3000 Acute cystitis without hematuria: Secondary | ICD-10-CM | POA: Diagnosis not present

## 2022-06-20 DIAGNOSIS — D649 Anemia, unspecified: Secondary | ICD-10-CM | POA: Diagnosis not present

## 2022-06-20 DIAGNOSIS — C9210 Chronic myeloid leukemia, BCR/ABL-positive, not having achieved remission: Secondary | ICD-10-CM | POA: Diagnosis not present

## 2022-06-20 DIAGNOSIS — R531 Weakness: Secondary | ICD-10-CM | POA: Diagnosis not present

## 2022-06-20 LAB — CBC
HCT: 24.9 % — ABNORMAL LOW (ref 39.0–52.0)
Hemoglobin: 8.3 g/dL — ABNORMAL LOW (ref 13.0–17.0)
MCH: 30 pg (ref 26.0–34.0)
MCHC: 33.3 g/dL (ref 30.0–36.0)
MCV: 89.9 fL (ref 80.0–100.0)
Platelets: 13 10*3/uL — CL (ref 150–400)
RBC: 2.77 MIL/uL — ABNORMAL LOW (ref 4.22–5.81)
RDW: 16.5 % — ABNORMAL HIGH (ref 11.5–15.5)
WBC: 47.2 10*3/uL — ABNORMAL HIGH (ref 4.0–10.5)
nRBC: 0 % (ref 0.0–0.2)

## 2022-06-20 LAB — URINE CULTURE: Culture: >=100000 — AB

## 2022-06-20 LAB — CULTURE, BLOOD (ROUTINE X 2)

## 2022-06-20 LAB — BASIC METABOLIC PANEL
Anion gap: 6 (ref 5–15)
BUN: 22 mg/dL (ref 8–23)
CO2: 19 mmol/L — ABNORMAL LOW (ref 22–32)
Calcium: 8.2 mg/dL — ABNORMAL LOW (ref 8.9–10.3)
Chloride: 117 mmol/L — ABNORMAL HIGH (ref 98–111)
Creatinine, Ser: 1.3 mg/dL — ABNORMAL HIGH (ref 0.61–1.24)
GFR, Estimated: 53 mL/min — ABNORMAL LOW (ref >60)
Glucose, Bld: 104 mg/dL — ABNORMAL HIGH (ref 70–99)
Potassium: 3 mmol/L — ABNORMAL LOW (ref 3.5–5.1)
Sodium: 142 mmol/L (ref 135–145)

## 2022-06-20 MED ORDER — MAGNESIUM SULFATE IN D5W 1-5 GM/100ML-% IV SOLN
1.0000 g | Freq: Once | INTRAVENOUS | Status: AC
  Administered 2022-06-20: 1 g via INTRAVENOUS
  Filled 2022-06-20: qty 100

## 2022-06-20 MED ORDER — SIMETHICONE 80 MG PO CHEW
80.0000 mg | CHEWABLE_TABLET | Freq: Four times a day (QID) | ORAL | Status: DC | PRN
  Filled 2022-06-20 (×2): qty 1

## 2022-06-20 MED ORDER — POTASSIUM CHLORIDE 20 MEQ PO PACK
40.0000 meq | PACK | Freq: Once | ORAL | Status: AC
  Administered 2022-06-20: 40 meq via ORAL
  Filled 2022-06-20: qty 2

## 2022-06-20 MED ORDER — POTASSIUM CHLORIDE CRYS ER 20 MEQ PO TBCR
40.0000 meq | EXTENDED_RELEASE_TABLET | Freq: Once | ORAL | Status: AC
  Administered 2022-06-20: 40 meq via ORAL
  Filled 2022-06-20: qty 2

## 2022-06-20 MED ORDER — SODIUM CHLORIDE 0.9% IV SOLUTION: Freq: Once | INTRAVENOUS | Status: AC

## 2022-06-20 MED ORDER — DOCUSATE SODIUM 100 MG PO CAPS
100.0000 mg | ORAL_CAPSULE | Freq: Two times a day (BID) | ORAL | Status: DC
  Administered 2022-06-20 – 2022-06-23 (×6): 100 mg via ORAL
  Filled 2022-06-20 (×6): qty 1

## 2022-06-20 MED ORDER — POLYETHYLENE GLYCOL 3350 17 G PO PACK
17.0000 g | PACK | Freq: Every day | ORAL | Status: DC
  Administered 2022-06-20 – 2022-06-23 (×4): 17 g via ORAL
  Filled 2022-06-20 (×4): qty 1

## 2022-06-20 NOTE — Progress Notes (Signed)
Patient has taken all of his medications orally without any complications. He is currently receiving platelets with no distress. Vitals are within normal limits and he is resting comfortably.

## 2022-06-20 NOTE — Progress Notes (Signed)
Progress Note   Patient: Dustin Tucker DOB: 1931-12-10 DOA: 06/17/2022     1 DOS: the patient was seen and examined on 06/20/2022   Brief hospital course: As per H&P written by Dr. Clearence Ped On 06/18/2022 Dustin Tucker is a 88 y.o. male with medical history significant of hypertension, GERD, CML, and more presents to ED with chief complaint of fatigue, malaise, myalgias, dyspnea.  Patient reports this all started approximately 24 hours prior to presentation.  Patient reports the myalgias have been the most bothersome.  He has not had any cough.  His dyspnea started at the same time as the myalgias.  It is not worse on exertion.  He has no associated chest pain, no associated fever.  Patient reports he feels dehydrated.  He denies palpitations and near syncope.  He reports that he does have generalized weakness.  He has had no appetite.  He has had an unknown amount of weight loss.  Patient reports that at first he attributed his appetite to chemo, but he has not had a chemo treatment in a month because of the weight loss per his report. He has had some dysuria but he is not sure when it started.  He denies any hematuria.  He does have associated concentration of his urine per his report it is darker in color.  Patient has no other complaints at this time.   Patient does not smoke, does not drink, does not use illicit drugs.  He is vaccinated for COVID.  Patient is full code.   Assessment and Plan: * UTI (urinary tract infection) - UA indicative of UTI at time of admission -Cultures demonstrating more than 100,000 colonies of Klebsiella pneumoniae; sensitivity pending. -Patient also reporting suprapubic tenderness and increased frequency. -Continue empirical management with Rocephin and follow culture result -Maintain adequate hydration -Continue supportive care and follow clinical improvement.   Elevated d-dimer - D-dimer 4.83 - -Currently denying chest pain or shortness  of breath -VQ scan negative for pulmonary embolism. -Continue maintaining adequate hydration and follow clinical response.   Dehydration - Secondary to poor p.o. intake - Significant abnormality in patient's electrolytes profile including metabolic acidosis with low bicarbonate, hyponatremia and hypokalemia. -Continue to maintain adequate hydration -Continue electrolyte repletion and follow trend.   -Renal function/creatinine close to baseline after fluid resuscitation provided..    Hypokalemia - Likely secondary to poor p.o. intake - Continue repletion and follow electrolytes trend. -Magnesium has also been repleted and will follow stability.  GERD (gastroesophageal reflux disease) -Continue Protonix -Patient denies nausea/vomiting and currently no reflux symptoms reported.  Chronic anemia - Hemoglobin currently 8.3; most likely due to hemodilution. - no overt bleeding appreciated -Most likely anemia of chronic disease and induced by chemotherapy -Continue to follow hemoglobin trend -Transfuse for hemoglobin less than 7.    SIRS (systemic inflammatory response syndrome) (HCC) -Heart rate 126, respiratory rate around 26-34 at time of admission; and most likely driven by dehydration process.   -Patient WBC is elevated in the setting of leukemia; despite presence of UTI patient's dehydration is most likely responsible for tachycardia. -Continue to treat for UTI -Sepsis has not rule in given presentation and explanation for underlying abnormalities..  Essential hypertension -Stable vital signs: -Continue to follow blood pressure -Continue holding antihypertensive agents.  Thrombocytopenia: -Platelet count 13 K -In the setting of chemotherapeutic agents and underlying CML -Case discussed with oncology service who has recommended transfusion of 1 unit of platelets -Follow trend/stability  Subjective:  Chronically ill in  appearance, appears severely underweight and expressing  no feeling good today.  Reports abdominal discomfort, intermittent coughing spells, decreased appetite, increased frequency and general malaise.  Physical Exam: Vitals:   06/20/22 1500 06/20/22 1515 06/20/22 1517 06/20/22 1714  BP: (!) 135/55 129/65 129/65 (!) 140/77  Pulse: 82 78 78 81  Resp: '17 18 18 20  '$ Temp: 97.8 F (36.6 C) (!) 97.5 F (36.4 C) (!) 97.5 F (36.4 C) 97.6 F (36.4 C)  TempSrc: Oral Oral Oral Oral  SpO2: 98% 99% 99% 98%  Weight:      Height:       General exam: Alert, awake, oriented x 3; generally weak and deconditioned.  Chronically ill in appearance.  No fever, reports no nausea, no vomiting.  Decreased oral intake and no bowel movements in the last 3 days or so. Respiratory system: Clear to auscultation. Respiratory effort normal.  Good saturation on room air.  Positive scattered rhonchi. Cardiovascular system:RRR. No rubs or gallops. Gastrointestinal system: Abdomen is slightly distended, mild guarding on examination.  Positive bowel sounds.  No masses.  Sounds heard. Central nervous system: Alert and oriented. No focal neurological deficits. Extremities: No cyanosis or clubbing. Skin: No petechiae. Psychiatry: Judgement and insight appear normal. Mood & affect appropriate.   Data Reviewed: CBC: wbc 47.2, hemoglobin 8.3 and platelet count 13 K Basic metabolic panel: Sodium 366, potassium 3.0, chloride 117, bicarb 19, BUN 22, creatinine 1.3 and GFR 53  Family Communication: no family at bedside  Disposition: Status is: Inpatient Remains inpatient appropriate because: continue IV antibiotics.   Planned Discharge Destination: Home    Time spent: 35 minutes  Author: Barton Dubois, MD 06/20/2022 5:43 PM  For on call review [email or web address].

## 2022-06-20 NOTE — Plan of Care (Signed)
  Problem: Pain Managment: Goal: General experience of comfort will improve Outcome: Not Progressing   Problem: Safety: Goal: Ability to remain free from injury will improve Outcome: Not Progressing   Problem: Skin Integrity: Goal: Risk for impaired skin integrity will decrease Outcome: Not Progressing

## 2022-06-21 ENCOUNTER — Other Ambulatory Visit: Payer: Medicare HMO

## 2022-06-21 ENCOUNTER — Ambulatory Visit: Payer: Medicare HMO

## 2022-06-21 ENCOUNTER — Ambulatory Visit: Payer: Medicare HMO | Admitting: Hematology

## 2022-06-21 ENCOUNTER — Encounter (HOSPITAL_COMMUNITY): Payer: Self-pay | Admitting: Hematology

## 2022-06-21 ENCOUNTER — Inpatient Hospital Stay (HOSPITAL_COMMUNITY): Payer: Medicare HMO

## 2022-06-21 DIAGNOSIS — D649 Anemia, unspecified: Secondary | ICD-10-CM | POA: Diagnosis not present

## 2022-06-21 DIAGNOSIS — C9210 Chronic myeloid leukemia, BCR/ABL-positive, not having achieved remission: Secondary | ICD-10-CM | POA: Diagnosis not present

## 2022-06-21 DIAGNOSIS — N3000 Acute cystitis without hematuria: Secondary | ICD-10-CM | POA: Diagnosis not present

## 2022-06-21 DIAGNOSIS — R531 Weakness: Secondary | ICD-10-CM | POA: Diagnosis not present

## 2022-06-21 LAB — CBC
HCT: 27.1 % — ABNORMAL LOW (ref 39.0–52.0)
Hemoglobin: 9 g/dL — ABNORMAL LOW (ref 13.0–17.0)
MCH: 29.9 pg (ref 26.0–34.0)
MCHC: 33.2 g/dL (ref 30.0–36.0)
MCV: 90 fL (ref 80.0–100.0)
Platelets: 25 10*3/uL — CL (ref 150–400)
RBC: 3.01 MIL/uL — ABNORMAL LOW (ref 4.22–5.81)
RDW: 17 % — ABNORMAL HIGH (ref 11.5–15.5)
WBC: 67.6 10*3/uL (ref 4.0–10.5)
nRBC: 0 % (ref 0.0–0.2)

## 2022-06-21 LAB — BASIC METABOLIC PANEL
Anion gap: 7 (ref 5–15)
BUN: 27 mg/dL — ABNORMAL HIGH (ref 8–23)
CO2: 18 mmol/L — ABNORMAL LOW (ref 22–32)
Calcium: 8.4 mg/dL — ABNORMAL LOW (ref 8.9–10.3)
Chloride: 117 mmol/L — ABNORMAL HIGH (ref 98–111)
Creatinine, Ser: 1.5 mg/dL — ABNORMAL HIGH (ref 0.61–1.24)
GFR, Estimated: 45 mL/min — ABNORMAL LOW (ref >60)
Glucose, Bld: 118 mg/dL — ABNORMAL HIGH (ref 70–99)
Potassium: 3.7 mmol/L (ref 3.5–5.1)
Sodium: 142 mmol/L (ref 135–145)

## 2022-06-21 LAB — MAGNESIUM: Magnesium: 2.5 mg/dL — ABNORMAL HIGH (ref 1.7–2.4)

## 2022-06-21 MED ORDER — ADULT MULTIVITAMIN W/MINERALS CH
1.0000 | ORAL_TABLET | Freq: Every day | ORAL | Status: DC
  Administered 2022-06-21 – 2022-06-23 (×3): 1 via ORAL
  Filled 2022-06-21 (×3): qty 1

## 2022-06-21 MED ORDER — ENSURE ENLIVE PO LIQD
237.0000 mL | Freq: Two times a day (BID) | ORAL | Status: DC
  Administered 2022-06-21 – 2022-06-22 (×2): 237 mL via ORAL

## 2022-06-21 NOTE — NC FL2 (Signed)
[place] LEVEL OF CARE SCREENING TOOL     IDENTIFICATION  Patient Name: Dustin Tucker Birthdate: 06-28-1932 Sex: male Admission Date (Current Location): 06/17/2022  [facility] and [place] Number:  [employer] and Address:  [place] [address] [place]      Provider Number: [id number]  Attending Physician Name and Address:  Barton Dubois, MD  Relative Name and Phone Number:       Current Level of Care: Hospital Recommended Level of Care: [place] Prior Approval Number:    Date Approved/Denied:   PASRR Number: [id number]  Discharge Plan: SNF    Current Diagnoses: Patient Active Problem List   Diagnosis Date Noted   SIRS (systemic inflammatory response syndrome) ([place]) 06/18/2022   Chronic anemia 06/18/2022   GERD (gastroesophageal reflux disease) 06/18/2022   Hypokalemia 06/18/2022   Dehydration 06/18/2022   Elevated d-dimer 06/18/2022   UTI (urinary tract infection) 06/17/2022   Myelodysplastic syndrome ([place])    Port-A-Cath in place 10/15/2021   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   Chronic myelomonocytic leukemia not having achieved remission ([place]) 10/01/2021   Tachy-brady syndrome ([place]) 09/20/2018   Nausea and vomiting 09/20/2018   Essential hypertension 09/20/2018   Atrial fibrillation with rapid ventricular response (HCC)    PSVT (paroxysmal supraventricular tachycardia) ([place]) 03/24/2015   Near syncope 12/13/2014    Orientation RESPIRATION BLADDER Height & Weight     Self, Time, Situation, Place  Normal Continent Weight: 148 lb 9.4 oz (67.4 kg) Height:  '5\' 5"'$  (165.1 cm)  BEHAVIORAL SYMPTOMS/MOOD NEUROLOGICAL BOWEL NUTRITION STATUS      Continent Diet (see dc summary)  AMBULATORY STATUS COMMUNICATION OF NEEDS Skin   Extensive Assist Verbally Normal                       Personal Care Assistance Level of Assistance  Bathing, Feeding, Dressing Bathing  Assistance: Limited assistance Feeding assistance: Independent Dressing Assistance: Limited assistance     Functional Limitations Info  Sight, Hearing, Speech Sight Info: Impaired Hearing Info: Adequate Speech Info: Adequate    SPECIAL CARE FACTORS FREQUENCY  PT (By licensed PT), OT (By licensed OT)     PT Frequency: 5x week OT Frequency: 3x week            Contractures Contractures Info: Not present    Additional Factors Info  Code Status, Allergies Code Status Info: Full Allergies Info: Aspirin, Flomax           Current Medications (06/21/2022):  This is the current hospital active medication list Current Facility-Administered Medications  Medication Dose Route Frequency Provider Last Rate Last Admin   acetaminophen (TYLENOL) tablet 650 mg  650 mg Oral Q6H PRN Zierle-Ghosh, Asia B, DO   650 mg at 06/20/22 0920   Or   acetaminophen (TYLENOL) suppository 650 mg  650 mg Rectal Q6H PRN Zierle-Ghosh, Asia B, DO       albuterol (PROVENTIL) (2.5 MG/3ML) 0.083% nebulizer solution 2.5 mg  2.5 mg Nebulization Q2H PRN Zierle-Ghosh, Asia B, DO       cefTRIAXone (ROCEPHIN) 2 g in sodium chloride 0.9 % 100 mL IVPB  2 g Intravenous Q24H Zierle-Ghosh, Asia B, DO   Stopped at 06/20/22 2028   docusate sodium (COLACE) capsule 100 mg  100 mg Oral BID Barton Dubois, MD   100 mg at 06/21/22 0839   feeding supplement (ENSURE ENLIVE / ENSURE PLUS) liquid 237 mL  237  mL Oral BID BM Barton Dubois, MD   237 mL at 06/21/22 1408   morphine (PF) 2 MG/ML injection 2 mg  2 mg Intravenous Q2H PRN Zierle-Ghosh, Asia B, DO   2 mg at 06/21/22 0529   multivitamin with minerals tablet 1 tablet  1 tablet Oral Daily Barton Dubois, MD   1 tablet at 06/21/22 1408   ondansetron (ZOFRAN) tablet 4 mg  4 mg Oral Q6H PRN Zierle-Ghosh, Asia B, DO       Or   ondansetron (ZOFRAN) injection 4 mg  4 mg Intravenous Q6H PRN Zierle-Ghosh, Asia B, DO       oxyCODONE (Oxy IR/ROXICODONE) immediate release tablet 5 mg  5  mg Oral Q4H PRN Zierle-Ghosh, Asia B, DO   5 mg at 06/21/22 0841   pantoprazole (PROTONIX) EC tablet 40 mg  40 mg Oral Daily Zierle-Ghosh, Asia B, DO   40 mg at 06/21/22 0839   polyethylene glycol (MIRALAX / GLYCOLAX) packet 17 g  17 g Oral Daily Barton Dubois, MD   17 g at 06/21/22 3016   simethicone (MYLICON) chewable tablet 80 mg  80 mg Oral Q6H PRN Barton Dubois, MD       Facility-Administered Medications Ordered in Other Encounters  Medication Dose Route Frequency Provider Last Rate Last Admin   0.9 %  sodium chloride infusion (Manually program via Guardrails IV Fluids)  250 mL Intravenous Once Derek Jack, MD       acetaminophen (TYLENOL) 325 MG tablet            diphenhydrAMINE (BENADRYL) 25 mg capsule            palonosetron (ALOXI) 0.25 MG/5ML injection              Discharge Medications: Please see discharge summary for a list of discharge medications.  Relevant Imaging Results:  Relevant Lab Results:   Additional Information SSN: [id number], [place]

## 2022-06-21 NOTE — Plan of Care (Signed)
  Problem: Activity: Goal: Risk for activity intolerance will decrease Outcome: Not Progressing   Problem: Elimination: Goal: Will not experience complications related to bowel motility Outcome: Not Progressing   Problem: Pain Managment: Goal: General experience of comfort will improve Outcome: Not Progressing

## 2022-06-21 NOTE — Progress Notes (Addendum)
Initial Nutrition Assessment  DOCUMENTATION CODES:   Non-severe (moderate) malnutrition in context of chronic illness  INTERVENTION:   Ensure Enlive po BID, each supplement provides 350 kcal and 20 grams of protein.   NUTRITION DIAGNOSIS:   Moderate Malnutrition related to chronic illness (chronic myelomonocytic leukemia (CML)) as evidenced by mild fat depletion, mild muscle depletion.   GOAL:  Patient will meet greater than or equal to 90% of their needs   MONITOR:  PO intake, Supplement acceptance, Labs, Weight trends  REASON FOR ASSESSMENT:   Malnutrition Screening Tool    ASSESSMENT: patient is a 88 yo male with hx of HTN, GERD, chronic myelomonocytic leukemia (CML) who presents with dyspnea, weakness,  Hemoglobin 6.8 (received 2 units blood). UTI on admission, dehydration, SIRS.   11/7-CT guided bone marrow biopsy  Heart Healthy diet- PO: between 25-75% x 8 meals. Feeding himself at home. Patient up in chair. Reports that he is not feeling well (groaning). Patient says he was eating pretty good up until yesterday. Constipation and poor appetite per chart. Patient agreeable to drink high protein/high calorie ONS while appetite is poor.   Weights ranging 67-74 kg since early August. 9/25/ weight of 73.9 kg appears to be an outlier.   Medications reviewed and include: colace, protonix, miralax.      Latest Ref Rng & Units 06/21/2022    4:51 AM 06/20/2022    5:15 AM 06/18/2022    3:43 AM  BMP  Glucose 70 - 99 mg/dL 118  104  109   BUN 8 - 23 mg/dL _0 Creatinine 0.61 - 1.24 mg/dL 1.50  1.30  1.44   Sodium 135 - 145 mmol/L 142  142  144   Potassium 3.5 - 5.1 mmol/L 3.7  3.0  3.4   Chloride 98 - 111 mmol/L 117  117  119   CO2 22 - 32 mmol/L _1 Calcium 8.9 - 10.3 mg/dL 8.4  8.2  8.3       NUTRITION - FOCUSED PHYSICAL EXAM:  Flowsheet Row Most Recent Value  Orbital Region No depletion  Upper Arm Region Mild depletion  Buccal Region Mild  depletion  Temple Region Moderate depletion  Clavicle Bone Region Moderate depletion  Clavicle and Acromion Bone Region Moderate depletion  Dorsal Hand Severe depletion  Patellar Region Mild depletion  Anterior Thigh Region Mild depletion  Posterior Calf Region No depletion  Edema (RD Assessment) None  Hair Reviewed  Eyes Reviewed  Mouth Reviewed  Skin Reviewed  Nails Reviewed       Diet Order:   Diet Order             Diet Heart Room service appropriate? Yes; Fluid consistency: Thin  Diet effective now                   EDUCATION NEEDS:  Education needs have been addressed  Skin:  Skin Assessment: Reviewed RN Assessment  Last BM:  11/9  Height:   Ht Readings from Last 1 Encounters:  06/17/22 _2  (1.651 m)    Weight:   Wt Readings from Last 1 Encounters:  06/17/22 67.4 kg    Ideal Body Weight:   62 kg  BMI:  Body mass index is 24.73 kg/m.  Estimated Nutritional Needs:   Kcal:  1900-2000  Protein:  88-95 gr  Fluid:  2 liters daily  Colman Cater MS,RD,CSG,LDN Contact: Shea Evans

## 2022-06-21 NOTE — Progress Notes (Signed)
Progress Note   Patient: Dustin Tucker IOX:[id number] DOB: 01-01-1932 DOA: 06/17/2022     2 DOS: the patient was seen and examined on 06/21/2022   Brief hospital course: As per H&P written by Dr. Clearence Ped On 06/18/2022 Dustin Tucker is a 88 y.o. male with medical history significant of hypertension, GERD, CML, and more presents to ED with chief complaint of fatigue, malaise, myalgias, dyspnea.  Patient reports this all started approximately 24 hours prior to presentation.  Patient reports the myalgias have been the most bothersome.  He has not had any cough.  His dyspnea started at the same time as the myalgias.  It is not worse on exertion.  He has no associated chest pain, no associated fever.  Patient reports he feels dehydrated.  He denies palpitations and near syncope.  He reports that he does have generalized weakness.  He has had no appetite.  He has had an unknown amount of weight loss.  Patient reports that at first he attributed his appetite to chemo, but he has not had a chemo treatment in a month because of the weight loss per his report. He has had some dysuria but he is not sure when it started.  He denies any hematuria.  He does have associated concentration of his urine per his report it is darker in color.  Patient has no other complaints at this time.   Patient does not smoke, does not drink, does not use illicit drugs.  He is vaccinated for COVID.  Patient is full code.   Assessment and Plan: * UTI (urinary tract infection) - UA indicative of UTI at time of admission -Cultures demonstrating more than 100,000 colonies of Klebsiella pneumoniae; microorganism is resistant partially to nitrofurantoin and ampicillin. -for now Given limitation with oral intake, continue IV rocephin. -Patient also reporting suprapubic tenderness and abd distension. -Maintain adequate hydration -Continue supportive care and follow clinical improvement.   Elevated d-dimer - D-dimer 4.83 -  -Currently denying chest pain or shortness of breath -VQ scan negative for pulmonary embolism. -Continue maintaining adequate hydration and follow clinical response.   Dehydration - Secondary to poor p.o. intake - Significant abnormality in patient's electrolytes profile including metabolic acidosis with low bicarbonate, hyponatremia and hypokalemia. -Continue to maintain adequate hydration -Continue electrolyte repletion and follow trend.   -Renal function/creatinine close to baseline after fluid resuscitation provided..    Hypokalemia - Likely secondary to poor p.o. intake - Continue repletion and follow electrolytes trend. -Magnesium has also been repleted and will follow stability.  GERD (gastroesophageal reflux disease) -Continue Protonix -Patient denies nausea/vomiting and currently no reflux symptoms reported.  Chronic anemia - Hemoglobin currently 8.3; most likely due to hemodilution. - no overt bleeding appreciated -Most likely anemia of chronic disease and induced by chemotherapy -Continue to follow hemoglobin trend -Transfuse for hemoglobin less than 7.    SIRS (systemic inflammatory response syndrome) (HCC) -Heart rate 126, respiratory rate around 26-34 at time of admission; and most likely driven by dehydration process.   -Patient WBC is elevated in the setting of leukemia; despite presence of UTI patient's dehydration is most likely responsible for tachycardia. -Continue to treat for UTI -Sepsis has not rule in given presentation and explanation for underlying abnormalities..  Essential hypertension -Stable vital signs: -Continue to follow blood pressure -Continue holding antihypertensive agents.  Thrombocytopenia: -Platelet count 25 K -In the setting of chemotherapeutic agents and underlying CML -Case discussed with oncology service, one unit of platelets transfused. -continue to Follow trend/stability  Abdominal distention/ascites -Ultrasound and  paracentesis has been ordered -Patient advised to maintain adequate hydration -Follow clinical response.    Subjective:  Patient reports passing gas but no bowel movement; suprapubic tenderness, abdominal distention, early satiety and generalized weakness/deconditioning.  He does not feel good today.  Physical Exam: Vitals:   06/20/22 1517 06/20/22 1714 06/20/22 2026 06/21/22 0429  BP: 129/65 (!) 140/77 (!) 140/67 (!) 162/76  Pulse: 78 81 93 95  Resp: 18 20 (!) 24 (!) 24  Temp: (!) 97.5 F (36.4 C) 97.6 F (36.4 C) 98.5 F (36.9 C) 98.3 F (36.8 C)  TempSrc: Oral Oral    SpO2: 99% 98% 97% 95%  Weight:      Height:       General exam: Alert, awake, oriented x 3; generally weak and deconditioned; reports feeling terrible.  Still no bowel movements, passing gas but having abdominal discomfort and distention. Respiratory system: Clear to auscultation. Respiratory effort normal.  No using accessory muscles. Cardiovascular system:RRR. No rubs or gallops. Gastrointestinal system: Abdomen is distended and mildly tense on exam; positive fluid wave on examination.  Decreased but present bowel sounds appreciated.  Suprapubic tenderness on deep palpation appreciated on exam.   Central nervous system: Alert and oriented. No focal neurological deficits. Extremities: No cyanosis, clubbing or edema. Skin: No petechiae. Psychiatry: Judgement and insight appear normal.  Flat affect appreciated on exam.     Data Reviewed: CBC: wbc 7.6, hemoglobin 9.0 and platelet count 12,751 [place] panel: Sodium 700, potassium 3.7, chloride 117, bicarb 18, BUN 27, creatinine 1.5 and GFR 45. Magnesium: 2.5.  Family Communication: no family at bedside  Disposition: Status is: Inpatient Remains inpatient appropriate because: continue IV antibiotics.   Planned Discharge Destination: Home   Time spent: 35 minutes  Author: Barton Dubois, MD 06/21/2022 11:33 AM  For on call review [email or web address].

## 2022-06-21 NOTE — Evaluation (Signed)
Physical Therapy Evaluation Patient Details Name: Dustin Tucker MRN: [id number] DOB: May 24, 1932 Today's Date: 06/21/2022  History of Present Illness  As per H&P written by Dr. Clearence Ped On 06/18/2022  Dustin Tucker is a 88 y.o. male with medical history significant of hypertension, GERD, CML, and more presents to ED with chief complaint of fatigue, malaise, myalgias, dyspnea.  Patient reports this all started approximately 24 hours prior to presentation.  Patient reports the myalgias have been the most bothersome.  He has not had any cough.  His dyspnea started at the same time as the myalgias.  It is not worse on exertion.  He has no associated chest pain, no associated fever.  Patient reports he feels dehydrated.  He denies palpitations and near syncope.  He reports that he does have generalized weakness.  He has had no appetite.  He has had an unknown amount of weight loss.  Patient reports that at first he attributed his appetite to chemo, but he has not had a chemo treatment in a month because of the weight loss per his report.  He has had some dysuria but he is not sure when it started.  He denies any hematuria.  He does have associated concentration of his urine per his report it is darker in color.  Patient has no other complaints at this time.   Clinical Impression  Patient presents in chair (assisted by nursing staff) and agreeable for therapy. Patient able to transfer to bed without AD, had to lean on chair armrest, safer with RW. Patient then able to ambulate to room door/hallway with min guard/RW with increased stability, demonstrating slowed labored cadence without loss of balance. Patient tolerated staying up in chair after therapy with SpO2 at 97% following activity. Patient will benefit from continued skilled physical therapy in hospital and recommended venue below to increase strength, balance, endurance for safe ADLs and gait.     Recommendations for follow up therapy are one  component of a multi-disciplinary discharge planning process, led by the attending physician.  Recommendations may be updated based on patient status, additional functional criteria and insurance authorization.  Follow Up Recommendations Skilled nursing-short term rehab (<3 hours/day) Can patient physically be transported by private vehicle: Yes    Assistance Recommended at Discharge Set up Supervision/Assistance  Patient can return home with the following  A little help with walking and/or transfers;A little help with bathing/dressing/bathroom;Assistance with cooking/housework;Help with stairs or ramp for entrance    Equipment Recommendations Rolling walker (2 wheels)  Recommendations for Other Services       Functional Status Assessment Patient has had a recent decline in their functional status and demonstrates the ability to make significant improvements in function in a reasonable and predictable amount of time.     Precautions / Restrictions Precautions Precautions: Fall Restrictions Weight Bearing Restrictions: No      Mobility  Bed Mobility Overal bed mobility: Modified Independent             General bed mobility comments: increased time    Transfers Overall transfer level: Needs assistance Equipment used: None Transfers: Sit to/from Stand, Bed to chair/wheelchair/BSC Sit to Stand: Modified independent (Device/Increase time)   Step pivot transfers: Supervision       General transfer comment: patient able to stand from chair and transfer to bed without AD, had to lean on chair armrest, safer with RW    Ambulation/Gait Ambulation/Gait assistance: Min guard Gait Distance (Feet): 20 Feet Assistive device: Rolling walker (2  wheels) Gait Pattern/deviations: Decreased step length - right, Decreased step length - left, Decreased stride length Gait velocity: decreased     General Gait Details: patient demonstrates slow labored cadence with RW, able to  ambulate to room door/hallway and back before having to sit due to fatigue, SpO2 97% following activity  Stairs            Wheelchair Mobility    Modified Rankin (Stroke Patients Only)       Balance Overall balance assessment: Needs assistance Sitting-balance support: No upper extremity supported, Feet supported Sitting balance-Leahy Scale: Good Sitting balance - Comments: seated EOB   Standing balance support: Bilateral upper extremity supported, During functional activity Standing balance-Leahy Scale: Fair Standing balance comment: fair/good with RW                             Pertinent Vitals/Pain Pain Assessment Pain Assessment: Faces Faces Pain Scale: Hurts a little bit Pain Location: abdomen Pain Intervention(s): Limited activity within patient's tolerance, Monitored during session, Repositioned    Home Living Family/patient expects to be discharged to:: Private residence Living Arrangements: Alone Available Help at Discharge: Family;Available PRN/intermittently Type of Home: House Home Access: Stairs to enter Entrance Stairs-Rails: None Entrance Stairs-Number of Steps: 1   Home Layout: One level;Laundry or work area in basement;Other (Comment) Home Equipment: Cane - single point;Grab bars - tub/shower;Other (comment) Additional Comments: shower stool    Prior Function Prior Level of Function : Independent/Modified Independent;Driving             Mobility Comments: Community ambulator w/o AD, drives ADLs Comments: Independent     Hand Dominance        Extremity/Trunk Assessment   Upper Extremity Assessment Upper Extremity Assessment: Generalized weakness    Lower Extremity Assessment Lower Extremity Assessment: Generalized weakness    Cervical / Trunk Assessment Cervical / Trunk Assessment: Kyphotic  Communication   Communication: No difficulties  Cognition Arousal/Alertness: Awake/alert Behavior During Therapy: WFL for  tasks assessed/performed Overall Cognitive Status: Within Functional Limits for tasks assessed                                          General Comments      Exercises     Assessment/Plan    PT Assessment Patient needs continued PT services  PT Problem List Decreased strength;Decreased activity tolerance;Decreased balance;Decreased mobility       PT Treatment Interventions DME instruction;Balance training;Gait training;Stair training;Functional mobility training;Patient/family education;Therapeutic activities;Therapeutic exercise    PT Goals (Current goals can be found in the Care Plan section)  Acute Rehab PT Goals Patient Stated Goal: return home after rehab PT Goal Formulation: With patient Time For Goal Achievement: 06/21/22 Potential to Achieve Goals: Good    Frequency Min 3X/week     Co-evaluation               AM-PAC PT "6 Clicks" Mobility  Outcome Measure Help needed turning from your back to your side while in a flat bed without using bedrails?: None Help needed moving from lying on your back to sitting on the side of a flat bed without using bedrails?: None Help needed moving to and from a bed to a chair (including a wheelchair)?: None Help needed standing up from a chair using your arms (e.g., wheelchair or bedside chair)?: None Help needed to  walk in hospital room?: A Little Help needed climbing 3-5 steps with a railing? : A Lot 6 Click Score: 21    End of Session   Activity Tolerance: Patient tolerated treatment well;Patient limited by fatigue Patient left: in chair;with call bell/phone within reach;with chair alarm set Nurse Communication: Mobility status PT Visit Diagnosis: Unsteadiness on feet (R26.81);Other abnormalities of gait and mobility (R26.89);Muscle weakness (generalized) (M62.81)    Time: 1601-0932 PT Time Calculation (min) (ACUTE ONLY): 21 min   Charges:   PT Evaluation $PT Eval Moderate Complexity: 1 Mod PT  Treatments $Therapeutic Activity: 8-22 mins        Zigmund Gottron, SPT

## 2022-06-21 NOTE — Plan of Care (Signed)
  Problem: Acute Rehab PT Goals(only PT should resolve) Goal: Pt Will Go Supine/Side To Sit Outcome: Progressing Flowsheets (Taken 06/21/2022 1206) Pt will go Supine/Side to Sit: Independently Goal: Patient Will Transfer Sit To/From Stand Outcome: Progressing Flowsheets (Taken 06/21/2022 1206) Patient will transfer sit to/from stand: Independently Goal: Pt Will Transfer Bed To Chair/Chair To Bed Outcome: Progressing Flowsheets (Taken 06/21/2022 1206) Pt will Transfer Bed to Chair/Chair to Bed: with modified independence Goal: Pt Will Ambulate Outcome: Progressing Flowsheets (Taken 06/21/2022 1206) Pt will Ambulate:  with modified independence  50 feet  with least restrictive assistive device   [employer], SPT

## 2022-06-21 NOTE — TOC Initial Note (Signed)
Transition of Care [facility]) - Initial/Assessment Note    Patient Details  Name: Dustin Tucker MRN: [id number] Date of Birth: 1932/01/17  [facility]) CM/SW Contact:    Shade Flood, LCSW Phone Number: 06/21/2022, 3:39 PM  Clinical Narrative:                  Pt admitted from home. PT recommending SNF rehab at dc. Met with pt at bedside today to assess and review dc planning. Pt is in agreement with SNF rehab at dc. CMS provider options reviewed and CMS ratings SNF ratings list provided to pt. Will refer as requested.  TOC will follow and continue to assist with dc planning.  Expected Discharge Plan: Skilled Nursing Facility Barriers to Discharge: Continued Medical Work up   Patient Goals and CMS Choice Patient states their goals for this hospitalization and ongoing recovery are:: get better CMS Medicare.gov Compare Post Acute Care list provided to:: Patient Choice offered to / list presented to : Patient  Expected Discharge Plan and Services Expected Discharge Plan: [place] In-house Referral: Clinical Social Work   Post Acute Care Choice: [place] Living arrangements for the past 2 months: [place]                                      Prior Living Arrangements/Services Living arrangements for the past 2 months: Single Family Home Lives with:: Self Patient language and need for interpreter reviewed:: Yes Do you feel safe going back to the place where you live?: Yes      Need for Family Participation in Patient Care: No (Comment)   Current home services: DME Criminal Activity/Legal Involvement Pertinent to Current Situation/Hospitalization: No - Comment as needed  Activities of Daily Living Home Assistive Devices/Equipment: None ADL Screening (condition at time of admission) Patient's cognitive ability adequate to safely complete daily activities?: Yes Is the patient deaf or have difficulty hearing?:  No Does the patient have difficulty seeing, even when wearing glasses/contacts?: No Does the patient have difficulty concentrating, remembering, or making decisions?: No Patient able to express need for assistance with ADLs?: Yes Does the patient have difficulty dressing or bathing?: No Independently performs ADLs?: Yes (appropriate for developmental age) Does the patient have difficulty walking or climbing stairs?: Yes Weakness of Legs: Both Weakness of Arms/Hands: None  Permission Sought/Granted Permission sought to share information with : Facility Art therapist granted to share information with : Yes, Verbal Permission Granted     Permission granted to share info w AGENCY: snf        Emotional Assessment Appearance:: Appears stated age Attitude/Demeanor/Rapport: Engaged Affect (typically observed): Pleasant Orientation: : Oriented to Self, Oriented to Place, Oriented to  Time, Oriented to Situation Alcohol / Substance Use: Not Applicable Psych Involvement: No (comment)  Admission diagnosis:  Dehydration [E86.0] Hypokalemia [E87.6] 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] UTI (urinary tract infection) [N39.0] Thrombocytopenia (HCC) [D69.6] Chronic anemia [D64.9] Generalized weakness [R53.1] Sepsis secondary to UTI ([place]) [A41.9, N39.0] Stage 3b chronic kidney disease ([place]) [N18.32] Patient Active Problem List   Diagnosis Date Noted   SIRS (systemic inflammatory response syndrome) ([place]) 06/18/2022   Chronic anemia 06/18/2022   GERD (gastroesophageal reflux disease) 06/18/2022   Hypokalemia 06/18/2022   Dehydration 06/18/2022   Elevated d-dimer 06/18/2022   UTI (urinary tract infection) 06/17/2022   Myelodysplastic syndrome ([place])    Port-A-Cath in  place 10/15/2021   CMML (chronic myelomonocytic leukemia) ([place]) 10/01/2021   Chronic myelomonocytic leukemia not having achieved remission ([place]) 10/01/2021   Tachy-brady syndrome ([place]) 09/20/2018    Nausea and vomiting 09/20/2018   Essential hypertension 09/20/2018   Atrial fibrillation with rapid ventricular response ([place])    PSVT (paroxysmal supraventricular tachycardia) ([place]) 03/24/2015   Near syncope 12/13/2014   PCP:  Jani Gravel, MD Pharmacy:   [place], [place] [address] [place] [address] Phone: [phone] Fax: [phone]     Social Determinants of Health (SDOH) Interventions    Readmission Risk Interventions     No data to display

## 2022-06-22 ENCOUNTER — Ambulatory Visit: Payer: Medicare HMO

## 2022-06-22 ENCOUNTER — Other Ambulatory Visit: Payer: Medicare HMO

## 2022-06-22 DIAGNOSIS — R531 Weakness: Secondary | ICD-10-CM | POA: Diagnosis not present

## 2022-06-22 DIAGNOSIS — E440 Moderate protein-calorie malnutrition: Secondary | ICD-10-CM | POA: Insufficient documentation

## 2022-06-22 DIAGNOSIS — N3000 Acute cystitis without hematuria: Secondary | ICD-10-CM | POA: Diagnosis not present

## 2022-06-22 DIAGNOSIS — C9210 Chronic myeloid leukemia, BCR/ABL-positive, not having achieved remission: Secondary | ICD-10-CM | POA: Diagnosis not present

## 2022-06-22 DIAGNOSIS — D649 Anemia, unspecified: Secondary | ICD-10-CM | POA: Diagnosis not present

## 2022-06-22 MED ORDER — CEFDINIR 300 MG PO CAPS
300.0000 mg | ORAL_CAPSULE | Freq: Two times a day (BID) | ORAL | Status: DC
  Administered 2022-06-22 – 2022-06-23 (×2): 300 mg via ORAL
  Filled 2022-06-22 (×2): qty 1

## 2022-06-22 MED ORDER — FLEET ENEMA 7-19 GM/118ML RE ENEM
1.0000 | ENEMA | Freq: Every day | RECTAL | Status: DC | PRN
  Administered 2022-06-23: 1 via RECTAL

## 2022-06-22 NOTE — TOC Progression Note (Signed)
Transition of Care [facility]) - Progression Note    Patient Details  Name: [name] MRN: [id number] Date of Birth: 1932-07-22  Transition of Care [facility]) CM/SW Contact  Shade Flood, LCSW Phone Number: 06/22/2022, 1:36 PM  Clinical Narrative:     TOC following. Met with pt at bedside to review SNF bed offers. Pt selects [facility]. MD anticipating dc tomorrow. Updated Ker[facility] at [facility]. Also updated pt's son at pt request.  TOC to start SNF insurance auth request today. Will follow up in AM.  Expected Discharge Plan: [place] Barriers to Discharge: Continued Medical Work up  Expected Discharge Plan and Services Expected Discharge Plan: [place] In-house Referral: Clinical Social Work   Post Acute Care Choice: [place] Living arrangements for the past 2 months: Single Family Home                                       Social Determinants of Health (SDOH) Interventions    Readmission Risk Interventions     No data to display

## 2022-06-22 NOTE — Progress Notes (Addendum)
Progress Note   Patient: Dustin Tucker URK:[id number] DOB: [birth year] DOA: 06/17/2022     3 DOS: the patient was seen and examined on 06/22/2022   Brief hospital course: As per H&P written by Dr. Clearence Ped On 06/18/2022 Dustin Tucker is a 88 y.o. male with medical history significant of hypertension, GERD, CML, and more presents to ED with chief complaint of fatigue, malaise, myalgias, dyspnea.  Patient reports this all started approximately 24 hours prior to presentation.  Patient reports the myalgias have been the most bothersome.  He has not had any cough.  His dyspnea started at the same time as the myalgias.  It is not worse on exertion.  He has no associated chest pain, no associated fever.  Patient reports he feels dehydrated.  He denies palpitations and near syncope.  He reports that he does have generalized weakness.  He has had no appetite.  He has had an unknown amount of weight loss.  Patient reports that at first he attributed his appetite to chemo, but he has not had a chemo treatment in a month because of the weight loss per his report. He has had some dysuria but he is not sure when it started.  He denies any hematuria.  He does have associated concentration of his urine per his report it is darker in color.  Patient has no other complaints at this time.   Patient does not smoke, does not drink, does not use illicit drugs.  He is vaccinated for COVID.  Patient is full code.   Assessment and Plan: * UTI (urinary tract infection) - UA indicative of UTI at time of admission -Cultures demonstrating more than 100,000 colonies of Klebsiella pneumoniae; microorganism is resistant partially to nitrofurantoin and ampicillin. -antibiotics will be transitioned to oral route following sensitivity. -Patient advise to maintain adequate hydration.  -Continue supportive care and follow clinical improvement.   Elevated d-dimer - D-dimer 4.83 - -Currently denying chest pain or shortness of  breath -VQ scan negative for pulmonary embolism. -Continue maintaining adequate hydration and follow clinical response.   Dehydration - Secondary to poor p.o. intake - Significant abnormality in patient's electrolytes profile including metabolic acidosis with low bicarbonate, and hypokalemia. -Continue to maintain adequate hydration -Continue electrolyte repletion and follow trend.   -Renal function/creatinine close to baseline after fluid resuscitation provided..   Moderate protein malnutrition  -continue feeding supplements -encourage adequate oral intake.  Hypokalemia - Likely secondary to poor p.o. intake - Continue repletion and follow electrolytes trend. -Magnesium has also been repleted and will follow stability.  GERD (gastroesophageal reflux disease) -Continue Protonix -Patient denies nausea/vomiting and currently no reflux symptoms reported.  Chronic anemia - Hemoglobin currently 8.3; most likely due to hemodilution. - no overt bleeding appreciated -Most likely anemia of chronic disease and induced by chemotherapy -Continue to follow hemoglobin trend -Transfuse for hemoglobin less than 7.    SIRS (systemic inflammatory response syndrome) (HCC) -Heart rate 126, respiratory rate around 26-34 at time of admission; and most likely driven by dehydration process.   -Patient WBC is elevated in the setting of leukemia; despite presence of UTI patient's dehydration is most likely responsible for tachycardia. -Continue to treat for UTI -Sepsis has not rule in given presentation and explanation for underlying abnormalities..  Essential hypertension -Stable vital signs: -Continue to follow blood pressure -Continue holding antihypertensive agents.  Thrombocytopenia: -Platelet count 25 K after transfusion. -In the setting of chemotherapeutic agents and underlying CML -Case discussed with oncology service, one unit of  platelets transfused. -continue to Follow  trend/stability  Abdominal distention/ascites -Ultrasound demonstrating no significant ascites for paracentesis -enema and PRN laxatives will be provided.   Subjective:  Patient continue reporting intermittent abd pain and no BM's. Passing gas. Expressed no nausea or vomiting and improvement on dysuria.   Physical Exam: Vitals:   06/21/22 1400 06/21/22 2040 06/22/22 0555 06/22/22 1236  BP: (!) 149/84 (!) 140/69 (!) 157/73 (!) 146/79  Pulse: 98 96 95 98  Resp: (!) '22 19 14 17  '$ Temp: 98 F (36.7 C) 97.9 F (36.6 C) 98.1 F (36.7 C) (!) 97.5 F (36.4 C)  TempSrc: Oral Oral Oral   SpO2: 98% 96% 94% 95%  Weight:      Height:       General exam: Alert, awake, oriented x 3; chronically ill, generally weak and deconditioned. Reports improvement in his dysuria. Respiratory system: Clear to auscultation. Respiratory effort normal. Good saturation on RA. Cardiovascular system:RRR. No rubs or gallops. Gastrointestinal system: Abdomen is mildly distended, soft and nontender. Positive BS; still no BM's. Central nervous system: Alert and oriented. No focal neurological deficits. Extremities: No cyanosis or clubbing. Skin: No petechiae. Psychiatry: Judgement and insight appear normal.flat affect appreciated.  Latest Data Reviewed: CBC: wbc 7.6, hemoglobin 9.0 and platelet count 06,770 [place] panel: Sodium 340, potassium 3.7, chloride 117, bicarb 18, BUN 27, creatinine 1.5 and GFR 45. Magnesium: 2.5.  Family Communication: no family at bedside  Disposition: Status is: Inpatient Remains inpatient appropriate because: continue treatment with antibiotics and follow TOC updates for SNF availability.   Planned Discharge Destination: SNF  Time spent: 35 minutes  Author: Barton Dubois, MD 06/22/2022 5:52 PM  For on call review [email or web address].

## 2022-06-23 ENCOUNTER — Inpatient Hospital Stay (HOSPITAL_COMMUNITY): Payer: Medicare HMO

## 2022-06-23 ENCOUNTER — Ambulatory Visit: Payer: Medicare HMO

## 2022-06-23 ENCOUNTER — Ambulatory Visit: Payer: Medicare HMO | Admitting: Hematology

## 2022-06-23 DIAGNOSIS — E860 Dehydration: Secondary | ICD-10-CM | POA: Diagnosis not present

## 2022-06-23 DIAGNOSIS — I10 Essential (primary) hypertension: Secondary | ICD-10-CM | POA: Diagnosis not present

## 2022-06-23 DIAGNOSIS — N3000 Acute cystitis without hematuria: Secondary | ICD-10-CM | POA: Diagnosis not present

## 2022-06-23 LAB — TYPE AND SCREEN
ABO/RH(D): B POS
Antibody Screen: NEGATIVE

## 2022-06-23 LAB — BPAM PLATELET PHERESIS
Blood Product Expiration Date: 202311142359
ISSUE DATE / TIME: 202311121452
Unit Type and Rh: 5100

## 2022-06-23 LAB — CBC
HCT: 25.7 % — ABNORMAL LOW (ref 39.0–52.0)
Hemoglobin: 8.4 g/dL — ABNORMAL LOW (ref 13.0–17.0)
MCH: 29.9 pg (ref 26.0–34.0)
MCHC: 32.7 g/dL (ref 30.0–36.0)
MCV: 91.5 fL (ref 80.0–100.0)
Platelets: 14 10*3/uL — CL (ref 150–400)
RBC: 2.81 MIL/uL — ABNORMAL LOW (ref 4.22–5.81)
RDW: 17.2 % — ABNORMAL HIGH (ref 11.5–15.5)
WBC: 70.5 10*3/uL (ref 4.0–10.5)
nRBC: 0.1 % (ref 0.0–0.2)

## 2022-06-23 LAB — CULTURE, BLOOD (ROUTINE X 2)

## 2022-06-23 LAB — BASIC METABOLIC PANEL
Anion gap: 7 (ref 5–15)
BUN: 31 mg/dL — ABNORMAL HIGH (ref 8–23)
CO2: 19 mmol/L — ABNORMAL LOW (ref 22–32)
Calcium: 8.5 mg/dL — ABNORMAL LOW (ref 8.9–10.3)
Chloride: 118 mmol/L — ABNORMAL HIGH (ref 98–111)
Creatinine, Ser: 1.48 mg/dL — ABNORMAL HIGH (ref 0.61–1.24)
GFR, Estimated: 45 mL/min — ABNORMAL LOW (ref >60)
Glucose, Bld: 115 mg/dL — ABNORMAL HIGH (ref 70–99)
Potassium: 3.7 mmol/L (ref 3.5–5.1)
Sodium: 144 mmol/L (ref 135–145)

## 2022-06-23 LAB — PREPARE PLATELET PHERESIS: Unit division: 0

## 2022-06-23 LAB — SARS CORONAVIRUS 2 BY RT PCR: SARS Coronavirus 2 by RT PCR: NEGATIVE

## 2022-06-23 MED ORDER — CEFDINIR 300 MG PO CAPS: 300.0000 mg | ORAL_CAPSULE | Freq: Two times a day (BID) | ORAL | 0 refills | Status: AC

## 2022-06-23 MED ORDER — HEPARIN SOD (PORK) LOCK FLUSH 100 UNIT/ML IV SOLN
500.0000 [IU] | INTRAVENOUS | Status: AC | PRN
  Administered 2022-06-23: 500 [IU]
  Filled 2022-06-23: qty 5

## 2022-06-23 MED ORDER — ALBUTEROL SULFATE HFA 108 (90 BASE) MCG/ACT IN AERS: 2.0000 | INHALATION_SPRAY | Freq: Four times a day (QID) | RESPIRATORY_TRACT | 3 refills | Status: DC | PRN

## 2022-06-23 MED ORDER — FUROSEMIDE 10 MG/ML IJ SOLN
20.0000 mg | Freq: Once | INTRAMUSCULAR | Status: AC
  Administered 2022-06-23: 20 mg via INTRAVENOUS
  Filled 2022-06-23: qty 2

## 2022-06-23 MED ORDER — ACETAMINOPHEN 325 MG PO TABS: 650.0000 mg | ORAL_TABLET | Freq: Four times a day (QID) | ORAL | 0 refills | Status: DC | PRN

## 2022-06-23 MED ORDER — CHLORHEXIDINE GLUCONATE CLOTH 2 % EX PADS
6.0000 | MEDICATED_PAD | Freq: Every day | CUTANEOUS | Status: DC
  Administered 2022-06-23: 6 via TOPICAL

## 2022-06-23 MED ORDER — ONDANSETRON HCL 4 MG PO TABS: 4.0000 mg | ORAL_TABLET | Freq: Four times a day (QID) | ORAL | 0 refills | Status: DC | PRN

## 2022-06-23 MED ORDER — POLYETHYLENE GLYCOL 3350 17 G PO PACK: 17.0000 g | PACK | Freq: Every day | ORAL | 2 refills | Status: DC

## 2022-06-23 MED ORDER — SODIUM CHLORIDE 0.9% IV SOLUTION: Freq: Once | INTRAVENOUS | Status: DC

## 2022-06-23 MED ORDER — SIMETHICONE 80 MG PO CHEW
80.0000 mg | CHEWABLE_TABLET | Freq: Once | ORAL | Status: AC
  Administered 2022-06-23: 80 mg via ORAL

## 2022-06-23 MED ORDER — SENNOSIDES-DOCUSATE SODIUM 8.6-50 MG PO TABS: 2.0000 | ORAL_TABLET | Freq: Two times a day (BID) | ORAL | 1 refills | Status: AC

## 2022-06-23 MED ORDER — ENSURE ENLIVE PO LIQD: 237.0000 mL | Freq: Two times a day (BID) | ORAL | 12 refills | Status: DC

## 2022-06-23 MED ORDER — OXYCODONE HCL 5 MG PO TABS: 5.0000 mg | ORAL_TABLET | ORAL | 0 refills | Status: DC | PRN

## 2022-06-23 NOTE — Progress Notes (Signed)
Notified MD of critical value of wbc of 70 and platelets of 14.No new orders received at this time.

## 2022-06-23 NOTE — Care Management Important Message (Signed)
Important Message  Patient Details  Name: Dustin Tucker MRN: [id number] Date of Birth: May 24, 1932   Medicare Important Message Given:  Yes     Tommy Medal 06/23/2022, 2:25 PM

## 2022-06-23 NOTE — Progress Notes (Signed)
Physical Therapy Treatment Patient Details Name: Dustin Tucker MRN: [id number] DOB: [birth year] Today's Date: 06/23/2022   History of Present Illness Dustin Tucker is a 88 y.o. male with medical history significant of hypertension, GERD, CML, and more presents to ED with chief complaint of fatigue, malaise, myalgias, dyspnea.  Patient reports this all started approximately 24 hours prior to presentation.  Patient reports the myalgias have been the most bothersome.  He has not had any cough.  His dyspnea started at the same time as the myalgias.  It is not worse on exertion.  He has no associated chest pain, no associated fever.  Patient reports he feels dehydrated.  He denies palpitations and near syncope.  He reports that he does have generalized weakness.  He has had no appetite.  He has had an unknown amount of weight loss.  Patient reports that at first he attributed his appetite to chemo, but he has not had a chemo treatment in a month because of the weight loss per his report.  He has had some dysuria but he is not sure when it started.  He denies any hematuria.  He does have associated concentration of his urine per his report it is darker in color.  Patient has no other complaints at this time.    PT Comments    Patient requiring HOB elevated and min/held assist to elevate trunk and scoot to EOB. Patient able to complete several bilateral LE exercises at bedside requiring verbal/tactile cueing for proper form with good carryover. Patient able to stand with min assist and ambulate with slowed cadence/RW for same distance as previous session (20 ft), limited mostly due to c/o pain. Patient tolerated sitting up in chair after therapy - nurse aware and notified of patient's pain. Patient will benefit from continued skilled physical therapy in hospital and recommended venue below to increase strength, balance, endurance for safe ADLs and gait.   Recommendations for follow up therapy are one  component of a multi-disciplinary discharge planning process, led by the attending physician.  Recommendations may be updated based on patient status, additional functional criteria and insurance authorization.  Follow Up Recommendations  Skilled nursing-short term rehab (<3 hours/day) Can patient physically be transported by private vehicle: Yes   Assistance Recommended at Discharge Set up Supervision/Assistance  Patient can return home with the following A little help with walking and/or transfers;A little help with bathing/dressing/bathroom;Assistance with cooking/housework;Help with stairs or ramp for entrance   Equipment Recommendations  Rolling walker (2 wheels)    Recommendations for Other Services       Precautions / Restrictions Precautions Precautions: Fall Restrictions Weight Bearing Restrictions: No     Mobility  Bed Mobility Overal bed mobility: Needs Assistance Bed Mobility: Supine to Sit     Supine to sit: HOB elevated, Min assist     General bed mobility comments: min/hand held assist to elevate trunk and scoot to EOB    Transfers Overall transfer level: Needs assistance Equipment used: Rolling walker (2 wheels) Transfers: Sit to/from Stand Sit to Stand: Min assist           General transfer comment: patient requiring min assist for boost to stand    Ambulation/Gait Ambulation/Gait assistance: Min guard Gait Distance (Feet): 20 Feet Assistive device: Rolling walker (2 wheels) Gait Pattern/deviations: Decreased step length - right, Decreased step length - left, Decreased stride length Gait velocity: slow     General Gait Details: patient demonstrates slow labored cadence with RW, able to  ambulate to room door/hallway and back before having to sit due to c/o pain   Stairs             Wheelchair Mobility    Modified Rankin (Stroke Patients Only)       Balance Overall balance assessment: Needs assistance Sitting-balance support:  No upper extremity supported, Feet supported Sitting balance-Leahy Scale: Good Sitting balance - Comments: seated EOB   Standing balance support: Bilateral upper extremity supported, During functional activity, Reliant on assistive device for balance Standing balance-Leahy Scale: Fair Standing balance comment: fair/good with RW                            Cognition Arousal/Alertness: Awake/alert Behavior During Therapy: WFL for tasks assessed/performed Overall Cognitive Status: Within Functional Limits for tasks assessed                                          Exercises General Exercises - Lower Extremity Long Arc Quad: Seated, AROM, Strengthening, Both, 10 reps Hip Flexion/Marching: Seated, AROM, Strengthening, Both, 10 reps Toe Raises: Seated, AROM, Strengthening, Both, 10 reps Heel Raises: Seated, AROM, Strengthening, Both, 10 reps    General Comments        Pertinent Vitals/Pain Pain Assessment Pain Assessment: 0-10 Pain Score: 9  Pain Location: chest (with inspiration), abdomen, ankles Pain Descriptors / Indicators: Aching, Grimacing, Guarding, Sore Pain Intervention(s): Limited activity within patient's tolerance, Monitored during session, Repositioned, Patient requesting pain meds-RN notified    Home Living                          Prior Function            PT Goals (current goals can now be found in the care plan section) Acute Rehab PT Goals Patient Stated Goal: return home after rehab PT Goal Formulation: With patient Time For Goal Achievement: 06/30/22 Potential to Achieve Goals: Good Progress towards PT goals: Progressing toward goals    Frequency    Min 3X/week      PT Plan Current plan remains appropriate    Co-evaluation              AM-PAC PT "6 Clicks" Mobility   Outcome Measure  Help needed turning from your back to your side while in a flat bed without using bedrails?: None Help needed  moving from lying on your back to sitting on the side of a flat bed without using bedrails?: A Little Help needed moving to and from a bed to a chair (including a wheelchair)?: A Little Help needed standing up from a chair using your arms (e.g., wheelchair or bedside chair)?: A Little Help needed to walk in hospital room?: A Little Help needed climbing 3-5 steps with a railing? : A Lot 6 Click Score: 18    End of Session   Activity Tolerance: Patient tolerated treatment well;Patient limited by fatigue;Patient limited by pain Patient left: in chair;with call bell/phone within reach;with chair alarm set Nurse Communication: Mobility status PT Visit Diagnosis: Unsteadiness on feet (R26.81);Other abnormalities of gait and mobility (R26.89);Muscle weakness (generalized) (M62.81)     Time: 0488-8916 PT Time Calculation (min) (ACUTE ONLY): 24 min  Charges:  $Gait Training: 8-22 mins $Therapeutic Exercise: 8-22 mins  Zigmund Gottron, SPT

## 2022-06-23 NOTE — Progress Notes (Addendum)
Patient had not had any po intake during day shift.  Patient encouraged to have po intake, patient did eat two ice creams.  Patient then shortly after complained of abdominal pain and patient did have abdominal distention. At this time as well, patient heart rate did start to increase. EKG done and showed svt with pvc with a rate of 130  Notified MD and received order for abdominal x ray.

## 2022-06-23 NOTE — Progress Notes (Signed)
Patient has had a  difficult night.  Patient has complained of abdominal pain and gas.  Patient had an episode of tremors around 0400, vitals were stable at that time.  Patient was given enema and simethicone were MD orders.  Patient did report relief from enema and moderate sized bowel movement was had.  Patient heart rate has been irregular this shift, moving between the early 90s to the 140s.

## 2022-06-23 NOTE — Discharge Summary (Signed)
Dustin Tucker, is a 88 y.o. male  DOB [birth year]  MRN [id number].  Admission date:  06/17/2022  Admitting Physician  Barton Dubois, MD  Discharge Date:  06/23/2022   Primary MD  Jani Gravel, MD  Recommendations for primary care physician for things to follow:   1)Repeat CBC and CMP on Friday 06/25/2022--- 2)Please follow-up with hematologist/oncologist Dr. Derek Jack, MD on Friday 06/25/22 -Address: inside [facility] ([facility]th [facility]), [place], [place], [place] [address] Phone: [phone] 3)Avoid ibuprofen/Advil/Aleve/Motrin/Goody Powders/Naproxen/BC powders/Meloxicam/Diclofenac/Indomethacin and other Nonsteroidal anti-inflammatory medications as these will make you more likely to bleed and can cause stomach ulcers, can also cause Kidney problems.  4)Call if any Bleeding concerns.. 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] UTI (urinary tract infection) [N39.0] Thrombocytopenia (HCC) [D69.6] Chronic anemia [D64.9] Generalized weakness [R53.1] Sepsis secondary to UTI ([place]) [A41.9, N39.0] Stage 3b chronic kidney disease ([place]) [N18.32]    Principal Problem:   UTI (urinary tract infection) Active Problems:   Essential hypertension   SIRS (systemic inflammatory response syndrome) (HCC)   Chronic anemia   GERD (gastroesophageal reflux disease)   Hypokalemia   Dehydration   Elevated d-dimer   Malnutrition of moderate degree      Past Medical History:  Diagnosis Date   Arthritis     hands   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   Hypertension    Port-A-Cath in place 10/15/2021    Past Surgical History:  Procedure Laterality Date   CARPAL TUNNEL RELEASE Right    COLONOSCOPY N/A 09/19/2013   Procedure: COLONOSCOPY;  Surgeon: Rogene Houston, MD;  [facility];  Service: Endoscopy;  Laterality: N/A;  830   FINGER AMPUTATION Left    4th and 5th   left eye blindness Left    PORTACATH PLACEMENT Right 10/16/2021   Procedure: INSERTION PORT-A-CATH;  Surgeon: Aviva Signs, MD;  Location: [facility];  Service: General;  Laterality: Right;   SVT ABLATION N/A 09/21/2018   Procedure: SVT ABLATION;  Surgeon: Constance Haw, MD;  Location: [facility];  Service: Cardiovascular;  Laterality: N/A;     HPI  from the history and physical done on the day of admission:       HPI: Dustin Tucker is a 88 y.o. male with medical history significant of hypertension, GERD, CML, and more presents to ED with chief complaint of fatigue, malaise, myalgias, dyspnea.  Patient reports this all started approximately 24 hours prior to presentation.  Patient reports the myalgias have been the most bothersome.  He has not had any cough.  His dyspnea started at the same time as the myalgias.  It is not worse on exertion.  He has  no associated chest pain, no associated fever.  Patient reports he feels dehydrated.  He denies palpitations and near syncope.  He reports that he does have generalized weakness.  He has had no appetite.  He has had an unknown amount of weight loss.  Patient reports that at first he attributed his appetite to chemo, but he has not had a chemo treatment in a month because of the weight loss per his report. He has had some dysuria but he is not sure when it started.  He denies any hematuria.  He does have associated concentration of his urine per his report it is darker in color.  Patient has no other complaints at this time.   Patient does not smoke, does not drink,  does not use illicit drugs.  He is vaccinated for COVID.  Patient is full code.     Review of Systems: As mentioned in the history of present illness. All other systems reviewed and are negative.     Hospital Course:   Assessment and Plan:  1) Klebsiella UTI -POA -Urine culture noted -Treated with IV Rocephin okay to discharge on p.o. Omnicef to complete treatment patient is immunocompromised due to underlying CML   2)Elevated d-dimer-acute phase reactant in a patient with acute infection and CML - D-dimer 4.83 - -Currently denying chest pain or shortness of breath -VQ scan negative for pulmonary embolism. --Low clinical index of suspicion for for DVT or PE     3)Dehydration/CKD 3A -Creatinine improved to 1.48 from 1.61 with hydration -Repeat CMP with Dr. 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 Delton Coombes on 06/25/2022 for repeat CBC and reevaluation  5) significant leukocytosis--- WBC 67.6 >> 70.5 -In the setting of #4 above, compunded by Klebsiella UTI -Continue to Hold Azacitidine  Therapy -Follow-up with Dr. Delton Coombes on 06/25/2022 for repeat CBC as above  6) acute on chronic anemia and acute on chronic thrombocytopenia- Hgb 9.0 >> 8.4  Platelets 25 >> 14 -No obvious bleeding concerns at this time Continue to Hold Azacitidine  Therapy -Discussed with Dr. Delton Coombes who recommends transfusion of 1 unit of platelets today 06/23/2022, he will repeat CBC with patient on 06/25/2022 and determine if further transfusions are needed at that time   7)Moderate protein malnutrition  -continue feeding supplements -encourage adequate oral intake.   8)GERD (gastroesophageal reflux disease) -Continue Protonix -Patient denies nausea/vomiting and  currently no reflux symptoms reported.   9)Essential hypertension -Continue to hold clonidine, BP stable  10) constipation/Abdominal distention/ascites -Ultrasound demonstrating no significant ascites for paracentesis -enema and PRN laxatives will be provided.   Discharge Condition: Stable.... Discharge to [facility] rehab  Follow UP   Contact information for after-discharge care     [facility] .   Service: Skilled Nursing Contact information: [address] [place] [phone]                      Consults obtained -full consult with oncologist Dr. Delton Coombes  Diet and Activity recommendation:  As advised  Discharge Instructions    Discharge Instructions     Call MD for:  difficulty breathing, headache or visual disturbances   Complete by: As directed    Call MD for:  persistant dizziness  or light-headedness   Complete by: As directed    Call MD for:  persistant nausea and vomiting   Complete by: As directed    Call MD for:  temperature >100.4   Complete by: As directed    Diet - low sodium heart healthy   Complete by: As directed    Discharge instructions   Complete by: As directed    1)Repeat CBC and CMP on Friday 06/25/2022--- 2)Please follow-up with hematologist/oncologist Dr. Derek Jack, MD on Friday 06/25/22 -Address: inside [facility] [place], [place], [place] [address] Phone: [phone] 3)Avoid ibuprofen/Advil/Aleve/Motrin/Goody Powders/Naproxen/BC powders/Meloxicam/Diclofenac/Indomethacin and other Nonsteroidal anti-inflammatory medications as these will make you more likely to bleed and can cause stomach ulcers, can also cause Kidney problems.  4)Call if any Bleeding concerns.. -   Increase activity slowly   Complete by: As directed        Discharge Medications     Allergies as of 06/23/2022       Reactions   Aspirin  Other (See Comments)   Gi upset, can tolerate baby aspirin   Flomax [tamsulosin] Nausea Only        Medication List     STOP taking these medications    azaCITIDine 5 mg/2 mLs in lactated ringers infusion   cloNIDine 0.1 MG tablet Commonly known as: CATAPRES   docusate sodium 100 MG capsule Commonly known as: Colace   sulfamethoxazole-trimethoprim 800-160 MG tablet Commonly known as: BACTRIM DS       TAKE these medications    acetaminophen 325 MG tablet Commonly known as: TYLENOL Take 2 tablets (650 mg total) by mouth every 6 (six) hours as needed for mild pain, fever or headache (or Fever >/= 101). What changed:  medication strength how much to take when to take this reasons to take this   albuterol 108 (90 Base) MCG/ACT inhaler Commonly known as: VENTOLIN HFA Inhale 2 puffs into the lungs every 6 (six) hours as needed for wheezing or shortness of breath.   alfuzosin 10 MG 24 hr tablet Commonly known as: UROXATRAL Take 1 tablet (10 mg total) by mouth daily with breakfast.   cefdinir 300 MG capsule Commonly known as: OMNICEF Take 1 capsule (300 mg total) by mouth 2 (two) times daily for 3 days.   feeding supplement Liqd Take 237 mLs by mouth 2 (two) times daily between meals. Start taking on: June 24, 2022   furosemide 20 MG tablet Commonly known as: LASIX Take 1 tablet (20 mg total) by mouth daily as needed (take as needed every morning for ankle swelling).   levocetirizine 5 MG tablet Commonly known as: XYZAL Take 5 mg by mouth daily.   megestrol 400 MG/10ML suspension Commonly known as: MEGACE Take 10 mLs (400 mg total) by mouth 2 (two) times daily.   ondansetron 4 MG tablet Commonly known as: ZOFRAN Take 1 tablet (4 mg total) by mouth every 6 (six) hours as needed for nausea.   oxyCODONE 5 MG immediate release tablet Commonly known as: Oxy IR/ROXICODONE Take 1 tablet (5 mg total) by mouth every 4 (four) hours as needed for moderate  pain.   pantoprazole 40 MG tablet Commonly known as: PROTONIX Take 1 tablet (40 mg total) by mouth daily.   polyethylene glycol 17 g packet Commonly known as: MiraLax Take 17 g by mouth daily.   potassium chloride SA 20 MEQ tablet Commonly known as: KLOR-CON M Take 1 tablet (20 mEq total) by mouth daily.  senna-docusate 8.6-50 MG tablet Commonly known as: Senokot-S Take 2 tablets by mouth 2 (two) times daily.       Major procedures and Radiology Reports - PLEASE review detailed and final reports for all details, in brief -  DG Abd 1 View  Result Date: 06/23/2022 CLINICAL DATA:  [id number] Abdominal distention [id number] EXAM: ABDOMEN - 1 VIEW COMPARISON:  Ultrasound abdomen 06/21/2022, CT abdomen pelvis 06/21/2016 FINDINGS: Gaseous dilatation of the large bowel. Limited evaluation of the small bowel due to overlying large bowel. Stool within the rectum. Question free gas in the right upper quadrant with limited evaluation on this supine radiograph. Limited evaluation for free gas on this supine radiograph. No radio-opaque calculi or other significant radiographic abnormality are seen. IMPRESSION: Gaseous dilatation of the large bowel.  Stool within the rectum. Electronically Signed   By: Iven Finn M.D.   On: 06/23/2022 00:55   [place] ASCITES (ABDOMEN LIMITED)  Result Date: 06/21/2022 CLINICAL DATA:  Ascites EXAM: LIMITED ABDOMEN ULTRASOUND FOR ASCITES TECHNIQUE: Limited ultrasound survey for ascites was performed in all four abdominal quadrants. COMPARISON:  None Available. FINDINGS: No ascites identified. IMPRESSION: No ascites. Electronically Signed   By: Lavonia Dana M.D.   On: 06/21/2022 11:42   NM Pulmonary Perfusion  Result Date: 06/18/2022 CLINICAL DATA:  Evaluate for pulmonary embolism. History of shortness of breath for 1 week. EXAM: NUCLEAR MEDICINE PERFUSION LUNG SCAN TECHNIQUE: Perfusion images were obtained in multiple projections after intravenous injection of  radiopharmaceutical. Ventilation scans intentionally deferred if perfusion scan and chest x-ray adequate for interpretation during COVID 19 epidemic. RADIOPHARMACEUTICALS:  Chest radiograph from 06/18/2022 mCi Tc-95mMAA IV COMPARISON:  None Available. FINDINGS: No significant peripheral segmental perfusion defects identified to suggest an acute pulmonary embolus. Soft tissue attenuation artifact secondary to upper extremities identified on the lateral projection images. IMPRESSION: 1. No evidence for pulmonary embolus. Electronically Signed   By: TKerby MoorsM.D.   On: 06/18/2022 08:44   DG CHEST PORT 1 VIEW  Result Date: 06/18/2022 CLINICAL DATA:  Dyspnea EXAM: PORTABLE CHEST 1 VIEW COMPARISON:  06/17/2022. FINDINGS: Heart is enlarged and the mediastinal contour is stable. There is atherosclerotic calcification of the aorta. Chronic elevation of the of the left diaphragm with atelectasis at the left lung base. No effusion or pneumothorax. A right chest port is stable in appearance. No acute osseous abnormality IMPRESSION: Chronic elevation of the left diaphragm with atelectasis at the left lung base. Electronically Signed   By: LBrett FairyM.D.   On: 06/18/2022 04:49   DG Chest Port 1 View  Result Date: 06/17/2022 CLINICAL DATA:  Shortness of breath. EXAM: PORTABLE CHEST 1 VIEW COMPARISON:  Radiograph 10/16/2021 FINDINGS: Accessed right chest port in place. Chronic elevation of right hemidiaphragm. Stable mild cardiomegaly with unchanged mediastinal contours. No acute airspace disease, pleural effusion or pneumothorax. Stable osseous structures. IMPRESSION: 1. No acute abnormality. Stable mild cardiomegaly. 2. Chronic elevation of right hemidiaphragm. Electronically Signed   By: MKeith RakeM.D.   On: 06/17/2022 19:31   CT BONE MARROW BIOPSY & ASPIRATION  Result Date: 06/15/2022 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 EXAM: CT GUIDED BONE MARROW ASPIRATES  AND BIOPSY Physician: AStephan Minister HAnselm Pancoast MD MEDICATIONS: None. ANESTHESIA/SEDATION: Moderate (conscious) sedation was employed during this procedure. A total of Versed 142mand fentanyl 100 mcg was administered intravenously at the order of the provider performing the procedure. Total intra-service moderate sedation time: 12 minutes. Patient's level of consciousness and vital signs were monitored  continuously by radiology nurse throughout the procedure under the supervision of the provider performing the procedure. COMPLICATIONS: None immediate. PROCEDURE: The procedure was explained to the patient. The risks and benefits of the procedure were discussed and the patient's questions were addressed. Informed consent was obtained from the patient. The patient was placed prone on CT table. Images of the pelvis were obtained. The right side of back was prepped and draped in sterile fashion. The skin and right posterior ilium were anesthetized with 1% lidocaine. 11 gauge bone needle was directed into the right ilium with CT guidance. Two aspirates and two core biopsies were obtained. Bandage placed over the puncture site. RADIATION DOSE REDUCTION: This exam was performed according to the departmental dose-optimization program which includes automated exposure control, adjustment of the mA and/or kV according to patient size and/or use of iterative reconstruction technique. IMPRESSION: CT guided bone marrow aspiration and core biopsy. Electronically Signed   By: Markus Daft M.D.   On: 06/15/2022 16:36    Micro Results  Recent Results (from the past 240 hour(s))  Blood culture (routine x 2)     Status: Abnormal   Collection Time: 06/17/22  6:33 PM   Specimen: Porta Cath; Blood  Result Value Ref Range Status   Specimen Description   Final    PORTA CATH Performed at [facility], [address] [place], [place] [address]    Special Requests   Final    Immunocompromised BOTTLES DRAWN AEROBIC AND ANAEROBIC Blood Culture  adequate volume Performed at [place] [place], [place] [address]    Culture  Setup Time   Final    GRAM NEGATIVE RODS AEROBIC BOTTLE ONLY Gram Stain Report Called to,Read Back By and Verified With: LAUREN COLEMAN,LPN 88/89/16 <[id number]  BY SSLANE IN BOTH AEROBIC AND ANAEROBIC BOTTLES Organism ID to follow CRITICAL RESULT CALLED TO, READ BACK BY AND VERIFIED WITH:  Lonia Chimera, RN 06/18/22 1448 A. LAFRANCE Performed at [facility], [address] [place], [place] [address]    Culture KLEBSIELLA PNEUMONIAE (A)  Final   Report Status 06/23/2022 FINAL  Final   Organism ID, Bacteria KLEBSIELLA PNEUMONIAE  Final      Susceptibility   Klebsiella pneumoniae - MIC*    AMPICILLIN >=32 RESISTANT Resistant     CEFAZOLIN <=4 SENSITIVE Sensitive     CEFEPIME <=0.12 SENSITIVE Sensitive     CEFTAZIDIME <=1 SENSITIVE Sensitive     CEFTRIAXONE <=0.25 SENSITIVE Sensitive     CIPROFLOXACIN <=0.25 SENSITIVE Sensitive     GENTAMICIN <=1 SENSITIVE Sensitive     IMIPENEM 0.5 SENSITIVE Sensitive     TRIMETH/SULFA <=20 SENSITIVE Sensitive     AMPICILLIN/SULBACTAM 8 SENSITIVE Sensitive     PIP/TAZO <=4 SENSITIVE Sensitive     * KLEBSIELLA PNEUMONIAE  Blood Culture ID Panel (Reflexed)     Status: Abnormal   Collection Time: 06/17/22  6:33 PM  Result Value Ref Range Status   Enterococcus faecalis NOT DETECTED NOT DETECTED Final   Enterococcus Faecium NOT DETECTED NOT DETECTED Final   Listeria monocytogenes NOT DETECTED NOT DETECTED Final   Staphylococcus species NOT DETECTED NOT DETECTED Final   Staphylococcus aureus (BCID) NOT DETECTED NOT DETECTED Final   Staphylococcus epidermidis NOT DETECTED NOT DETECTED Final   Staphylococcus lugdunensis NOT DETECTED NOT DETECTED Final   Streptococcus species NOT DETECTED NOT DETECTED Final   Streptococcus agalactiae NOT DETECTED NOT DETECTED Final   Streptococcus pneumoniae NOT DETECTED NOT DETECTED Final   Streptococcus pyogenes NOT  DETECTED NOT  DETECTED Final   A.calcoaceticus-baumannii NOT DETECTED NOT DETECTED Final   Bacteroides fragilis NOT DETECTED NOT DETECTED Final   Enterobacterales DETECTED (A) NOT DETECTED Final    Comment: Enterobacterales represent a large order of gram negative bacteria, not a single organism. CRITICAL RESULT CALLED TO, READ BACK BY AND VERIFIED WITH:  Lonia Chimera, RN 06/18/22 1448 A. LAFRANCE    Enterobacter cloacae complex NOT DETECTED NOT DETECTED Final   Escherichia coli NOT DETECTED NOT DETECTED Final   Klebsiella aerogenes NOT DETECTED NOT DETECTED Final   Klebsiella oxytoca NOT DETECTED NOT DETECTED Final   Klebsiella pneumoniae DETECTED (A) NOT DETECTED Final    Comment: CRITICAL RESULT CALLED TO, READ BACK BY AND VERIFIED WITH:  Lonia Chimera, RN 06/18/22 1448 A. LAFRANCE    Proteus species NOT DETECTED NOT DETECTED Final   Salmonella species NOT DETECTED NOT DETECTED Final   Serratia marcescens NOT DETECTED NOT DETECTED Final   Haemophilus influenzae NOT DETECTED NOT DETECTED Final   Neisseria meningitidis NOT DETECTED NOT DETECTED Final   Pseudomonas aeruginosa NOT DETECTED NOT DETECTED Final   Stenotrophomonas maltophilia NOT DETECTED NOT DETECTED Final   Candida albicans NOT DETECTED NOT DETECTED Final   Candida auris NOT DETECTED NOT DETECTED Final   Candida glabrata NOT DETECTED NOT DETECTED Final   Candida krusei NOT DETECTED NOT DETECTED Final   Candida parapsilosis NOT DETECTED NOT DETECTED Final   Candida tropicalis NOT DETECTED NOT DETECTED Final   Cryptococcus neoformans/gattii NOT DETECTED NOT DETECTED Final   CTX-M ESBL NOT DETECTED NOT DETECTED Final   Carbapenem resistance IMP NOT DETECTED NOT DETECTED Final   Carbapenem resistance KPC NOT DETECTED NOT DETECTED Final   Carbapenem resistance NDM NOT DETECTED NOT DETECTED Final   Carbapenem resist OXA 48 LIKE NOT DETECTED NOT DETECTED Final   Carbapenem resistance VIM NOT DETECTED NOT DETECTED Final    Comment:  Performed at [facility], [address] [place], [place] [address]  Blood culture (routine x 2)     Status: Abnormal   Collection Time: 06/17/22  7:06 PM   Specimen: BLOOD RIGHT HAND  Result Value Ref Range Status   Specimen Description   Final    BLOOD RIGHT HAND Performed at [facility], [place] [address], [place], [place] [address]    Special Requests   Final    Immunocompromised BOTTLES DRAWN AEROBIC AND ANAEROBIC Blood Culture adequate volume Performed at [facility], [address] [place], [place] [address]    Culture  Setup Time   Final    GRAM NEGATIVE RODS Gram Stain Report Called to,Read Back By and Verified With: ADKINS,N @ 0104 ON 06/19/22 BY JUW AEROBIC BOTTLE ONLY GS DONE @ [facility]  CRITICAL RESULT CALLED TO, READ BACK BY AND VERIFIED WITH: N ADKINS,RN_0  06/19/22 [place] ANAEROBIC BOTTLE ONLY IDENTIFICATION PERFORMED ON PREVIOUS CULTURE GS DONE @ [facility] Performed at [facility], [address], [place], [place] [address]    Culture (A)  Final    KLEBSIELLA PNEUMONIAE SUSCEPTIBILITIES PERFORMED ON PREVIOUS CULTURE WITHIN THE LAST 5 DAYS. Performed at [facility], [place] [address] [place], [place] [address]    Report Status 06/20/2022 FINAL  Final  Resp Panel by RT-PCR (Flu A&B, Covid) Anterior Nasal Swab     Status: None   Collection Time: 06/17/22  7:30 PM   Specimen: Anterior Nasal Swab  Result Value Ref Range Status   SARS Coronavirus 2 by RT PCR NEGATIVE  NEGATIVE Final    Comment: (NOTE) SARS-CoV-2 target nucleic acids are NOT DETECTED.  The SARS-CoV-2 RNA is generally detectable in upper respiratory specimens during the acute phase of infection. The lowest concentration of SARS-CoV-2 viral copies this assay can detect is 138 copies/mL. A negative result does not preclude SARS-Cov-2 infection and should not be used as the sole basis for treatment or other patient management decisions. A negative result may occur with  improper specimen  collection/handling, submission of specimen other than nasopharyngeal swab, presence of viral mutation(s) within the areas targeted by this assay, and inadequate number of viral copies(<138 copies/mL). A negative result must be combined with clinical observations, patient history, and epidemiological information. The expected result is Negative.  Fact Sheet for Patients:  EntrepreneurPulse.com.au  Fact Sheet for Healthcare Providers:  IncredibleEmployment.be  This test is no t yet approved or cleared by the [place] FDA and  has been authorized for detection and/or diagnosis of SARS-CoV-2 by FDA under an Emergency Use Authorization (EUA). This EUA will remain  in effect (meaning this test can be used) for the duration of the COVID-19 declaration under Section 564(b)(1) of the Act, 21 U.S.C.section 360bbb-3(b)(1), unless the authorization is terminated  or revoked sooner.       Influenza A by PCR NEGATIVE NEGATIVE Final   Influenza B by PCR NEGATIVE NEGATIVE Final    Comment: (NOTE) The Xpert Xpress SARS-CoV-2/FLU/RSV plus assay is intended as an aid in the diagnosis of influenza from Nasopharyngeal swab specimens and should not be used as a sole basis for treatment. Nasal washings and aspirates are unacceptable for Xpert Xpress SARS-CoV-2/FLU/RSV testing.  Fact Sheet for Patients: EntrepreneurPulse.com.au  Fact Sheet for Healthcare Providers: IncredibleEmployment.be  This test is not yet approved or cleared by the [place] FDA and has been authorized for detection and/or diagnosis of SARS-CoV-2 by FDA under an Emergency Use Authorization (EUA). This EUA will remain in effect (meaning this test can be used) for the duration of the COVID-19 declaration under Section 564(b)(1) of the Act, 21 U.S.C. section 360bbb-3(b)(1), unless the authorization is terminated or revoked.  Performed at [facility], [address] [place], [place] [address]   Urine Culture     Status: Abnormal   Collection Time: 06/17/22  8:07 PM   Specimen: Urine, Clean Catch  Result Value Ref Range Status   Specimen Description   Final    URINE, CLEAN CATCH Performed at [facility], [address], [place], [place] [address]    Special Requests   Final    NONE Performed at [facility], [address] [place], [place] [address]    Culture >=100,000 COLONIES/mL KLEBSIELLA PNEUMONIAE (A)  Final   Report Status 06/20/2022 FINAL  Final   Organism ID, Bacteria KLEBSIELLA PNEUMONIAE (A)  Final      Susceptibility   Klebsiella pneumoniae - MIC*    AMPICILLIN RESISTANT Resistant     CEFAZOLIN <=4 SENSITIVE Sensitive     CEFEPIME <=0.12 SENSITIVE Sensitive     CEFTRIAXONE <=0.25 SENSITIVE Sensitive     CIPROFLOXACIN <=0.25 SENSITIVE Sensitive     GENTAMICIN <=1 SENSITIVE Sensitive     IMIPENEM <=0.25 SENSITIVE Sensitive     NITROFURANTOIN 64 INTERMEDIATE Intermediate     TRIMETH/SULFA <=20 SENSITIVE Sensitive     AMPICILLIN/SULBACTAM 4 SENSITIVE Sensitive     PIP/TAZO <=4 SENSITIVE Sensitive     * >=100,000 COLONIES/mL KLEBSIELLA PNEUMONIAE   Today   Subjective    Dustin Tucker today has  no new complaints... Except for ongoing fatigue -Participated in physical therapy No fever  Or chills         Patient has been seen and examined prior to discharge   Objective   Blood pressure (!) 121/58, pulse 89, temperature 98.9 F (37.2 C), resp. rate 20, height _0  (1.651 m), weight 67.4 kg, SpO2 96 %.   Intake/Output Summary (Last 24 hours) at 06/23/2022 1553 Last data filed at 06/23/2022 1500 Gross per 24 hour  Intake 960 ml  Output 1100 ml  Net -140 ml    Exam Gen:- Awake Alert, no acute distress, chronically ill-appearing HEENT:- [place].AT, No sclera icterus Neck-Supple Neck,No JVD,.  Lungs-  CTAB , good air movement bilaterally CV- S1, S2 normal, regular, right-sided Port-A-Cath site is  clean dry and intact Abd-  +ve B.Sounds, Abd Soft, No tenderness, per patient less distended after BM Extremity/Skin:- No  edema,   good pulses, no ecchymosis, bruising or bleeding concerns Psych-affect is appropriate, oriented x3 Neuro-generalized weakness, no new focal deficits, no tremors    Data Review   CBC w Diff:  Lab Results  Component Value Date   WBC 70.5 (HH) 06/23/2022   HGB 8.4 (L) 06/23/2022   HGB 8.8 (L) 12/03/2021   HCT 25.7 (L) 06/23/2022   HCT 25.8 (L) 12/03/2021   PLT 14 (LL) 06/23/2022   LYMPHOPCT 15 06/18/2022   BANDSPCT 1 06/18/2022   MONOPCT 43 06/18/2022   EOSPCT 0 06/18/2022   BASOPCT 0 06/18/2022    CMP:  Lab Results  Component Value Date   NA 144 06/23/2022   NA 138 12/03/2021   K 3.7 06/23/2022   CL 118 (H) 06/23/2022   CO2 19 (L) 06/23/2022   BUN 31 (H) 06/23/2022   BUN 35 (H) 12/03/2021   CREATININE 1.48 (H) 06/23/2022   PROT 5.6 (L) 06/18/2022   ALBUMIN 2.1 (L) 06/18/2022   BILITOT 0.7 06/18/2022   ALKPHOS 61 06/18/2022   AST 83 (H) 06/18/2022   ALT 59 (H) 06/18/2022  .  Total Discharge time is about 33 minutes  Roxan Hockey M.D on 06/23/2022 at 3:53 PM  Go to [email or web address] -  for contact info  [facility] - Office  [phone]

## 2022-06-23 NOTE — Discharge Instructions (Signed)
1)Repeat CBC and CMP on Friday 06/25/2022--- 2)Please follow-up with hematologist/oncologist Dr. Derek Jack, MD on Friday 06/25/22 -Address: inside [facility] [place], [place], [place] [address] Phone: [phone] 3)Avoid ibuprofen/Advil/Aleve/Motrin/Goody Powders/Naproxen/BC powders/Meloxicam/Diclofenac/Indomethacin and other Nonsteroidal anti-inflammatory medications as these will make you more likely to bleed and can cause stomach ulcers, can also cause Kidney problems.  4)Call if any Bleeding concerns.. -

## 2022-06-23 NOTE — Progress Notes (Signed)
Ng Discharge Note  Admit Date:  06/17/2022 Discharge date: 06/23/2022   Anselm Lis to be D/C'd Skilled nursing facility per MD order.  AVS completed. Patient/caregiver able to verbalize understanding.  Discharge Medication: Allergies as of 06/23/2022       Reactions   Aspirin Other (See Comments)   Gi upset, can tolerate baby aspirin   Flomax [tamsulosin] Nausea Only        Medication List     STOP taking these medications    azaCITIDine 5 mg/2 mLs in lactated ringers infusion   cloNIDine 0.1 MG tablet Commonly known as: CATAPRES   docusate sodium 100 MG capsule Commonly known as: Colace   sulfamethoxazole-trimethoprim 800-160 MG tablet Commonly known as: BACTRIM DS       TAKE these medications    acetaminophen 325 MG tablet Commonly known as: TYLENOL Take 2 tablets (650 mg total) by mouth every 6 (six) hours as needed for mild pain, fever or headache (or Fever >/= 101). What changed:  medication strength how much to take when to take this reasons to take this   albuterol 108 (90 Base) MCG/ACT inhaler Commonly known as: VENTOLIN HFA Inhale 2 puffs into the lungs every 6 (six) hours as needed for wheezing or shortness of breath.   alfuzosin 10 MG 24 hr tablet Commonly known as: UROXATRAL Take 1 tablet (10 mg total) by mouth daily with breakfast.   cefdinir 300 MG capsule Commonly known as: OMNICEF Take 1 capsule (300 mg total) by mouth 2 (two) times daily for 3 days.   feeding supplement Liqd Take 237 mLs by mouth 2 (two) times daily between meals. Start taking on: June 24, 2022   furosemide 20 MG tablet Commonly known as: LASIX Take 1 tablet (20 mg total) by mouth daily as needed (take as needed every morning for ankle swelling).   levocetirizine 5 MG tablet Commonly known as: XYZAL Take 5 mg by mouth daily.   megestrol 400 MG/10ML suspension Commonly known as: MEGACE Take 10 mLs (400 mg total) by mouth 2 (two) times daily.    ondansetron 4 MG tablet Commonly known as: ZOFRAN Take 1 tablet (4 mg total) by mouth every 6 (six) hours as needed for nausea.   oxyCODONE 5 MG immediate release tablet Commonly known as: Oxy IR/ROXICODONE Take 1 tablet (5 mg total) by mouth every 4 (four) hours as needed for moderate pain.   pantoprazole 40 MG tablet Commonly known as: PROTONIX Take 1 tablet (40 mg total) by mouth daily.   polyethylene glycol 17 g packet Commonly known as: MiraLax Take 17 g by mouth daily.   potassium chloride SA 20 MEQ tablet Commonly known as: KLOR-CON M Take 1 tablet (20 mEq total) by mouth daily.   senna-docusate 8.6-50 MG tablet Commonly known as: Senokot-S Take 2 tablets by mouth 2 (two) times daily.        Discharge Assessment: Vitals:   06/23/22 1635 06/23/22 1827  BP: (!) 124/57 (!) 106/56  Pulse: 86 89  Resp: 18 18  Temp: 98.3 F (36.8 C) 98.1 F (36.7 C)  SpO2: 100% 98%   Skin clean, dry and intact without evidence of skin break down, no evidence of skin tears noted. IV catheter discontinued intact. Site without signs and symptoms of complications - no redness or edema noted at insertion site, patient denies c/o pain - only slight tenderness at site.  Dressing with slight pressure applied.  D/c Instructions-Education: Discharge instructions given to patient/family with verbalized understanding.  D/c education completed with patient/family including follow up instructions, medication list, d/c activities limitations if indicated, with other d/c instructions as indicated by MD - patient able to verbalize understanding, all questions fully answered. Patient instructed to return to ED, call 911, or call MD for any changes in condition.  Patient escorted via [place], and D/C to Skilled nursing facility by staff.  Tsosie Billing, LPN [id number] 2:44 PM

## 2022-06-23 NOTE — TOC Transition Note (Signed)
Transition of Care [facility]) - CM/SW Discharge Note   Patient Details  Name: Dustin Tucker MRN: [id number] Date of Birth: [birth year]  Transition of Care [facility]) CM/SW Contact:  Salome Arnt, [place] Phone Number: 06/23/2022, 3:20 PM   Clinical Narrative:  Pt d/c today to [facility]. Pt's son, Jimmylee and facility aware and agreeable. COVID test pending. RN given number to call report. LCSW will send d/c summary when completed. Pt will transfer with staff. Authorization received.      Final next level of care: Skilled Nursing Facility Barriers to Discharge: Barriers Resolved   Patient Goals and CMS Choice Patient states their goals for this hospitalization and ongoing recovery are:: get better CMS Medicare.gov Compare Post Acute Care list provided to:: Patient Choice offered to / list presented to : Patient  Discharge Placement              Patient chooses bed at: [facility] Patient to be transferred to facility by: staff Name of family member notified: son Patient and family notified of of transfer: 06/23/22  Discharge Plan and Services In-house Referral: Clinical Social Work   Post Acute Care Choice: [place]                               Social Determinants of Health ([place]) Interventions     Readmission Risk Interventions     No data to display

## 2022-06-24 ENCOUNTER — Other Ambulatory Visit: Payer: Self-pay | Admitting: Adult Health

## 2022-06-24 ENCOUNTER — Non-Acute Institutional Stay (SKILLED_NURSING_FACILITY): Payer: Medicare HMO | Admitting: Adult Health

## 2022-06-24 ENCOUNTER — Encounter: Payer: Self-pay | Admitting: Adult Health

## 2022-06-24 ENCOUNTER — Encounter: Payer: Medicare HMO | Admitting: Dietician

## 2022-06-24 ENCOUNTER — Ambulatory Visit: Payer: Medicare HMO

## 2022-06-24 DIAGNOSIS — C9310 Chronic myelomonocytic leukemia not having achieved remission: Secondary | ICD-10-CM

## 2022-06-24 DIAGNOSIS — K5909 Other constipation: Secondary | ICD-10-CM

## 2022-06-24 DIAGNOSIS — I4891 Unspecified atrial fibrillation: Secondary | ICD-10-CM | POA: Diagnosis not present

## 2022-06-24 DIAGNOSIS — N3000 Acute cystitis without hematuria: Secondary | ICD-10-CM | POA: Diagnosis not present

## 2022-06-24 DIAGNOSIS — D696 Thrombocytopenia, unspecified: Secondary | ICD-10-CM

## 2022-06-24 DIAGNOSIS — K219 Gastro-esophageal reflux disease without esophagitis: Secondary | ICD-10-CM

## 2022-06-24 DIAGNOSIS — N400 Enlarged prostate without lower urinary tract symptoms: Secondary | ICD-10-CM

## 2022-06-24 DIAGNOSIS — E440 Moderate protein-calorie malnutrition: Secondary | ICD-10-CM

## 2022-06-24 LAB — PREPARE PLATELET PHERESIS: Unit division: 0

## 2022-06-24 LAB — BPAM PLATELET PHERESIS
Blood Product Expiration Date: 202311162359
ISSUE DATE / TIME: 202311151611
Unit Type and Rh: 5100

## 2022-06-24 MED ORDER — OXYCODONE HCL 5 MG PO TABS: 5.0000 mg | ORAL_TABLET | ORAL | 0 refills | Status: DC | PRN

## 2022-06-24 MED ORDER — OXYCODONE HCL 5 MG PO TABS: 5.0000 mg | ORAL_TABLET | ORAL | 0 refills | Status: AC | PRN

## 2022-06-24 NOTE — Progress Notes (Signed)
Location:  [place] Room Number: 134 Place of Service:  SNF (31)   CODE STATUS: full   Allergies  Allergen Reactions   Aspirin Other (See Comments)    Gi upset, can tolerate baby aspirin   Flomax [Tamsulosin] Nausea Only    Chief Complaint  Patient presents with   Hospitalization Follow-up    HPI:  He is a 88 year old man who has been hospitalized from 06-17-22 through 06-23-22. His medical history includes: atrial fibrillation; hypertension; gerd; cml. He presented to the ED with fatigue malaise myalgia and dyspnea for 24 hours prior to presentation. He had had no appetite; with unknown amount of weight loss. He had his chemo held due to his weight loss.  He was treated for klebsiella pneumoniae uti. He was started on rocephin  will need to complete omnicef he is immunocompromised due to his cml. He has an elevated d-dimer is a reactant with acute infection and cml. VQ scan was negative for pulmonary embolism. He was treated for dehydration with CKD stage 3a. He is here for short term rehab with his goal to return back home. Today he tells me that he feels "bad and weak". He is laying in bed trying to sleep. 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   Hypertension    Port-A-Cath in place 10/15/2021    Past Surgical History:  Procedure Laterality Date   CARPAL TUNNEL RELEASE Right    COLONOSCOPY N/A 09/19/2013   Procedure: COLONOSCOPY;  Surgeon: Rogene Houston, MD;  Location: [facility];  Service: Endoscopy;  Laterality: N/A;  830   FINGER AMPUTATION Left    4th and 5th   left eye blindness Left    PORTACATH PLACEMENT Right 10/16/2021   Procedure: INSERTION PORT-A-CATH;   Surgeon: Aviva Signs, MD;  Location: [facility] ORS;  Service: General;  Laterality: Right;   SVT ABLATION N/A 09/21/2018   Procedure: SVT ABLATION;  Surgeon: Constance Haw, MD;  Location: [place] [facility] LAB;  Service: Cardiovascular;  Laterality: N/A;    Social History   Socioeconomic History   Marital status: Widowed    Spouse name: Not on file   Number of children: Not on file   Years of education: Not on file   Highest education level: Not on file  Occupational History   Not on file  Tobacco Use   Smoking status: Former    Packs/day: 0.50    Types: Cigarettes    Start date: 08/09/1956    Quit date: 08/10/1979    Years since quitting: 42.9   Smokeless tobacco: Never  Vaping Use   Vaping Use: Never used  Substance and Sexual Activity   Alcohol use: No    Alcohol/week: 0.0 standard drinks of alcohol   Drug use: No   Sexual activity: Not Currently  Other Topics Concern   Not on file  Social History Narrative   Not on file   Social Determinants of Health   Financial Resource Strain: Not on file  Food Insecurity: No Food Insecurity (06/17/2022)   Hunger Vital Sign    Worried About Running Out of Food in the Last Year: Never true  Ran Out of Food in the Last Year: Never true  Transportation Needs: No Transportation Needs (06/17/2022)   PRAPARE - Hydrologist (Medical): No    Lack of Transportation (Non-Medical): No  Physical Activity: Not on file  Stress: Not on file  Social Connections: Not on file  Intimate Partner Violence: Not At Risk (06/17/2022)   Humiliation, Afraid, Rape, and Kick questionnaire    Fear of Current or Ex-Partner: No    Emotionally Abused: No    Physically Abused: No    Sexually Abused: No   Family History  Problem Relation Age of Onset   CAD Mother    CAD Father    Cancer Brother       VITAL SIGNS BP 125/64   Pulse 80   Temp 99.4 F (37.4 C)   Resp 20   Ht '5\' 9"'$  (1.753 m)   Wt 148 lb 8 oz (67.4 kg)    SpO2 96%   BMI 21.93 kg/m   Outpatient Encounter Medications as of 06/24/2022  Medication Sig Note   acetaminophen (TYLENOL) 325 MG tablet Take 2 tablets (650 mg total) by mouth every 6 (six) hours as needed for mild pain, fever or headache (or Fever >/= 101).    albuterol (VENTOLIN HFA) 108 (90 Base) MCG/ACT inhaler Inhale 2 puffs into the lungs every 6 (six) hours as needed for wheezing or shortness of breath.    alfuzosin (UROXATRAL) 10 MG 24 hr tablet Take 1 tablet (10 mg total) by mouth daily with breakfast.    cefdinir (OMNICEF) 300 MG capsule Take 1 capsule (300 mg total) by mouth 2 (two) times daily for 3 days.    feeding supplement (ENSURE ENLIVE / ENSURE PLUS) LIQD Take 237 mLs by mouth 2 (two) times daily between meals.    furosemide (LASIX) 20 MG tablet Take 1 tablet (20 mg total) by mouth daily as needed (take as needed every morning for ankle swelling).    levocetirizine (XYZAL) 5 MG tablet Take 5 mg by mouth daily.    megestrol (MEGACE) 400 MG/10ML suspension Take 10 mLs (400 mg total) by mouth 2 (two) times daily. 06/17/2022: Pt does not take regularly   ondansetron (ZOFRAN) 4 MG tablet Take 1 tablet (4 mg total) by mouth every 6 (six) hours as needed for nausea.    oxyCODONE (OXY IR/ROXICODONE) 5 MG immediate release tablet Take 1 tablet (5 mg total) by mouth every 4 (four) hours as needed for up to 6 days for moderate pain.    pantoprazole (PROTONIX) 40 MG tablet Take 1 tablet (40 mg total) by mouth daily.    polyethylene glycol (MIRALAX) 17 g packet Take 17 g by mouth daily.    potassium chloride SA (KLOR-CON M) 20 MEQ tablet Take 1 tablet (20 mEq total) by mouth daily.    senna-docusate (SENOKOT-S) 8.6-50 MG tablet Take 2 tablets by mouth 2 (two) times daily.    [DISCONTINUED] prochlorperazine (COMPAZINE) 10 MG tablet TAKE (1) TABLET BY MOUTH EVERY SIX HOURS AS NEEDED.    Facility-Administered Encounter Medications as of 06/24/2022  Medication   0.9 %  sodium chloride  infusion (Manually program via Guardrails IV Fluids)   acetaminophen (TYLENOL) 325 MG tablet   diphenhydrAMINE (BENADRYL) 25 mg capsule   palonosetron (ALOXI) 0.25 MG/5ML injection     SIGNIFICANT DIAGNOSTIC EXAMS  Review of Systems  Constitutional:  Positive for malaise/fatigue.  Respiratory:  Negative for cough and shortness of breath.   Cardiovascular:  Negative for chest pain, palpitations and leg swelling.  Gastrointestinal:  Negative for abdominal pain, constipation and heartburn.  Musculoskeletal:  Negative for back pain, joint pain and myalgias.  Skin: Negative.   Neurological:  Positive for weakness. Negative for dizziness.  Psychiatric/Behavioral:  The patient is not nervous/anxious.     Physical Exam Constitutional:      General: He is not in acute distress.    Appearance: He is well-developed. He is not diaphoretic.     Comments: thin  Neck:     Thyroid: No thyromegaly.  Cardiovascular:     Rate and Rhythm: Normal rate and regular rhythm.     Pulses: Normal pulses.     Heart sounds: Normal heart sounds.  Pulmonary:     Effort: Pulmonary effort is normal. No respiratory distress.     Breath sounds: Normal breath sounds.  Abdominal:     General: Bowel sounds are normal. There is no distension.     Palpations: Abdomen is soft.     Tenderness: There is no abdominal tenderness.  Musculoskeletal:        General: Normal range of motion.     Cervical back: Neck supple.     Right lower leg: No edema.     Left lower leg: No edema.  Lymphadenopathy:     Cervical: No cervical adenopathy.  Skin:    General: Skin is warm and dry.     Comments: Portacath   Neurological:     Mental Status: He is alert and oriented to person, place, and time.  Psychiatric:        Mood and Affect: Mood normal.       ASSESSMENT/ PLAN:  TODAY  Atrial fibrillation with rapid ventricular response: heart rate is stable; is not on anticoagulation therapy  2. Gastroesophageal reflux  disease: will continue protonix 40 mg daily   3. Acute cystitis without hematuria: will complete cefdinir 300 mg twice daily through 06-26-22  4. Thrombocytopenia: will monitor   5. 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 Malnutrition of moderate degree: will continue megace 400 mg twice daily   7. BPH without obstruction: will continue alfuzosin 10 mg daily   8. Chronic constipation: will continue senna s 2 tabs twice daily ; miralax daily;   Will monitor his status.    Ok Edwards NP [facility]  call [phone]

## 2022-06-25 ENCOUNTER — Other Ambulatory Visit (HOSPITAL_COMMUNITY)
Admission: RE | Admit: 2022-06-25 | Discharge: 2022-06-25 | Disposition: A | Discharge status: Home / Self Care | Payer: Medicare HMO | Source: Skilled Nursing Facility | Attending: Adult Health | Admitting: Adult Health

## 2022-06-25 ENCOUNTER — Ambulatory Visit: Payer: Medicare HMO

## 2022-06-25 ENCOUNTER — Inpatient Hospital Stay: Payer: Medicare HMO

## 2022-06-25 VITALS — BP 140/55 | HR 58 | Temp 98.2°F | Resp 20

## 2022-06-25 DIAGNOSIS — C9310 Chronic myelomonocytic leukemia not having achieved remission: Secondary | ICD-10-CM | POA: Diagnosis not present

## 2022-06-25 DIAGNOSIS — Z95828 Presence of other vascular implants and grafts: Secondary | ICD-10-CM

## 2022-06-25 DIAGNOSIS — D638 Anemia in other chronic diseases classified elsewhere: Secondary | ICD-10-CM | POA: Insufficient documentation

## 2022-06-25 DIAGNOSIS — D469 Myelodysplastic syndrome, unspecified: Secondary | ICD-10-CM

## 2022-06-25 LAB — CBC WITH DIFFERENTIAL/PLATELET
Abs Immature Granulocytes: 18.8 10*3/uL — ABNORMAL HIGH (ref 0.00–0.07)
Basophils Absolute: 0 10*3/uL (ref 0.0–0.1)
Basophils Relative: 0 %
Blasts: 24 %
Eosinophils Absolute: 0 10*3/uL (ref 0.0–0.5)
Eosinophils Relative: 0 %
HCT: 24.9 % — ABNORMAL LOW (ref 39.0–52.0)
Hemoglobin: 8.2 g/dL — ABNORMAL LOW (ref 13.0–17.0)
Lymphocytes Relative: 14 %
Lymphs Abs: 16.5 10*3/uL — ABNORMAL HIGH (ref 0.7–4.0)
MCH: 30.4 pg (ref 26.0–34.0)
MCHC: 32.9 g/dL (ref 30.0–36.0)
MCV: 92.2 fL (ref 80.0–100.0)
Metamyelocytes Relative: 9 %
Monocytes Absolute: 44.8 10*3/uL — ABNORMAL HIGH (ref 0.1–1.0)
Monocytes Relative: 38 %
Myelocytes: 3 %
Neutro Abs: 9.4 10*3/uL — ABNORMAL HIGH (ref 1.7–7.7)
Neutrophils Relative %: 8 %
Platelets: 21 10*3/uL — CL (ref 150–400)
Promyelocytes Relative: 4 %
RBC: 2.7 MIL/uL — ABNORMAL LOW (ref 4.22–5.81)
RDW: 17.2 % — ABNORMAL HIGH (ref 11.5–15.5)
WBC: 117.8 10*3/uL (ref 4.0–10.5)
nRBC: 0.2 % (ref 0.0–0.2)

## 2022-06-25 LAB — COMPREHENSIVE METABOLIC PANEL
ALT: 46 U/L — ABNORMAL HIGH (ref 0–44)
AST: 36 U/L (ref 15–41)
Albumin: 2.2 g/dL — ABNORMAL LOW (ref 3.5–5.0)
Alkaline Phosphatase: 105 U/L (ref 38–126)
Anion gap: 10 (ref 5–15)
BUN: 30 mg/dL — ABNORMAL HIGH (ref 8–23)
CO2: 20 mmol/L — ABNORMAL LOW (ref 22–32)
Calcium: 8.5 mg/dL — ABNORMAL LOW (ref 8.9–10.3)
Chloride: 118 mmol/L — ABNORMAL HIGH (ref 98–111)
Creatinine, Ser: 1.47 mg/dL — ABNORMAL HIGH (ref 0.61–1.24)
GFR, Estimated: 46 mL/min — ABNORMAL LOW (ref >60)
Glucose, Bld: 94 mg/dL (ref 70–99)
Potassium: 3.5 mmol/L (ref 3.5–5.1)
Sodium: 148 mmol/L — ABNORMAL HIGH (ref 135–145)
Total Bilirubin: 0.9 mg/dL (ref 0.3–1.2)
Total Protein: 5.9 g/dL — ABNORMAL LOW (ref 6.5–8.1)

## 2022-06-25 LAB — SAMPLE TO BLOOD BANK

## 2022-06-25 MED ORDER — HEPARIN SOD (PORK) LOCK FLUSH 100 UNIT/ML IV SOLN
500.0000 [IU] | Freq: Once | INTRAVENOUS | Status: AC
  Administered 2022-06-25: 500 [IU] via INTRAVENOUS

## 2022-06-25 MED ORDER — SODIUM CHLORIDE 0.9% FLUSH
10.0000 mL | INTRAVENOUS | Status: DC | PRN
  Administered 2022-06-25: 10 mL via INTRAVENOUS

## 2022-06-25 NOTE — Progress Notes (Signed)
Patients port flushed without difficulty.  Good blood return noted with no bruising or swelling noted at site.  Patient remains accessed for possible blood transfusion.

## 2022-06-25 NOTE — Patient Instructions (Signed)
[facility] [place]  Discharge Instructions: Thank you for choosing [facility] to provide your oncology and hematology care.  If you have a lab appointment with the Cancer Center, please come in thru the Main Entrance and check in at the main information desk.  Wear comfortable clothing and clothing appropriate for easy access to any Portacath or PICC line.   We strive to give you quality time with your provider. You may need to reschedule your appointment if you arrive late (15 or more minutes).  Arriving late affects you and other patients whose appointments are after yours.  Also, if you miss three or more appointments without notifying the office, you may be dismissed from the clinic at the provider's discretion.      For prescription refill requests, have your pharmacy contact our office and allow 72 hours for refills to be completed.     To help prevent nausea and vomiting after your treatment, we encourage you to take your nausea medication as directed.  BELOW ARE SYMPTOMS THAT SHOULD BE REPORTED IMMEDIATELY: *FEVER GREATER THAN 100.4 F (38 C) OR HIGHER *CHILLS OR SWEATING *NAUSEA AND VOMITING THAT IS NOT CONTROLLED WITH YOUR NAUSEA MEDICATION *UNUSUAL SHORTNESS OF BREATH *UNUSUAL BRUISING OR BLEEDING *URINARY PROBLEMS (pain or burning when urinating, or frequent urination) *BOWEL PROBLEMS (unusual diarrhea, constipation, pain near the anus) TENDERNESS IN MOUTH AND THROAT WITH OR WITHOUT PRESENCE OF ULCERS (sore throat, sores in mouth, or a toothache) UNUSUAL RASH, SWELLING OR PAIN  UNUSUAL VAGINAL DISCHARGE OR ITCHING   Items with * indicate a potential emergency and should be followed up as soon as possible or go to the Emergency Department if any problems should occur.  Please show the CHEMOTHERAPY ALERT CARD or IMMUNOTHERAPY ALERT CARD at check-in to the Emergency Department and triage nurse.  Should you have questions after your visit or need to  cancel or reschedule your appointment, please contact [facility] [place] [phone]  and follow the prompts.  Office hours are 8:00 a.m. to 4:30 p.m. Monday - Friday. Please note that voicemails left after 4:00 p.m. may not be returned until the following business day.  We are closed weekends and major holidays. You have access to a nurse at all times for urgent questions. Please call the main number to the clinic [phone] and follow the prompts.  For any non-urgent questions, you may also contact your provider using MyChart. We now offer e-Visits for anyone 18 and older to request care online for non-urgent symptoms. For details visit [email or web address].   Also download the MyChart app! Go to the app store, search "MyChart", open the app, select [place], and log in with your MyChart username and password.  Masks are optional in the cancer centers. If you would like for your care team to wear a mask while they are taking care of you, please let them know. You may have one support person who is at least 86 years old accompany you for your appointments.  

## 2022-06-25 NOTE — Progress Notes (Signed)
CRITICAL VALUE ALERT Critical value received:  platelets 21.  Date of notification:  06-25-2022 Time of notification: 09:48 am.  Critical value read back:  Yes.   Nurse who received alert:  B.Ernestyne Caldwell RN MD notified time and response:  R. Pennington PA '@09'$ :59 am.    CRITICAL VALUE ALERT Critical value received:  WBC 117.8 Date of notification:  06-25-2022 Time of notification: 10:32 am  Critical value read back:  Yes.   Nurse who received alert:  B.Nohely Whitehorn RN.  MD notified time and response:  R. Pennington PA @ 10:34 am.

## 2022-06-25 NOTE — Progress Notes (Signed)
Patient presents today for blood transfusion. Patient is very lethargic and SOB today.  He was recently hospitalized for UTI/sepsis.  Patient was put on 3 L of oxygen.  Vital signs are stable.    Hemoglobin today is 8.1 and platelets are 21.  No need to transfuse blood or platelets today per Tarri Abernethy, PA-C and standing orders per Dr. Delton Coombes.  Patients port flushed without difficulty.  Good blood return noted with no bruising or swelling noted at site.  Band aid applied. Patient discharged in stable condition via wheelchair with [facility] staff to the [facility].

## 2022-06-28 ENCOUNTER — Other Ambulatory Visit: Payer: Medicare HMO

## 2022-06-28 ENCOUNTER — Inpatient Hospital Stay: Payer: Medicare HMO | Admitting: Dietician

## 2022-06-28 ENCOUNTER — Ambulatory Visit: Payer: Medicare HMO | Admitting: Hematology

## 2022-06-28 ENCOUNTER — Encounter: Payer: Self-pay | Admitting: Internal Medicine

## 2022-06-28 ENCOUNTER — Inpatient Hospital Stay: Payer: Medicare HMO

## 2022-06-28 ENCOUNTER — Ambulatory Visit: Payer: Medicare HMO

## 2022-06-28 ENCOUNTER — Non-Acute Institutional Stay (SKILLED_NURSING_FACILITY): Payer: Medicare HMO | Admitting: Internal Medicine

## 2022-06-28 ENCOUNTER — Inpatient Hospital Stay (HOSPITAL_BASED_OUTPATIENT_CLINIC_OR_DEPARTMENT_OTHER): Payer: Medicare HMO | Admitting: Hematology

## 2022-06-28 VITALS — BP 113/77 | HR 74 | Temp 98.2°F | Resp 20 | Ht 69.0 in | Wt 139.7 lb

## 2022-06-28 DIAGNOSIS — D696 Thrombocytopenia, unspecified: Secondary | ICD-10-CM | POA: Insufficient documentation

## 2022-06-28 DIAGNOSIS — C9310 Chronic myelomonocytic leukemia not having achieved remission: Secondary | ICD-10-CM | POA: Diagnosis not present

## 2022-06-28 DIAGNOSIS — N3000 Acute cystitis without hematuria: Secondary | ICD-10-CM

## 2022-06-28 DIAGNOSIS — R7989 Other specified abnormal findings of blood chemistry: Secondary | ICD-10-CM

## 2022-06-28 DIAGNOSIS — E440 Moderate protein-calorie malnutrition: Secondary | ICD-10-CM

## 2022-06-28 DIAGNOSIS — D469 Myelodysplastic syndrome, unspecified: Secondary | ICD-10-CM

## 2022-06-28 LAB — SAMPLE TO BLOOD BANK

## 2022-06-28 LAB — COMPREHENSIVE METABOLIC PANEL
ALT: 31 U/L (ref 0–44)
AST: 28 U/L (ref 15–41)
Albumin: 2.3 g/dL — ABNORMAL LOW (ref 3.5–5.0)
Alkaline Phosphatase: 104 U/L (ref 38–126)
Anion gap: 5 (ref 5–15)
BUN: 27 mg/dL — ABNORMAL HIGH (ref 8–23)
CO2: 19 mmol/L — ABNORMAL LOW (ref 22–32)
Calcium: 8.5 mg/dL — ABNORMAL LOW (ref 8.9–10.3)
Chloride: 120 mmol/L — ABNORMAL HIGH (ref 98–111)
Creatinine, Ser: 1.61 mg/dL — ABNORMAL HIGH (ref 0.61–1.24)
GFR, Estimated: 41 mL/min — ABNORMAL LOW (ref >60)
Glucose, Bld: 117 mg/dL — ABNORMAL HIGH (ref 70–99)
Potassium: 3.7 mmol/L (ref 3.5–5.1)
Sodium: 144 mmol/L (ref 135–145)
Total Bilirubin: 0.5 mg/dL (ref 0.3–1.2)
Total Protein: 6.7 g/dL (ref 6.5–8.1)

## 2022-06-28 LAB — CBC WITH DIFFERENTIAL/PLATELET
Abs Immature Granulocytes: 4.1 10*3/uL — ABNORMAL HIGH (ref 0.00–0.07)
Basophils Absolute: 0 10*3/uL (ref 0.0–0.1)
Basophils Relative: 0 %
Blasts: 8 %
Eosinophils Absolute: 0 10*3/uL (ref 0.0–0.5)
Eosinophils Relative: 0 %
HCT: 22.6 % — ABNORMAL LOW (ref 39.0–52.0)
Hemoglobin: 7.4 g/dL — ABNORMAL LOW (ref 13.0–17.0)
Lymphocytes Relative: 11 %
Lymphs Abs: 15.1 10*3/uL — ABNORMAL HIGH (ref 0.7–4.0)
MCH: 30.5 pg (ref 26.0–34.0)
MCHC: 32.7 g/dL (ref 30.0–36.0)
MCV: 93 fL (ref 80.0–100.0)
Metamyelocytes Relative: 2 %
Monocytes Absolute: 78.5 10*3/uL — ABNORMAL HIGH (ref 0.1–1.0)
Monocytes Relative: 57 %
Myelocytes: 1 %
Neutro Abs: 28.9 10*3/uL — ABNORMAL HIGH (ref 1.7–7.7)
Neutrophils Relative %: 21 %
Platelets: 14 10*3/uL — CL (ref 150–400)
RBC: 2.43 MIL/uL — ABNORMAL LOW (ref 4.22–5.81)
RDW: 17.3 % — ABNORMAL HIGH (ref 11.5–15.5)
WBC: 137.7 10*3/uL (ref 4.0–10.5)
nRBC: 0.3 % — ABNORMAL HIGH (ref 0.0–0.2)

## 2022-06-28 MED ORDER — SODIUM CHLORIDE 0.9% FLUSH
10.0000 mL | INTRAVENOUS | Status: AC
  Administered 2022-06-28: 10 mL

## 2022-06-28 MED ORDER — HEPARIN SOD (PORK) LOCK FLUSH 100 UNIT/ML IV SOLN
500.0000 [IU] | Freq: Once | INTRAVENOUS | Status: AC
  Administered 2022-06-28: 500 [IU] via INTRAVENOUS

## 2022-06-28 NOTE — Patient Instructions (Signed)
[facility] [place]  Discharge Instructions: Thank you for choosing [facility] to provide your oncology and hematology care.  If you have a lab appointment with the Cancer Center, please come in thru the Main Entrance and check in at the main information desk.  Wear comfortable clothing and clothing appropriate for easy access to any Portacath or PICC line.   We strive to give you quality time with your provider. You may need to reschedule your appointment if you arrive late (15 or more minutes).  Arriving late affects you and other patients whose appointments are after yours.  Also, if you miss three or more appointments without notifying the office, you may be dismissed from the clinic at the provider's discretion.      For prescription refill requests, have your pharmacy contact our office and allow 72 hours for refills to be completed.    Today you received the following chemotherapy and/or immunotherapy agents port flush lab draw.      To help prevent nausea and vomiting after your treatment, we encourage you to take your nausea medication as directed.  BELOW ARE SYMPTOMS THAT SHOULD BE REPORTED IMMEDIATELY: *FEVER GREATER THAN 100.4 F (38 C) OR HIGHER *CHILLS OR SWEATING *NAUSEA AND VOMITING THAT IS NOT CONTROLLED WITH YOUR NAUSEA MEDICATION *UNUSUAL SHORTNESS OF BREATH *UNUSUAL BRUISING OR BLEEDING *URINARY PROBLEMS (pain or burning when urinating, or frequent urination) *BOWEL PROBLEMS (unusual diarrhea, constipation, pain near the anus) TENDERNESS IN MOUTH AND THROAT WITH OR WITHOUT PRESENCE OF ULCERS (sore throat, sores in mouth, or a toothache) UNUSUAL RASH, SWELLING OR PAIN  UNUSUAL VAGINAL DISCHARGE OR ITCHING   Items with * indicate a potential emergency and should be followed up as soon as possible or go to the Emergency Department if any problems should occur.  Please show the CHEMOTHERAPY ALERT CARD or IMMUNOTHERAPY ALERT CARD at check-in to  the Emergency Department and triage nurse.  Should you have questions after your visit or need to cancel or reschedule your appointment, please contact [facility] AT  [phone]  and follow the prompts.  Office hours are 8:00 a.m. to 4:30 p.m. Monday - Friday. Please note that voicemails left after 4:00 p.m. may not be returned until the following business day.  We are closed weekends and major holidays. You have access to a nurse at all times for urgent questions. Please call the main number to the clinic [phone] and follow the prompts.  For any non-urgent questions, you may also contact your provider using MyChart. We now offer e-Visits for anyone 18 and older to request care online for non-urgent symptoms. For details visit [email or web address].   Also download the MyChart app! Go to the app store, search "MyChart", open the app, select [place], and log in with your MyChart username and password.  Masks are optional in the cancer centers. If you would like for your care team to wear a mask while they are taking care of you, please let them know. You may have one support person who is at least 86 years old accompany you for your appointments.  

## 2022-06-28 NOTE — Progress Notes (Signed)
Nutrition Follow-up:  Patient with chronic myelomonocytic leukemia. He is receiving azacitidine q28d. Treatment currently on hold.  11/9-11/15 Hospital admission - Klebsiella UTI. Pt discharged to SNF  Patient seen by Dr. Delton Coombes today to discuss bone marrow biopsy results. Per notes, pt now with progression to acute leukemia. Patient too weak to receive treatment. It was recommended that pt transition to comfort care with hospice. Patient left to return to nursing facility prior to nutrition visit.    NEXT VISIT: No follow-up scheduled. Please consult RD if nutrition concerns arise

## 2022-06-28 NOTE — Patient Instructions (Signed)
See assessment and plan under each diagnosis in the problem list and acutely for this visit 

## 2022-06-28 NOTE — Patient Instructions (Signed)
[facility] at [facility] Discharge Instructions   You were seen and examined today by Dr. Katragadda.  He discussed with you the results of the bone marrow biopsy. 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   You are too weak to receive treatment for this condition. Dr. K recommends you receive comfort care in the form of hospice. This can be done in your home if there is someone to stay with you. Or if your son wants you to stay with him it could be provided in his home.    Thank you for choosing [facility] at [facility] to provide your oncology and hematology care.  To afford each patient quality time with our provider, please arrive at least 15 minutes before your scheduled appointment time.   If you have a lab appointment with the Cancer Center please come in thru the Main Entrance and check in at the main information desk.  You need to re-schedule your appointment should you arrive 10 or more minutes late.  We strive to give you quality time with our providers, and arriving late affects you and other patients whose appointments are after yours.  Also, if you no show three or more times for appointments you may be dismissed from the clinic at the providers discretion.     Again, thank you for choosing [facility].  Our hope is that these requests will decrease the amount of time that you wait before being seen by our physicians.       _____________________________________________________________  Should you have questions after your visit to [facility], please contact our office at [phone] and follow the prompts.  Our office hours are 8:00 a.m. and 4:30 p.m. Monday - Friday.  Please note that voicemails left after 4:00 p.m. may not be returned until the following business day.  We are closed weekends and major holidays.  You do have access to a nurse 24-7, just call the main number to  the clinic [phone] and do not press any options, hold on the line and a nurse will answer the phone.    For prescription refill requests, have your pharmacy contact our office and allow 72 hours.    Due to Covid, you will need to wear a mask upon entering the hospital. If you do not have a mask, a mask will be given to you at the Main Entrance upon arrival. For doctor visits, patients may have 1 support person age 18 or older with them. For treatment visits, patients can not have anyone with them due to social distancing guidelines and our immunocompromised population.      

## 2022-06-28 NOTE — Assessment & Plan Note (Addendum)
As per Dr. Delton Coombes.  No active bleeding dyscrasias noted at this time other than self-limited epistaxis.

## 2022-06-28 NOTE — Assessment & Plan Note (Signed)
He continues to complain of diffuse thoracic pain with inspiration with associated right upper quadrant discomfort.  There is also discomfort to palpation over the costochondral margins and with compression of the thorax. This may represent atypical pleurisy,costochondritis or myalgia. CK will be considered if persistent or progressive. He is not on a statin.Marland Kitchen

## 2022-06-28 NOTE — Assessment & Plan Note (Signed)
S/P Rocephin with transition to oral Omnicef for Klebsiella pneumonia UTI.  Presently asymptomatic and afebrile.

## 2022-06-28 NOTE — Progress Notes (Signed)
CRITICAL VALUE ALERT Critical value received:  WBC 137.7 Platelets 14 Date of notification:  06-28-2022 Time of notification: 12:08 pm.  Critical value read back:  Yes.   Nurse who received alert:  B. Joshaua Epple RN MD notified time and response:  Dr. Delton Coombes @ 12:23 pm. Patient to return tomorrow for 1 unit of platelets per A. Anderson RN/ Dr. Delton Coombes.   Dustin Tucker presented for Portacath access and flush.  Portacath located right chest wall accessed with  H 20 needle.  Good blood return present. Portacath flushed with 46m NS and 500U/521mHeparin and needle removed intact.  Procedure tolerated well and without incident.  Treatment given today per MD orders. No complaints at this time. Discharged from clinic by wheel chair in stable condition. Alert and oriented x 3. [facility].

## 2022-06-28 NOTE — Progress Notes (Unsigned)
NURSING HOME LOCATION:  [facility] ROOM NUMBER:  [id number]  CODE STATUS:  Full Code  PCP: Jani Gravel MD  This is a comprehensive admission note to this SNFperformed on this date less than 30 days from date of admission. Included are preadmission medical/surgical history; reconciled medication list; family history; social history and comprehensive review of systems.  Corrections and additions to the records were documented. Comprehensive physical exam was also performed. Additionally a clinical summary was entered for each active diagnosis pertinent to this admission in the Problem List to enhance continuity of care.  HPI: He was hospitalized 11/9/-06/23/2022 presenting with chief complaint of fatigue, malaise, myalgias, dyspnea.  The symptoms began approximately 24 hours PTA.  The myalgias were described as the most bothersome.  This complex of symptoms was associated with generalized weakness and anorexia. Patient also had dysuria without other GU signs or symptoms.  Culture revealed Klebsiella pneumonia UTI for which he received IV Rocephin with transition to oral Omnicef. D-dimer which was elevated at 4.83.  VQ scan was negative for PE. Clinically he was dehydrated although there was only mild elevation of the BUN to 26.  Creatinine was 1.53 with a GFR of 43 indicating high stage IIIb CKD.  With subsequent rehydration final creatinine was 1.48 and GFR 45 borderline stage B/3A. Presentation H/H was 9/26.8.  White blood count was 33,000.  Initial CBC revealed clumping of platelets; nadir platelet count was 13,000.  Prior to discharge H/H was 8.4/25.7; white count 70,500; and platelet count 14,000 after transfusion of 1 unit of platelets. Moderate protein/caloric malnutrition was felt to be present with albumin of 2.4 and total protein of 6.3. Hospital course was complicated by constipation and abdominal distention; there was not sufficient ascites to allow paracentesis.  He  received an enema and as needed laxatives. 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  Azacitidine therapy was held and follow-up scheduled with Dr. Delton Coombes on 11/17 for repeat CBC and reevaluation. Because of his advanced age, co-morbidities and debilitation; SNF placement was recommended for rehab.  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 Surgeries and procedures include carpal tunnel release, colonoscopy, finger digit amputations, and SVT ablation.  Social history: Nondrinker; former-smoker.  Family history: Noncontributory due to advanced age.   Review of systems: His chief complaint is diffuse thoracic and right upper quadrant discomfort when he takes a deep breath.  He states that he did have some epistaxis on 11/19.  This apparently was self-limited. Dr. Delton Coombes has seen him following discharge and recommends against aggressive therapy.  Hospice consultation was recommended and will be discussed with his son.  Constitutional: No fever  Eyes: No redness, discharge, pain, vision change ENT/mouth: No nasal congestion, purulent discharge, earache, change in hearing, sore throat  Cardiovascular: No palpitations, paroxysmal nocturnal dyspnea, claudication, edema  Respiratory: No cough, sputum production, hemoptysis, significant snoring, apnea  Gastrointestinal: No heartburn, dysphagia, nausea /vomiting, rectal bleeding, melena, change in bowels Genitourinary: No dysuria, hematuria, pyuria, incontinence, nocturia Musculoskeletal: No joint stiffness, joint swelling Dermatologic: No rash, pruritus, change in appearance of skin Neurologic: No dizziness, headache, syncope, seizures, numbness, tingling Psychiatric: No significant anxiety, depression, insomnia, anorexia Endocrine: No  change in hair/skin/nails, excessive thirst, excessive hunger, excessive urination  Hematologic/lymphatic: No significant bruising, lymphadenopathy,  Physical exam:  Pertinent or positive findings: He was  initially asleep when I entered the room and exhibited hypopnea without snoring or frank apnea.  He appeared grossly cachectic.  He is wearing nasal oxygen.  Pattern alopecia is noted.  There is temporal wasting.  Eyebrows are thin especially on the left.  Upon awakening he did exhibit exotropia of the left eye.  He is blind in the left eye.  Complete dentures are present.  There is a linear reddish tint around the mouth mimicking lipstick.  Apparently this is a moisturizer he has been employing.  An irregular gallop is present.  Ribs are visible.  Rales are noted at the right lower lobe greater than the left with some bronchovesicular quality to breath sounds superiorly.  He was tender to palpation over the costochondral margins and also with compression of the thorax and palpation of the right upper quadrant.  I cannot appreciate definite hepatomegaly.  1/2+ edema is noted at the sock line.  Pedal pulses are decreased.  Limbs are thin and interosseous wasting is noted.  Clubbing of the nailbeds is noted.  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  There is partial contractures of the third left finger and this second and fifth right fingers.  General appearance: no acute distress, increased work of breathing is present.   Lymphatic: No lymphadenopathy about the head, neck, axilla. Eyes: No conjunctival inflammation or lid edema is present. There is no scleral icterus. Ears:  External ear exam shows no significant lesions or deformities.   Nose:  External nasal examination shows no deformity or inflammation. Nasal mucosa are pink and moist without lesions, exudates Neck:  No thyromegaly, masses, tenderness noted.    Heart:  No murmur, click, rub.  Lungs: without wheezes, rhonchi,  rubs. Abdomen: Bowel sounds are normal.  Abdomen is soft and nontender with no organomegaly, hernias, masses. GU: Deferred  Extremities:  No cyanosis Neurologic exam:  Balance, Rhomberg, finger to nose testing could not be completed due to clinical state Skin: Warm & dry w/o tenting. No significant lesions or rash.  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 Prognosis related to these discussed frankly. All questions answered. [facility]osp[facility] care @ [facility] recommended.

## 2022-06-28 NOTE — Progress Notes (Signed)
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  INTERVAL HISTORY:  [name], a 89 y.o. male, seen for follow-up of CMML.  He had bone marrow biopsy done on 06/15/2022.  He is currently residing at [facility] after recent hospitalization.  REVIEW OF SYSTEMS:  Review of Systems  HENT:   Positive for nosebleeds and trouble swallowing.   Respiratory:  Positive for cough and shortness of breath.   Cardiovascular:  Positive for chest pain.  Neurological:  Positive for dizziness and headaches.  Psychiatric/Behavioral:  Positive for sleep disturbance.   All other systems reviewed and are negative.   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   Hypertension    Port-A-Cath in place 10/15/2021   Past Surgical History:  Procedure Laterality Date   CARPAL TUNNEL RELEASE Right    COLONOSCOPY N/A 09/19/2013   Procedure: COLONOSCOPY;  Surgeon: Rogene Houston, MD;  Location: [facility];  Service: Endoscopy;  Laterality: N/A;   830   FINGER AMPUTATION Left    4th and 5th   left eye blindness Left    PORTACATH PLACEMENT Right 10/16/2021   Procedure: INSERTION PORT-A-CATH;  Surgeon: Aviva Signs, MD;  Location: [facility];  Service: General;  Laterality: Right;   SVT ABLATION N/A 09/21/2018   Procedure: SVT ABLATION;  Surgeon: Constance Haw, MD;  Location: [place] [facility];  Service: Cardiovascular;  Laterality: N/A;    SOCIAL HISTORY:  Social History   Socioeconomic History   Marital status: Widowed    Spouse name: Not on file   Number of children: Not on file   Years of education: Not on file   Highest education level: Not on file  Occupational History   Not on file  Tobacco Use   Smoking status: Former    Packs/day: 0.50    Types: Cigarettes    Start date: 08/09/1956    Quit date: 08/10/1979    Years since quitting: 42.9   Smokeless tobacco: Never  Vaping Use   Vaping Use: Never used  Substance and Sexual Activity   Alcohol use: No    Alcohol/week: 0.0 standard drinks of alcohol   Drug use: No   Sexual activity: Not Currently  Other Topics Concern   Not on file  Social History Narrative   Not on file   Social Determinants of Health   Financial Resource Strain: Not on file  Food Insecurity: No Food Insecurity (06/17/2022)   Hunger Vital Sign  Worried About Charity fundraiser in the Last Year: Never true    [place] in the Last Year: Never true  Transportation Needs: No Transportation Needs (06/17/2022)   PRAPARE - Hydrologist (Medical): No    Lack of Transportation (Non-Medical): No  Physical Activity: Not on file  Stress: Not on file  Social Connections: Not on file  Intimate Partner Violence: Not At Risk (06/17/2022)   Humiliation, Afraid, Rape, and Kick questionnaire    Fear of Current or Ex-Partner: No    Emotionally Abused: No    Physically Abused: No    Sexually Abused: No    FAMILY HISTORY:  Family History  Problem Relation Age of  Onset   CAD Mother    CAD Father    Cancer Brother     CURRENT MEDICATIONS:  Current Outpatient Medications  Medication Sig Dispense Refill   acetaminophen (TYLENOL) 325 MG tablet Take 2 tablets (650 mg total) by mouth every 6 (six) hours as needed for mild pain, fever or headache (or Fever >/= 101). 100 tablet 0   albuterol (VENTOLIN HFA) 108 (90 Base) MCG/ACT inhaler Inhale 2 puffs into the lungs every 6 (six) hours as needed for wheezing or shortness of breath. 18 g 3   alfuzosin (UROXATRAL) 10 MG 24 hr tablet Take 1 tablet (10 mg total) by mouth daily with breakfast. 30 tablet 11   feeding supplement (ENSURE ENLIVE / ENSURE PLUS) LIQD Take 237 mLs by mouth 2 (two) times daily between meals. 237 mL 12   furosemide (LASIX) 20 MG tablet Take 1 tablet (20 mg total) by mouth daily as needed (take as needed every morning for ankle swelling). 30 tablet 2   levocetirizine (XYZAL) 5 MG tablet Take 5 mg by mouth daily.     megestrol (MEGACE) 400 MG/10ML suspension Take 10 mLs (400 mg total) by mouth 2 (two) times daily. 480 mL 3   oxyCODONE (OXY IR/ROXICODONE) 5 MG immediate release tablet Take 1 tablet (5 mg total) by mouth every 4 (four) hours as needed for up to 6 days for moderate pain. 30 tablet 0   pantoprazole (PROTONIX) 40 MG tablet Take 1 tablet (40 mg total) by mouth daily. 30 tablet 1   polyethylene glycol (MIRALAX) 17 g packet Take 17 g by mouth daily. 30 each 2   potassium chloride SA (KLOR-CON M) 20 MEQ tablet Take 1 tablet (20 mEq total) by mouth daily. 30 tablet 1   senna-docusate (SENOKOT-S) 8.6-50 MG tablet Take 2 tablets by mouth 2 (two) times daily. 120 tablet 1   ondansetron (ZOFRAN) 4 MG tablet Take 1 tablet (4 mg total) by mouth every 6 (six) hours as needed for nausea. (Patient not taking: Reported on 06/28/2022) 20 tablet 0   No current facility-administered medications for this visit.   Facility-Administered Medications Ordered in Other Visits  Medication Dose Route  Frequency Provider Last Rate Last Admin   0.9 %  sodium chloride infusion (Manually program via Guardrails IV Fluids)  250 mL Intravenous Once Derek Jack, MD       acetaminophen (TYLENOL) 325 MG tablet            diphenhydrAMINE (BENADRYL) 25 mg capsule            palonosetron (ALOXI) 0.25 MG/5ML injection             ALLERGIES:  Allergies  Allergen Reactions   Aspirin Other (See Comments)    Gi  upset, can tolerate baby aspirin   Flomax [Tamsulosin] Nausea Only    PHYSICAL EXAM:  Performance status (ECOG): 1 - Symptomatic but completely ambulatory  Vitals:   06/28/22 1033  BP: 113/77  Pulse: 74  Resp: 20  Temp: 98.2 F (36.8 C)  SpO2: 96%     Wt Readings from Last 3 Encounters:  06/28/22 139 lb 9.6 oz (63.3 kg)  06/28/22 139 lb 11.2 oz (63.4 kg)  06/24/22 148 lb 8 oz (67.4 kg)   Physical Exam Vitals reviewed.  Constitutional:      Appearance: Normal appearance.  Cardiovascular:     Rate and Rhythm: Normal rate and regular rhythm.     Pulses: Normal pulses.     Heart sounds: Normal heart sounds.  Pulmonary:     Effort: Pulmonary effort is normal.     Breath sounds: Normal breath sounds.  Abdominal:     Palpations: Abdomen is soft. There is no mass.     Tenderness: There is no abdominal tenderness.  Musculoskeletal:     Right lower leg: 1+ Edema present.     Left lower leg: 1+ Edema present.  Neurological:     General: No focal deficit present.     Mental Status: He is alert and oriented to person, place, and time.  Psychiatric:        Mood and Affect: Mood normal.        Behavior: Behavior normal.     LABORATORY DATA:  I have reviewed the labs as listed.     Latest Ref Rng & Units 06/28/2022   10:56 AM 06/25/2022    9:40 AM 06/23/2022    4:45 AM  CBC  WBC 4.0 - 10.5 K/uL 137.7  117.8  70.5   Hemoglobin 13.0 - 17.0 g/dL 7.4  8.2  8.4   Hematocrit 39.0 - 52.0 % 22.6  24.9  25.7   Platelets 150 - 400 K/uL _0 Latest Ref Rng  & Units 06/28/2022   10:56 AM 06/25/2022    7:50 AM 06/23/2022    4:45 AM  CMP  Glucose 70 - 99 mg/dL 117  94  115   BUN 8 - 23 mg/dL _1 Creatinine 0.61 - 1.24 mg/dL 1.61  1.47  1.48   Sodium 135 - 145 mmol/L 144  148  144   Potassium 3.5 - 5.1 mmol/L 3.7  3.5  3.7   Chloride 98 - 111 mmol/L 120  118  118   CO2 22 - 32 mmol/L _2 Calcium 8.9 - 10.3 mg/dL 8.5  8.5  8.5   Total Protein 6.5 - 8.1 g/dL 6.7  5.9    Total Bilirubin 0.3 - 1.2 mg/dL 0.5  0.9    Alkaline Phos 38 - 126 U/L 104  105    AST 15 - 41 U/L 28  36    ALT 0 - 44 U/L 31  46      DIAGNOSTIC IMAGING:  I have independently reviewed the scans and discussed with the patient. DG Abd 1 View  Result Date: 06/23/2022 CLINICAL DATA:  [id number] Abdominal distention [id number] EXAM: ABDOMEN - 1 VIEW COMPARISON:  Ultrasound abdomen 06/21/2022, CT abdomen pelvis 06/21/2016 FINDINGS: Gaseous dilatation of the large bowel. Limited evaluation of the small bowel due to overlying large bowel. Stool within the rectum. Question free gas in the right upper quadrant with limited evaluation  on this supine radiograph. Limited evaluation for free gas on this supine radiograph. No radio-opaque calculi or other significant radiographic abnormality are seen. IMPRESSION: Gaseous dilatation of the large bowel.  Stool within the rectum. Electronically Signed   By: Iven Finn M.D.   On: 06/23/2022 00:55   [place] ASCITES (ABDOMEN LIMITED)  Result Date: 06/21/2022 CLINICAL DATA:  Ascites EXAM: LIMITED ABDOMEN ULTRASOUND FOR ASCITES TECHNIQUE: Limited ultrasound survey for ascites was performed in all four abdominal quadrants. COMPARISON:  None Available. FINDINGS: No ascites identified. IMPRESSION: No ascites. Electronically Signed   By: Lavonia Dana M.D.   On: 06/21/2022 11:42   NM Pulmonary Perfusion  Result Date: 06/18/2022 CLINICAL DATA:  Evaluate for pulmonary embolism. History of shortness of breath for 1 week. EXAM: NUCLEAR MEDICINE  PERFUSION LUNG SCAN TECHNIQUE: Perfusion images were obtained in multiple projections after intravenous injection of radiopharmaceutical. Ventilation scans intentionally deferred if perfusion scan and chest x-ray adequate for interpretation during COVID 19 epidemic. RADIOPHARMACEUTICALS:  Chest radiograph from 06/18/2022 mCi Tc-42mMAA IV COMPARISON:  None Available. FINDINGS: No significant peripheral segmental perfusion defects identified to suggest an acute pulmonary embolus. Soft tissue attenuation artifact secondary to upper extremities identified on the lateral projection images. IMPRESSION: 1. No evidence for pulmonary embolus. Electronically Signed   By: TKerby MoorsM.D.   On: 06/18/2022 08:44   DG CHEST PORT 1 VIEW  Result Date: 06/18/2022 CLINICAL DATA:  Dyspnea EXAM: PORTABLE CHEST 1 VIEW COMPARISON:  06/17/2022. FINDINGS: Heart is enlarged and the mediastinal contour is stable. There is atherosclerotic calcification of the aorta. Chronic elevation of the of the left diaphragm with atelectasis at the left lung base. No effusion or pneumothorax. A right chest port is stable in appearance. No acute osseous abnormality IMPRESSION: Chronic elevation of the left diaphragm with atelectasis at the left lung base. Electronically Signed   By: LBrett FairyM.D.   On: 06/18/2022 04:49   DG Chest Port 1 View  Result Date: 06/17/2022 CLINICAL DATA:  Shortness of breath. EXAM: PORTABLE CHEST 1 VIEW COMPARISON:  Radiograph 10/16/2021 FINDINGS: Accessed right chest port in place. Chronic elevation of right hemidiaphragm. Stable mild cardiomegaly with unchanged mediastinal contours. No acute airspace disease, pleural effusion or pneumothorax. Stable osseous structures. IMPRESSION: 1. No acute abnormality. Stable mild cardiomegaly. 2. Chronic elevation of right hemidiaphragm. Electronically Signed   By: MKeith RakeM.D.   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 EXAM: CT GUIDED BONE MARROW ASPIRATES AND BIOPSY Physician: AStephan Minister HAnselm Pancoast MD MEDICATIONS: None. ANESTHESIA/SEDATION: Moderate (conscious) sedation was employed during this procedure. A total of Versed 171mand fentanyl 100 mcg was administered intravenously at the order of the provider performing the procedure. Total intra-service moderate sedation time: 12 minutes. Patient's level of consciousness and vital signs were monitored continuously by radiology nurse throughout the procedure under the supervision of the provider performing the procedure. COMPLICATIONS: None immediate. PROCEDURE: The procedure was explained to the patient. The risks and benefits of the procedure were discussed and the patient's questions were addressed. Informed consent was obtained from the patient. The patient was placed prone on CT table. Images of the pelvis were obtained. The right side of back was prepped and draped in sterile fashion. The skin and right posterior ilium were anesthetized with 1% lidocaine. 11 gauge bone needle was directed into the right ilium with CT guidance. Two aspirates and two core  biopsies were obtained. Bandage placed over the puncture site. RADIATION DOSE REDUCTION: This exam was performed according to the departmental dose-optimization program which includes automated exposure control, adjustment of the mA and/or kV according to patient size and/or use of iterative reconstruction technique. IMPRESSION: CT guided bone marrow aspiration and core biopsy. Electronically Signed   By: Markus Daft M.D.   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  Hemoglobin 15.5 on 09/20/2018.  Platelet count on 09/20/1998 2155. - He was on aspirin 81 mg at  presentation.  He received 3 units PRBC.  Ferritin was 244 and percent saturation 37.  X32 was 355 and folic acid normal.  Creatinine was normal. - MMA, copper, folic acid and LDH was normal.  SPEP negative. - Serum EPO 140.  Kappa light chains elevated at 32.4 with ratio 1.55. - Bone marrow biopsy on 09/18/2021: Hypercellular marrow with trilineage dyspoiesis and monocytosis.  Dyspoiesis in the granulocytic precursors without apparent late maturation of neutrophils, increased monocytes but no significant blast population.  Increased megakaryocytes with widely separated nuclear lobes.  Erythroid precursors slightly decreased in number with the type ER.  Ring sideroblasts not identified. - Chromosome analysis and MDS FISH panel was normal. 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 - Based on [facility] molecular model, he has 3 points with circulating immature cells, decreased hemoglobin, decreased platelets.  This puts him in high risk, with overall survival 16 months. - Azacitidine started on 10/19/2021.  Required 2 units PRBC after cycle 2, 3 units PRBC after cycle 3. - BMBX (02/25/2022): Hypercellular marrow with 5% blasts on smear.  Blasts were not seen on flow cytometry.  MDS FISH panel was normal.  Cytogenetics was normal.      -Discussed option of increasing azacitidine dose versus adding Revlimid 10 mg 3 weeks on/1 week off.  We have increased azacitidine to 75 mg x 7 days on cycle 5 on 03/22/2022.    Social/family history: - Lives at home by himself.  Son lives in [place].  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  No exposure to chemicals.  Non-smoker. - Brother had colon cancer.  Another brother had?  Stomach cancer.  2 maternal uncles had cancer, type unknown to the patient.   PLAN:  Acute myeloid leukemia: - He has completed 5 cycles of azacitidine. - We reviewed bone marrow biopsy (06/15/2022): Worsening findings with acute myeloid leukemia with blasts approximately 20%.   FLT3 mutation is negative.  AML FISH panel is negative. - Reviewed his labs today.  White count increased to 137.  Hemoglobin is 7.4 and PLT is 14. - We discussed about his new diagnosis and the need to change therapy.  However because of his age and performance status, he is not candidate for any aggressive therapy.  I have discussed with him about best supportive care in the form of hospice.  He is agreeable. - I have also tried to contact his son but could not reach him.  We will try again. - Until he is signed up in hospice, we will continue weekly labs and possible blood transfusion.     Orders placed this encounter:  No orders of the defined types were placed in this encounter.     Derek Jack, MD [place] [phone]

## 2022-06-28 NOTE — Assessment & Plan Note (Signed)
While hospitalized with malaise, myalgias and dyspnea; peak white count was 70,500. Dr. Delton Coombes saw him post discharge from the hospital and felt that he was not a candidate for aggressive therapy.  Hospice was discussed and this will be followed up with his son.

## 2022-06-28 NOTE — Assessment & Plan Note (Signed)
Current albumin is 2.4 and total protein 6.3.  He has generalized muscle wasting and appears almost cachectic.  Clinically severe protein/caloric malnutrition is present in the context of progressive weight loss here at the SNF with weight dropping from 145 down to 139.6 despite protein supplementation. Nutrition to follow.

## 2022-06-29 ENCOUNTER — Inpatient Hospital Stay: Payer: Medicare HMO

## 2022-06-29 ENCOUNTER — Ambulatory Visit: Payer: Medicare HMO

## 2022-06-29 ENCOUNTER — Ambulatory Visit: Payer: Medicare HMO | Admitting: Urology

## 2022-06-29 DIAGNOSIS — C9310 Chronic myelomonocytic leukemia not having achieved remission: Secondary | ICD-10-CM | POA: Diagnosis not present

## 2022-06-29 DIAGNOSIS — D469 Myelodysplastic syndrome, unspecified: Secondary | ICD-10-CM

## 2022-06-29 MED ORDER — SODIUM CHLORIDE 0.9% IV SOLUTION
250.0000 mL | Freq: Once | INTRAVENOUS | Status: AC
  Administered 2022-06-29: 250 mL via INTRAVENOUS

## 2022-06-29 MED ORDER — HEPARIN SOD (PORK) LOCK FLUSH 100 UNIT/ML IV SOLN
500.0000 [IU] | Freq: Every day | INTRAVENOUS | Status: AC | PRN
  Administered 2022-06-29: 500 [IU]

## 2022-06-29 MED ORDER — SODIUM CHLORIDE 0.9% FLUSH
10.0000 mL | INTRAVENOUS | Status: AC | PRN
  Administered 2022-06-29: 10 mL

## 2022-06-29 MED ORDER — DIPHENHYDRAMINE HCL 25 MG PO CAPS
25.0000 mg | ORAL_CAPSULE | Freq: Once | ORAL | Status: AC
  Administered 2022-06-29: 25 mg via ORAL
  Filled 2022-06-29: qty 1

## 2022-06-29 MED ORDER — ACETAMINOPHEN 325 MG PO TABS
650.0000 mg | ORAL_TABLET | Freq: Once | ORAL | Status: AC
  Administered 2022-06-29: 650 mg via ORAL
  Filled 2022-06-29: qty 2

## 2022-06-29 NOTE — Patient Instructions (Signed)
[facility]  Discharge Instructions: Thank you for choosing [facility] to provide your oncology and hematology care.  If you have a lab appointment with the [facility], please come in thru the Main Entrance and check in at the main information desk.  Wear comfortable clothing and clothing appropriate for easy access to any Portacath or PICC line.   We strive to give you quality time with your provider. You may need to reschedule your appointment if you arrive late (15 or more minutes).  Arriving late affects you and other patients whose appointments are after yours.  Also, if you miss three or more appointments without notifying the office, you may be dismissed from the clinic at the provider's discretion.      For prescription refill requests, have your pharmacy contact our office and allow 72 hours for refills to be completed.    Today you received 1 unit of platelets   BELOW ARE SYMPTOMS THAT SHOULD BE REPORTED IMMEDIATELY: *FEVER GREATER THAN 100.4 F (38 C) OR HIGHER *CHILLS OR SWEATING *NAUSEA AND VOMITING THAT IS NOT CONTROLLED WITH YOUR NAUSEA MEDICATION *UNUSUAL SHORTNESS OF BREATH *UNUSUAL BRUISING OR BLEEDING *URINARY PROBLEMS (pain or burning when urinating, or frequent urination) *BOWEL PROBLEMS (unusual diarrhea, constipation, pain near the anus) TENDERNESS IN MOUTH AND THROAT WITH OR WITHOUT PRESENCE OF ULCERS (sore throat, sores in mouth, or a toothache) UNUSUAL RASH, SWELLING OR PAIN  UNUSUAL VAGINAL DISCHARGE OR ITCHING   Items with * indicate a potential emergency and should be followed up as soon as possible or go to the Emergency Department if any problems should occur.  Please show the CHEMOTHERAPY ALERT CARD or IMMUNOTHERAPY ALERT CARD at check-in to the Emergency Department and triage nurse.  Should you have questions after your visit or need to cancel or reschedule your appointment, please contact [facility] [phone]  and follow the prompts.  Office hours are 8:00 a.m. to 4:30 p.m. Monday - Friday. Please note that voicemails left after 4:00 p.m. may not be returned until the following business day.  We are closed weekends and major holidays. You have access to a nurse at all times for urgent questions. Please call the main number to the clinic [phone] and follow the prompts.  For any non-urgent questions, you may also contact your provider using MyChart. We now offer e-Visits for anyone 46 and older to request care online for non-urgent symptoms. For details visit [email or web address].   Also download the MyChart app! Go to the app store, search "MyChart", open the app, select [facility], and log in with your MyChart username and password.  Masks are optional in the cancer centers. If you would like for your care team to wear a mask while they are taking care of you, please let them know. You may have one support person who is at least 86 years old accompany you for your appointments.

## 2022-06-29 NOTE — Progress Notes (Signed)
Pt presents today for 1 unit of platelets per provider's order. Vital signs stable and pt voiced no new complaints at this time.  1 unit of platelets given today per MD orders. Tolerated infusion without adverse affects. Vital signs stable. No complaints at this time. Discharged from clinic via wheelchair in stable condition. Alert and oriented x 3. F/U with [facility] as scheduled.

## 2022-06-29 NOTE — Progress Notes (Signed)
Chaplain engaged in a brief check-in with Dustin Tucker and his family present.  Chaplain offered support and presence.    06/29/22 1100  Clinical Encounter Type  Visited With Patient and family together  Visit Type Initial

## 2022-06-30 ENCOUNTER — Non-Acute Institutional Stay (SKILLED_NURSING_FACILITY): Payer: Medicare HMO | Admitting: Adult Health

## 2022-06-30 ENCOUNTER — Encounter (HOSPITAL_COMMUNITY): Payer: Self-pay | Admitting: Hematology

## 2022-06-30 DIAGNOSIS — C9310 Chronic myelomonocytic leukemia not having achieved remission: Secondary | ICD-10-CM | POA: Diagnosis not present

## 2022-06-30 DIAGNOSIS — I4891 Unspecified atrial fibrillation: Secondary | ICD-10-CM | POA: Diagnosis not present

## 2022-06-30 DIAGNOSIS — D696 Thrombocytopenia, unspecified: Secondary | ICD-10-CM | POA: Diagnosis not present

## 2022-06-30 DIAGNOSIS — N400 Enlarged prostate without lower urinary tract symptoms: Secondary | ICD-10-CM | POA: Insufficient documentation

## 2022-06-30 DIAGNOSIS — K5909 Other constipation: Secondary | ICD-10-CM | POA: Insufficient documentation

## 2022-06-30 LAB — PREPARE PLATELET PHERESIS: Unit division: 0

## 2022-06-30 LAB — BPAM PLATELET PHERESIS
Blood Product Expiration Date: 202311242359
ISSUE DATE / TIME: 202311211005
Unit Type and Rh: 6200

## 2022-07-05 ENCOUNTER — Encounter: Payer: Self-pay | Admitting: Adult Health

## 2022-07-05 ENCOUNTER — Inpatient Hospital Stay: Payer: Medicare HMO

## 2022-07-05 ENCOUNTER — Ambulatory Visit: Payer: Medicare HMO

## 2022-07-05 ENCOUNTER — Ambulatory Visit: Payer: Medicare HMO | Admitting: Hematology

## 2022-07-06 ENCOUNTER — Inpatient Hospital Stay: Payer: Medicare HMO

## 2022-07-06 NOTE — Progress Notes (Signed)
Location:  [facility] Room Number: [place] of Service:  [phone])  Provider: Ok Edwards np   PCP: Jani Gravel, MD Patient Care Team: Jani Gravel, MD as PCP - General (Internal Medicine) Herminio Commons, MD (Inactive) as PCP - Cardiology (Cardiology) Derek Jack, MD as Medical Oncologist (Hematology)  Extended Emergency Contact Information Primary Emergency Contact: Cardarius, Senat Mobile Phone: [phone] Relation: Son  Code Status: dnr  Goals of care:  Advanced Directive information    06/29/2022    9:29 AM  Advanced Directives  Does Patient Have a Medical Advance Directive? Yes  Type of Advance Directive Living will;Healthcare Power of Attorney  Would patient like information on creating a medical advance directive? No - Patient declined     Allergies  Allergen Reactions   Aspirin Other (See Comments)    Gi upset, can tolerate baby aspirin   Flomax [Tamsulosin] Nausea Only    Chief Complaint  Patient presents with   Discharge Note    HPI:  88 y.o. male  in a terminal state. He is being discharged to hospice house for his end of life care. He will follow up with the medical provider at the facility. He had been hospitalized for acute cystitis; has CML. He was unable to fully participate in therapy. His family decided to focus upon his comfort.     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   Hypertension    Port-A-Cath in place 10/15/2021    Past Surgical History:  Procedure Laterality Date   CARPAL TUNNEL RELEASE Right    COLONOSCOPY N/A 09/19/2013   Procedure: COLONOSCOPY;  Surgeon: Rogene Houston, MD;  Location: [facility];  Service: Endoscopy;  Laterality: N/A;  830   FINGER AMPUTATION Left    4th and 5th   left eye blindness Left    PORTACATH PLACEMENT Right 10/16/2021   Procedure: INSERTION PORT-A-CATH;  Surgeon: Aviva Signs, MD;  Location: [facility];  Service: General;  Laterality: Right;   SVT ABLATION N/A 09/21/2018   Procedure: SVT ABLATION;  Surgeon: Constance Haw, MD;  Location: [facility] [facility];  Service: Cardiovascular;  Laterality: N/A;      reports that he quit smoking about 42 years ago. His smoking use included cigarettes. He started smoking about 65 years ago. He smoked an average of .5 packs per day. He has never used smokeless tobacco. He reports that he does not drink alcohol and does not use drugs. Social History   Socioeconomic History   Marital status: Widowed    Spouse name: Not on file   Number of children: Not on file   Years of education: Not on file   Highest education level: Not on file  Occupational History   Not on file  Tobacco Use   Smoking status: Former    Packs/day: 0.50    Types: Cigarettes    Start date: 08/09/1956    Quit date: 08/10/1979    Years since quitting: 42.9   Smokeless tobacco: Never  Vaping Use   Vaping Use: Never used  Substance and Sexual Activity   Alcohol use: No    Alcohol/week: 0.0 standard drinks of alcohol   Drug use: No   Sexual activity: Not Currently  Other Topics Concern   Not on file  Social History Narrative   Not on file   Social Determinants  of Health   Financial Resource Strain: Not on file  Food Insecurity: No Food Insecurity (06/17/2022)   Hunger Vital Sign    Worried About Running Out of Food in the Last Year: Never true    Ran Out of Food in the Last Year: Never true  Transportation Needs: No Transportation Needs (06/17/2022)   PRAPARE - Hydrologist (Medical): No    Lack of Transportation (Non-Medical): No  Physical Activity: Not on file  Stress: Not on file  Social Connections: Not on file  Intimate Partner Violence: Not At Risk (06/17/2022)   Humiliation, Afraid, Rape, and Kick questionnaire    Fear of Current or Ex-Partner: No    Emotionally Abused: No    Physically Abused: No    Sexually Abused: No    Functional Status Survey:    Allergies  Allergen Reactions   Aspirin Other (See Comments)    Gi upset, can tolerate baby aspirin   Flomax [Tamsulosin] Nausea Only    Pertinent  Health Maintenance Due  Topic Date Due   INFLUENZA VACCINE  03/09/2022    Medications: Outpatient Encounter Medications as of 06/30/2022  Medication Sig   acetaminophen (TYLENOL) 325 MG tablet Take 2 tablets (650 mg total) by mouth every 6 (six) hours as needed for mild pain, fever or headache (or Fever >/= 101).   albuterol (VENTOLIN HFA) 108 (90 Base) MCG/ACT inhaler Inhale 2 puffs into the lungs every 6 (six) hours as needed for wheezing or shortness of breath.   alfuzosin (UROXATRAL) 10 MG 24 hr tablet Take 1 tablet (10 mg total) by mouth daily with breakfast.   feeding supplement (ENSURE ENLIVE / ENSURE PLUS) LIQD Take 237 mLs by mouth 2 (two) times daily between meals.   furosemide (LASIX) 20 MG tablet Take 1 tablet (20 mg total) by mouth daily as needed (take as needed every morning for ankle swelling).   levocetirizine (XYZAL) 5 MG tablet Take 5 mg by mouth daily.   megestrol (MEGACE) 400 MG/10ML suspension Take 10 mLs (400 mg total) by mouth 2 (two) times daily.   ondansetron (ZOFRAN) 4 MG tablet Take 1 tablet (4 mg total) by mouth every 6 (six) hours as needed for nausea.   [EXPIRED] oxyCODONE (OXY IR/ROXICODONE) 5 MG immediate release tablet Take 1 tablet (5 mg total) by mouth every 4 (four) hours as needed for up to 6 days for moderate pain.   pantoprazole (PROTONIX) 40 MG tablet Take 1 tablet (40 mg total) by mouth daily.   polyethylene glycol (MIRALAX) 17 g packet Take 17 g by mouth daily.   potassium chloride SA (KLOR-CON M) 20 MEQ tablet Take 1 tablet (20 mEq total) by mouth daily.   senna-docusate (SENOKOT-S) 8.6-50 MG tablet Take 2 tablets by mouth 2 (two) times daily.   Facility-Administered Encounter Medications as of 06/30/2022  Medication   0.9 %  sodium chloride infusion (Manually  program via Guardrails IV Fluids)   acetaminophen (TYLENOL) 325 MG tablet   diphenhydrAMINE (BENADRYL) 25 mg capsule   palonosetron (ALOXI) 0.25 MG/5ML injection     Vitals:   06/30/22 1115  BP: 118/68  Pulse: 70  Resp: 20  Temp: 97.8 F (36.6 C)  SpO2: 97%  Weight: 139 lb 9.6 oz (63.3 kg)   Body mass index is 20.62 kg/m.  Review of Systems  Constitutional:  Positive for malaise/fatigue.  Respiratory:  Negative for cough and shortness of breath.   Cardiovascular:  Negative for chest pain, palpitations  and leg swelling.  Gastrointestinal:  Negative for abdominal pain, constipation and heartburn.  Musculoskeletal:  Negative for back pain, joint pain and myalgias.  Skin: Negative.   Neurological:  Negative for dizziness.  Psychiatric/Behavioral:  The patient is not nervous/anxious.    Physical Exam Constitutional:      General: He is not in acute distress.    Appearance: He is underweight. He is not diaphoretic.  Neck:     Thyroid: No thyromegaly.  Cardiovascular:     Rate and Rhythm: Normal rate and regular rhythm.     Pulses: Normal pulses.     Heart sounds: Normal heart sounds.  Pulmonary:     Effort: Pulmonary effort is normal. No respiratory distress.     Breath sounds: Normal breath sounds.  Abdominal:     General: Bowel sounds are normal. There is no distension.     Palpations: Abdomen is soft.     Tenderness: There is no abdominal tenderness.  Musculoskeletal:        General: Normal range of motion.     Cervical back: Neck supple.     Right lower leg: No edema.     Left lower leg: No edema.  Lymphadenopathy:     Cervical: No cervical adenopathy.  Skin:    General: Skin is warm and dry.     Comments: Portacath   Neurological:     Mental Status: He is alert and oriented to person, place, and time.  Psychiatric:        Mood and Affect: Mood normal.      Assessment/Plan:    Patient is being discharged with the following home health services:   n/a  Patient is being discharged with the following durable medical equipment:  n/a  Patient has been advised to f/u with their PCP in 1-2 weeks to for a transitions of care visit.  Social services at their facility was responsible for arranging this appointment.  Pt was provided with adequate prescriptions of noncontrolled medications to reach the scheduled appointment .  For controlled substances, a limited supply was provided as appropriate for the individual patient.  If the pt normally receives these medications from a pain clinic or has a contract with another physician, these medications should be received from that clinic or physician only).    Hospice will provide any needed medications.   Ok Edwards NP [facility]  call [phone]

## 2022-07-07 ENCOUNTER — Inpatient Hospital Stay: Payer: Medicare HMO

## 2022-07-08 ENCOUNTER — Inpatient Hospital Stay: Payer: Medicare HMO

## 2022-07-09 ENCOUNTER — Inpatient Hospital Stay: Payer: Medicare HMO

## 2022-07-12 ENCOUNTER — Inpatient Hospital Stay: Payer: Medicare HMO

## 2022-07-13 ENCOUNTER — Inpatient Hospital Stay: Payer: Medicare HMO

## 2022-07-15 ENCOUNTER — Other Ambulatory Visit: Payer: Self-pay | Admitting: Hematology

## 2022-07-19 ENCOUNTER — Ambulatory Visit: Payer: Medicare HMO

## 2022-07-19 ENCOUNTER — Other Ambulatory Visit: Payer: Medicare HMO

## 2022-07-20 ENCOUNTER — Ambulatory Visit: Payer: Medicare HMO

## 2022-07-20 ENCOUNTER — Ambulatory Visit: Payer: Medicare HMO | Admitting: Hematology

## 2022-07-21 ENCOUNTER — Ambulatory Visit: Payer: Medicare HMO

## 2022-07-22 ENCOUNTER — Ambulatory Visit: Payer: Medicare HMO

## 2022-07-23 ENCOUNTER — Ambulatory Visit: Payer: Medicare HMO

## 2022-07-26 ENCOUNTER — Other Ambulatory Visit: Payer: Medicare HMO

## 2022-07-26 ENCOUNTER — Ambulatory Visit: Payer: Medicare HMO

## 2022-07-27 ENCOUNTER — Ambulatory Visit: Payer: Medicare HMO

## 2022-08-09 DEATH — deceased

## 2022-11-02 ENCOUNTER — Ambulatory Visit: Payer: Medicare HMO | Admitting: Urology

## 2023-07-21 IMAGING — CR DG CHEST 2V
2 series · 2 of 2 positions shown · non-contrast
Comparison: 09/20/2018

CLINICAL DATA: Shortness of breath, cough

EXAM:
CHEST - 2 VIEW

[w pa chest]
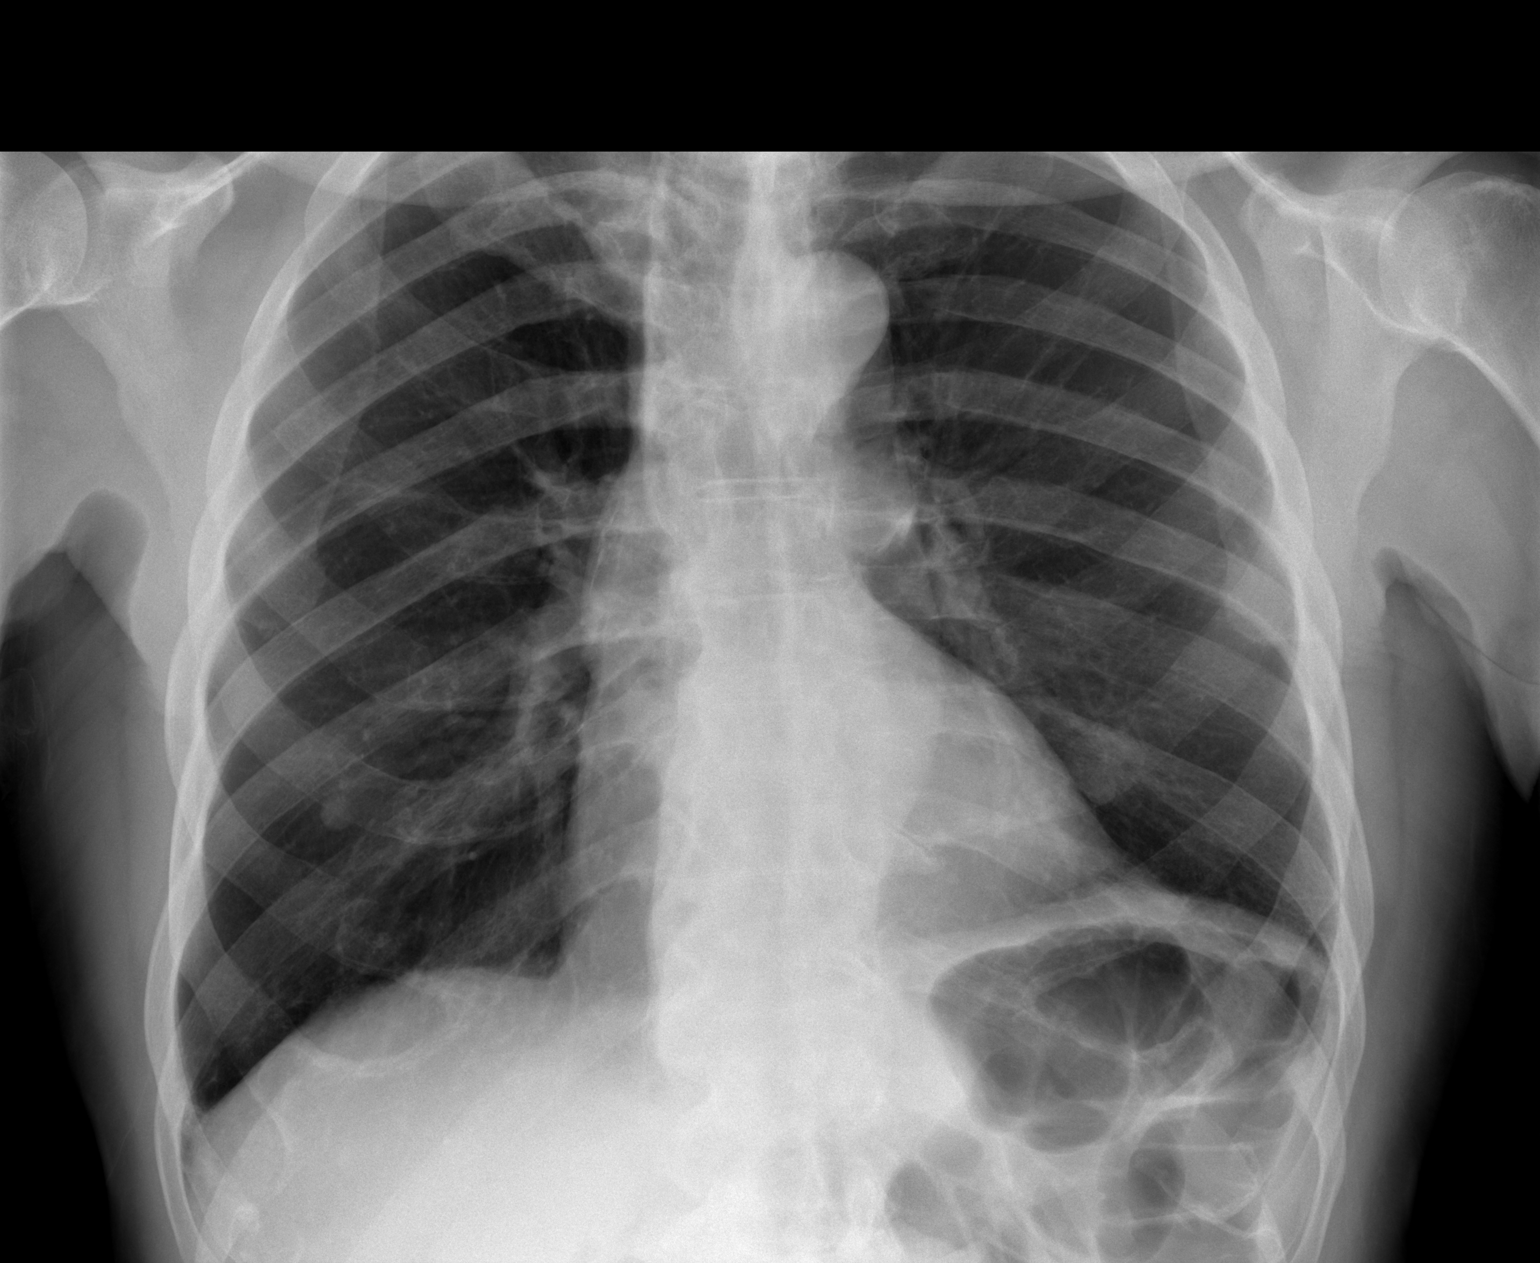

[w chest lat]
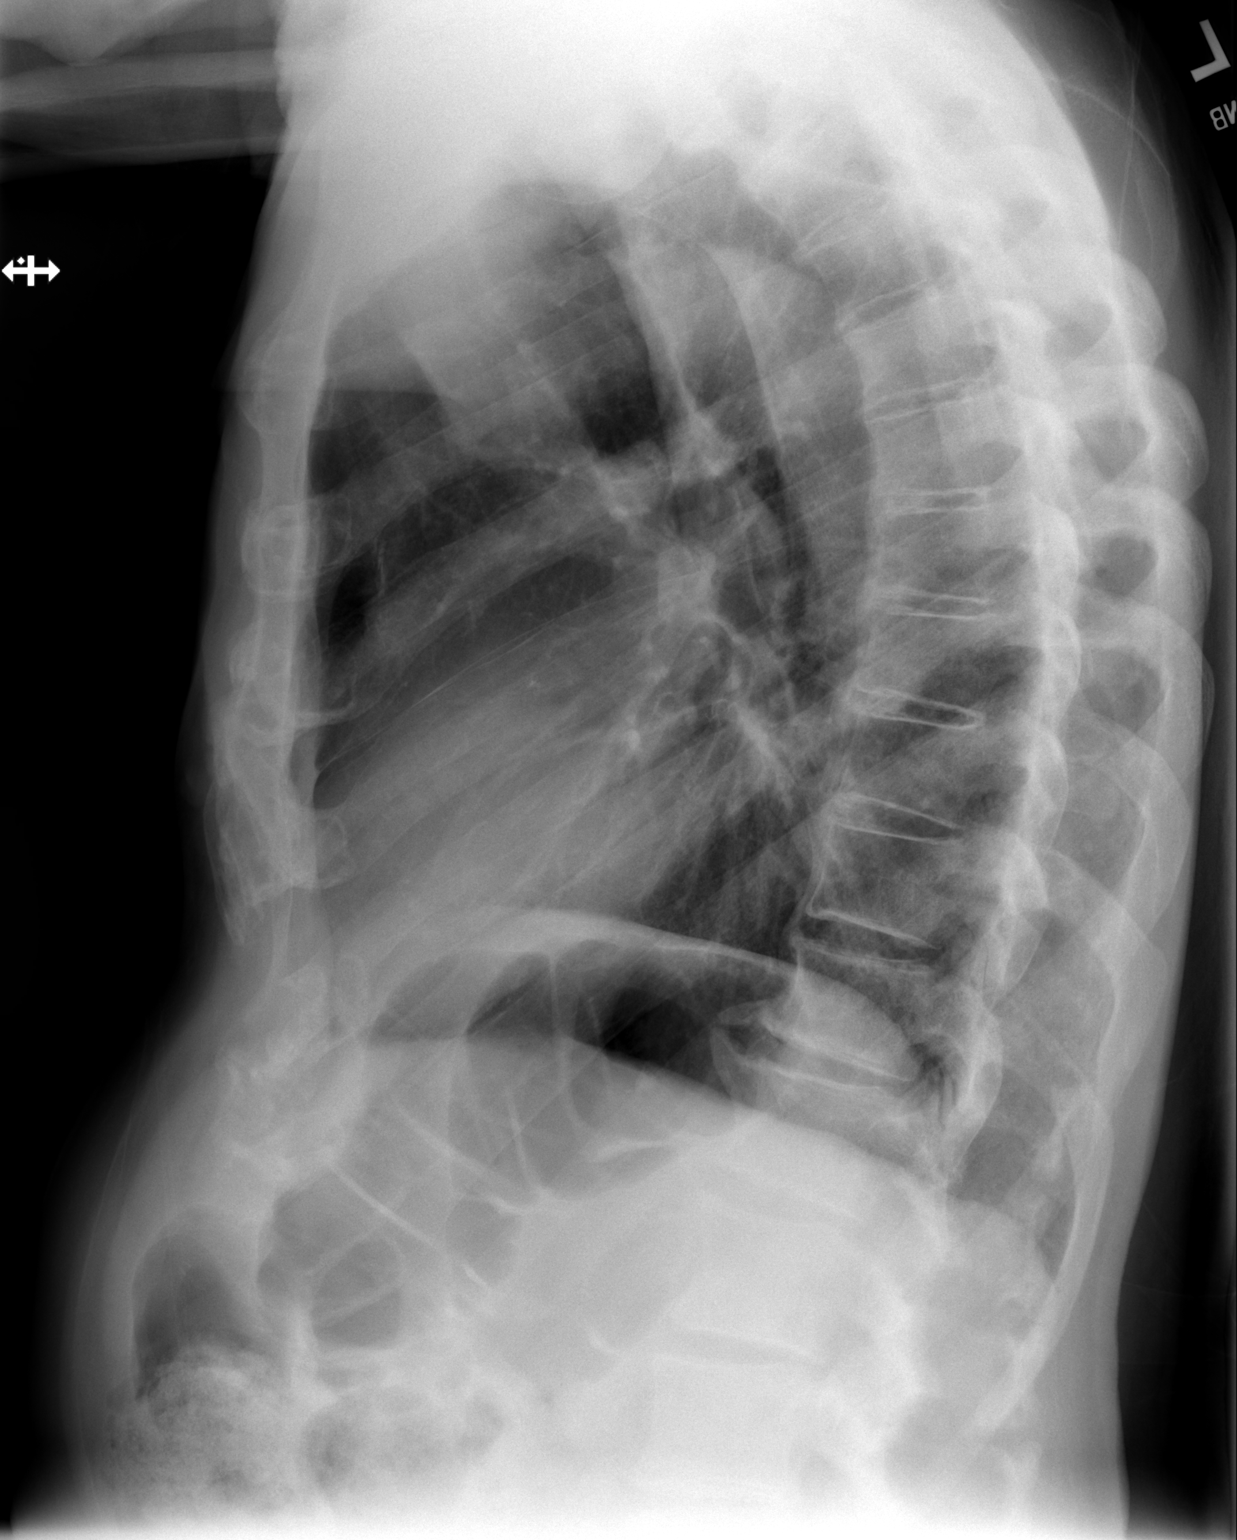

[2 of 2 positions shown; findings below may reference images not displayed]

FINDINGS: The heart size and mediastinal contours are within normal limits.
Unchanged elevation of the left hemidiaphragm. No acute appearing
airspace opacity. Disc degenerative disease and partial ankylosis of
the thoracic spine.
IMPRESSION: Unchanged elevation of the left hemidiaphragm. No acute appearing
airspace opacity.
